# Patient Record
Sex: Male | Born: 1975 | Race: Black or African American | Hispanic: No | Smoking: Never smoker
Health system: Southern US, Community
[De-identification: ages and names within clinical notes are randomized; demographics above are authoritative.]

## PROBLEM LIST (undated history)

## (undated) DIAGNOSIS — E119 Type 2 diabetes mellitus without complications: Secondary | ICD-10-CM

## (undated) DIAGNOSIS — C61 Malignant neoplasm of prostate: Secondary | ICD-10-CM

## (undated) DIAGNOSIS — C801 Malignant (primary) neoplasm, unspecified: Secondary | ICD-10-CM

## (undated) DIAGNOSIS — R319 Hematuria, unspecified: Secondary | ICD-10-CM

## (undated) DIAGNOSIS — I10 Essential (primary) hypertension: Secondary | ICD-10-CM

## (undated) DIAGNOSIS — N189 Chronic kidney disease, unspecified: Secondary | ICD-10-CM

## (undated) DIAGNOSIS — D649 Anemia, unspecified: Secondary | ICD-10-CM

## (undated) DIAGNOSIS — Z87442 Personal history of urinary calculi: Secondary | ICD-10-CM

## (undated) DIAGNOSIS — T7840XA Allergy, unspecified, initial encounter: Secondary | ICD-10-CM

## (undated) DIAGNOSIS — G4730 Sleep apnea, unspecified: Secondary | ICD-10-CM

## (undated) HISTORY — DX: Malignant (primary) neoplasm, unspecified: C80.1

## (undated) HISTORY — DX: Hematuria, unspecified: R31.9

## (undated) HISTORY — DX: Essential (primary) hypertension: I10

## (undated) HISTORY — DX: Allergy, unspecified, initial encounter: T78.40XA

## (undated) HISTORY — DX: Type 2 diabetes mellitus without complications: E11.9

## (undated) HISTORY — PX: SMALL INTESTINE SURGERY: SHX150

## (undated) HISTORY — DX: Malignant neoplasm of prostate: C61

---

## 2003-08-11 HISTORY — PX: PARTIAL COLECTOMY: SHX5273

## 2009-01-21 ENCOUNTER — Ambulatory Visit: Payer: Self-pay | Admitting: Internal Medicine

## 2009-01-27 ENCOUNTER — Ambulatory Visit: Payer: Self-pay | Admitting: Family Medicine

## 2011-02-12 ENCOUNTER — Ambulatory Visit: Payer: Self-pay | Admitting: Internal Medicine

## 2012-02-10 ENCOUNTER — Ambulatory Visit: Payer: Self-pay

## 2012-02-10 LAB — URINALYSIS, COMPLETE
Glucose,UR: NEGATIVE mg/dL (ref 0–75)
Ketone: NEGATIVE
Nitrite: NEGATIVE
Protein: 300
Specific Gravity: >=1.03 (ref 1.003–1.030)
Squamous Epithelial: NONE SEEN

## 2012-02-10 LAB — CBC WITH DIFFERENTIAL/PLATELET
Basophil #: 0.1 10*3/uL (ref 0.0–0.1)
Basophil %: 1.1 %
Eosinophil #: 0.5 10*3/uL (ref 0.0–0.7)
HCT: 43 % (ref 40.0–52.0)
HGB: 14.3 g/dL (ref 13.0–18.0)
Lymphocyte #: 2.2 10*3/uL (ref 1.0–3.6)
Lymphocyte %: 20.1 %
MCV: 87 fL (ref 80–100)
Monocyte #: 1 x10 3/mm (ref 0.2–1.0)
Monocyte %: 8.6 %
Neutrophil %: 65.3 %
WBC: 11.2 10*3/uL — ABNORMAL HIGH (ref 3.8–10.6)

## 2012-02-10 LAB — BASIC METABOLIC PANEL
BUN: 11 mg/dL (ref 7–18)
Creatinine: 1.17 mg/dL (ref 0.60–1.30)
EGFR (Non-African Amer.): >60
Glucose: 127 mg/dL — ABNORMAL HIGH (ref 65–99)
Potassium: 3.8 mmol/L (ref 3.5–5.1)
Sodium: 143 mmol/L (ref 136–145)

## 2012-02-12 LAB — URINE CULTURE

## 2012-11-23 ENCOUNTER — Ambulatory Visit: Payer: Self-pay | Admitting: Family Medicine

## 2012-11-23 LAB — URINALYSIS, COMPLETE
Glucose,UR: NEGATIVE mg/dL (ref 0–75)
Ketone: NEGATIVE
Ph: 7.5 (ref 4.5–8.0)

## 2012-11-24 LAB — URINE CULTURE

## 2014-02-04 ENCOUNTER — Ambulatory Visit: Payer: Self-pay

## 2014-02-08 ENCOUNTER — Ambulatory Visit: Payer: Self-pay

## 2014-02-19 ENCOUNTER — Ambulatory Visit: Payer: Self-pay

## 2014-06-08 ENCOUNTER — Ambulatory Visit: Payer: Self-pay | Admitting: Internal Medicine

## 2014-06-09 ENCOUNTER — Ambulatory Visit: Payer: Self-pay | Admitting: Physician Assistant

## 2014-06-10 ENCOUNTER — Ambulatory Visit: Payer: Self-pay | Admitting: Internal Medicine

## 2014-09-10 LAB — HM DIABETES EYE EXAM

## 2014-09-25 LAB — BASIC METABOLIC PANEL
BUN: 13 mg/dL (ref 4–21)
CREATININE: 1 mg/dL (ref <=1.3)

## 2014-09-25 LAB — HEMOGLOBIN A1C: HEMOGLOBIN A1C: 6.8 % — AB (ref 4.0–6.0)

## 2014-10-09 ENCOUNTER — Ambulatory Visit
Admit: 2014-10-09 | Disposition: A | Discharge status: Home / Self Care | Payer: Self-pay | Attending: Internal Medicine | Admitting: Internal Medicine

## 2014-11-09 ENCOUNTER — Ambulatory Visit
Admit: 2014-11-09 | Disposition: A | Discharge status: Home / Self Care | Payer: Self-pay | Attending: Internal Medicine | Admitting: Internal Medicine

## 2014-12-04 DIAGNOSIS — I10 Essential (primary) hypertension: Secondary | ICD-10-CM

## 2014-12-04 DIAGNOSIS — N183 Chronic kidney disease, stage 3 (moderate): Secondary | ICD-10-CM

## 2014-12-04 DIAGNOSIS — N029 Recurrent and persistent hematuria with unspecified morphologic changes: Secondary | ICD-10-CM | POA: Insufficient documentation

## 2014-12-04 DIAGNOSIS — R319 Hematuria, unspecified: Secondary | ICD-10-CM

## 2014-12-04 DIAGNOSIS — E119 Type 2 diabetes mellitus without complications: Secondary | ICD-10-CM | POA: Insufficient documentation

## 2014-12-04 DIAGNOSIS — Z6835 Body mass index (BMI) 35.0-35.9, adult: Secondary | ICD-10-CM | POA: Insufficient documentation

## 2014-12-04 HISTORY — DX: Recurrent and persistent hematuria with unspecified morphologic changes: N02.9

## 2014-12-04 HISTORY — DX: Essential (primary) hypertension: I10

## 2015-02-06 ENCOUNTER — Encounter: Payer: Self-pay | Admitting: Internal Medicine

## 2015-03-25 ENCOUNTER — Other Ambulatory Visit: Payer: Self-pay | Admitting: Internal Medicine

## 2015-04-17 ENCOUNTER — Encounter: Payer: Self-pay | Admitting: Internal Medicine

## 2015-04-27 ENCOUNTER — Other Ambulatory Visit: Payer: Self-pay | Admitting: Internal Medicine

## 2015-05-14 ENCOUNTER — Ambulatory Visit: Payer: 59

## 2015-05-14 ENCOUNTER — Encounter: Payer: Self-pay | Admitting: Emergency Medicine

## 2015-05-14 ENCOUNTER — Ambulatory Visit
Admission: EM | Admit: 2015-05-14 | Discharge: 2015-05-14 | Disposition: A | Discharge status: Home / Self Care | Payer: 59 | Attending: Family Medicine | Admitting: Family Medicine

## 2015-05-14 DIAGNOSIS — M13121 Monoarthritis, not elsewhere classified, right elbow: Secondary | ICD-10-CM | POA: Diagnosis not present

## 2015-05-14 DIAGNOSIS — M009 Pyogenic arthritis, unspecified: Secondary | ICD-10-CM

## 2015-05-14 LAB — CBC WITH DIFFERENTIAL/PLATELET
Basophils Absolute: 0.2 10*3/uL — ABNORMAL HIGH (ref 0–0.1)
Basophils Relative: 2 %
Eosinophils Absolute: 0.4 10*3/uL (ref 0–0.7)
Eosinophils Relative: 3 %
HCT: 42.8 % (ref 40.0–52.0)
Hemoglobin: 14.6 g/dL (ref 13.0–18.0)
Lymphocytes Relative: 15 %
Lymphs Abs: 2.2 10*3/uL (ref 1.0–3.6)
MCH: 28.4 pg (ref 26.0–34.0)
MCHC: 34 g/dL (ref 32.0–36.0)
MCV: 83.5 fL (ref 80.0–100.0)
Monocytes Absolute: 1.6 10*3/uL — ABNORMAL HIGH (ref 0.2–1.0)
Monocytes Relative: 11 %
Neutro Abs: 10.3 10*3/uL — ABNORMAL HIGH (ref 1.4–6.5)
Neutrophils Relative %: 69 %
Platelets: 351 10*3/uL (ref 150–440)
RBC: 5.13 MIL/uL (ref 4.40–5.90)
RDW: 13.7 % (ref 11.5–14.5)
WBC: 14.7 10*3/uL — ABNORMAL HIGH (ref 3.8–10.6)

## 2015-05-14 LAB — SEDIMENTATION RATE: Sed Rate: 65 mm/hr — ABNORMAL HIGH (ref 0–15)

## 2015-05-14 MED ORDER — NAPROXEN 500 MG PO TABS: 500.0000 mg | ORAL_TABLET | Freq: Two times a day (BID) | ORAL | Status: DC

## 2015-05-14 NOTE — ED Notes (Signed)
Pt wioth right elbow pain x 3 days

## 2015-05-14 NOTE — ED Notes (Signed)
Pt with right elbow pain x x 3 days

## 2015-05-14 NOTE — ED Provider Notes (Signed)
CSN: [id number]     Arrival date & time 05/14/15  0940 History   First MD Initiated Contact with Patient 05/14/15 1002     Chief Complaint  Patient presents with  . Elbow Pain   (Consider location/radiation/quality/duration/timing/severity/associated sxs/prior Treatment) HPI  This a 39 year old male who presents with right (dominant) elbow pain that he indicates over entire elbow. He states that he noticed that he was unable to move his elbow through normal range of motion had previous episodes of tendinitis that took about a week to resolve and improve. He denies any recent change in activity that may account for his pain. He has no breaks in his skin that are evident. He has not had any chills or fever but has a mild elevation of his temperature to 99  today. He is obese and is a diabetic.       History reviewed. No pertinent past medical history. Past Surgical History  Procedure Laterality Date  . Partial colectomy  2005   Family History  Problem Relation Age of Onset  . Diabetes Mother   . Heart disease Mother   . Hypertension Mother   . Diabetes Maternal Grandmother   . Diabetes Maternal Grandfather    Social History  Substance Use Topics  . Smoking status: Never Smoker   . Smokeless tobacco: None  . Alcohol Use: None    Review of Systems  Constitutional: Positive for activity change.  Musculoskeletal: Positive for joint swelling and arthralgias.  All other systems reviewed and are negative.   Allergies  Review of patient's allergies indicates no known allergies.  Home Medications   Prior to Admission medications   Medication Sig Start Date End Date Taking? Authorizing Provider  amLODipine (NORVASC) 10 MG tablet Take 1 tablet by mouth daily. 09/25/14   Historical Provider, MD  atorvastatin (LIPITOR) 10 MG tablet Take 1 tablet by mouth at bedtime. 09/25/14   Historical Provider, MD  hydrochlorothiazide (HYDRODIURIL) 25 MG tablet Take 2 tablets by mouth daily.  07/22/14   Historical Provider, MD  ibuprofen (ADVIL,MOTRIN) 800 MG tablet Take 1 tablet by mouth 3 (three) times daily as needed.    Historical Provider, MD  irbesartan (AVAPRO) 300 MG tablet Take 1 tablet by mouth daily. 09/25/14   Historical Provider, MD  metFORMIN (GLUCOPHAGE) 500 MG tablet TAKE ONE TABLET BY MOUTH ONCE DAILY 04/28/15   Glean Hess, MD  naproxen (NAPROSYN) 500 MG tablet Take 1 tablet (500 mg total) by mouth 2 (two) times daily with a meal. 05/14/15   Lorin Picket, PA-C   Meds Ordered and Administered this Visit  Medications - No data to display  BP 146/80 mmHg  Pulse 93  Temp(Src) 99.2 F (37.3 C) (Tympanic)  Resp 18  Ht 5' 11.5" (1.816 m)  Wt 412 lb (186.882 kg)  BMI 56.67 kg/m2  SpO2 100% No data found.   Physical Exam  Constitutional: He is oriented to person, place, and time. He appears well-developed and well-nourished.  HENT:  Head: Normocephalic and atraumatic.  Eyes: Pupils are equal, round, and reactive to light.  Neck: Neck supple.  Musculoskeletal: He exhibits edema and tenderness.  Examination of the right elbow shows swelling comparison to the left. Range of motion is very limited from -45 extension to 90 of flexion but comfortable within that range. Pronation supination are full. There is warmth to palpation over the elbow mostly posterolateral. There appears to be tenderness over the lateral and posterior elbow but not severe. Lifting weight  with the palm in pronation and resisted wrist extension do not reproduce his pain.  Neurological: He is alert and oriented to person, place, and time.  Skin: Skin is warm and dry. No rash noted.  Psychiatric: He has a normal mood and affect. His behavior is normal. Judgment and thought content normal.  Nursing note and vitals reviewed.   ED Course  Procedures (including critical care time)  Labs Review Labs Reviewed  CBC WITH DIFFERENTIAL/PLATELET - Abnormal; Notable for the following:    WBC  14.7 (*)    Neutro Abs 10.3 (*)    Monocytes Absolute 1.6 (*)    Basophils Absolute 0.2 (*)    All other components within normal limits  SEDIMENTATION RATE - Abnormal; Notable for the following:    Sed Rate 65 (*)    All other components within normal limits    Imaging Review Dg Elbow Complete Right  05/14/2015   CLINICAL DATA:  Elbow pain and limited range of motion for 2 days. No known injury.  EXAM: RIGHT ELBOW - COMPLETE 3+ VIEW  COMPARISON:  None.  FINDINGS: There is no evidence of fracture, dislocation, or joint effusion. There is no evidence of arthropathy or other focal bone abnormality. Mild soft tissue swelling is seen along the posterior and ulnar aspect of the elbow joint.  IMPRESSION: Mild soft tissue swelling. No osseous abnormality or elbow joint effusion identified.   Electronically Signed   By: Earle Gell M.D.   On: 05/14/2015 10:50     Visual Acuity Review  Right Eye Distance:   Left Eye Distance:   Bilateral Distance:    Right Eye Near:   Left Eye Near:    Bilateral Near:         MDM   1. Inflammatory monoarthritis of right elbow   2. Pyogenic arthritis of right elbow, due to unspecified organism [facility])    Discharge Medication List as of 05/14/2015 11:50 AM    START taking these medications   Details  naproxen (NAPROSYN) 500 MG tablet Take 1 tablet (500 mg total) by mouth 2 (two) times daily with a meal., Starting 05/14/2015, Until Discontinued, Print      Plan: 1. Test/x-ray results and diagnosis reviewed with patient 2. rx as per orders; risks, benefits, potential side effects reviewed with patient 3. Recommend supportive treatment with rest,elevation. 4. F/u Dr. Tamala Julian at [facility] tomorrow at 9:00 AM.  I have told patient that from my examination and from the laboratory studies he quite possibly may have a septic arthritis of the right elbow. Unable to find another source for his elevated white count and low-grade temperature. I  recommended he be seen by orthopedic surgeon who may decide to aspirate the joint for fluid. Therefore I will not put him on any antibiotics pending the study. I will give him some Naprosyn for tonight advised him to elevate and I and ice the elbow. He was given a copy of his x-ray and a copy of the laboratory performed here today. I will also attempt to fax a note over to Dr. Tamala Julian for the exam tomorrow.   Lorin Picket, PA-C 05/14/15 1201

## 2015-05-29 ENCOUNTER — Other Ambulatory Visit: Payer: Self-pay | Admitting: Internal Medicine

## 2015-07-16 ENCOUNTER — Encounter: Payer: Self-pay | Admitting: Internal Medicine

## 2015-07-16 ENCOUNTER — Ambulatory Visit (INDEPENDENT_AMBULATORY_CARE_PROVIDER_SITE_OTHER): Payer: 59 | Admitting: Internal Medicine

## 2015-07-16 VITALS — BP 134/86 | HR 92 | Ht 71.5 in | Wt >= 6400 oz

## 2015-07-16 DIAGNOSIS — Z125 Encounter for screening for malignant neoplasm of prostate: Secondary | ICD-10-CM | POA: Diagnosis not present

## 2015-07-16 DIAGNOSIS — Z114 Encounter for screening for human immunodeficiency virus [HIV]: Secondary | ICD-10-CM

## 2015-07-16 DIAGNOSIS — I10 Essential (primary) hypertension: Secondary | ICD-10-CM

## 2015-07-16 DIAGNOSIS — E119 Type 2 diabetes mellitus without complications: Secondary | ICD-10-CM | POA: Diagnosis not present

## 2015-07-16 DIAGNOSIS — E782 Mixed hyperlipidemia: Secondary | ICD-10-CM | POA: Insufficient documentation

## 2015-07-16 DIAGNOSIS — N029 Recurrent and persistent hematuria with unspecified morphologic changes: Secondary | ICD-10-CM

## 2015-07-16 DIAGNOSIS — E785 Hyperlipidemia, unspecified: Secondary | ICD-10-CM

## 2015-07-16 DIAGNOSIS — R319 Hematuria, unspecified: Secondary | ICD-10-CM

## 2015-07-16 DIAGNOSIS — E1169 Type 2 diabetes mellitus with other specified complication: Secondary | ICD-10-CM | POA: Diagnosis not present

## 2015-07-16 DIAGNOSIS — E118 Type 2 diabetes mellitus with unspecified complications: Secondary | ICD-10-CM | POA: Insufficient documentation

## 2015-07-16 DIAGNOSIS — E1122 Type 2 diabetes mellitus with diabetic chronic kidney disease: Secondary | ICD-10-CM | POA: Insufficient documentation

## 2015-07-16 DIAGNOSIS — Z0000 Encounter for general adult medical examination without abnormal findings: Secondary | ICD-10-CM | POA: Diagnosis not present

## 2015-07-16 DIAGNOSIS — N183 Chronic kidney disease, stage 3 (moderate): Secondary | ICD-10-CM

## 2015-07-16 DIAGNOSIS — I129 Hypertensive chronic kidney disease with stage 1 through stage 4 chronic kidney disease, or unspecified chronic kidney disease: Secondary | ICD-10-CM

## 2015-07-16 HISTORY — DX: Type 2 diabetes mellitus without complications: E11.9

## 2015-07-16 LAB — POCT URINALYSIS DIPSTICK
Bilirubin, UA: NEGATIVE
GLUCOSE UA: NEGATIVE
KETONES UA: NEGATIVE
Leukocytes, UA: NEGATIVE
NITRITE UA: NEGATIVE
SPEC GRAV UA: 1.02
Urobilinogen, UA: 0.2
pH, UA: 5

## 2015-07-16 NOTE — Progress Notes (Signed)
Date:  07/16/2015   Name:  Benjamin Stewart   DOB:  [birth year]   MRN:  [id number]   Chief Complaint: Annual Exam; Hypertension; and Diabetes Benjamin Stewart is a 38 y.o. male who presents today for his Complete Annual Exam. He feels fairly well. He reports exercising none. He reports he is sleeping well. He awakes feeling refreshed - has adapted well to third shift work.  Hypertension This is a chronic problem. The current episode started more than 1 year ago. The problem has been waxing and waning since onset. The problem is uncontrolled. Pertinent negatives include no chest pain, headaches, palpitations or shortness of breath. Risk factors for coronary artery disease include diabetes mellitus. Past treatments include angiotensin blockers, calcium channel blockers and diuretics. There is no history of retinopathy.  Diabetes He presents for his follow-up diabetic visit. He has type 2 diabetes mellitus. Pertinent negatives for hypoglycemia include no dizziness or headaches. Pertinent negatives for diabetes include no chest pain, no fatigue, no foot paresthesias, no foot ulcerations, no polydipsia and no polyuria. Symptoms are stable. Pertinent negatives for diabetic complications include no nephropathy, peripheral neuropathy or retinopathy. Risk factors for coronary artery disease include diabetes mellitus, dyslipidemia, hypertension, male sex and obesity. Current diabetic treatment includes oral agent (monotherapy). His breakfast blood glucose is taken between 6-7 am. His breakfast blood glucose range is generally 130-140 mg/dl. (Works third shift) Eye exam is current.  Hyperlipidemia This is a chronic problem. The current episode started more than 1 year ago. Exacerbating diseases include diabetes and obesity. Pertinent negatives include no chest pain, myalgias or shortness of breath. Current antihyperlipidemic treatment includes statins. The current treatment provides significant improvement of lipids.  Compliance problems include adherence to exercise and adherence to diet.      Review of Systems  Constitutional: Negative for chills, diaphoresis and fatigue.  HENT: Negative for hearing loss, tinnitus and trouble swallowing.   Eyes: Negative for visual disturbance.  Respiratory: Negative for chest tightness, shortness of breath and wheezing.   Cardiovascular: Positive for leg swelling. Negative for chest pain and palpitations.  Gastrointestinal: Negative for abdominal pain, constipation and blood in stool.  Endocrine: Negative for polydipsia and polyuria.  Genitourinary: Positive for hematuria. Negative for dysuria, urgency, frequency and discharge.  Musculoskeletal: Positive for arthralgias (recent elbow bursitis). Negative for myalgias, joint swelling and gait problem.  Skin: Negative for rash.  Neurological: Negative for dizziness and headaches.  Psychiatric/Behavioral: Negative for sleep disturbance and dysphoric mood.    Patient Active Problem List   Diagnosis Date Noted  . Controlled type 2 diabetes mellitus without complication, without long-term current use of insulin ([place]) 07/16/2015  . Hyperlipidemia associated with type 2 diabetes mellitus ([place]) 07/16/2015  . Acute gouty arthropathy 12/04/2014  . Benign essential hematuria 12/04/2014  . Essential (primary) hypertension 12/04/2014  . Extreme obesity ([place]) 12/04/2014  . Impaired renal function 12/04/2014    Prior to Admission medications   Medication Sig Start Date End Date Taking? Authorizing Provider  amLODipine (NORVASC) 10 MG tablet TAKE ONE TABLET BY MOUTH ONCE DAILY 05/29/15   Glean Hess, MD  atorvastatin (LIPITOR) 10 MG tablet Take 1 tablet by mouth at bedtime. 09/25/14   Historical Provider, MD  hydrochlorothiazide (HYDRODIURIL) 25 MG tablet Take 2 tablets by mouth daily. 07/22/14   Historical Provider, MD  ibuprofen (ADVIL,MOTRIN) 800 MG tablet Take 1 tablet by mouth 3 (three) times daily as needed.     Historical Provider, MD  irbesartan (AVAPRO) 300 MG tablet  TAKE ONE TABLET BY MOUTH ONCE DAILY 05/29/15   Glean Hess, MD  meloxicam [facility]) 15 MG tablet  05/15/15   Historical Provider, MD  metFORMIN (GLUCOPHAGE) 500 MG tablet TAKE ONE TABLET BY MOUTH ONCE DAILY 04/28/15   Glean Hess, MD  naproxen (NAPROSYN) 500 MG tablet Take 1 tablet (500 mg total) by mouth 2 (two) times daily with a meal. 05/14/15   Lorin Picket, PA-C    No Known Allergies  Past Surgical History  Procedure Laterality Date  . Partial colectomy  2005    Social History  Substance Use Topics  . Smoking status: Never Smoker   . Smokeless tobacco: None  . Alcohol Use: None     Medication list has been reviewed and updated.  Physical Exam  Constitutional: He is oriented to person, place, and time. He appears well-developed and well-nourished.  HENT:  Head: Normocephalic.  Right Ear: Tympanic membrane, external ear and ear canal normal.  Left Ear: Tympanic membrane, external ear and ear canal normal.  Nose: Nose normal.  Mouth/Throat: Uvula is midline and oropharynx is clear and moist. No tonsillar abscesses.  Eyes: Conjunctivae and EOM are normal. Pupils are equal, round, and reactive to light.  Neck: Normal range of motion. Neck supple. Carotid bruit is not present. No thyromegaly present.  Cardiovascular: Normal rate, regular rhythm, normal heart sounds and intact distal pulses.   Pulmonary/Chest: Effort normal and breath sounds normal. He has no wheezes. Right breast exhibits no mass. Left breast exhibits no mass.  Abdominal: Soft. Normal appearance and bowel sounds are normal. There is no hepatosplenomegaly. There is no tenderness.  Musculoskeletal: Normal range of motion.       Right elbow: He exhibits no swelling. No tenderness found.  Lymphadenopathy:    He has no cervical adenopathy.  Neurological: He is alert and oriented to person, place, and time. He has normal reflexes. No sensory  deficit.  Foot exam normal - intact skin, nails, pulses and sensation   Skin: Skin is warm, dry and intact.  Psychiatric: He has a normal mood and affect. His speech is normal and behavior is normal. Judgment and thought content normal.  Nursing note and vitals reviewed.   BP 134/86 mmHg  Pulse 92  Ht 5' 11.5" (1.816 m)  Wt 414 lb 12.8 oz (188.152 kg)  BMI 57.05 kg/m2  Assessment and Plan: 1. Annual physical exam Normal exam except for weight Discussed exercise and healthy eating habits - POCT urinalysis dipstick  2. Essential (primary) hypertension Controlled - CBC with Differential/Platelet - Comprehensive metabolic panel  3. Controlled type 2 diabetes mellitus without complication, without long-term current use of insulin (HCC) Adjust dose if needed - Microalbumin / creatinine urine ratio - TSH - Hemoglobin A1c  4. Morbid obesity, unspecified obesity type [facility]) Patient has had diabetic education; he is aware of eating habits that can be approved upon  5. Benign essential hematuria Full workup negative in the past Current microscopic exam positive for intact RBCs, and amorphous material; no bacteria  6. Hyperlipidemia associated with type 2 diabetes mellitus ([place]) On appropriate statin therapy - Lipid panel  7. Prostate cancer screening DRE deferred due to lack of symptoms - PSA  8. Encounter for screening for HIV - HIV antibody   Halina Maidens, MD [facility]  07/16/2015

## 2015-07-17 ENCOUNTER — Other Ambulatory Visit: Payer: Self-pay | Admitting: Internal Medicine

## 2015-07-17 DIAGNOSIS — R972 Elevated prostate specific antigen [PSA]: Secondary | ICD-10-CM

## 2015-07-17 LAB — CBC WITH DIFFERENTIAL/PLATELET
BASOS ABS: 0.1 10*3/uL (ref 0.0–0.2)
Basos: 1 %
EOS (ABSOLUTE): 0.3 10*3/uL (ref 0.0–0.4)
EOS: 2 %
HEMOGLOBIN: 13.4 g/dL (ref 12.6–17.7)
Hematocrit: 40.9 % (ref 37.5–51.0)
IMMATURE GRANULOCYTES: 0 %
Immature Grans (Abs): 0 10*3/uL (ref 0.0–0.1)
LYMPHS ABS: 2.5 10*3/uL (ref 0.7–3.1)
LYMPHS: 19 %
MCH: 28.3 pg (ref 26.6–33.0)
MCHC: 32.8 g/dL (ref 31.5–35.7)
MCV: 87 fL (ref 79–97)
MONOCYTES: 9 %
Monocytes Absolute: 1.1 10*3/uL — ABNORMAL HIGH (ref 0.1–0.9)
NEUTROS PCT: 69 %
Neutrophils Absolute: 9.1 10*3/uL — ABNORMAL HIGH (ref 1.4–7.0)
Platelets: 373 10*3/uL (ref 150–379)
RBC: 4.73 x10E6/uL (ref 4.14–5.80)
RDW: 14.1 % (ref 12.3–15.4)
WBC: 13.1 10*3/uL — AB (ref 3.4–10.8)

## 2015-07-17 LAB — COMPREHENSIVE METABOLIC PANEL
ALK PHOS: 59 IU/L (ref 39–117)
ALT: 16 IU/L (ref 0–44)
AST: 19 IU/L (ref 0–40)
Albumin/Globulin Ratio: 1.1 (ref 1.1–2.5)
Albumin: 3.5 g/dL (ref 3.5–5.5)
BUN/Creatinine Ratio: 14 (ref 9–20)
BUN: 27 mg/dL — ABNORMAL HIGH (ref 6–24)
Bilirubin Total: 0.7 mg/dL (ref 0.0–1.2)
CALCIUM: 9.1 mg/dL (ref 8.7–10.2)
CHLORIDE: 98 mmol/L (ref 97–106)
CO2: 26 mmol/L (ref 18–29)
CREATININE: 1.89 mg/dL — AB (ref 0.76–1.27)
GFR calc Af Amer: 50 mL/min/{1.73_m2} — ABNORMAL LOW (ref >59)
GFR calc non Af Amer: 43 mL/min/{1.73_m2} — ABNORMAL LOW (ref >59)
GLUCOSE: 118 mg/dL — AB (ref 65–99)
Globulin, Total: 3.2 g/dL (ref 1.5–4.5)
POTASSIUM: 4.1 mmol/L (ref 3.5–5.2)
SODIUM: 140 mmol/L (ref 136–144)
Total Protein: 6.7 g/dL (ref 6.0–8.5)

## 2015-07-17 LAB — MICROALBUMIN / CREATININE URINE RATIO
Creatinine, Urine: 209.7 mg/dL
MICROALB/CREAT RATIO: 329.5 mg/g{creat} — AB (ref 0.0–30.0)
Microalbumin, Urine: 691 ug/mL

## 2015-07-17 LAB — LIPID PANEL
CHOLESTEROL TOTAL: 138 mg/dL (ref 100–199)
Chol/HDL Ratio: 3.9 ratio units (ref 0.0–5.0)
HDL: 35 mg/dL — ABNORMAL LOW (ref >39)
LDL Calculated: 81 mg/dL (ref 0–99)
TRIGLYCERIDES: 110 mg/dL (ref 0–149)
VLDL Cholesterol Cal: 22 mg/dL (ref 5–40)

## 2015-07-17 LAB — TSH: TSH: 2.48 u[IU]/mL (ref 0.450–4.500)

## 2015-07-17 LAB — PSA: PROSTATE SPECIFIC AG, SERUM: 7.6 ng/mL — AB (ref 0.0–4.0)

## 2015-07-17 LAB — HEMOGLOBIN A1C
ESTIMATED AVERAGE GLUCOSE: 169 mg/dL
HEMOGLOBIN A1C: 7.5 % — AB (ref 4.8–5.6)

## 2015-07-17 LAB — HIV ANTIBODY (ROUTINE TESTING W REFLEX): HIV Screen 4th Generation wRfx: NONREACTIVE

## 2015-07-17 MED ORDER — CIPROFLOXACIN HCL 250 MG PO TABS: 250.0000 mg | ORAL_TABLET | Freq: Two times a day (BID) | ORAL | Status: DC

## 2015-08-06 ENCOUNTER — Other Ambulatory Visit: Payer: Self-pay

## 2015-08-06 ENCOUNTER — Other Ambulatory Visit: Payer: Self-pay | Admitting: Internal Medicine

## 2015-08-06 DIAGNOSIS — R972 Elevated prostate specific antigen [PSA]: Secondary | ICD-10-CM

## 2015-08-07 ENCOUNTER — Other Ambulatory Visit: Payer: Self-pay | Admitting: Internal Medicine

## 2015-08-07 DIAGNOSIS — R972 Elevated prostate specific antigen [PSA]: Secondary | ICD-10-CM

## 2015-08-07 LAB — PSA: PROSTATE SPECIFIC AG, SERUM: 7 ng/mL — AB (ref 0.0–4.0)

## 2015-08-20 ENCOUNTER — Ambulatory Visit: Payer: 59

## 2015-08-21 ENCOUNTER — Encounter: Payer: Self-pay | Admitting: Urology

## 2015-08-21 ENCOUNTER — Ambulatory Visit
Admission: EM | Admit: 2015-08-21 | Discharge: 2015-08-21 | Disposition: A | Discharge status: Home / Self Care | Payer: 59 | Attending: Family Medicine | Admitting: Family Medicine

## 2015-08-21 ENCOUNTER — Encounter: Payer: Self-pay | Admitting: Emergency Medicine

## 2015-08-21 ENCOUNTER — Ambulatory Visit (INDEPENDENT_AMBULATORY_CARE_PROVIDER_SITE_OTHER): Payer: 59 | Admitting: Urology

## 2015-08-21 VITALS — BP 166/89 | HR 111 | Ht 72.0 in | Wt >= 6400 oz

## 2015-08-21 DIAGNOSIS — R3129 Other microscopic hematuria: Secondary | ICD-10-CM | POA: Diagnosis not present

## 2015-08-21 DIAGNOSIS — M10072 Idiopathic gout, left ankle and foot: Secondary | ICD-10-CM

## 2015-08-21 DIAGNOSIS — R972 Elevated prostate specific antigen [PSA]: Secondary | ICD-10-CM

## 2015-08-21 LAB — MICROSCOPIC EXAMINATION
Epithelial Cells (non renal): NONE SEEN /hpf (ref 0–10)
RBC, UA: >30 /hpf — ABNORMAL HIGH (ref 0–?)

## 2015-08-21 LAB — URINALYSIS, COMPLETE
BILIRUBIN UA: NEGATIVE
GLUCOSE, UA: NEGATIVE
Ketones, UA: NEGATIVE
LEUKOCYTES UA: NEGATIVE
Nitrite, UA: NEGATIVE
PH UA: 6 (ref 5.0–7.5)
Specific Gravity, UA: 1.025 (ref 1.005–1.030)
Urobilinogen, Ur: 0.2 mg/dL (ref 0.2–1.0)

## 2015-08-21 MED ORDER — HYDROCODONE-ACETAMINOPHEN 5-325 MG PO TABS: ORAL_TABLET | ORAL | Status: DC

## 2015-08-21 MED ORDER — COLCHICINE 0.6 MG PO TABS
ORAL_TABLET | ORAL | Status: DC
Start: 2015-08-21 — End: 2015-08-23

## 2015-08-21 MED ORDER — CIPROFLOXACIN HCL 250 MG PO TABS: 250.0000 mg | ORAL_TABLET | Freq: Two times a day (BID) | ORAL | Status: DC

## 2015-08-21 NOTE — Progress Notes (Signed)
08/21/2015 9:21 AM   Benjamin Stewart [birth year] [id number]  Referring provider: Glean Hess, MD [address] [place], [place] [address]  Chief Complaint  Patient presents with  . New Patient (Initial Visit)    elevated PSA    HPI: The patient is a 40 year old gentleman presents for evaluation for an elevated PSA.   1. Elevated PSA His PSA one month ago was 7.6. Repeat PSA was 7.0 two weeks ago.  He denies any urinary symptoms at this time. He has had no recent urinary tract infections. He was given a week of antibiotics in between his PSA drawings. He denies dysuria, fever, chills, or incomplete emptying.  2. Microscopic Hematuria The patient has a history of microscopic hematuria. He saw urologist in 2013 and had a cystoscopy and CT at that time which was negative.  3.  History of nephrolithiasis The patient passed a kidney stone in 2001. This was his only stone. He is having no symptoms of a kidney stone at this time.  PMH: Past Medical History  Diagnosis Date  . Essential (primary) hypertension 12/04/2014  . Controlled type 2 diabetes mellitus without complication, without long-term current use of insulin ([place]) 07/16/2015  . Benign essential hematuria 12/04/2014    microscopic hematuria evaluation with CT and cysto - treated for 2 months with bactrim DS; no further f/u planned unless hematuria recurs.     Surgical History: Past Surgical History  Procedure Laterality Date  . Partial colectomy  2005    Home Medications:    Medication List       This list is accurate as of: 08/21/15  9:21 AM.  Always use your most recent med list.               amLODipine 10 MG tablet  Commonly known as:  NORVASC  TAKE ONE TABLET BY MOUTH ONCE DAILY     amoxicillin-clavulanate 500-125 MG tablet  Commonly known as:  AUGMENTIN  Reported on 08/21/2015     atorvastatin 10 MG tablet  Commonly known as:  LIPITOR  Take 1 tablet by mouth at bedtime.     ciprofloxacin 250 MG tablet  Commonly known as:  CIPRO  Take 1 tablet (250 mg total) by mouth 2 (two) times daily.     hydrochlorothiazide 25 MG tablet  Commonly known as:  HYDRODIURIL  TAKE TWO TABLETS BY MOUTH ONCE DAILY     ibuprofen 800 MG tablet  Commonly known as:  ADVIL,MOTRIN  Take 1 tablet by mouth 3 (three) times daily as needed. Reported on 08/21/2015     irbesartan 300 MG tablet  Commonly known as:  AVAPRO  TAKE ONE TABLET BY MOUTH ONCE DAILY     metFORMIN 500 MG tablet  Commonly known as:  GLUCOPHAGE  TAKE ONE TABLET BY MOUTH ONCE DAILY        Allergies: No Known Allergies  Family History: Family History  Problem Relation Age of Onset  . Diabetes Mother   . Heart disease Mother   . Hypertension Mother   . Diabetes Maternal Grandmother   . Diabetes Maternal Grandfather   . Prostate cancer Maternal Uncle     Social History:  reports that he has never smoked. He does not have any smokeless tobacco history on file. He reports that he does not drink alcohol or use illicit drugs.  ROS: UROLOGY Frequent Urination?: Yes Hard to postpone urination?: No Burning/pain with urination?: No Get up at night to urinate?: Yes Leakage  of urine?: No Urine stream starts and stops?: No Trouble starting stream?: No Do you have to strain to urinate?: No Blood in urine?: No Urinary tract infection?: No Sexually transmitted disease?: No Injury to kidneys or bladder?: No Painful intercourse?: No Weak stream?: No Erection problems?: No Penile pain?: No  Gastrointestinal Nausea?: No Vomiting?: No Indigestion/heartburn?: No Diarrhea?: No Constipation?: No  Constitutional Fever: No Night sweats?: No Weight loss?: No Fatigue?: No  Skin Skin rash/lesions?: No Itching?: No  Eyes Blurred vision?: No Double vision?: No  Ears/Nose/Throat Sore throat?: No Sinus problems?: No  Hematologic/Lymphatic Swollen glands?: No Easy bruising?: No  Cardiovascular Leg  swelling?: No Chest pain?: No  Respiratory Cough?: No Shortness of breath?: No  Endocrine Excessive thirst?: No  Musculoskeletal Back pain?: No Joint pain?: No  Neurological Headaches?: No Dizziness?: No  Psychologic Depression?: No Anxiety?: No  Physical Exam: BP 166/89 mmHg  Pulse 111  Ht 6' (1.829 m)  Wt 409 lb 14.4 oz (185.929 kg)  BMI 55.58 kg/m2  Constitutional:  Alert and oriented, No acute distress. HEENT: [place] AT, moist mucus membranes.  Trachea midline, no masses. Cardiovascular: No clubbing, cyanosis, or edema. Respiratory: Normal respiratory effort, no increased work of breathing. GI: Abdomen is soft, nontender, nondistended, no abdominal masses GU: No CVA tenderness. Buried phallus. Testicles descended equally bilaterally. No masses. DRE: Prostate difficult to palpate due to body habitus. Skin: No rashes, bruises or suspicious lesions. Lymph: No cervical or inguinal adenopathy. Neurologic: Grossly intact, no focal deficits, moving all 4 extremities. Psychiatric: Normal mood and affect.  Laboratory Data: Lab Results  Component Value Date   WBC 13.1* 07/16/2015   HGB 14.6 05/14/2015   HCT 40.9 07/16/2015   MCV 87 07/16/2015   PLT 373 07/16/2015    Lab Results  Component Value Date   CREATININE 1.89* 07/16/2015    No results found for: PSA  No results found for: TESTOSTERONE  Lab Results  Component Value Date   HGBA1C 7.5* 07/16/2015    Urinalysis    Component Value Date/Time   COLORURINE PINK 11/23/2012 0823   APPEARANCEUR CLOUDY 11/23/2012 0823   LABSPEC 1.010 11/23/2012 0823   PHURINE 7.5 11/23/2012 0823   GLUCOSEU NEGATIVE 11/23/2012 0823   HGBUR 3+ 11/23/2012 0823   BILIRUBINUR neg 07/16/2015 0846   BILIRUBINUR NEGATIVE 11/23/2012 0823   KETONESUR NEGATIVE 11/23/2012 0823   PROTEINUR moderate 07/16/2015 0846   PROTEINUR 100 mg/dL 11/23/2012 0823   UROBILINOGEN 0.2 07/16/2015 0846   NITRITE neg 07/16/2015 0846   NITRITE  NEGATIVE 11/23/2012 0823   LEUKOCYTESUR Negative 07/16/2015 0846   LEUKOCYTESUR 1+ 11/23/2012 0823      Assessment & Plan:    The patient has an elevated PSA on 2 separate lab drawings. I discussed with the patient that he is rather young to have prostate cancer, though it is certainly possible. I informed him that the youngest patient that I have personally seen with prostate cancer was in his mid 72s. I discussed the next step in the workup of an elevated PSA would be a prostate biopsy. However, I would like to first treat him with a long course of antibiotics and repeat his PSA after this prior to pursuing a prostate biopsy. I will start him on a month of ciprofloxacin 250 mg twice a day (creatinine: 1.89). We'll have him follow-up in 6-8 weeks with a PSA prior.  1. Elevated PSA -Cipro 250 mg BID for one month -Recheck PSA in 6-8 weeks -follow up after PSA  2. Hematuria -Work up negative a few years ago. He does have hematuria on today's urinalysis. Will continue to monitor.  3.  History of nephrolithiasis -No evidence of disease.  Return in about 2 months (around 10/19/2015) for with PSA one week prior.  Nickie Retort, MD  [facility] [address] [place], [place] [address] [phone]

## 2015-08-21 NOTE — ED Notes (Signed)
Patient c/o pain in his left 1st toe since yesterday. Patient reports history of Gout.  Patient denies injury.

## 2015-08-23 ENCOUNTER — Encounter: Payer: Self-pay | Admitting: Internal Medicine

## 2015-08-23 ENCOUNTER — Ambulatory Visit (INDEPENDENT_AMBULATORY_CARE_PROVIDER_SITE_OTHER): Payer: 59 | Admitting: Internal Medicine

## 2015-08-23 VITALS — BP 124/84 | HR 74 | Ht 72.0 in | Wt >= 6400 oz

## 2015-08-23 DIAGNOSIS — R972 Elevated prostate specific antigen [PSA]: Secondary | ICD-10-CM

## 2015-08-23 DIAGNOSIS — M109 Gout, unspecified: Secondary | ICD-10-CM | POA: Diagnosis not present

## 2015-08-23 DIAGNOSIS — C61 Malignant neoplasm of prostate: Secondary | ICD-10-CM | POA: Insufficient documentation

## 2015-08-23 MED ORDER — COLCHICINE 0.6 MG PO TABS: ORAL_TABLET | ORAL | Status: DC

## 2015-08-23 NOTE — Progress Notes (Signed)
Date:  08/23/2015   Name:  Benjamin Stewart   DOB:  01/24/1976   MRN:  [id number]   Chief Complaint: Gout HPI Seen yesterday in urgent care for a gout flare. No blood work was done. He was prescribed colchicine and hydrocodone for pain.  His last uric acid level 1 year ago was 9.  He feels much better with still painful.  He can now get his shoe on. He needs a work note for yesterday and today.  Elevated PSA - seen by urology. They recommended a prolonged course of Cipro for 30 days and then a PSA recheck.  Review of Systems  Constitutional: Negative for fever and chills.  Respiratory: Negative for chest tightness and shortness of breath.   Cardiovascular: Negative for chest pain and palpitations.  Gastrointestinal: Negative for abdominal pain and blood in stool.  Musculoskeletal: Positive for arthralgias and gait problem.    Patient Active Problem List   Diagnosis Date Noted  . Elevated PSA, less than 10 ng/ml 08/23/2015  . Controlled type 2 diabetes mellitus without complication, without long-term current use of insulin ([place]) 07/16/2015  . Hyperlipidemia associated with type 2 diabetes mellitus ([place]) 07/16/2015  . Acute gouty arthropathy 12/04/2014  . Benign essential hematuria 12/04/2014  . Essential (primary) hypertension 12/04/2014  . Extreme obesity ([place]) 12/04/2014  . Impaired renal function 12/04/2014    Prior to Admission medications   Medication Sig Start Date End Date Taking? Authorizing Provider  amLODipine (NORVASC) 10 MG tablet TAKE ONE TABLET BY MOUTH ONCE DAILY 08/06/15  Yes Glean Hess, MD  amoxicillin-clavulanate (AUGMENTIN) 500-125 MG tablet Reported on 08/21/2015 01/22/09  Yes Historical Provider, MD  atorvastatin (LIPITOR) 10 MG tablet Take 1 tablet by mouth at bedtime. 09/25/14  Yes Historical Provider, MD  ciprofloxacin (CIPRO) 250 MG tablet Take 1 tablet (250 mg total) by mouth 2 (two) times daily. 08/21/15  Yes Nickie Retort, MD  colchicine 0.6 MG  tablet Take 2 tablets po once today; may repeat in 1 hour (max of 1.8mg  in 24 hours); then 1 tab po qd 08/21/15  Yes Norval Gable, MD  hydrochlorothiazide (HYDRODIURIL) 25 MG tablet TAKE TWO TABLETS BY MOUTH ONCE DAILY 08/06/15  Yes Glean Hess, MD  ibuprofen (ADVIL,MOTRIN) 800 MG tablet Take 1 tablet by mouth 3 (three) times daily as needed. Reported on 08/21/2015   Yes Historical Provider, MD  irbesartan (AVAPRO) 300 MG tablet TAKE ONE TABLET BY MOUTH ONCE DAILY 08/06/15  Yes Glean Hess, MD  metFORMIN (GLUCOPHAGE) 500 MG tablet TAKE ONE TABLET BY MOUTH ONCE DAILY 04/28/15  Yes Glean Hess, MD  ciprofloxacin (CIPRO) 250 MG tablet Take 1 tablet (250 mg total) by mouth 2 (two) times daily. Patient not taking: Reported on 08/21/2015 07/17/15   Glean Hess, MD  HYDROcodone-acetaminophen (NORCO/VICODIN) 5-325 MG tablet 1-2 tabs po q 8 hours prn pain 08/21/15   Norval Gable, MD    Allergies  Allergen Reactions  . Percocet [Oxycodone-Acetaminophen] Nausea And Vomiting    Past Surgical History  Procedure Laterality Date  . Partial colectomy  2005    Social History  Substance Use Topics  . Smoking status: Never Smoker   . Smokeless tobacco: None  . Alcohol Use: No   This SmartLink has not been configured with any valid records.   Lab Results  Component Value Date   HGBA1C 7.5* 07/16/2015    Medication list has been reviewed and updated.   Physical Exam  Constitutional:  He is oriented to person, place, and time. He appears well-developed and well-nourished.  Cardiovascular: Normal rate, regular rhythm and normal heart sounds.   Pulmonary/Chest: Effort normal and breath sounds normal.  Musculoskeletal: He exhibits edema and tenderness (left great toe).  Neurological: He is alert and oriented to person, place, and time.    BP 124/84 mmHg  Pulse 74  Ht 6' (1.829 m)  Wt 405 lb 12.8 oz (184.07 kg)  BMI 55.02 kg/m2  Assessment and Plan: 1. Acute gouty  arthropathy Last flare one year ago Take colchicine bid as needed Avoid high purine foods (discussed) Return to work 08/26/15 - colchicine 0.6 MG tablet; One twice a day for gout flare - up to 5 days  Dispense: 30 tablet; Refill: 0  2. Elevated PSA, less than 10 ng/ml Now on cipro for 30 days and will follow up with Urology   Halina Maidens, MD [facility]  08/23/2015

## 2015-09-12 ENCOUNTER — Other Ambulatory Visit: Payer: Self-pay | Admitting: Internal Medicine

## 2015-10-16 ENCOUNTER — Other Ambulatory Visit: Payer: 59

## 2015-10-16 DIAGNOSIS — R3129 Other microscopic hematuria: Secondary | ICD-10-CM

## 2015-10-17 LAB — PSA: PROSTATE SPECIFIC AG, SERUM: 6.5 ng/mL — AB (ref 0.0–4.0)

## 2015-10-18 NOTE — ED Provider Notes (Signed)
CSN: [id number]     Arrival date & time 08/21/15  [id number] History   First MD Initiated Contact with Patient 08/21/15 1112     Chief Complaint  Patient presents with  . Toe Pain   (Consider location/radiation/quality/duration/timing/severity/associated sxs/prior Treatment) HPI Comments: 40 yo male with a h/o gout presents with a c/o left big toe pain since yesterday. Denies any fevers, chills, trauma/injury, skin lesions and states symptoms are similar to prior gout attacks.   Patient is a 40 y.o. male presenting with toe pain. The history is provided by the patient.  Toe Pain This is a new problem. The current episode started yesterday. The problem occurs constantly. He has tried nothing for the symptoms.    Past Medical History  Diagnosis Date  . Essential (primary) hypertension 12/04/2014  . Controlled type 2 diabetes mellitus without complication, without long-term current use of insulin ([place]) 07/16/2015  . Benign essential hematuria 12/04/2014    microscopic hematuria evaluation with CT and cysto - treated for 2 months with bactrim DS; no further f/u planned unless hematuria recurs.    Past Surgical History  Procedure Laterality Date  . Partial colectomy  2005   Family History  Problem Relation Age of Onset  . Diabetes Mother   . Heart disease Mother   . Hypertension Mother   . Diabetes Maternal Grandmother   . Diabetes Maternal Grandfather   . Prostate cancer Maternal Uncle    Social History  Substance Use Topics  . Smoking status: Never Smoker   . Smokeless tobacco: None  . Alcohol Use: No    Review of Systems  Allergies  Percocet  Home Medications   Prior to Admission medications   Medication Sig Start Date End Date Taking? Authorizing Provider  amLODipine (NORVASC) 10 MG tablet TAKE ONE TABLET BY MOUTH ONCE DAILY 08/06/15   Glean Hess, MD  atorvastatin (LIPITOR) 10 MG tablet TAKE ONE TABLET BY MOUTH ONCE DAILY AT BEDTIME 09/12/15   Glean Hess, MD    ciprofloxacin (CIPRO) 250 MG tablet Take 1 tablet (250 mg total) by mouth 2 (two) times daily. 08/21/15   Nickie Retort, MD  colchicine 0.6 MG tablet One twice a day for gout flare - up to 5 days 08/23/15   Glean Hess, MD  hydrochlorothiazide (HYDRODIURIL) 25 MG tablet TAKE TWO TABLETS BY MOUTH ONCE DAILY 08/06/15   Glean Hess, MD  HYDROcodone-acetaminophen (NORCO/VICODIN) 5-325 MG tablet 1-2 tabs po q 8 hours prn pain 08/21/15   Norval Gable, MD  irbesartan (AVAPRO) 300 MG tablet TAKE ONE TABLET BY MOUTH ONCE DAILY 08/06/15   Glean Hess, MD  metFORMIN (GLUCOPHAGE) 500 MG tablet TAKE ONE TABLET BY MOUTH ONCE DAILY 04/28/15   Glean Hess, MD   Meds Ordered and Administered this Visit  Medications - No data to display  BP 145/93 mmHg  Pulse 100  Temp(Src) 99.1 F (37.3 C) (Tympanic)  Resp 17  Ht 6' (1.829 m)  Wt 409 lb (185.521 kg)  BMI 55.46 kg/m2  SpO2 100% No data found.   Physical Exam  Constitutional: He appears well-developed and well-nourished. No distress.  Musculoskeletal:       Left foot: There is decreased range of motion, tenderness, bony tenderness and swelling. There is no crepitus, no deformity and no laceration.       Feet:  Mild edema, erythema and tenderness to palpation over left 1st MTP joint; no skin lesions  Skin: He is not diaphoretic.  Nursing note and vitals reviewed.   ED Course  Procedures (including critical care time)  Labs Review Labs Reviewed - No data to display  Imaging Review No results found.   Visual Acuity Review  Right Eye Distance:   Left Eye Distance:   Bilateral Distance:    Right Eye Near:   Left Eye Near:    Bilateral Near:         MDM   1. Acute idiopathic gout involving toe of left foot    Discharge Medication List as of 08/21/2015 11:41 AM    START taking these medications   Details  HYDROcodone-acetaminophen (NORCO/VICODIN) 5-325 MG tablet 1-2 tabs po q 8 hours prn pain, Print     colchicine 0.6 MG tablet Take 2 tablets po once today; may repeat in 1 hour (max of 1.8mg  in 24 hours); then 1 tab po qd, Normal       1. Labs/x-ray results and diagnosis reviewed with patient 2. rx as per orders above; reviewed possible side effects, interactions, risks and benefits  3. Recommend supportive treatment with increased fluids, otc tylenol prn 4. Follow-up prn if symptoms worsen or don't improve    Norval Gable, MD 10/18/15 [id number]

## 2015-10-23 ENCOUNTER — Ambulatory Visit: Payer: 59

## 2015-10-24 ENCOUNTER — Encounter: Payer: Self-pay | Admitting: Urology

## 2015-10-24 ENCOUNTER — Ambulatory Visit (INDEPENDENT_AMBULATORY_CARE_PROVIDER_SITE_OTHER): Payer: 59 | Admitting: Urology

## 2015-10-24 VITALS — BP 154/107 | HR 90 | Ht 72.0 in | Wt >= 6400 oz

## 2015-10-24 DIAGNOSIS — Z87442 Personal history of urinary calculi: Secondary | ICD-10-CM | POA: Diagnosis not present

## 2015-10-24 DIAGNOSIS — R972 Elevated prostate specific antigen [PSA]: Secondary | ICD-10-CM

## 2015-10-24 DIAGNOSIS — R3129 Other microscopic hematuria: Secondary | ICD-10-CM | POA: Diagnosis not present

## 2015-10-24 LAB — URINALYSIS, COMPLETE
Bilirubin, UA: NEGATIVE
GLUCOSE, UA: NEGATIVE
KETONES UA: NEGATIVE
Leukocytes, UA: NEGATIVE
NITRITE UA: NEGATIVE
UUROB: 0.2 mg/dL (ref 0.2–1.0)
pH, UA: 5 (ref 5.0–7.5)

## 2015-10-24 LAB — MICROSCOPIC EXAMINATION
BACTERIA UA: NONE SEEN
RBC, UA: >30 /hpf — AB (ref 0–?)

## 2015-10-24 NOTE — Progress Notes (Signed)
10/24/2015 9:46 AM   Benjamin Stewart 10-Nov-1975 [id number]  Referring provider: Glean Hess, MD [address] [place], [place] [address]  Chief Complaint  Patient presents with  . Elevated PSA    82months    HPI: The patient is a 40 year old gentleman presents for evaluation for an elevated PSA.   1. Elevated PSA His PSA one month ago was 7.6. Repeat PSA was 7.0 two weeks ago. He denies any urinary symptoms at this time. He has had no recent urinary tract infections. He was given a week of antibiotics in between his PSA drawings. He denies dysuria, fever, chills, or incomplete emptying. The patient returns today after he placed on a month of antibiotics however his PSA is still elevated at 6.5. His prostate was difficult to palpate at his last exam due to body habitus.  2. Microscopic Hematuria The patient has a history of microscopic hematuria. He saw urologist in 2013 and had a cystoscopy and CT at that time which was negative.  3. History of nephrolithiasis The patient passed a kidney stone in 2001. This was his only stone. He is having no symptoms of a kidney stone at this time.   PMH: Past Medical History  Diagnosis Date  . Essential (primary) hypertension 12/04/2014  . Controlled type 2 diabetes mellitus without complication, without long-term current use of insulin ([place]) 07/16/2015  . Benign essential hematuria 12/04/2014    microscopic hematuria evaluation with CT and cysto - treated for 2 months with bactrim DS; no further f/u planned unless hematuria recurs.     Surgical History: Past Surgical History  Procedure Laterality Date  . Partial colectomy  2005    Home Medications:    Medication List       This list is accurate as of: 10/24/15  9:46 AM.  Always use your most recent med list.               amLODipine 10 MG tablet  Commonly known as:  NORVASC  TAKE ONE TABLET BY MOUTH ONCE DAILY     atorvastatin 10 MG tablet  Commonly known as:   LIPITOR  TAKE ONE TABLET BY MOUTH ONCE DAILY AT BEDTIME     colchicine 0.6 MG tablet  One twice a day for gout flare - up to 5 days     hydrochlorothiazide 25 MG tablet  Commonly known as:  HYDRODIURIL  TAKE TWO TABLETS BY MOUTH ONCE DAILY     irbesartan 300 MG tablet  Commonly known as:  AVAPRO  TAKE ONE TABLET BY MOUTH ONCE DAILY     metFORMIN 500 MG tablet  Commonly known as:  GLUCOPHAGE  TAKE ONE TABLET BY MOUTH ONCE DAILY        Allergies:  Allergies  Allergen Reactions  . Percocet [Oxycodone-Acetaminophen] Nausea And Vomiting    Family History: Family History  Problem Relation Age of Onset  . Diabetes Mother   . Heart disease Mother   . Hypertension Mother   . Diabetes Maternal Grandmother   . Diabetes Maternal Grandfather   . Prostate cancer Maternal Uncle     Social History:  reports that he has never smoked. He does not have any smokeless tobacco history on file. He reports that he does not drink alcohol or use illicit drugs.  ROS: UROLOGY Frequent Urination?: No Hard to postpone urination?: No Burning/pain with urination?: No Get up at night to urinate?: No Leakage of urine?: No Urine stream starts and stops?: No  Trouble starting stream?: No Do you have to strain to urinate?: No Blood in urine?: No Urinary tract infection?: No Sexually transmitted disease?: No Injury to kidneys or bladder?: No Painful intercourse?: No Weak stream?: No Erection problems?: No Penile pain?: No  Gastrointestinal Nausea?: No Vomiting?: No Indigestion/heartburn?: No Diarrhea?: No Constipation?: No  Constitutional Fever: No Night sweats?: No Weight loss?: No Fatigue?: No  Skin Skin rash/lesions?: No Itching?: No  Eyes Blurred vision?: No Double vision?: No  Ears/Nose/Throat Sore throat?: No Sinus problems?: No  Hematologic/Lymphatic Swollen glands?: No Easy bruising?: No  Cardiovascular Leg swelling?: No Chest pain?:  No  Respiratory Cough?: No Shortness of breath?: No  Endocrine Excessive thirst?: No  Musculoskeletal Back pain?: No Joint pain?: No  Neurological Headaches?: No Dizziness?: No  Psychologic Depression?: No Anxiety?: No  Physical Exam: BP 154/107 mmHg  Pulse 90  Ht 6' (1.829 m)  Wt 415 lb (188.243 kg)  BMI 56.27 kg/m2  Constitutional:  Alert and oriented, No acute distress. HEENT: [place] AT, moist mucus membranes.  Trachea midline, no masses. Cardiovascular: No clubbing, cyanosis, or edema. Respiratory: Normal respiratory effort, no increased work of breathing. GI: Abdomen is soft, nontender, nondistended, no abdominal masses GU: No CVA tenderness.  Skin: No rashes, bruises or suspicious lesions. Lymph: No cervical or inguinal adenopathy. Neurologic: Grossly intact, no focal deficits, moving all 4 extremities. Psychiatric: Normal mood and affect.  Laboratory Data: Lab Results  Component Value Date   WBC 13.1* 07/16/2015   HGB 14.6 05/14/2015   HCT 40.9 07/16/2015   MCV 87 07/16/2015   PLT 373 07/16/2015    Lab Results  Component Value Date   CREATININE 1.89* 07/16/2015    No results found for: PSA  No results found for: TESTOSTERONE  Lab Results  Component Value Date   HGBA1C 7.5* 07/16/2015    Urinalysis    Component Value Date/Time   COLORURINE PINK 11/23/2012 0823   APPEARANCEUR Cloudy* 08/21/2015 0913   APPEARANCEUR CLOUDY 11/23/2012 0823   LABSPEC 1.010 11/23/2012 0823   PHURINE 7.5 11/23/2012 0823   GLUCOSEU Negative 08/21/2015 0913   GLUCOSEU NEGATIVE 11/23/2012 0823   HGBUR 3+ 11/23/2012 0823   BILIRUBINUR Negative 08/21/2015 0913   BILIRUBINUR neg 07/16/2015 0846   BILIRUBINUR NEGATIVE 11/23/2012 0823   KETONESUR NEGATIVE 11/23/2012 0823   PROTEINUR 3+* 08/21/2015 0913   PROTEINUR moderate 07/16/2015 0846   PROTEINUR 100 mg/dL 11/23/2012 0823   UROBILINOGEN 0.2 07/16/2015 0846   NITRITE Negative 08/21/2015 0913   NITRITE neg  07/16/2015 0846   NITRITE NEGATIVE 11/23/2012 0823   LEUKOCYTESUR Negative 08/21/2015 0913   LEUKOCYTESUR Negative 07/16/2015 0846   LEUKOCYTESUR 1+ 11/23/2012 0823    Assessment & Plan:   The patient continues to have a persistently elevated PSA of 6.5 despite a long course of antibiotics. He is younger than that he will patient with prostate cancer, however I cannot explain why his PSA still remains elevated. At this point, I think it would be to his benefit to undergo a prostate biopsy to investigate this further. He understands the risks, benefits, and indications of this procedure. He understands the risks include but are not limited to bleeding and infection. He knows that there will be blood in his stool, semen, and urine. He also understands there is approximately 1% chance of sepsis which required IV antibiotics after the procedure. All questions were answered and the patient has elected to proceed.  1. Elevated PSA -prostate biopsy  2. Hematuria -Work up negative  a few years ago. He does have hematuria on today's urinalysis. Will continue to monitor.  3. History of nephrolithiasis -No evidence of disease.  Return for prostate biopsy.  Nickie Retort, MD  [facility] [address], [place], [place] [address] [phone]

## 2015-11-08 ENCOUNTER — Encounter: Payer: Self-pay | Admitting: *Deleted

## 2015-11-08 ENCOUNTER — Ambulatory Visit
Admission: EM | Admit: 2015-11-08 | Discharge: 2015-11-08 | Disposition: A | Discharge status: Home / Self Care | Payer: 59 | Attending: Family Medicine | Admitting: Family Medicine

## 2015-11-08 ENCOUNTER — Ambulatory Visit (INDEPENDENT_AMBULATORY_CARE_PROVIDER_SITE_OTHER): Payer: 59

## 2015-11-08 DIAGNOSIS — M10072 Idiopathic gout, left ankle and foot: Secondary | ICD-10-CM

## 2015-11-08 DIAGNOSIS — M109 Gout, unspecified: Secondary | ICD-10-CM

## 2015-11-08 DIAGNOSIS — M25572 Pain in left ankle and joints of left foot: Secondary | ICD-10-CM

## 2015-11-08 NOTE — ED Notes (Signed)
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 Pain and mild edema to left lateral malleolus region.

## 2015-11-08 NOTE — ED Provider Notes (Signed)
[facility]  ____________________________________________  Time seen: Approximately 7:59 PM  I have reviewed the triage vital signs and the nursing notes.   HISTORY  Chief Complaint Ankle Pain   HPI Benjamin Stewart is a 40 y.o. male presents with a complaint of left ankle pain since early this morning. Patient states that he works night shift and he was to get off work at 17, but reports pain started around 5 this morning.States pain currently 6/10 throbbing and tender to touch. Worse with palpation. Patient denies any fall or injury. 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 Patient states that he is standing stationary while at work with arm movements. Denies lifting, pivoting or other movements at work. Denies any fall, injury, rolling ankle or known trigger for pain. Patient reports that he does have a history of gout which feels very similar. Patient states that his gout has never been in his ankle before but has always been in his great toe. Patient reports that he has had gout in both great toes. Patient states that this feels similar to his gout previously as it is tender to light touch as well. Patient denies triggers for his gout. Denies increase of purines, red meat or shellfish.  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 Patient denies any break in skin, redness, drainage or infection. Denies recent sickness. Denies pain radiation. Denies any numbness or tingling sensation. Patient states pain is only to left lateral ankle. Denies calf tenderness, extremity swelling, injury. Denies any fevers.   Denies chest pain, shortness of breath, dizziness, weakness, numbness, tingling sensation, redness, drainage, pain radiation or other complaints.   PCP: Dr. Army Melia   Past Medical History  Diagnosis Date  . Essential (primary) hypertension 12/04/2014  . Controlled type 2 diabetes  mellitus without complication, without long-term current use of insulin ([place]) 07/16/2015  . Benign essential hematuria 12/04/2014    microscopic hematuria evaluation with CT and cysto - treated for 2 months with bactrim DS; no further f/u planned unless hematuria recurs.     Patient Active Problem List   Diagnosis Date Noted  . Elevated PSA, less than 10 ng/ml 08/23/2015  . Controlled type 2 diabetes mellitus without complication, without long-term current use of insulin ([place]) 07/16/2015  . Hyperlipidemia associated with type 2 diabetes mellitus ([place]) 07/16/2015  . Acute gouty arthropathy 12/04/2014  . Benign essential hematuria 12/04/2014  . Essential (primary) hypertension 12/04/2014  . Extreme obesity ([place]) 12/04/2014  . Impaired renal function 12/04/2014    Past Surgical History  Procedure Laterality Date  . Partial colectomy  2005    Current Outpatient Rx  Name  Route  Sig  Dispense  Refill  . amLODipine (NORVASC) 10 MG tablet      TAKE ONE TABLET BY MOUTH ONCE DAILY   30 tablet   5   . atorvastatin (LIPITOR) 10 MG tablet      TAKE ONE TABLET BY MOUTH ONCE DAILY AT BEDTIME   30 tablet   5   . hydrochlorothiazide (HYDRODIURIL) 25 MG tablet      TAKE TWO TABLETS BY MOUTH ONCE DAILY   60 tablet   5   . irbesartan (AVAPRO) 300 MG tablet      TAKE ONE TABLET BY MOUTH ONCE DAILY   30 tablet   5   . metFORMIN (GLUCOPHAGE) 500 MG tablet  TAKE ONE TABLET BY MOUTH ONCE DAILY   30 tablet   5   . colchicine 0.6 MG tablet      One twice a day for gout flare - up to 5 days   30 tablet   0     Allergies Percocet  Family History  Problem Relation Age of Onset  . Diabetes Mother   . Heart disease Mother   . Hypertension Mother   . Diabetes Maternal Grandmother   . Diabetes Maternal Grandfather   . Prostate cancer Maternal Uncle     Social History Social History  Substance Use Topics  . Smoking status: Never Smoker   . Smokeless tobacco: None  .  Alcohol Use: No    Review of Systems Constitutional: No fever/chills Eyes: No visual changes. ENT: No sore throat. Cardiovascular: Denies chest pain. Respiratory: Denies shortness of breath. Gastrointestinal: No abdominal pain.  No nausea, no vomiting.  No diarrhea.  No constipation. Genitourinary: Negative for dysuria. Musculoskeletal: Negative for back pain. Positive left ankle pain.  Skin: Negative for rash. Neurological: Negative for headaches, focal weakness or numbness.  10-point ROS otherwise negative.  ____________________________________________   PHYSICAL EXAM:  VITAL SIGNS: ED Triage Vitals  Enc Vitals Group     BP 11/08/15 1816 160/81 mmHg     Pulse Rate 11/08/15 1816 110 Recheck 99     Resp 11/08/15 1816 16     Temp 11/08/15 1816 99.6 F (37.6 C)     Temp Source 11/08/15 1816 Oral     SpO2 11/08/15 1816 99 %     Weight 11/08/15 1816 405 lb (183.707 kg)     Height 11/08/15 1816 6' (1.829 m)     Head Cir --      Peak Flow --      Pain Score 11/08/15 1820 9     Pain Loc --      Pain Edu? --      Excl. in [place]? --    Today's Vitals   11/08/15 1816 11/08/15 1820 11/08/15 2002 11/08/15 2005  BP: 160/81  135/77   Pulse: 110  107 100  Temp: 99.6 F (37.6 C)   99.3 F (37.4 C)  TempSrc: Oral   Oral  Resp: 16  16   Height: 6' (1.829 m)     Weight: 405 lb (183.707 kg)     SpO2: 99%  99%   PainSc:  9        Constitutional: Alert and oriented. Well appearing and in no acute distress. Eyes: Conjunctivae are normal. PERRL. EOMI. Head: Atraumatic.  Nose: No congestion/rhinnorhea.  Mouth/Throat: Mucous membranes are moist.   Neck: No stridor.  No cervical spine tenderness to palpation. Cardiovascular: Normal rate, regular rhythm. Grossly normal heart sounds.  Good peripheral circulation. Respiratory: Normal respiratory effort.  No retractions. Lungs CTAB. Gastrointestinal: Soft and nontender.  Musculoskeletal: No lower or upper extremity tenderness nor  edema.   Bilateral pedal pulses equal and easily palpated.   Except: left lateral ankle mild swelling, no erythema, no induration or fluctuance, skin intact, full ROM, mild to moderate tenderness to light touch and direct palpation to lateral ankle, skin intact, normal temperature to palpation, mildly antalgic gait; No calf tenderness to palpation; Left lower extremity otherwise nontender;Left foot nontender; No fluctuance or induration; capillary refill <2 seconds to all left foot distal toes; No signs of acute bacterial infection to left lower extremity; Pain noncirumferential to left ankle; No medial left ankle pain.  Neurologic:  Normal speech and language. No gross focal neurologic deficits are appreciated.  Skin:  Skin is warm, dry and intact. No rash noted. Psychiatric: Mood and affect are normal. Speech and behavior are normal. ____________________________________________   LABS (all labs ordered are listed, but only abnormal results are displayed)  Labs Reviewed - No data to display  RADIOLOGY EXAM: LEFT ANKLE COMPLETE - 3+ VIEW  COMPARISON: None  FINDINGS: Soft tissue swelling LEFT ankle and lower leg.  Osseous mineralization normal.  Joint spaces preserved.  No acute fracture, dislocation, or bone destruction.  Small Achilles insertion calcaneal spur.  IMPRESSION: No acute osseous abnormalities.   Electronically Signed By: Lavonia Dana M.D. On: 11/08/2015 18:51          DG Foot Complete Left (Final result) Result time: 11/08/15 18:52:53   Final result by Rad Results In Interface (11/08/15 18:52:53)   Narrative:   CLINICAL DATA: Pt woke up this morning with lateral left foot and ankle pain with no known trauma or injury. Pt states no hx of trauma or injury.  EXAM: LEFT FOOT - COMPLETE 3+ VIEW  COMPARISON: Ankle films, dictated separately.  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 Small Achilles spur.  Diffuse mild soft tissue swelling. Tibiotalar mild osteoarthritis. No acute fracture or dislocation.  IMPRESSION: Mild nonspecific soft tissue swelling. No acute findings.   Electronically Signed By: Abigail Miyamoto M.D. On: 11/08/2015 18:52   I, Marylene Land, personally viewed and evaluated these images (plain radiographs) as part of my medical decision making, as well as reviewing the written report by the radiologist.  [place] / [place] / ED COURSE  Pertinent labs & imaging results that were available during my care of the patient were reviewed by me and considered in my medical decision making (see chart for details).  Very well appearing. No acute distress. Morbidly obese patient. Ambulatory in room with mildly antalgic gait. Presenting for left lateral ankle pain. Denies fall or injury. Gradual onset of pain this morning after work. Denies trauma. History of gout in both feet in past and states does feel similar, but in ankle instead of great toe. Pain present at rest and with ambulation. Pain to light touch. Denies pain radiation. Denies other pain or discomfort. Left ankle and left foot xrays no acute osseous abnormalities, mild nonspecific soft tissue swelling on left foot xray per radiologist. Last gout flare up approximately 3 months ago. Do not see indication for labs at this time.   Suspect gouty left ankle pain. Patient states he has not taken anything for the ankle. Patient states he has colchicine at home and will take that. Denies need for additional medications. Denies need for crutches or walker. States has walking boot that he will use as needed. Requests work note for today and tomorrow as stands on feet all day at work, work note given. Encourage rest, ice, elevation. Encourage close monitoring and close PCP follow up.   Discussed follow up with Primary care physician this week. Discussed follow up and return parameters including no resolution or  any worsening concerns. Patient verbalized understanding and agreed to plan.   ____________________________________________   FINAL CLINICAL IMPRESSION(S) / ED DIAGNOSES  Final diagnoses:  Left ankle pain  Acute gout of left ankle, unspecified cause      Note: This dictation was prepared with Dragon dictation along with smaller phrase technology. Any transcriptional errors that result from this process are unintentional.    Marylene Land, NP 11/08/15 2057  Marylene Land, NP 11/08/15 2059

## 2015-11-08 NOTE — Discharge Instructions (Signed)
Take medication as prescribed by your doctor. Rest. Elevate ankle.  Follow up with your primary care physician this week as needed. Return to Urgent care for new or worsening concerns.   Ankle Pain Ankle pain is a common symptom. The bones, cartilage, tendons, and muscles of the ankle joint perform a lot of work each day. The ankle joint holds your body weight and allows you to move around. Ankle pain can occur on either side or back of 1 or both ankles. Ankle pain may be sharp and burning or dull and aching. There may be tenderness, stiffness, redness, or warmth around the ankle. The pain occurs more often when a person walks or puts pressure on the ankle. CAUSES  There are many reasons ankle pain can develop. It is important to work with your caregiver to identify the cause since many conditions can impact the bones, cartilage, muscles, and tendons. Causes for ankle pain include:  Injury, including a break (fracture), sprain, or strain often due to a fall, sports, or a high-impact activity.  Swelling (inflammation) of a tendon (tendonitis).  Achilles tendon rupture.  Ankle instability after repeated sprains and strains.  Poor foot alignment.  Pressure on a nerve (tarsal tunnel syndrome).  Arthritis in the ankle or the lining of the ankle.  Crystal formation in the ankle (gout or pseudogout). DIAGNOSIS  A diagnosis is based on your medical history, your symptoms, results of your physical exam, and results of diagnostic tests. Diagnostic tests may include X-ray exams or a computerized magnetic scan (magnetic resonance imaging, MRI). TREATMENT  Treatment will depend on the cause of your ankle pain and may include:  Keeping pressure off the ankle and limiting activities.  Using crutches or other walking support (a cane or brace).  Using rest, ice, compression, and elevation.  Participating in physical therapy or home exercises.  Wearing shoe inserts or special shoes.  Losing  weight.  Taking medications to reduce pain or swelling or receiving an injection.  Undergoing surgery. HOME CARE INSTRUCTIONS   Only take over-the-counter or prescription medicines for pain, discomfort, or fever as directed by your caregiver.  Put ice on the injured area.  Put ice in a plastic bag.  Place a towel between your skin and the bag.  Leave the ice on for 15-20 minutes at a time, 03-04 times a day.  Keep your leg raised (elevated) when possible to lessen swelling.  Avoid activities that cause ankle pain.  Follow specific exercises as directed by your caregiver.  Record how often you have ankle pain, the location of the pain, and what it feels like. This information may be helpful to you and your caregiver.  Ask your caregiver about returning to work or sports and whether you should drive.  Follow up with your caregiver for further examination, therapy, or testing as directed. SEEK MEDICAL CARE IF:   Pain or swelling continues or worsens beyond 1 week.  You have an oral temperature above 102 F (38.9 C).  You are feeling unwell or have chills.  You are having an increasingly difficult time with walking.  You have loss of sensation or other new symptoms.  You have questions or concerns. MAKE SURE YOU:   Understand these instructions.  Will watch your condition.  Will get help right away if you are not doing well or get worse.   This information is not intended to replace advice given to you by your health care provider. Make sure you discuss any questions you  have with your health care provider.   Document Released: 01/14/2010 Document Revised: 10/19/2011 Document Reviewed: 02/26/2015 Elsevier Interactive Patient Education 2016 [place].  Gout Gout is when your joints become red, sore, and swell (inflamed). This is caused by the buildup of uric acid crystals in the joints. Uric acid is a chemical that is normally in the blood. If the level of uric  acid gets too high in the blood, these crystals form in your joints and tissues. Over time, these crystals can form into masses near the joints and tissues. These masses can destroy bone and cause the bone to look misshapen (deformed). HOME CARE   Do not take aspirin for pain.  Only take medicine as told by your doctor.  Rest the joint as much as you can. When in bed, keep sheets and blankets off painful areas.  Keep the sore joints raised (elevated).  Put warm or cold packs on painful joints. Use of warm or cold packs depends on which works best for you.  Use crutches if the painful joint is in your leg.  Drink enough fluids to keep your pee (urine) clear or pale yellow. Limit alcohol, sugary drinks, and drinks with fructose in them.  Follow your diet instructions. Pay careful attention to how much protein you eat. Include fruits, vegetables, whole grains, and fat-free or low-fat milk products in your daily diet. Talk to your doctor or dietitian about the use of coffee, vitamin C, and cherries. These may help lower uric acid levels.  Keep a healthy body weight. GET HELP RIGHT AWAY IF:   You have watery poop (diarrhea), throw up (vomit), or have any side effects from medicines.  You do not feel better in 24 hours, or you are getting worse.  Your joint becomes suddenly more tender, and you have chills or a fever. MAKE SURE YOU:   Understand these instructions.  Will watch your condition.  Will get help right away if you are not doing well or get worse.   This information is not intended to replace advice given to you by your health care provider. Make sure you discuss any questions you have with your health care provider.   Document Released: 05/05/2008 Document Revised: 08/17/2014 Document Reviewed: 03/09/2012 Elsevier Interactive Patient Education [employer].

## 2015-11-11 ENCOUNTER — Ambulatory Visit (INDEPENDENT_AMBULATORY_CARE_PROVIDER_SITE_OTHER): Payer: 59 | Admitting: Internal Medicine

## 2015-11-11 ENCOUNTER — Encounter: Payer: Self-pay | Admitting: Internal Medicine

## 2015-11-11 VITALS — BP 110/66 | HR 88 | Ht 72.0 in | Wt >= 6400 oz

## 2015-11-11 DIAGNOSIS — E119 Type 2 diabetes mellitus without complications: Secondary | ICD-10-CM | POA: Diagnosis not present

## 2015-11-11 DIAGNOSIS — M109 Gout, unspecified: Secondary | ICD-10-CM | POA: Diagnosis not present

## 2015-11-11 DIAGNOSIS — I10 Essential (primary) hypertension: Secondary | ICD-10-CM | POA: Diagnosis not present

## 2015-11-11 MED ORDER — COLCHICINE 0.6 MG PO TABS: ORAL_TABLET | ORAL | Status: DC

## 2015-11-11 NOTE — Progress Notes (Signed)
Date:  11/11/2015   Name:  Benjamin Stewart   DOB:  Sep 02, 1975   MRN:  [id number]   Chief Complaint: Follow-up and Gout Diabetes He presents for his follow-up diabetic visit. He has type 2 diabetes mellitus. His disease course has been stable. Pertinent negatives for hypoglycemia include no dizziness or headaches. Pertinent negatives for diabetes include no chest pain and no fatigue. Current diabetic treatment includes oral agent (monotherapy). An ACE inhibitor/angiotensin II receptor blocker is being taken.  Hypertension This is a chronic problem. The current episode started more than 1 year ago. The problem is unchanged. The problem is controlled. Pertinent negatives include no chest pain, headaches, palpitations or shortness of breath.   Lab Results  Component Value Date   HGBA1C 7.5* 07/16/2015    Seen in UC three nights ago with ankle pain.  He had eaten at [employer] 2 days before.  His left ankle started to hurt but then just increased despite rest.  At [facility] x[facility] were normal.  It was diagnosed as gout.  He had 4 tablets colchicine which he took with good response.   Review of Systems  Constitutional: Negative for fever, chills and fatigue.  Respiratory: Negative for cough, chest tightness and shortness of breath.   Cardiovascular: Positive for leg swelling. Negative for chest pain and palpitations.  Musculoskeletal: Positive for arthralgias (left ankle).  Neurological: Negative for dizziness and headaches.    Patient Active Problem List   Diagnosis Date Noted  . Elevated PSA, less than 10 ng/ml 08/23/2015  . Controlled type 2 diabetes mellitus without complication, without long-term current use of insulin ([place]) 07/16/2015  . Hyperlipidemia associated with type 2 diabetes mellitus ([place]) 07/16/2015  . Acute gouty arthropathy 12/04/2014  . Benign essential hematuria 12/04/2014  . Essential (primary) hypertension 12/04/2014  . Extreme obesity ([place]) 12/04/2014  . Impaired renal  function 12/04/2014    Prior to Admission medications   Medication Sig Start Date End Date Taking? Authorizing Provider  amLODipine (NORVASC) 10 MG tablet TAKE ONE TABLET BY MOUTH ONCE DAILY 08/06/15  Yes Glean Hess, MD  atorvastatin (LIPITOR) 10 MG tablet TAKE ONE TABLET BY MOUTH ONCE DAILY AT BEDTIME 09/12/15  Yes Glean Hess, MD  colchicine 0.6 MG tablet One twice a day for gout flare - up to 5 days 08/23/15  Yes Glean Hess, MD  hydrochlorothiazide (HYDRODIURIL) 25 MG tablet TAKE TWO TABLETS BY MOUTH ONCE DAILY 08/06/15  Yes Glean Hess, MD  irbesartan (AVAPRO) 300 MG tablet TAKE ONE TABLET BY MOUTH ONCE DAILY 08/06/15  Yes Glean Hess, MD  metFORMIN (GLUCOPHAGE) 500 MG tablet TAKE ONE TABLET BY MOUTH ONCE DAILY 04/28/15  Yes Glean Hess, MD    Allergies  Allergen Reactions  . Percocet [Oxycodone-Acetaminophen] Nausea And Vomiting    Past Surgical History  Procedure Laterality Date  . Partial colectomy  2005    Social History  Substance Use Topics  . Smoking status: Never Smoker   . Smokeless tobacco: None  . Alcohol Use: No     Medication list has been reviewed and updated.   Physical Exam  Constitutional: He is oriented to person, place, and time. He appears well-developed and well-nourished. No distress.  HENT:  Head: Normocephalic and atraumatic.  Cardiovascular: Normal rate, regular rhythm and normal heart sounds.   Pulmonary/Chest: Effort normal and breath sounds normal. No respiratory distress.  Musculoskeletal: Normal range of motion.       Left ankle: He exhibits  swelling. Tenderness.  Neurological: He is alert and oriented to person, place, and time.  Skin: Skin is warm and dry. No rash noted.  Psychiatric: He has a normal mood and affect. His behavior is normal. Thought content normal.  Nursing note and vitals reviewed.   BP 110/66 mmHg  Pulse 88  Ht 6' (1.829 m)  Wt 411 lb 6.4 oz (186.61 kg)  BMI 55.78 kg/m2  Assessment  and Plan: 1. Acute gouty arthropathy Continue colchicine for 5 days - colchicine 0.6 MG tablet; One twice a day for gout flare - up to 5 days  Dispense: 30 tablet; Refill: 2 - Uric acid  2. Essential (primary) hypertension controlled  3. Controlled type 2 diabetes mellitus without complication, without long-term current use of insulin ([place]) Continue metformin Will advise if another medication is needed - Hemoglobin A1c   Halina Maidens, MD [facility]  11/11/2015

## 2015-11-12 LAB — HEMOGLOBIN A1C
Est. average glucose Bld gHb Est-mCnc: 160 mg/dL
HEMOGLOBIN A1C: 7.2 % — AB (ref 4.8–5.6)

## 2015-11-12 LAB — URIC ACID: Uric Acid: 9.7 mg/dL — ABNORMAL HIGH (ref 3.7–8.6)

## 2015-11-13 ENCOUNTER — Telehealth: Payer: Self-pay

## 2015-11-13 NOTE — Telephone Encounter (Signed)
-----   Message from Glean Hess, MD sent at 11/12/2015  8:31 AM EDT ----- Dm is slightly improved.  Continue same medication.  Uric acid level is very high - gout is likely to recur.  After this episode has resolved completely, I want you to start taking a daily preventative called allopurinol.  I will send it to the pharmacy after confirmation of this message.

## 2015-11-13 NOTE — Telephone Encounter (Signed)
Left message for patient to call back  

## 2015-11-14 ENCOUNTER — Encounter: Payer: Self-pay | Admitting: Urology

## 2015-11-14 ENCOUNTER — Ambulatory Visit (INDEPENDENT_AMBULATORY_CARE_PROVIDER_SITE_OTHER): Payer: 59 | Admitting: Urology

## 2015-11-14 ENCOUNTER — Other Ambulatory Visit: Payer: Self-pay | Admitting: Urology

## 2015-11-14 VITALS — BP 156/102 | HR 96 | Ht 72.0 in | Wt >= 6400 oz

## 2015-11-14 DIAGNOSIS — R972 Elevated prostate specific antigen [PSA]: Secondary | ICD-10-CM

## 2015-11-14 MED ORDER — LIDOCAINE HCL 2 % EX GEL
1.0000 "application " | Freq: Once | CUTANEOUS | Status: AC
  Administered 2015-11-14: 1 via URETHRAL

## 2015-11-14 MED ORDER — GENTAMICIN SULFATE 40 MG/ML IJ SOLN
80.0000 mg | Freq: Once | INTRAMUSCULAR | Status: AC
  Administered 2015-11-14: 80 mg via INTRAMUSCULAR

## 2015-11-14 MED ORDER — LEVOFLOXACIN 500 MG PO TABS
500.0000 mg | ORAL_TABLET | Freq: Once | ORAL | Status: AC
  Administered 2015-11-14: 500 mg via ORAL

## 2015-11-14 NOTE — Progress Notes (Signed)
Prostate Biopsy Procedure   Informed consent was obtained after discussing risks/benefits of the procedure.  A time out was performed to ensure correct patient identity.  Pre-Procedure: - Last PSA Level: 6.5 - Gentamicin IM given prophylactically - Levaquin 500 mg administered PO -Transrectal Ultrasound performed revealing a 22.63 gm prostate -No significant hypoechoic or median lobe noted  Procedure: - Prostate block performed using 10 cc 1% lidocaine and biopsies taken from sextant areas, a total of 12 under ultrasound guidance.  Post-Procedure: - Patient tolerated the procedure well - He was counseled to seek immediate medical attention if experiences any severe pain, significant bleeding, or fevers - Return in one week to discuss biopsy results

## 2015-11-21 ENCOUNTER — Ambulatory Visit: Payer: 59 | Admitting: Urology

## 2015-11-21 LAB — PATHOLOGY REPORT

## 2015-11-22 ENCOUNTER — Other Ambulatory Visit: Payer: Self-pay | Admitting: Internal Medicine

## 2015-11-26 ENCOUNTER — Other Ambulatory Visit: Payer: Self-pay | Admitting: Internal Medicine

## 2015-11-26 ENCOUNTER — Ambulatory Visit: Payer: 59

## 2015-11-26 DIAGNOSIS — M109 Gout, unspecified: Secondary | ICD-10-CM

## 2015-11-26 MED ORDER — ALLOPURINOL 100 MG PO TABS: 100.0000 mg | ORAL_TABLET | Freq: Every day | ORAL | Status: DC

## 2015-11-26 NOTE — Telephone Encounter (Signed)
Spoke with pt. Pt. Advised. Please send in Allopurinol to [facility].

## 2015-11-28 ENCOUNTER — Encounter: Payer: Self-pay | Admitting: Internal Medicine

## 2015-11-28 ENCOUNTER — Encounter: Payer: Self-pay | Admitting: Urology

## 2015-11-28 ENCOUNTER — Other Ambulatory Visit: Payer: Self-pay | Admitting: Urology

## 2015-11-28 ENCOUNTER — Ambulatory Visit (INDEPENDENT_AMBULATORY_CARE_PROVIDER_SITE_OTHER): Payer: 59 | Admitting: Urology

## 2015-11-28 VITALS — BP 150/92 | HR 92 | Ht 72.0 in | Wt >= 6400 oz

## 2015-11-28 DIAGNOSIS — R3129 Other microscopic hematuria: Secondary | ICD-10-CM

## 2015-11-28 DIAGNOSIS — N200 Calculus of kidney: Secondary | ICD-10-CM

## 2015-11-28 DIAGNOSIS — C61 Malignant neoplasm of prostate: Secondary | ICD-10-CM

## 2015-11-28 NOTE — Progress Notes (Signed)
11/28/2015 12:26 PM   Benjamin Stewart [birth year] [id number]  Referring provider: Glean Hess, MD [address] [place], [place] [address]  Chief Complaint  Patient presents with  . Follow-up    prostate biopsy    HPI:  The patient is a 40 year old gentleman who presents today to discuss his biopsy results.  1. Prostate cancer Unfortunately, his biopsy results came back with Gleason 3+3 = 6 prostate cancer. He has 712 cores positive. With 5 of the cores ranging from 1-9%. He had 2 cores of approximately 37%.  2. Microscopic Hematuria The patient has a history of microscopic hematuria. He saw urologist in 2013 and had a cystoscopy and CT at that time which was negative.  3. History of nephrolithiasis The patient passed a kidney stone in 2001. This was his only stone. He is having no symptoms of a kidney stone at this time.    PMH: Past Medical History  Diagnosis Date  . Essential (primary) hypertension 12/04/2014  . Controlled type 2 diabetes mellitus without complication, without long-term current use of insulin ([place]) 07/16/2015  . Benign essential hematuria 12/04/2014    microscopic hematuria evaluation with CT and cysto - treated for 2 months with bactrim DS; no further f/u planned unless hematuria recurs.     Surgical History: Past Surgical History  Procedure Laterality Date  . Partial colectomy  2005    Home Medications:    Medication List       This list is accurate as of: 11/28/15 12:26 PM.  Always use your most recent med list.               allopurinol 100 MG tablet  Commonly known as:  ZYLOPRIM  Take 1 tablet (100 mg total) by mouth daily.     amLODipine 10 MG tablet  Commonly known as:  NORVASC  TAKE ONE TABLET BY MOUTH ONCE DAILY     atorvastatin 10 MG tablet  Commonly known as:  LIPITOR  TAKE ONE TABLET BY MOUTH ONCE DAILY AT BEDTIME     colchicine 0.6 MG tablet  One twice a day for gout flare - up to 5 days     hydrochlorothiazide 25 MG tablet  Commonly known as:  HYDRODIURIL  TAKE TWO TABLETS BY MOUTH ONCE DAILY     irbesartan 300 MG tablet  Commonly known as:  AVAPRO  TAKE ONE TABLET BY MOUTH ONCE DAILY     metFORMIN 500 MG tablet  Commonly known as:  GLUCOPHAGE  TAKE ONE TABLET BY MOUTH ONCE DAILY        Allergies:  Allergies  Allergen Reactions  . Percocet [Oxycodone-Acetaminophen] Nausea And Vomiting    Family History: Family History  Problem Relation Age of Onset  . Diabetes Mother   . Heart disease Mother   . Hypertension Mother   . Diabetes Maternal Grandmother   . Diabetes Maternal Grandfather   . Prostate cancer Maternal Uncle     Social History:  reports that he has never smoked. He does not have any smokeless tobacco history on file. He reports that he does not drink alcohol or use illicit drugs.  ROS:                                        Physical Exam: BP 150/92 mmHg  Pulse 92  Ht 6' (1.829 m)  Wt 419 lb (190.057  kg)  BMI 56.81 kg/m2  Constitutional:  Alert and oriented, No acute distress. HEENT: [place] AT, moist mucus membranes.  Trachea midline, no masses. Cardiovascular: No clubbing, cyanosis, or edema. Respiratory: Normal respiratory effort, no increased work of breathing. GI: Abdomen is soft, nontender, nondistended, no abdominal masses GU: No CVA tenderness.  Skin: No rashes, bruises or suspicious lesions. Lymph: No cervical or inguinal adenopathy. Neurologic: Grossly intact, no focal deficits, moving all 4 extremities. Psychiatric: Normal mood and affect.  Laboratory Data: Lab Results  Component Value Date   WBC 13.1* 07/16/2015   HGB 14.6 05/14/2015   HCT 40.9 07/16/2015   MCV 87 07/16/2015   PLT 373 07/16/2015    Lab Results  Component Value Date   CREATININE 1.89* 07/16/2015    No results found for: PSA  No results found for: TESTOSTERONE  Lab Results  Component Value Date   HGBA1C 7.2* 11/11/2015     Urinalysis    Component Value Date/Time   COLORURINE PINK 11/23/2012 0823   APPEARANCEUR Hazy* 10/24/2015 0930   APPEARANCEUR CLOUDY 11/23/2012 0823   LABSPEC 1.010 11/23/2012 0823   PHURINE 7.5 11/23/2012 0823   GLUCOSEU Negative 10/24/2015 0930   GLUCOSEU NEGATIVE 11/23/2012 0823   HGBUR 3+ 11/23/2012 0823   BILIRUBINUR Negative 10/24/2015 0930   BILIRUBINUR neg 07/16/2015 0846   BILIRUBINUR NEGATIVE 11/23/2012 0823   KETONESUR NEGATIVE 11/23/2012 0823   PROTEINUR 3+* 10/24/2015 0930   PROTEINUR moderate 07/16/2015 0846   PROTEINUR 100 mg/dL 11/23/2012 0823   UROBILINOGEN 0.2 07/16/2015 0846   NITRITE Negative 10/24/2015 0930   NITRITE neg 07/16/2015 0846   NITRITE NEGATIVE 11/23/2012 0823   LEUKOCYTESUR Negative 10/24/2015 0930   LEUKOCYTESUR Negative 07/16/2015 0846   LEUKOCYTESUR 1+ 11/23/2012 0823     Assessment & Plan:    I had a very long discussion with the patient regarding his new diagnosis of low risk prostate cancer. We discussed treatment options include watchful waiting, active surveillance, robotic prostatectomy, radiation therapy, and cryotherapy. We discussed the role specifically in great detail of active surveillance. He understands the goal is not to deny treatment but to wait until his prostate cancer progresses to still treat the cancer a curative window. He understands the downsize of this to be multiple doctor's office visits as well as prostate biopsies. He also understands that theoretical risk of missing a curative window for his prostate cancer. We also discussed robotic prostatectomy. I do not think he was a good candidate due to his body habitus. I discussed this with the patient both brachytherapy and external beam irradiation. We briefly discussed cryotherapy as well. We also discussed that due to his relatively young age, that he is at fairly high risk of needing to perform treatment that is definitive at some point. After discussing the risks,  benefits, indications of each possible pathway, he is elected to pursue active surveillance. We will bring him back in 3 months for repeat biopsy to start him on this course.  1. Low risk prostate cancer -repeat prostate biopsy in 3 months  2. Hematuria -Work up negative a few years ago.   3. History of nephrolithiasis -No evidence of disease. Return in about 3 months (around 02/27/2016) for prostate biopsy.  Nickie Retort, MD  [facility] [address], [place], [place] [address] [phone]

## 2015-12-02 ENCOUNTER — Ambulatory Visit: Payer: 59

## 2016-01-14 ENCOUNTER — Encounter: Payer: Self-pay | Admitting: Internal Medicine

## 2016-01-14 ENCOUNTER — Ambulatory Visit: Payer: 59 | Admitting: Internal Medicine

## 2016-02-27 ENCOUNTER — Encounter: Payer: Self-pay | Admitting: Urology

## 2016-02-27 ENCOUNTER — Ambulatory Visit (INDEPENDENT_AMBULATORY_CARE_PROVIDER_SITE_OTHER): Payer: 59 | Admitting: Urology

## 2016-02-27 ENCOUNTER — Other Ambulatory Visit: Payer: Self-pay | Admitting: Urology

## 2016-02-27 VITALS — BP 145/92 | HR 101 | Ht 72.0 in | Wt >= 6400 oz

## 2016-02-27 DIAGNOSIS — C61 Malignant neoplasm of prostate: Secondary | ICD-10-CM

## 2016-02-27 NOTE — Progress Notes (Signed)
The patient is a 40 year old male presents for active surveillance repeat prostate biopsy for his low risk Gleason 6 prostate cancer.  Prostate Biopsy Procedure   Informed consent was obtained after discussing risks/benefits of the procedure.  A time out was performed to ensure correct patient identity.  Pre-Procedure: - Last PSA Level: 6.5 - Gentamicin given prophylactically - Levaquin 500 mg administered PO -Transrectal Ultrasound performed  -No significant hypoechoic or median lobe noted  Procedure: - Prostate block performed using 10 cc 1% lidocaine and biopsies taken from sextant areas, a total of 12 under ultrasound guidance.  Post-Procedure: - Patient tolerated the procedure well - He was counseled to seek immediate medical attention if experiences any severe pain, significant bleeding, or fevers - Return in one week to discuss biopsy results

## 2016-03-04 ENCOUNTER — Other Ambulatory Visit: Payer: Self-pay | Admitting: Urology

## 2016-03-04 LAB — PATHOLOGY REPORT

## 2016-03-06 ENCOUNTER — Ambulatory Visit (INDEPENDENT_AMBULATORY_CARE_PROVIDER_SITE_OTHER): Payer: 59 | Admitting: Urology

## 2016-03-06 DIAGNOSIS — C61 Malignant neoplasm of prostate: Secondary | ICD-10-CM

## 2016-03-06 NOTE — Progress Notes (Signed)
03/06/2016 11:42 AM   Benjamin Stewart Aug 05, 1976 [id number]  Referring provider: Glean Hess, MD [address] [place], [place] [address]  Chief Complaint  Patient presents with  . Follow-up    BX results    HPI: The patient is a 40 year old gentleman who presents today to discuss his repeat biopsy results.  1. Prostate cancer Biopsy #1: Gleason 3+3 = 6 prostate cancer.  7/12 cores positive. 5 of the cores ranging from 1-9%. He had 2 cores of approximately 37%.  Biopsy#2: Gleason 3+3 = 6 prostate cancer. 5/12 cores positive 1 core was approximately 33%. The remaining 4 ranged from 3-8%  10/12 biopsy regions with at least one positive specimen on one of the above biopsys (LMB and RLB only regions without positive core on both biopsies)   2. Microscopic Hematuria The patient has a history of microscopic hematuria. He saw urologist in 2013 and had a cystoscopy and CT at that time which was negative.  3. History of nephrolithiasis The patient passed a kidney stone in 2001. This was his only stone. He is having no symptoms of a kidney stone at this time.   PMH: Past Medical History:  Diagnosis Date  . Benign essential hematuria 12/04/2014   microscopic hematuria evaluation with CT and cysto - treated for 2 months with bactrim DS; no further f/u planned unless hematuria recurs.   . Controlled type 2 diabetes mellitus without complication, without long-term current use of insulin ([place]) 07/16/2015  . Essential (primary) hypertension 12/04/2014    Surgical History: Past Surgical History:  Procedure Laterality Date  . PARTIAL COLECTOMY  2005    Home Medications:    Medication List       Accurate as of 03/06/16 11:42 AM. Always use your most recent med list.          allopurinol 100 MG tablet Commonly known as:  ZYLOPRIM Take 1 tablet (100 mg total) by mouth daily.   amLODipine 10 MG tablet Commonly known as:  NORVASC TAKE ONE TABLET BY MOUTH  ONCE DAILY   atorvastatin 10 MG tablet Commonly known as:  LIPITOR TAKE ONE TABLET BY MOUTH ONCE DAILY AT BEDTIME   colchicine 0.6 MG tablet One twice a day for gout flare - up to 5 days   hydrochlorothiazide 25 MG tablet Commonly known as:  HYDRODIURIL TAKE TWO TABLETS BY MOUTH ONCE DAILY   irbesartan 300 MG tablet Commonly known as:  AVAPRO TAKE ONE TABLET BY MOUTH ONCE DAILY   metFORMIN 500 MG tablet Commonly known as:  GLUCOPHAGE TAKE ONE TABLET BY MOUTH ONCE DAILY       Allergies:  Allergies  Allergen Reactions  . Percocet [Oxycodone-Acetaminophen] Nausea And Vomiting    Family History: Family History  Problem Relation Age of Onset  . Diabetes Mother   . Heart disease Mother   . Hypertension Mother   . Diabetes Maternal Grandmother   . Diabetes Maternal Grandfather   . Prostate cancer Maternal Uncle     Social History:  reports that he has never smoked. He does not have any smokeless tobacco history on file. He reports that he does not drink alcohol or use drugs.  ROS:                                        Physical Exam: VSS Constitutional:  Alert and oriented, No acute distress. HEENT:  [place] AT, moist mucus membranes.  Trachea midline, no masses. Cardiovascular: No clubbing, cyanosis, or edema. Respiratory: Normal respiratory effort, no increased work of breathing. GI: Abdomen is soft, nontender, nondistended, no abdominal masses GU: No CVA tenderness.  Skin: No rashes, bruises or suspicious lesions. Lymph: No cervical or inguinal adenopathy. Neurologic: Grossly intact, no focal deficits, moving all 4 extremities. Psychiatric: Normal mood and affect.  Laboratory Data: Lab Results  Component Value Date   WBC 13.1 (H) 07/16/2015   HGB 14.6 05/14/2015   HCT 40.9 07/16/2015   MCV 87 07/16/2015   PLT 373 07/16/2015    Lab Results  Component Value Date   CREATININE 1.89 (H) 07/16/2015    No results found for: PSA  No  results found for: TESTOSTERONE  Lab Results  Component Value Date   HGBA1C 7.2 (H) 11/11/2015    Urinalysis    Component Value Date/Time   COLORURINE PINK 11/23/2012 0823   APPEARANCEUR Hazy (A) 10/24/2015 0930   LABSPEC 1.010 11/23/2012 0823   PHURINE 7.5 11/23/2012 0823   GLUCOSEU Negative 10/24/2015 0930   GLUCOSEU NEGATIVE 11/23/2012 0823   HGBUR 3+ 11/23/2012 0823   BILIRUBINUR Negative 10/24/2015 0930   BILIRUBINUR NEGATIVE 11/23/2012 0823   KETONESUR NEGATIVE 11/23/2012 0823   PROTEINUR 3+ (A) 10/24/2015 0930   PROTEINUR 100 mg/dL 11/23/2012 0823   UROBILINOGEN 0.2 07/16/2015 0846   NITRITE Negative 10/24/2015 0930   NITRITE NEGATIVE 11/23/2012 0823   LEUKOCYTESUR Negative 10/24/2015 0930   LEUKOCYTESUR 1+ 11/23/2012 0823      Assessment & Plan:    1. Prostate cancer I had a long discussion with the patient regarding his repeat prostate biopsy results. He has fairly diffuse Gleason 3+3 = 6 prostate cancer with local for volume of cancer. Begin discuss treatment methods including active surveillance, surgery, and radiation. Again I do not think he would be a good candidate for surgery given his body habitus. Given his age and relatively diffuse disease, he will more than likely need treatment at some point. We will continue to monitor his PSA at this time. If it begins to increase at any point, we will have the patient undergo an MRI of his prostate.  Return in about 3 months (around 06/06/2016) for with psa one week prior.  Nickie Retort, MD  [facility] [address] [place], [place] [address] [phone]

## 2016-03-08 ENCOUNTER — Other Ambulatory Visit: Payer: Self-pay | Admitting: Internal Medicine

## 2016-03-16 ENCOUNTER — Ambulatory Visit (INDEPENDENT_AMBULATORY_CARE_PROVIDER_SITE_OTHER): Payer: 59 | Admitting: Internal Medicine

## 2016-03-16 ENCOUNTER — Encounter: Payer: Self-pay | Admitting: Internal Medicine

## 2016-03-16 VITALS — BP 136/86 | HR 72 | Resp 16 | Ht 72.0 in | Wt >= 6400 oz

## 2016-03-16 DIAGNOSIS — N183 Chronic kidney disease, stage 3 unspecified: Secondary | ICD-10-CM

## 2016-03-16 DIAGNOSIS — C61 Malignant neoplasm of prostate: Secondary | ICD-10-CM | POA: Diagnosis not present

## 2016-03-16 DIAGNOSIS — I10 Essential (primary) hypertension: Secondary | ICD-10-CM | POA: Diagnosis not present

## 2016-03-16 DIAGNOSIS — M109 Gout, unspecified: Secondary | ICD-10-CM | POA: Diagnosis not present

## 2016-03-16 DIAGNOSIS — E1129 Type 2 diabetes mellitus with other diabetic kidney complication: Secondary | ICD-10-CM | POA: Diagnosis not present

## 2016-03-16 MED ORDER — ATORVASTATIN CALCIUM 10 MG PO TABS: 10.0000 mg | ORAL_TABLET | Freq: Every day | ORAL | 5 refills | Status: DC

## 2016-03-16 MED ORDER — IRBESARTAN 300 MG PO TABS: 300.0000 mg | ORAL_TABLET | Freq: Every day | ORAL | 5 refills | Status: DC

## 2016-03-16 MED ORDER — HYDROCHLOROTHIAZIDE 25 MG PO TABS: 50.0000 mg | ORAL_TABLET | Freq: Every day | ORAL | 5 refills | Status: DC

## 2016-03-16 MED ORDER — AMLODIPINE BESYLATE 10 MG PO TABS: 10.0000 mg | ORAL_TABLET | Freq: Every day | ORAL | 5 refills | Status: DC

## 2016-03-16 NOTE — Progress Notes (Signed)
Date:  03/16/2016   Name:  Benjamin Stewart   DOB:  1976/07/02   MRN:  [id number]   Chief Complaint: Hypertension Gout - flare in April.  Uric was elevated - allopurinol started.  No flare ups since then.  He has mild decrease in renal function not checked recently.   Hypertension  This is a chronic problem. The current episode started more than 1 year ago. The problem is unchanged. The problem is controlled. Pertinent negatives include no chest pain, headaches, palpitations or shortness of breath.  Diabetes  He presents for his follow-up diabetic visit. He has type 2 diabetes mellitus. His disease course has been stable. Pertinent negatives for hypoglycemia include no headaches or tremors. Pertinent negatives for diabetes include no chest pain, no fatigue, no polydipsia and no polyuria. He is compliant with treatment most of the time. His breakfast blood glucose is taken between 6-7 am. His breakfast blood glucose range is generally 90-110 mg/dl. His dinner blood glucose is taken between 6-7 pm. His dinner blood glucose range is generally 140-180 mg/dl.    Lab Results  Component Value Date   HGBA1C 7.2 (H) 11/11/2015   Lab Results  Component Value Date   LABURIC 9.7 (H) 11/11/2015   Lab Results  Component Value Date   CREATININE 1.89 (H) 07/16/2015     Review of Systems  Constitutional: Negative for appetite change, fatigue and unexpected weight change.  Eyes: Negative for visual disturbance.  Respiratory: Negative for cough, shortness of breath and wheezing.   Cardiovascular: Negative for chest pain, palpitations and leg swelling.  Gastrointestinal: Negative for abdominal pain and blood in stool.  Endocrine: Negative for polydipsia and polyuria.  Genitourinary: Negative for dysuria and hematuria.  Skin: Negative for color change and rash.  Neurological: Negative for tremors, numbness and headaches.  Psychiatric/Behavioral: Negative for dysphoric mood.    Patient Active  Problem List   Diagnosis Date Noted  . Prostate cancer ([place]) 08/23/2015  . DM (diabetes mellitus) type II controlled with renal manifestation ([place]) 07/16/2015  . Hyperlipidemia associated with type 2 diabetes mellitus ([place]) 07/16/2015  . Acute gouty arthropathy 12/04/2014  . Benign essential hematuria 12/04/2014  . Essential (primary) hypertension 12/04/2014  . Extreme obesity ([place]) 12/04/2014  . CKD (chronic kidney disease), stage III 12/04/2014    Prior to Admission medications   Medication Sig Start Date End Date Taking? Authorizing Provider  allopurinol (ZYLOPRIM) 100 MG tablet Take 1 tablet (100 mg total) by mouth daily. 11/26/15  Yes Glean Hess, MD  amLODipine (NORVASC) 10 MG tablet TAKE ONE TABLET BY MOUTH ONCE DAILY 03/09/16  Yes Glean Hess, MD  atorvastatin (LIPITOR) 10 MG tablet TAKE ONE TABLET BY MOUTH ONCE DAILY AT BEDTIME 09/12/15  Yes Glean Hess, MD  colchicine 0.6 MG tablet One twice a day for gout flare - up to 5 days 11/11/15  Yes Glean Hess, MD  hydrochlorothiazide (HYDRODIURIL) 25 MG tablet TAKE TWO TABLETS BY MOUTH ONCE DAILY 03/09/16  Yes Glean Hess, MD  irbesartan (AVAPRO) 300 MG tablet TAKE ONE TABLET BY MOUTH ONCE DAILY 03/09/16  Yes Glean Hess, MD  metFORMIN (GLUCOPHAGE) 500 MG tablet TAKE ONE TABLET BY MOUTH ONCE DAILY 11/22/15  Yes Glean Hess, MD    Allergies  Allergen Reactions  . Percocet [Oxycodone-Acetaminophen] Nausea And Vomiting    Past Surgical History:  Procedure Laterality Date  . PARTIAL COLECTOMY  2005    Social History  Substance Use Topics  .  Smoking status: Never Smoker  . Smokeless tobacco: Not on file  . Alcohol use No     Medication list has been reviewed and updated.   Physical Exam  Constitutional: He is oriented to person, place, and time. He appears well-developed. No distress.  HENT:  Head: Normocephalic and atraumatic.  Neck: Normal range of motion. Neck supple. No thyromegaly present.    Pulmonary/Chest: Effort normal. No respiratory distress.  Musculoskeletal: Normal range of motion. He exhibits no edema or tenderness.  Lymphadenopathy:    He has no cervical adenopathy.  Neurological: He is alert and oriented to person, place, and time.  Skin: Skin is warm and dry. No rash noted.  Psychiatric: He has a normal mood and affect. His behavior is normal. Thought content normal.    BP 136/86 (BP Location: Right Arm, Patient Position: Sitting, Cuff Size: Large)   Pulse 72   Resp 16   Ht 6' (1.829 m)   Wt (!) 416 lb (188.7 kg)   SpO2 95%   BMI 56.42 kg/m   Assessment and Plan: 1. Essential (primary) hypertension controlled - irbesartan (AVAPRO) 300 MG tablet; Take 1 tablet (300 mg total) by mouth daily.  Dispense: 30 tablet; Refill: 5 - hydrochlorothiazide (HYDRODIURIL) 25 MG tablet; Take 2 tablets (50 mg total) by mouth daily.  Dispense: 60 tablet; Refill: 5 - amLODipine (NORVASC) 10 MG tablet; Take 1 tablet (10 mg total) by mouth daily.  Dispense: 30 tablet; Refill: 5  2. 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 Acute gouty arthropathy Now on Allopurinol - goal UA < 6 - Uric acid  4. CKD (chronic kidney disease), stage III - Basic metabolic panel  5. Prostate cancer [facility]) Regular follow up with Urology - no treatment for now   Halina Maidens, MD [facility]  03/16/2016

## 2016-03-16 NOTE — Patient Instructions (Signed)
DASH Eating Plan  DASH stands for "Dietary Approaches to Stop Hypertension." The DASH eating plan is a healthy eating plan that has been shown to reduce high blood pressure (hypertension). Additional health benefits may include reducing the risk of type 2 diabetes mellitus, heart disease, and stroke. The DASH eating plan may also help with weight loss.  WHAT DO I NEED TO KNOW ABOUT THE DASH EATING PLAN?  For the DASH eating plan, you will follow these general guidelines:  · Choose foods with a percent daily value for sodium of less than 5% (as listed on the food label).  · Use salt-free seasonings or herbs instead of table salt or sea salt.  · Check with your health care provider or pharmacist before using salt substitutes.  · Eat lower-sodium products, often labeled as "lower sodium" or "no salt added."  · Eat fresh foods.  · Eat more vegetables, fruits, and low-fat dairy products.  · Choose whole grains. Look for the word "whole" as the first word in the ingredient list.  · Choose fish and skinless chicken or turkey more often than red meat. Limit fish, poultry, and meat to 6 oz (170 g) each day.  · Limit sweets, desserts, sugars, and sugary drinks.  · Choose heart-healthy fats.  · Limit cheese to 1 oz (28 g) per day.  · Eat more home-cooked food and less restaurant, buffet, and fast food.  · Limit fried foods.  · Cook foods using methods other than frying.  · Limit canned vegetables. If you do use them, rinse them well to decrease the sodium.  · When eating at a restaurant, ask that your food be prepared with less salt, or no salt if possible.  WHAT FOODS CAN I EAT?  Seek help from a dietitian for individual calorie needs.  Grains  Whole grain or whole wheat bread. Brown rice. Whole grain or whole wheat pasta. Quinoa, bulgur, and whole grain cereals. Low-sodium cereals. Corn or whole wheat flour tortillas. Whole grain cornbread. Whole grain crackers. Low-sodium crackers.  Vegetables  Fresh or frozen vegetables  (raw, steamed, roasted, or grilled). Low-sodium or reduced-sodium tomato and vegetable juices. Low-sodium or reduced-sodium tomato sauce and paste. Low-sodium or reduced-sodium canned vegetables.   Fruits  All fresh, canned (in natural juice), or frozen fruits.  Meat and Other Protein Products  Ground beef (85% or leaner), grass-fed beef, or beef trimmed of fat. Skinless chicken or turkey. Ground chicken or turkey. Pork trimmed of fat. All fish and seafood. Eggs. Dried beans, peas, or lentils. Unsalted nuts and seeds. Unsalted canned beans.  Dairy  Low-fat dairy products, such as skim or 1% milk, 2% or reduced-fat cheeses, low-fat ricotta or cottage cheese, or plain low-fat yogurt. Low-sodium or reduced-sodium cheeses.  Fats and Oils  Tub margarines without trans fats. Light or reduced-fat mayonnaise and salad dressings (reduced sodium). Avocado. Safflower, olive, or canola oils. Natural peanut or almond butter.  Other  Unsalted popcorn and pretzels.  The items listed above may not be a complete list of recommended foods or beverages. Contact your dietitian for more options.  WHAT FOODS ARE NOT RECOMMENDED?  Grains  White bread. White pasta. White rice. Refined cornbread. Bagels and croissants. Crackers that contain trans fat.  Vegetables  Creamed or fried vegetables. Vegetables in a cheese sauce. Regular canned vegetables. Regular canned tomato sauce and paste. Regular tomato and vegetable juices.  Fruits  Dried fruits. Canned fruit in light or heavy syrup. Fruit juice.  Meat and Other Protein   Products  Fatty cuts of meat. Ribs, chicken wings, bacon, sausage, bologna, salami, chitterlings, fatback, hot dogs, bratwurst, and packaged luncheon meats. Salted nuts and seeds. Canned beans with salt.  Dairy  Whole or 2% milk, cream, half-and-half, and cream cheese. Whole-fat or sweetened yogurt. Full-fat cheeses or blue cheese. Nondairy creamers and whipped toppings. Processed cheese, cheese spreads, or cheese  curds.  Condiments  Onion and garlic salt, seasoned salt, table salt, and sea salt. Canned and packaged gravies. Worcestershire sauce. Tartar sauce. Barbecue sauce. Teriyaki sauce. Soy sauce, including reduced sodium. Steak sauce. Fish sauce. Oyster sauce. Cocktail sauce. Horseradish. Ketchup and mustard. Meat flavorings and tenderizers. Bouillon cubes. Hot sauce. Tabasco sauce. Marinades. Taco seasonings. Relishes.  Fats and Oils  Butter, stick margarine, lard, shortening, ghee, and bacon fat. Coconut, palm kernel, or palm oils. Regular salad dressings.  Other  Pickles and olives. Salted popcorn and pretzels.  The items listed above may not be a complete list of foods and beverages to avoid. Contact your dietitian for more information.  WHERE CAN I FIND MORE INFORMATION?  National Heart, Lung, and Blood Institute: www.nhlbi.nih.gov/health/health-topics/topics/dash/     This information is not intended to replace advice given to you by your health care provider. Make sure you discuss any questions you have with your health care provider.     Document Released: 07/16/2011 Document Revised: 08/17/2014 Document Reviewed: 05/31/2013  Elsevier Interactive Patient Education ©2016 [employer]

## 2016-03-17 ENCOUNTER — Other Ambulatory Visit: Payer: Self-pay | Admitting: Internal Medicine

## 2016-03-17 LAB — BASIC METABOLIC PANEL
BUN / CREAT RATIO: 20 (ref 9–20)
BUN: 30 mg/dL — ABNORMAL HIGH (ref 6–24)
CO2: 26 mmol/L (ref 18–29)
Calcium: 8.9 mg/dL (ref 8.7–10.2)
Chloride: 98 mmol/L (ref 96–106)
Creatinine, Ser: 1.49 mg/dL — ABNORMAL HIGH (ref 0.76–1.27)
GFR calc Af Amer: 66 mL/min/{1.73_m2} (ref >59)
GFR calc non Af Amer: 57 mL/min/{1.73_m2} — ABNORMAL LOW (ref >59)
GLUCOSE: 140 mg/dL — AB (ref 65–99)
POTASSIUM: 4 mmol/L (ref 3.5–5.2)
SODIUM: 140 mmol/L (ref 134–144)

## 2016-03-17 LAB — URIC ACID: URIC ACID: 9.5 mg/dL — AB (ref 3.7–8.6)

## 2016-03-17 LAB — HEMOGLOBIN A1C
ESTIMATED AVERAGE GLUCOSE: 186 mg/dL
HEMOGLOBIN A1C: 8.1 % — AB (ref 4.8–5.6)

## 2016-03-17 MED ORDER — ALLOPURINOL 300 MG PO TABS: 300.0000 mg | ORAL_TABLET | Freq: Every day | ORAL | 5 refills | Status: DC

## 2016-03-17 MED ORDER — METFORMIN HCL 500 MG PO TABS: 500.0000 mg | ORAL_TABLET | Freq: Two times a day (BID) | ORAL | 5 refills | Status: DC

## 2016-05-29 ENCOUNTER — Other Ambulatory Visit: Payer: 59

## 2016-05-29 DIAGNOSIS — C61 Malignant neoplasm of prostate: Secondary | ICD-10-CM

## 2016-05-30 LAB — PSA: Prostate Specific Ag, Serum: 5.6 ng/mL — ABNORMAL HIGH (ref 0.0–4.0)

## 2016-06-05 ENCOUNTER — Ambulatory Visit: Payer: 59

## 2016-06-12 ENCOUNTER — Encounter: Payer: Self-pay | Admitting: Urology

## 2016-06-12 ENCOUNTER — Ambulatory Visit (INDEPENDENT_AMBULATORY_CARE_PROVIDER_SITE_OTHER): Payer: 59 | Admitting: Urology

## 2016-06-12 VITALS — BP 152/96 | HR 103 | Ht 72.0 in | Wt >= 6400 oz

## 2016-06-12 DIAGNOSIS — C61 Malignant neoplasm of prostate: Secondary | ICD-10-CM

## 2016-06-12 NOTE — Progress Notes (Signed)
06/12/2016 3:58 PM   Benjamin Stewart Sep 03, 1975 [id number]  Referring provider: Glean Hess, MD [address] [place], [place] [address]  Chief Complaint  Patient presents with  . Follow-up    prostate cancer     HPI: The patient is a 40 year old gentleman who presents today to discuss  PSA results.  1. Prostate cancer Biopsy #1: Gleason 3+3 = 6 prostate cancer.  7/12 cores positive. 5 of the cores ranging from 1-9%. He had 2 cores of approximately 37%.  Biopsy#2: Gleason 3+3 = 6 prostate cancer. 5/12 cores positive 1 core was approximately 33%. The remaining 4 ranged from 3-8%  10/12 biopsy regions with at least one positive specimen on one of the above biopsys (LMB and RLB only regions without positive core on both biopsies)  PSA is 5.6 in October 2017. It was 6.5 in March 2017 at the time of his prostate biopsy.  2. Microscopic Hematuria The patient has a history of microscopic hematuria. He saw urologist in 2013 and had a cystoscopy and CT at that time which was negative.  3. History of nephrolithiasis The patient passed a kidney stone in 2001. This was his only stone. He is having no symptoms of a kidney stone at this time.     PMH: Past Medical History:  Diagnosis Date  . Benign essential hematuria 12/04/2014   microscopic hematuria evaluation with CT and cysto - treated for 2 months with bactrim DS; no further f/u planned unless hematuria recurs.   . Controlled type 2 diabetes mellitus without complication, without long-term current use of insulin ([place]) 07/16/2015  . Essential (primary) hypertension 12/04/2014    Surgical History: Past Surgical History:  Procedure Laterality Date  . PARTIAL COLECTOMY  2005    Home Medications:    Medication List       Accurate as of 06/12/16  3:58 PM. Always use your most recent med list.          allopurinol 300 MG tablet Commonly known as:  ZYLOPRIM Take 1 tablet (300 mg total) by mouth  daily.   amLODipine 10 MG tablet Commonly known as:  NORVASC Take 1 tablet (10 mg total) by mouth daily.   atorvastatin 10 MG tablet Commonly known as:  LIPITOR Take 1 tablet (10 mg total) by mouth daily with breakfast.   colchicine 0.6 MG tablet One twice a day for gout flare - up to 5 days   hydrochlorothiazide 25 MG tablet Commonly known as:  HYDRODIURIL Take 2 tablets (50 mg total) by mouth daily.   irbesartan 300 MG tablet Commonly known as:  AVAPRO Take 1 tablet (300 mg total) by mouth daily.   metFORMIN 500 MG tablet Commonly known as:  GLUCOPHAGE Take 1 tablet (500 mg total) by mouth 2 (two) times daily with a meal.       Allergies:  Allergies  Allergen Reactions  . Percocet [Oxycodone-Acetaminophen] Nausea And Vomiting    Family History: Family History  Problem Relation Age of Onset  . Diabetes Mother   . Heart disease Mother   . Hypertension Mother   . Diabetes Maternal Grandmother   . Diabetes Maternal Grandfather   . Prostate cancer Maternal Uncle     Social History:  reports that he has never smoked. He does not have any smokeless tobacco history on file. He reports that he does not drink alcohol or use drugs.  ROS: UROLOGY Frequent Urination?: No Hard to postpone urination?: No Burning/pain with urination?: No  Get up at night to urinate?: Yes Leakage of urine?: No Urine stream starts and stops?: No Trouble starting stream?: No Do you have to strain to urinate?: No Blood in urine?: No Urinary tract infection?: No Sexually transmitted disease?: No Injury to kidneys or bladder?: No Painful intercourse?: No Weak stream?: No Erection problems?: No Penile pain?: No  Gastrointestinal Nausea?: No Vomiting?: No Indigestion/heartburn?: No Diarrhea?: No Constipation?: No  Constitutional Fever: No Night sweats?: No Weight loss?: No Fatigue?: No  Skin Skin rash/lesions?: No Itching?: No  Eyes Blurred vision?: No Double vision?:  No  Ears/Nose/Throat Sore throat?: No Sinus problems?: No  Hematologic/Lymphatic Swollen glands?: No Easy bruising?: No  Cardiovascular Leg swelling?: No Chest pain?: No  Respiratory Cough?: No Shortness of breath?: No  Endocrine Excessive thirst?: No  Musculoskeletal Back pain?: No Joint pain?: No  Neurological Headaches?: No Dizziness?: No  Psychologic Depression?: No Anxiety?: No  Physical Exam: BP (!) 152/96   Pulse (!) 103   Ht 6' (1.829 m)   Wt (!) 422 lb 3.2 oz (191.5 kg)   BMI 57.26 kg/m   Constitutional:  Alert and oriented, No acute distress. HEENT: [place] AT, moist mucus membranes.  Trachea midline, no masses. Cardiovascular: No clubbing, cyanosis, or edema. Respiratory: Normal respiratory effort, no increased work of breathing. GI: Abdomen is soft, nontender, nondistended, no abdominal masses GU: No CVA tenderness.  Skin: No rashes, bruises or suspicious lesions. Lymph: No cervical or inguinal adenopathy. Neurologic: Grossly intact, no focal deficits, moving all 4 extremities. Psychiatric: Normal mood and affect.  Laboratory Data: Lab Results  Component Value Date   WBC 13.1 (H) 07/16/2015   HGB 14.6 05/14/2015   HCT 40.9 07/16/2015   MCV 87 07/16/2015   PLT 373 07/16/2015    Lab Results  Component Value Date   CREATININE 1.49 (H) 03/16/2016    No results found for: PSA  No results found for: TESTOSTERONE  Lab Results  Component Value Date   HGBA1C 8.1 (H) 03/16/2016    Urinalysis    Component Value Date/Time   COLORURINE PINK 11/23/2012 0823   APPEARANCEUR [place] (A) 10/24/2015 0930   LABSPEC 1.010 11/23/2012 0823   PHURINE 7.5 11/23/2012 0823   GLUCOSEU Negative 10/24/2015 0930   GLUCOSEU NEGATIVE 11/23/2012 0823   HGBUR 3+ 11/23/2012 0823   BILIRUBINUR Negative 10/24/2015 0930   BILIRUBINUR NEGATIVE 11/23/2012 0823   KETONESUR NEGATIVE 11/23/2012 0823   PROTEINUR 3+ (A) 10/24/2015 0930   PROTEINUR 100 mg/dL 11/23/2012  0823   UROBILINOGEN 0.2 07/16/2015 0846   NITRITE Negative 10/24/2015 0930   NITRITE NEGATIVE 11/23/2012 0823   LEUKOCYTESUR Negative 10/24/2015 0930   LEUKOCYTESUR 1+ 11/23/2012 0823    Assessment & Plan:    1. Low risk prostate cancer -continue active surveillance with follow up in 6 months with PSA prior  Return for PSA prior.  Nickie Retort, MD  [facility] [address] [place],  [address] [phone]

## 2016-08-18 ENCOUNTER — Encounter: Payer: 59 | Admitting: Internal Medicine

## 2016-08-18 ENCOUNTER — Other Ambulatory Visit: Payer: Self-pay | Admitting: Internal Medicine

## 2016-08-18 ENCOUNTER — Ambulatory Visit (INDEPENDENT_AMBULATORY_CARE_PROVIDER_SITE_OTHER): Payer: 59 | Admitting: Internal Medicine

## 2016-08-18 ENCOUNTER — Encounter: Payer: Self-pay | Admitting: Internal Medicine

## 2016-08-18 VITALS — BP 136/82 | HR 82 | Temp 98.6°F | Ht 72.0 in | Wt >= 6400 oz

## 2016-08-18 DIAGNOSIS — N029 Recurrent and persistent hematuria with unspecified morphologic changes: Secondary | ICD-10-CM

## 2016-08-18 DIAGNOSIS — C61 Malignant neoplasm of prostate: Secondary | ICD-10-CM

## 2016-08-18 DIAGNOSIS — R319 Hematuria, unspecified: Secondary | ICD-10-CM

## 2016-08-18 DIAGNOSIS — M109 Gout, unspecified: Secondary | ICD-10-CM

## 2016-08-18 DIAGNOSIS — E1129 Type 2 diabetes mellitus with other diabetic kidney complication: Secondary | ICD-10-CM | POA: Diagnosis not present

## 2016-08-18 DIAGNOSIS — E1169 Type 2 diabetes mellitus with other specified complication: Secondary | ICD-10-CM

## 2016-08-18 DIAGNOSIS — E785 Hyperlipidemia, unspecified: Secondary | ICD-10-CM

## 2016-08-18 DIAGNOSIS — Z0000 Encounter for general adult medical examination without abnormal findings: Secondary | ICD-10-CM

## 2016-08-18 DIAGNOSIS — Z23 Encounter for immunization: Secondary | ICD-10-CM

## 2016-08-18 DIAGNOSIS — N183 Chronic kidney disease, stage 3 unspecified: Secondary | ICD-10-CM

## 2016-08-18 DIAGNOSIS — I10 Essential (primary) hypertension: Secondary | ICD-10-CM

## 2016-08-18 LAB — POCT URINALYSIS DIPSTICK
BILIRUBIN UA: NEGATIVE
GLUCOSE UA: NEGATIVE
KETONES UA: NEGATIVE
Leukocytes, UA: NEGATIVE
NITRITE UA: NEGATIVE
PH UA: 5
Protein, UA: 2000
Urobilinogen, UA: 0.2

## 2016-08-18 NOTE — Progress Notes (Signed)
Date:  08/18/2016   Name:  Benjamin Stewart   DOB:  Feb 12, 1976   MRN:  [id number]   Chief Complaint: Annual Exam Benjamin Stewart is a 41 y.o. male who presents today for his Complete Annual Exam. He feels fairly well. He reports exercising none. He reports he is sleeping fairly well. No recent gout episodes.  He did not get flu vaccine yet this season.  Hypertension  This is a chronic problem. The current episode started more than 1 year ago. Pertinent negatives include no chest pain, headaches, palpitations or shortness of breath. Risk factors for coronary artery disease include diabetes mellitus and dyslipidemia.  Hyperlipidemia  This is a chronic problem. The problem is controlled. Pertinent negatives include no chest pain, myalgias or shortness of breath. Current antihyperlipidemic treatment includes statins. The current treatment provides significant improvement of lipids.  Diabetes  He presents for his follow-up diabetic visit. He has type 2 diabetes mellitus. Pertinent negatives for hypoglycemia include no dizziness or headaches. Pertinent negatives for diabetes include no chest pain and no fatigue. His weight is stable. He monitors urine at home 1-2 x per week. His breakfast blood glucose is taken between 7-8 am. His breakfast blood glucose range is generally 140-180 mg/dl.  Prostate cancer - he is followed by URology.  Recent PSA was stable and biopsy showed less burden than previously. Still employing watchful waiting and will have repeat PSA in May.  Lab Results  Component Value Date   HGBA1C 8.1 (H) 03/16/2016   Lab Results  Component Value Date   CREATININE 1.49 (H) 03/16/2016   Lab Results  Component Value Date   CHOL 138 07/16/2015   HDL 35 (L) 07/16/2015   LDLCALC 81 07/16/2015   TRIG 110 07/16/2015   CHOLHDL 3.9 07/16/2015     Review of Systems  Constitutional: Negative for appetite change, chills, diaphoresis, fatigue and unexpected weight change.  HENT:  Negative for hearing loss, tinnitus, trouble swallowing and voice change.   Eyes: Negative for visual disturbance.  Respiratory: Negative for choking, shortness of breath and wheezing.   Cardiovascular: Negative for chest pain, palpitations and leg swelling.  Gastrointestinal: Negative for abdominal pain, blood in stool, constipation and diarrhea.  Genitourinary: Negative for difficulty urinating, dysuria and frequency.  Musculoskeletal: Negative for arthralgias, back pain and myalgias.  Skin: Negative for color change and rash.  Neurological: Negative for dizziness, syncope and headaches.  Hematological: Negative for adenopathy.  Psychiatric/Behavioral: Negative for dysphoric mood and sleep disturbance.    Patient Active Problem List   Diagnosis Date Noted  . Prostate cancer ([place]) 08/23/2015  . DM (diabetes mellitus) type II controlled with renal manifestation ([place]) 07/16/2015  . Hyperlipidemia associated with type 2 diabetes mellitus ([place]) 07/16/2015  . Acute gouty arthropathy 12/04/2014  . Benign essential hematuria 12/04/2014  . Essential (primary) hypertension 12/04/2014  . Extreme obesity ([place]) 12/04/2014  . CKD (chronic kidney disease), stage III 12/04/2014    Prior to Admission medications   Medication Sig Start Date End Date Taking? Authorizing Provider  allopurinol (ZYLOPRIM) 300 MG tablet Take 1 tablet (300 mg total) by mouth daily. 03/17/16  Yes Glean Hess, MD  amLODipine (NORVASC) 10 MG tablet Take 1 tablet (10 mg total) by mouth daily. 03/16/16  Yes Glean Hess, MD  atorvastatin (LIPITOR) 10 MG tablet Take 1 tablet (10 mg total) by mouth daily with breakfast. 03/16/16  Yes Glean Hess, MD  hydrochlorothiazide (HYDRODIURIL) 25 MG tablet Take 2 tablets (  50 mg total) by mouth daily. 03/16/16  Yes Glean Hess, MD  irbesartan (AVAPRO) 300 MG tablet Take 1 tablet (300 mg total) by mouth daily. 03/16/16  Yes Glean Hess, MD  metFORMIN (GLUCOPHAGE) 500 MG tablet  Take 1 tablet (500 mg total) by mouth 2 (two) times daily with a meal. 03/17/16  Yes Glean Hess, MD  colchicine 0.6 MG tablet One twice a day for gout flare - up to 5 days Patient not taking: Reported on 08/18/2016 11/11/15   Glean Hess, MD    Allergies  Allergen Reactions  . Percocet [Oxycodone-Acetaminophen] Nausea And Vomiting    Past Surgical History:  Procedure Laterality Date  . PARTIAL COLECTOMY  2005    Social History  Substance Use Topics  . Smoking status: Never Smoker  . Smokeless tobacco: Never Used  . Alcohol use No     Medication list has been reviewed and updated.   Physical Exam  Constitutional: He is oriented to person, place, and time. He appears well-developed and well-nourished.  HENT:  Head: Normocephalic.  Right Ear: Tympanic membrane, external ear and ear canal normal.  Left Ear: Tympanic membrane, external ear and ear canal normal.  Nose: Nose normal.  Mouth/Throat: Uvula is midline and oropharynx is clear and moist.  Eyes: Conjunctivae and EOM are normal. Pupils are equal, round, and reactive to light.  Neck: Normal range of motion. Neck supple. Carotid bruit is not present. No thyromegaly present.  Cardiovascular: Normal rate, regular rhythm, normal heart sounds and intact distal pulses.   Pulmonary/Chest: Effort normal and breath sounds normal. He has no wheezes. Right breast exhibits no mass. Left breast exhibits no mass.  Abdominal: Soft. Normal appearance and bowel sounds are normal. There is no hepatosplenomegaly. There is no tenderness.  Musculoskeletal: Normal range of motion.  Lymphadenopathy:    He has no cervical adenopathy.  Neurological: He is alert and oriented to person, place, and time. He has normal reflexes.  Skin: Skin is warm, dry and intact.  Psychiatric: He has a normal mood and affect. His speech is normal and behavior is normal. Judgment and thought content normal.  Nursing note and vitals reviewed.   BP 136/82    Pulse 82   Temp 98.6 F (37 C)   Ht 6' (1.829 m)   Wt (!) 422 lb (191.4 kg)   SpO2 98%   BMI 57.23 kg/m   Assessment and Plan: 1. Annual physical exam Encourage exercise and weight loss - POCT urinalysis dipstick  2. Essential (primary) hypertension controlled - CBC with Differential/Platelet  3. 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 CKD (chronic kidney disease), stage III Monitor at intervals May need to stop allopurinol and start Uloric  5. Hyperlipidemia associated with type 2 diabetes mellitus ([place]) On statin therapy - Lipid panel  6. Acute gouty arthropathy Stable with no recent episodes - Uric acid  7. Benign essential hematuria Followed by Urology  8. Prostate cancer [facility]) Followed by Urology   Halina Maidens, MD [facility]  08/18/2016

## 2016-08-18 NOTE — Patient Instructions (Signed)

## 2016-08-19 ENCOUNTER — Other Ambulatory Visit: Payer: Self-pay | Admitting: Internal Medicine

## 2016-08-19 LAB — COMPREHENSIVE METABOLIC PANEL
ALK PHOS: 62 IU/L (ref 39–117)
ALT: 16 IU/L (ref 0–44)
AST: 20 IU/L (ref 0–40)
Albumin/Globulin Ratio: 1.2 (ref 1.2–2.2)
Albumin: 3.3 g/dL — ABNORMAL LOW (ref 3.5–5.5)
BUN/Creatinine Ratio: 20 (ref 9–20)
BUN: 49 mg/dL — ABNORMAL HIGH (ref 6–24)
Bilirubin Total: 0.4 mg/dL (ref 0.0–1.2)
CALCIUM: 9.2 mg/dL (ref 8.7–10.2)
CO2: 25 mmol/L (ref 18–29)
CREATININE: 2.49 mg/dL — AB (ref 0.76–1.27)
Chloride: 98 mmol/L (ref 96–106)
GFR calc Af Amer: 35 mL/min/{1.73_m2} — ABNORMAL LOW (ref >59)
GFR calc non Af Amer: 31 mL/min/{1.73_m2} — ABNORMAL LOW (ref >59)
Globulin, Total: 2.8 g/dL (ref 1.5–4.5)
Glucose: 117 mg/dL — ABNORMAL HIGH (ref 65–99)
POTASSIUM: 4 mmol/L (ref 3.5–5.2)
Sodium: 139 mmol/L (ref 134–144)
Total Protein: 6.1 g/dL (ref 6.0–8.5)

## 2016-08-19 LAB — CBC WITH DIFFERENTIAL/PLATELET
BASOS ABS: 0.1 10*3/uL (ref 0.0–0.2)
Basos: 1 %
EOS (ABSOLUTE): 0.4 10*3/uL (ref 0.0–0.4)
Eos: 3 %
Hematocrit: 34.2 % — ABNORMAL LOW (ref 37.5–51.0)
Hemoglobin: 11.8 g/dL — ABNORMAL LOW (ref 13.0–17.7)
IMMATURE GRANULOCYTES: 0 %
Immature Grans (Abs): 0 10*3/uL (ref 0.0–0.1)
Lymphocytes Absolute: 2.7 10*3/uL (ref 0.7–3.1)
Lymphs: 19 %
MCH: 29.3 pg (ref 26.6–33.0)
MCHC: 34.5 g/dL (ref 31.5–35.7)
MCV: 85 fL (ref 79–97)
Monocytes Absolute: 1 10*3/uL — ABNORMAL HIGH (ref 0.1–0.9)
Monocytes: 7 %
NEUTROS PCT: 70 %
Neutrophils Absolute: 9.6 10*3/uL — ABNORMAL HIGH (ref 1.4–7.0)
PLATELETS: 347 10*3/uL (ref 150–379)
RBC: 4.03 x10E6/uL — AB (ref 4.14–5.80)
RDW: 14.5 % (ref 12.3–15.4)
WBC: 13.8 10*3/uL — AB (ref 3.4–10.8)

## 2016-08-19 LAB — HEMOGLOBIN A1C
Est. average glucose Bld gHb Est-mCnc: 183 mg/dL
HEMOGLOBIN A1C: 8 % — AB (ref 4.8–5.6)

## 2016-08-19 LAB — LIPID PANEL
CHOLESTEROL TOTAL: 160 mg/dL (ref 100–199)
Chol/HDL Ratio: 4.7 ratio units (ref 0.0–5.0)
HDL: 34 mg/dL — ABNORMAL LOW (ref >39)
LDL CALC: 100 mg/dL — AB (ref 0–99)
TRIGLYCERIDES: 131 mg/dL (ref 0–149)
VLDL Cholesterol Cal: 26 mg/dL (ref 5–40)

## 2016-08-19 LAB — URIC ACID: Uric Acid: 6.8 mg/dL (ref 3.7–8.6)

## 2016-08-19 MED ORDER — GLIMEPIRIDE 2 MG PO TABS: 2.0000 mg | ORAL_TABLET | Freq: Every day | ORAL | 2 refills | Status: DC

## 2016-08-20 LAB — MICROALBUMIN / CREATININE URINE RATIO
Creatinine, Urine: 137.4 mg/dL
Microalb/Creat Ratio: 1450.8 mg/g creat — ABNORMAL HIGH (ref 0.0–30.0)
Microalbumin, Urine: 1993.4 ug/mL

## 2016-08-26 ENCOUNTER — Encounter: Payer: Self-pay | Admitting: Internal Medicine

## 2016-10-10 ENCOUNTER — Encounter: Payer: Self-pay | Admitting: Internal Medicine

## 2016-10-19 ENCOUNTER — Encounter: Payer: Self-pay | Admitting: Internal Medicine

## 2016-10-19 ENCOUNTER — Ambulatory Visit (INDEPENDENT_AMBULATORY_CARE_PROVIDER_SITE_OTHER): Payer: 59 | Admitting: Internal Medicine

## 2016-10-19 VITALS — BP 148/80 | HR 116 | Ht 72.0 in | Wt >= 6400 oz

## 2016-10-19 DIAGNOSIS — I10 Essential (primary) hypertension: Secondary | ICD-10-CM | POA: Diagnosis not present

## 2016-10-19 DIAGNOSIS — E1165 Type 2 diabetes mellitus with hyperglycemia: Secondary | ICD-10-CM | POA: Diagnosis not present

## 2016-10-19 DIAGNOSIS — M109 Gout, unspecified: Secondary | ICD-10-CM | POA: Diagnosis not present

## 2016-10-19 DIAGNOSIS — N183 Chronic kidney disease, stage 3 unspecified: Secondary | ICD-10-CM

## 2016-10-19 DIAGNOSIS — IMO0002 Reserved for concepts with insufficient information to code with codable children: Secondary | ICD-10-CM

## 2016-10-19 DIAGNOSIS — E1129 Type 2 diabetes mellitus with other diabetic kidney complication: Secondary | ICD-10-CM

## 2016-10-19 NOTE — Progress Notes (Signed)
Date:  10/19/2016   Name:  Benjamin Stewart   DOB:  1975/08/12   MRN:  [id number]   Chief Complaint: Diabetes (BS yesterday was 118. No problems with new medication.) Diabetes  He presents for his follow-up diabetic visit. He has type 2 diabetes mellitus. His disease course has been improving. Pertinent negatives for hypoglycemia include no headaches or tremors. Pertinent negatives for diabetes include no chest pain, no fatigue, no polydipsia and no polyuria. Diabetic complications include nephropathy.  Glimepiride was added for DM control. Renal function was decreased so Allopurinol was stopped.  He has had no gout episodes. His hct was slightly decreased - he denies bleeding or bruising.  His BP at home are normal ~ 130/80.    Review of Systems  Constitutional: Negative for appetite change, fatigue and unexpected weight change.  Eyes: Negative for visual disturbance.  Respiratory: Negative for cough, shortness of breath and wheezing.   Cardiovascular: Negative for chest pain, palpitations and leg swelling.  Gastrointestinal: Negative for abdominal pain and blood in stool.  Endocrine: Negative for polydipsia and polyuria.  Genitourinary: Negative for dysuria and hematuria.  Skin: Negative for color change and rash.  Neurological: Negative for tremors, numbness and headaches.  Psychiatric/Behavioral: Negative for dysphoric mood.    Patient Active Problem List   Diagnosis Date Noted  . Prostate cancer ([place]) 08/23/2015  . DM (diabetes mellitus), type 2, uncontrolled, with renal complications ([place]) 93/26/7124  . Hyperlipidemia associated with type 2 diabetes mellitus ([place]) 07/16/2015  . Acute gouty arthropathy 12/04/2014  . Benign essential hematuria 12/04/2014  . Essential (primary) hypertension 12/04/2014  . Extreme obesity ([place]) 12/04/2014  . CKD (chronic kidney disease), stage III 12/04/2014    Prior to Admission medications   Medication Sig Start Date End Date Taking?  Authorizing Provider  amLODipine (NORVASC) 10 MG tablet Take 1 tablet (10 mg total) by mouth daily. 03/16/16  Yes Glean Hess, MD  atorvastatin (LIPITOR) 10 MG tablet Take 1 tablet (10 mg total) by mouth daily with breakfast. 03/16/16  Yes Glean Hess, MD  colchicine 0.6 MG tablet One twice a day for gout flare - up to 5 days 11/11/15  Yes Glean Hess, MD  glimepiride (AMARYL) 2 MG tablet Take 1 tablet (2 mg total) by mouth daily before breakfast. 08/19/16  Yes Glean Hess, MD  hydrochlorothiazide (HYDRODIURIL) 25 MG tablet Take 2 tablets (50 mg total) by mouth daily. 03/16/16  Yes Glean Hess, MD  irbesartan (AVAPRO) 300 MG tablet Take 1 tablet (300 mg total) by mouth daily. 03/16/16  Yes Glean Hess, MD  metFORMIN (GLUCOPHAGE) 500 MG tablet Take 1 tablet (500 mg total) by mouth 2 (two) times daily with a meal. 03/17/16  Yes Glean Hess, MD    Allergies  Allergen Reactions  . Percocet [Oxycodone-Acetaminophen] Nausea And Vomiting    Past Surgical History:  Procedure Laterality Date  . PARTIAL COLECTOMY  2005    Social History  Substance Use Topics  . Smoking status: Never Smoker  . Smokeless tobacco: Never Used  . Alcohol use No   BP Readings from Last 3 Encounters:  10/19/16 (!) 148/80  08/18/16 136/82  06/12/16 (!) 152/96    Medication list has been reviewed and updated.   Physical Exam  Constitutional: He is oriented to person, place, and time. He appears well-developed. No distress.  HENT:  Head: Normocephalic and atraumatic.  Neck: Normal range of motion. Neck supple.  Cardiovascular: Normal rate,  regular rhythm and normal heart sounds.   Pulmonary/Chest: Effort normal and breath sounds normal. No respiratory distress.  Musculoskeletal: Normal range of motion. He exhibits no edema or tenderness.  Neurological: He is alert and oriented to person, place, and time.  Skin: Skin is warm and dry. No rash noted.  Psychiatric: He has a normal mood and  affect. His behavior is normal. Thought content normal.  Nursing note and vitals reviewed.   BP (!) 148/80   Pulse (!) 116   Ht 6' (1.829 m)   Wt (!) 408 lb (185.1 kg)   SpO2 98%   BMI 55.33 kg/m   Assessment and Plan: 1. Essential (primary) hypertension Continue to monitor at home for now Consider med change next visit - CBC with Differential/Platelet  2. Uncontrolled type 2 diabetes mellitus with other diabetic kidney complication, without long-term current use of insulin ([facility]) Now off allopurinol - Hemoglobin A1c  3. CKD (chronic kidney disease), stage III - Basic metabolic panel  4. Acute gouty arthropathy No recent episodes - will consider Uloric   No orders of the defined types were placed in this encounter.   Halina Maidens, MD [facility]  10/19/2016

## 2016-10-20 LAB — CBC WITH DIFFERENTIAL/PLATELET
Basophils Absolute: 0.1 10*3/uL (ref 0.0–0.2)
Basos: 1 %
EOS (ABSOLUTE): 0.7 10*3/uL — AB (ref 0.0–0.4)
EOS: 7 %
Hematocrit: 39.9 % (ref 37.5–51.0)
Hemoglobin: 13.1 g/dL (ref 13.0–17.7)
IMMATURE GRANS (ABS): 0 10*3/uL (ref 0.0–0.1)
IMMATURE GRANULOCYTES: 0 %
LYMPHS: 21 %
Lymphocytes Absolute: 2.1 10*3/uL (ref 0.7–3.1)
MCH: 28.7 pg (ref 26.6–33.0)
MCHC: 32.8 g/dL (ref 31.5–35.7)
MCV: 87 fL (ref 79–97)
MONOS ABS: 0.9 10*3/uL (ref 0.1–0.9)
Monocytes: 9 %
NEUTROS PCT: 62 %
Neutrophils Absolute: 6.5 10*3/uL (ref 1.4–7.0)
PLATELETS: 395 10*3/uL — AB (ref 150–379)
RBC: 4.57 x10E6/uL (ref 4.14–5.80)
RDW: 14.7 % (ref 12.3–15.4)
WBC: 10.3 10*3/uL (ref 3.4–10.8)

## 2016-10-20 LAB — BASIC METABOLIC PANEL
BUN/Creatinine Ratio: 18 (ref 9–20)
BUN: 37 mg/dL — AB (ref 6–24)
CALCIUM: 9.7 mg/dL (ref 8.7–10.2)
CHLORIDE: 100 mmol/L (ref 96–106)
CO2: 27 mmol/L (ref 18–29)
Creatinine, Ser: 2.09 mg/dL — ABNORMAL HIGH (ref 0.76–1.27)
GFR calc Af Amer: 44 mL/min/{1.73_m2} — ABNORMAL LOW (ref >59)
GFR calc non Af Amer: 38 mL/min/{1.73_m2} — ABNORMAL LOW (ref >59)
Glucose: 131 mg/dL — ABNORMAL HIGH (ref 65–99)
Potassium: 4.2 mmol/L (ref 3.5–5.2)
Sodium: 142 mmol/L (ref 134–144)

## 2016-10-20 LAB — HEMOGLOBIN A1C
ESTIMATED AVERAGE GLUCOSE: 143 mg/dL
HEMOGLOBIN A1C: 6.6 % — AB (ref 4.8–5.6)

## 2016-10-24 ENCOUNTER — Other Ambulatory Visit: Payer: Self-pay | Admitting: Internal Medicine

## 2016-10-24 DIAGNOSIS — I10 Essential (primary) hypertension: Secondary | ICD-10-CM

## 2016-10-26 ENCOUNTER — Other Ambulatory Visit: Payer: Self-pay | Admitting: Internal Medicine

## 2016-10-28 ENCOUNTER — Encounter: Payer: Self-pay | Admitting: Internal Medicine

## 2016-10-29 ENCOUNTER — Encounter: Payer: Self-pay | Admitting: Internal Medicine

## 2016-10-30 ENCOUNTER — Ambulatory Visit (INDEPENDENT_AMBULATORY_CARE_PROVIDER_SITE_OTHER): Payer: 59 | Admitting: Internal Medicine

## 2016-10-30 ENCOUNTER — Encounter: Payer: Self-pay | Admitting: Internal Medicine

## 2016-10-30 VITALS — BP 132/82 | HR 92 | Ht 73.0 in | Wt >= 6400 oz

## 2016-10-30 DIAGNOSIS — M722 Plantar fascial fibromatosis: Secondary | ICD-10-CM

## 2016-10-30 MED ORDER — PREDNISONE 10 MG PO TABS: ORAL_TABLET | ORAL | 0 refills | Status: DC

## 2016-10-30 NOTE — Progress Notes (Signed)
Date:  10/30/2016   Name:  Benjamin Stewart   DOB:  02/03/1976   MRN:  [id number]   Chief Complaint: Foot Pain (Rt foot. As long as moving pain is okay, but sit for periods of time and the pain gets worse again. Does not feel like gout pain. ) Foot Pain  This is a new problem. The current episode started in the past 7 days. The problem occurs constantly. The problem has been waxing and waning. Associated symptoms include arthralgias. Pertinent negatives include no chest pain, chills, coughing, fatigue, joint swelling or numbness. The symptoms are aggravated by standing and walking. He has tried nothing for the symptoms.      Review of Systems  Constitutional: Negative for chills and fatigue.  Respiratory: Negative for cough and wheezing.   Cardiovascular: Positive for leg swelling. Negative for chest pain.  Musculoskeletal: Positive for arthralgias. Negative for joint swelling.  Neurological: Negative for numbness.    Patient Active Problem List   Diagnosis Date Noted  . Prostate cancer ([place]) 08/23/2015  . DM (diabetes mellitus), type 2, uncontrolled, with renal complications ([place]) 95/62/1308  . Hyperlipidemia associated with type 2 diabetes mellitus ([place]) 07/16/2015  . Acute gouty arthropathy 12/04/2014  . Benign essential hematuria 12/04/2014  . Essential (primary) hypertension 12/04/2014  . Extreme obesity ([place]) 12/04/2014  . CKD (chronic kidney disease), stage III 12/04/2014    Prior to Admission medications   Medication Sig Start Date End Date Taking? Authorizing Provider  amLODipine (NORVASC) 10 MG tablet TAKE ONE TABLET BY MOUTH ONCE DAILY 10/24/16  Yes Glean Hess, MD  atorvastatin (LIPITOR) 10 MG tablet TAKE ONE TABLET BY MOUTH ONCE DAILY WITH BREAKFAST 10/24/16  Yes Glean Hess, MD  colchicine 0.6 MG tablet One twice a day for gout flare - up to 5 days 11/11/15  Yes Glean Hess, MD  glimepiride (AMARYL) 2 MG tablet Take 1 tablet (2 mg total) by mouth daily  before breakfast. 08/19/16  Yes Glean Hess, MD  hydrochlorothiazide (HYDRODIURIL) 25 MG tablet Take 2 tablets (50 mg total) by mouth daily. 03/16/16  Yes Glean Hess, MD  irbesartan (AVAPRO) 300 MG tablet TAKE ONE TABLET BY MOUTH ONCE DAILY 10/24/16  Yes Glean Hess, MD  metFORMIN (GLUCOPHAGE) 500 MG tablet TAKE ONE TABLET BY MOUTH TWICE DAILY WITH A MEAL 10/27/16  Yes Glean Hess, MD    Allergies  Allergen Reactions  . Percocet [Oxycodone-Acetaminophen] Nausea And Vomiting    Past Surgical History:  Procedure Laterality Date  . PARTIAL COLECTOMY  2005    Social History  Substance Use Topics  . Smoking status: Never Smoker  . Smokeless tobacco: Never Used  . Alcohol use No     Medication list has been reviewed and updated.   Physical Exam  Constitutional: He is oriented to person, place, and time. He appears well-developed. No distress.  HENT:  Head: Normocephalic and atraumatic.  Pulmonary/Chest: Effort normal. No respiratory distress.  Musculoskeletal: Normal range of motion.  Tender over plantar fascia insertion on the calcaneous Mild discomfort with ROM right ankle   Neurological: He is alert and oriented to person, place, and time.  Skin: Skin is warm and dry. No rash noted.  Psychiatric: He has a normal mood and affect. His behavior is normal. Thought content normal.  Nursing note and vitals reviewed.   BP 132/82 (BP Location: Right Arm, Patient Position: Sitting, Cuff Size: Large)   Pulse 92   Ht 6'  1" (1.854 m)   Wt (!) 409 lb (185.5 kg)   SpO2 98%   BMI 53.96 kg/m   Assessment and Plan: 1. Plantar fasciitis, right Call for Podiatry referral if not significantly improved in three days Note to be out of work until 11/03/16  Meds ordered this encounter  Medications  . predniSONE (DELTASONE) 10 MG tablet    Sig: Take 6 on day 1, 5 on day 2, 4 on day 3, 3 on day 4, 2 on day 5 and 1 on day 1 then stop.    Dispense:  21 tablet    Refill:  0     Halina Maidens, MD [facility]  10/30/2016

## 2016-10-30 NOTE — Patient Instructions (Signed)

## 2016-11-03 ENCOUNTER — Encounter: Payer: Self-pay | Admitting: Internal Medicine

## 2016-11-03 DIAGNOSIS — E119 Type 2 diabetes mellitus without complications: Secondary | ICD-10-CM | POA: Diagnosis not present

## 2016-11-25 ENCOUNTER — Other Ambulatory Visit: Payer: Self-pay | Admitting: Internal Medicine

## 2016-12-10 ENCOUNTER — Other Ambulatory Visit: Payer: 59

## 2016-12-10 DIAGNOSIS — C61 Malignant neoplasm of prostate: Secondary | ICD-10-CM

## 2016-12-10 LAB — HM DIABETES EYE EXAM

## 2016-12-11 ENCOUNTER — Encounter: Payer: Self-pay | Admitting: Internal Medicine

## 2016-12-11 LAB — PSA TOTAL (REFLEX TO FREE): Prostate Specific Ag, Serum: 5.8 ng/mL — ABNORMAL HIGH (ref 0.0–4.0)

## 2016-12-11 LAB — FPSA% REFLEX
% FREE PSA: 12.8 %
PSA, FREE: 0.74 ng/mL

## 2016-12-16 ENCOUNTER — Ambulatory Visit: Payer: 59 | Admitting: Internal Medicine

## 2016-12-23 ENCOUNTER — Encounter: Payer: Self-pay | Admitting: Urology

## 2016-12-23 ENCOUNTER — Ambulatory Visit (INDEPENDENT_AMBULATORY_CARE_PROVIDER_SITE_OTHER): Payer: 59 | Admitting: Urology

## 2016-12-23 VITALS — BP 143/84 | HR 99 | Ht 73.0 in | Wt >= 6400 oz

## 2016-12-23 DIAGNOSIS — C61 Malignant neoplasm of prostate: Secondary | ICD-10-CM | POA: Diagnosis not present

## 2016-12-23 NOTE — Progress Notes (Signed)
12/23/2016 4:39 PM   Benjamin Stewart 1975/10/19 [id number]  Referring provider: Glean Hess, MD [address] [place], [place] [address]  Chief Complaint  Patient presents with  . Prostate Cancer    HPI: The patient is a 41 year old gentleman who presents today to discuss  PSA results.  1. Prostate cancer Biopsy #1: (4/17) Gleason 3+3 = 6 prostate cancer.  7/12 cores positive. 5 of the cores ranging from 1-9%. He had 2 cores of approximately 37%.  Biopsy#2: (7/17) Gleason 3+3 = 6 prostate cancer. 5/12 cores positive 1 core was approximately 33%. The remaining 4 ranged from 3-8%  10/12 biopsy regions with at least one positive specimen on one of the above biopies (LMB and RLB only regions without positive core on both biopsies)  PSA was 5.8 in May 2018. PSA is 5.6 in October 2017. It was 6.5 in March 2017 at the time of his prostate biopsy.  2. Microscopic Hematuria The patient has a history of microscopic hematuria. He saw urologist in 2013 and had a cystoscopy and CT at that time which was negative.  3. History of nephrolithiasis The patient passed a kidney stone in 2001. This was his only stone. He is having no symptoms of a kidney stone at this time.       PMH: Past Medical History:  Diagnosis Date  . Benign essential hematuria 12/04/2014   microscopic hematuria evaluation with CT and cysto - treated for 2 months with bactrim DS; no further f/u planned unless hematuria recurs.   . Controlled type 2 diabetes mellitus without complication, without long-term current use of insulin ([place]) 07/16/2015  . Essential (primary) hypertension 12/04/2014  . prostate cancer     Surgical History: Past Surgical History:  Procedure Laterality Date  . PARTIAL COLECTOMY  2005    Home Medications:  Allergies as of 12/23/2016      Reactions   Percocet [oxycodone-acetaminophen] Nausea And Vomiting      Medication List       Accurate as of 12/23/16  4:39  PM. Always use your most recent med list.          amLODipine 10 MG tablet Commonly known as:  NORVASC TAKE ONE TABLET BY MOUTH ONCE DAILY   atorvastatin 10 MG tablet Commonly known as:  LIPITOR TAKE ONE TABLET BY MOUTH ONCE DAILY WITH BREAKFAST   colchicine 0.6 MG tablet One twice a day for gout flare - up to 5 days   glimepiride 2 MG tablet Commonly known as:  AMARYL TAKE ONE TABLET BY MOUTH ONCE DAILY BEFORE BREAKFAST   hydrochlorothiazide 25 MG tablet Commonly known as:  HYDRODIURIL Take 2 tablets (50 mg total) by mouth daily.   irbesartan 300 MG tablet Commonly known as:  AVAPRO TAKE ONE TABLET BY MOUTH ONCE DAILY   metFORMIN 500 MG tablet Commonly known as:  GLUCOPHAGE TAKE ONE TABLET BY MOUTH TWICE DAILY WITH A MEAL       Allergies:  Allergies  Allergen Reactions  . Percocet [Oxycodone-Acetaminophen] Nausea And Vomiting    Family History: Family History  Problem Relation Age of Onset  . Diabetes Mother   . Heart disease Mother   . Hypertension Mother   . Diabetes Maternal Grandmother   . Diabetes Maternal Grandfather   . Prostate cancer Maternal Uncle     Social History:  reports that he has never smoked. He has never used smokeless tobacco. He reports that he does not drink alcohol or use drugs.  ROS: UROLOGY Frequent Urination?: Yes Hard to postpone urination?: No Burning/pain with urination?: No Get up at night to urinate?: Yes Leakage of urine?: No Urine stream starts and stops?: No Trouble starting stream?: No Do you have to strain to urinate?: No Blood in urine?: No Urinary tract infection?: No Sexually transmitted disease?: No Injury to kidneys or bladder?: No Painful intercourse?: No Weak stream?: No Erection problems?: No Penile pain?: No  Gastrointestinal Nausea?: No Vomiting?: No Indigestion/heartburn?: No Diarrhea?: No Constipation?: No  Constitutional Fever: No Night sweats?: No Weight loss?: No Fatigue?:  No  Skin Skin rash/lesions?: No Itching?: No  Eyes Blurred vision?: No Double vision?: No  Ears/Nose/Throat Sore throat?: No Sinus problems?: No  Hematologic/Lymphatic Swollen glands?: No Easy bruising?: No  Cardiovascular Leg swelling?: No Chest pain?: No  Respiratory Cough?: No Shortness of breath?: No  Endocrine Excessive thirst?: No  Musculoskeletal Back pain?: No Joint pain?: No  Neurological Headaches?: No Dizziness?: No  Psychologic Depression?: No Anxiety?: No  Physical Exam: BP (!) 143/84 (BP Location: Left Arm, Patient Position: Sitting, Cuff Size: Large)   Pulse 99   Ht 6\' 1"  (1.854 m)   Wt (!) 410 lb 1.6 oz (186 kg)   BMI 54.11 kg/m   Constitutional:  Alert and oriented, No acute distress. HEENT: [place] AT, moist mucus membranes.  Trachea midline, no masses. Cardiovascular: No clubbing, cyanosis, or edema. Respiratory: Normal respiratory effort, no increased work of breathing. GI: Abdomen is soft, nontender, nondistended, no abdominal masses GU: No CVA tenderness.  Skin: No rashes, bruises or suspicious lesions. Lymph: No cervical or inguinal adenopathy. Neurologic: Grossly intact, no focal deficits, moving all 4 extremities. Psychiatric: Normal mood and affect.  Laboratory Data: Lab Results  Component Value Date   WBC 10.3 10/19/2016   HGB 14.6 05/14/2015   HCT 39.9 10/19/2016   MCV 87 10/19/2016   PLT 395 (H) 10/19/2016    Lab Results  Component Value Date   CREATININE 2.09 (H) 10/19/2016    No results found for: PSA  No results found for: TESTOSTERONE  Lab Results  Component Value Date   HGBA1C 6.6 (H) 10/19/2016    Urinalysis    Component Value Date/Time   COLORURINE PINK 11/23/2012 0823   APPEARANCEUR [place] (A) 10/24/2015 0930   LABSPEC 1.010 11/23/2012 0823   PHURINE 7.5 11/23/2012 0823   GLUCOSEU Negative 10/24/2015 0930   GLUCOSEU NEGATIVE 11/23/2012 0823   HGBUR 3+ 11/23/2012 0823   BILIRUBINUR neg 08/18/2016  1625   BILIRUBINUR Negative 10/24/2015 0930   BILIRUBINUR NEGATIVE 11/23/2012 0823   KETONESUR NEGATIVE 11/23/2012 0823   PROTEINUR 2000 ++++ 08/18/2016 1625   PROTEINUR 3+ (A) 10/24/2015 0930   PROTEINUR 100 mg/dL 11/23/2012 0823   UROBILINOGEN 0.2 08/18/2016 1625   NITRITE neg 08/18/2016 1625   NITRITE Negative 10/24/2015 0930   NITRITE NEGATIVE 11/23/2012 0823   LEUKOCYTESUR Negative 08/18/2016 1625   LEUKOCYTESUR Negative 10/24/2015 0930   LEUKOCYTESUR 1+ 11/23/2012 0823      Assessment & Plan:    1. Low risk prostate cancer -PSA stable. continue active surveillance with follow up in 6 months with PSA prior. We'll plan for DRE at that time as patient has requested deferring it today due to diarrhea today. Will need to consider repeat surveillance biopsy in the Summer of 2019.   Return in about 6 months (around 06/25/2017) for PSA prior.  Nickie Retort, MD  [facility] [address] [place], [place] [address] [phone]

## 2016-12-27 ENCOUNTER — Other Ambulatory Visit: Payer: Self-pay | Admitting: Internal Medicine

## 2016-12-27 DIAGNOSIS — I10 Essential (primary) hypertension: Secondary | ICD-10-CM

## 2017-02-15 ENCOUNTER — Encounter: Payer: Self-pay | Admitting: Internal Medicine

## 2017-02-15 ENCOUNTER — Ambulatory Visit (INDEPENDENT_AMBULATORY_CARE_PROVIDER_SITE_OTHER): Payer: 59 | Admitting: Internal Medicine

## 2017-02-15 VITALS — BP 132/78 | HR 86 | Ht 73.0 in | Wt >= 6400 oz

## 2017-02-15 DIAGNOSIS — E1165 Type 2 diabetes mellitus with hyperglycemia: Secondary | ICD-10-CM | POA: Diagnosis not present

## 2017-02-15 DIAGNOSIS — E1129 Type 2 diabetes mellitus with other diabetic kidney complication: Secondary | ICD-10-CM | POA: Diagnosis not present

## 2017-02-15 DIAGNOSIS — IMO0002 Reserved for concepts with insufficient information to code with codable children: Secondary | ICD-10-CM

## 2017-02-15 DIAGNOSIS — I10 Essential (primary) hypertension: Secondary | ICD-10-CM

## 2017-02-15 NOTE — Progress Notes (Signed)
Date:  02/15/2017   Name:  Benjamin Stewart   DOB:  05/06/1976   MRN:  [id number]   Chief Complaint: Diabetes (BS- this morning 181 fasting) and Hypertension Diabetes  He presents for his follow-up diabetic visit. He has type 2 diabetes mellitus. His disease course has been stable. Pertinent negatives for hypoglycemia include no headaches or tremors. Pertinent negatives for diabetes include no chest pain, no fatigue, no foot paresthesias, no foot ulcerations, no polydipsia and no polyuria. Symptoms are stable. Current diabetic treatment includes oral agent (dual therapy) (glimepiride and metformin). He is compliant with treatment all of the time. His weight is stable. He monitors blood glucose at home 1-2 x per week. His breakfast blood glucose is taken between 6-7 am. His breakfast blood glucose range is generally 140-180 mg/dl.  Hypertension  This is a chronic problem. The problem is controlled. Pertinent negatives include no chest pain, headaches, palpitations or shortness of breath. Past treatments include diuretics, angiotensin blockers and calcium channel blockers. The current treatment provides significant improvement.   Lab Results  Component Value Date   CREATININE 2.09 (H) 10/19/2016    Lab Results  Component Value Date   HGBA1C 6.6 (H) 10/19/2016   Wt Readings from Last 3 Encounters:  02/15/17 (!) 411 lb (186.4 kg)  12/23/16 (!) 410 lb 1.6 oz (186 kg)  10/30/16 (!) 409 lb (185.5 kg)     Review of Systems  Constitutional: Negative for appetite change, fatigue and unexpected weight change.  Eyes: Negative for visual disturbance.  Respiratory: Negative for cough, shortness of breath and wheezing.   Cardiovascular: Negative for chest pain, palpitations and leg swelling.  Gastrointestinal: Negative for abdominal pain and blood in stool.  Endocrine: Negative for polydipsia and polyuria.  Genitourinary: Negative for dysuria and hematuria.  Musculoskeletal: Negative for  arthralgias and back pain.  Skin: Negative for color change and rash.  Neurological: Negative for tremors, numbness and headaches.  Psychiatric/Behavioral: Negative for dysphoric mood.    Patient Active Problem List   Diagnosis Date Noted  . Prostate cancer ([place]) 08/23/2015  . DM (diabetes mellitus), type 2, uncontrolled, with renal complications ([place]) 70/08/7492  . Hyperlipidemia associated with type 2 diabetes mellitus ([place]) 07/16/2015  . Acute gouty arthropathy 12/04/2014  . Benign essential hematuria 12/04/2014  . Essential (primary) hypertension 12/04/2014  . Extreme obesity 12/04/2014  . CKD (chronic kidney disease), stage III 12/04/2014    Prior to Admission medications   Medication Sig Start Date End Date Taking? Authorizing Provider  amLODipine (NORVASC) 10 MG tablet TAKE ONE TABLET BY MOUTH ONCE DAILY 10/24/16  Yes Glean Hess, MD  atorvastatin (LIPITOR) 10 MG tablet TAKE ONE TABLET BY MOUTH ONCE DAILY WITH BREAKFAST 10/24/16  Yes Glean Hess, MD  colchicine 0.6 MG tablet One twice a day for gout flare - up to 5 days 11/11/15  Yes Glean Hess, MD  glimepiride (AMARYL) 2 MG tablet TAKE ONE TABLET BY MOUTH ONCE DAILY BEFORE BREAKFAST 11/25/16  Yes Glean Hess, MD  hydrochlorothiazide (HYDRODIURIL) 25 MG tablet TAKE TWO TABLETS BY MOUTH ONCE DAILY 12/28/16  Yes Glean Hess, MD  irbesartan (AVAPRO) 300 MG tablet TAKE ONE TABLET BY MOUTH ONCE DAILY 10/24/16  Yes Glean Hess, MD  metFORMIN (GLUCOPHAGE) 500 MG tablet TAKE ONE TABLET BY MOUTH TWICE DAILY WITH A MEAL 10/27/16  Yes Glean Hess, MD    Allergies  Allergen Reactions  . Percocet [Oxycodone-Acetaminophen] Nausea And Vomiting  Past Surgical History:  Procedure Laterality Date  . PARTIAL COLECTOMY  2005    Social History  Substance Use Topics  . Smoking status: Never Smoker  . Smokeless tobacco: Never Used  . Alcohol use No     Medication list has been reviewed and  updated.   Physical Exam  Constitutional: He is oriented to person, place, and time. He appears well-developed. No distress.  HENT:  Head: Normocephalic and atraumatic.  Neck: Normal range of motion. Neck supple. Carotid bruit is not present. No thyromegaly present.  Cardiovascular: Normal rate, regular rhythm and normal heart sounds.   Pulmonary/Chest: Effort normal and breath sounds normal. No respiratory distress. He has no wheezes. He has no rales.  Musculoskeletal: He exhibits no edema.  Neurological: He is alert and oriented to person, place, and time.  Skin: Skin is warm and dry. No rash noted.  Psychiatric: He has a normal mood and affect. His speech is normal and behavior is normal. Thought content normal.  Nursing note and vitals reviewed.   BP 132/78   Pulse 86   Ht 6\' 1"  (1.854 m)   Wt (!) 411 lb (186.4 kg)   SpO2 97%   BMI 54.22 kg/m   Assessment and Plan: 1. Essential (primary) hypertension controlled  2. 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 Morbid obesity, unspecified obesity type ([place]) Diet and weight loss encouraged   No orders of the defined types were placed in this encounter.   Halina Maidens, MD [facility]  02/15/2017

## 2017-02-15 NOTE — Patient Instructions (Signed)
DASH Eating Plan DASH stands for "Dietary Approaches to Stop Hypertension." The DASH eating plan is a healthy eating plan that has been shown to reduce high blood pressure (hypertension). It may also reduce your risk for type 2 diabetes, heart disease, and stroke. The DASH eating plan may also help with weight loss. What are tips for following this plan? General guidelines  Avoid eating more than 2,300 mg (milligrams) of salt (sodium) a day. If you have hypertension, you may need to reduce your sodium intake to 1,500 mg a day.  Limit alcohol intake to no more than 1 drink a day for nonpregnant women and 2 drinks a day for men. One drink equals 12 oz of beer, 5 oz of wine, or 1 oz of hard liquor.  Work with your health care provider to maintain a healthy body weight or to lose weight. Ask what an ideal weight is for you.  Get at least 30 minutes of exercise that causes your heart to beat faster (aerobic exercise) most days of the week. Activities may include walking, swimming, or biking.  Work with your health care provider or diet and nutrition specialist (dietitian) to adjust your eating plan to your individual calorie needs. Reading food labels  Check food labels for the amount of sodium per serving. Choose foods with less than 5 percent of the Daily Value of sodium. Generally, foods with less than 300 mg of sodium per serving fit into this eating plan.  To find whole grains, look for the word "whole" as the first word in the ingredient list. Shopping  Buy products labeled as "low-sodium" or "no salt added."  Buy fresh foods. Avoid canned foods and premade or frozen meals. Cooking  Avoid adding salt when cooking. Use salt-free seasonings or herbs instead of table salt or sea salt. Check with your health care provider or pharmacist before using salt substitutes.  Do not fry foods. Cook foods using healthy methods such as baking, boiling, grilling, and broiling instead.  Cook with  heart-healthy oils, such as olive, canola, soybean, or sunflower oil. Meal planning   Eat a balanced diet that includes: ? 5 or more servings of fruits and vegetables each day. At each meal, try to fill half of your plate with fruits and vegetables. ? Up to 6-8 servings of whole grains each day. ? Less than 6 oz of lean meat, poultry, or fish each day. A 3-oz serving of meat is about the same size as a deck of cards. One egg equals 1 oz. ? 2 servings of low-fat dairy each day. ? A serving of nuts, seeds, or beans 5 times each week. ? Heart-healthy fats. Healthy fats called Omega-3 fatty acids are found in foods such as flaxseeds and coldwater fish, like sardines, salmon, and mackerel.  Limit how much you eat of the following: ? Canned or prepackaged foods. ? Food that is high in trans fat, such as fried foods. ? Food that is high in saturated fat, such as fatty meat. ? Sweets, desserts, sugary drinks, and other foods with added sugar. ? Full-fat dairy products.  Do not salt foods before eating.  Try to eat at least 2 vegetarian meals each week.  Eat more home-cooked food and less restaurant, buffet, and fast food.  When eating at a restaurant, ask that your food be prepared with less salt or no salt, if possible. What foods are recommended? The items listed may not be a complete list. Talk with your dietitian about what   dietary choices are best for you. Grains Whole-grain or whole-wheat bread. Whole-grain or whole-wheat pasta. Brown rice. Oatmeal. Quinoa. Bulgur. Whole-grain and low-sodium cereals. Pita bread. Low-fat, low-sodium crackers. Whole-wheat flour tortillas. Vegetables Fresh or frozen vegetables (raw, steamed, roasted, or grilled). Low-sodium or reduced-sodium tomato and vegetable juice. Low-sodium or reduced-sodium tomato sauce and tomato paste. Low-sodium or reduced-sodium canned vegetables. Fruits All fresh, dried, or frozen fruit. Canned fruit in natural juice (without  added sugar). Meat and other protein foods Skinless chicken or turkey. Ground chicken or turkey. Pork with fat trimmed off. Fish and seafood. Egg whites. Dried beans, peas, or lentils. Unsalted nuts, nut butters, and seeds. Unsalted canned beans. Lean cuts of beef with fat trimmed off. Low-sodium, lean deli meat. Dairy Low-fat (1%) or fat-free (skim) milk. Fat-free, low-fat, or reduced-fat cheeses. Nonfat, low-sodium ricotta or cottage cheese. Low-fat or nonfat yogurt. Low-fat, low-sodium cheese. Fats and oils Soft margarine without trans fats. Vegetable oil. Low-fat, reduced-fat, or light mayonnaise and salad dressings (reduced-sodium). Canola, safflower, olive, soybean, and sunflower oils. Avocado. Seasoning and other foods Herbs. Spices. Seasoning mixes without salt. Unsalted popcorn and pretzels. Fat-free sweets. What foods are not recommended? The items listed may not be a complete list. Talk with your dietitian about what dietary choices are best for you. Grains Baked goods made with fat, such as croissants, muffins, or some breads. Dry pasta or rice meal packs. Vegetables Creamed or fried vegetables. Vegetables in a cheese sauce. Regular canned vegetables (not low-sodium or reduced-sodium). Regular canned tomato sauce and paste (not low-sodium or reduced-sodium). Regular tomato and vegetable juice (not low-sodium or reduced-sodium). Pickles. Olives. Fruits Canned fruit in a light or heavy syrup. Fried fruit. Fruit in cream or butter sauce. Meat and other protein foods Fatty cuts of meat. Ribs. Fried meat. Bacon. Sausage. Bologna and other processed lunch meats. Salami. Fatback. Hotdogs. Bratwurst. Salted nuts and seeds. Canned beans with added salt. Canned or smoked fish. Whole eggs or egg yolks. Chicken or turkey with skin. Dairy Whole or 2% milk, cream, and half-and-half. Whole or full-fat cream cheese. Whole-fat or sweetened yogurt. Full-fat cheese. Nondairy creamers. Whipped toppings.  Processed cheese and cheese spreads. Fats and oils Butter. Stick margarine. Lard. Shortening. Ghee. Bacon fat. Tropical oils, such as coconut, palm kernel, or palm oil. Seasoning and other foods Salted popcorn and pretzels. Onion salt, garlic salt, seasoned salt, table salt, and sea salt. Worcestershire sauce. Tartar sauce. Barbecue sauce. Teriyaki sauce. Soy sauce, including reduced-sodium. Steak sauce. Canned and packaged gravies. Fish sauce. Oyster sauce. Cocktail sauce. Horseradish that you find on the shelf. Ketchup. Mustard. Meat flavorings and tenderizers. Bouillon cubes. Hot sauce and Tabasco sauce. Premade or packaged marinades. Premade or packaged taco seasonings. Relishes. Regular salad dressings. Where to find more information:  National Heart, Lung, and Blood Institute: www.nhlbi.nih.gov  American Heart Association: www.heart.org Summary  The DASH eating plan is a healthy eating plan that has been shown to reduce high blood pressure (hypertension). It may also reduce your risk for type 2 diabetes, heart disease, and stroke.  With the DASH eating plan, you should limit salt (sodium) intake to 2,300 mg a day. If you have hypertension, you may need to reduce your sodium intake to 1,500 mg a day.  When on the DASH eating plan, aim to eat more fresh fruits and vegetables, whole grains, lean proteins, low-fat dairy, and heart-healthy fats.  Work with your health care provider or diet and nutrition specialist (dietitian) to adjust your eating plan to your individual   calorie needs. This information is not intended to replace advice given to you by your health care provider. Make sure you discuss any questions you have with your health care provider. Document Released: 07/16/2011 Document Revised: 07/20/2016 Document Reviewed: 07/20/2016 Elsevier Interactive Patient Education  2017 Elsevier Inc.  

## 2017-02-16 LAB — BASIC METABOLIC PANEL
BUN / CREAT RATIO: 17 (ref 9–20)
BUN: 44 mg/dL — AB (ref 6–24)
CALCIUM: 10.5 mg/dL — AB (ref 8.7–10.2)
CHLORIDE: 103 mmol/L (ref 96–106)
CO2: 23 mmol/L (ref 20–29)
CREATININE: 2.61 mg/dL — AB (ref 0.76–1.27)
GFR calc non Af Amer: 29 mL/min/{1.73_m2} — ABNORMAL LOW (ref >59)
GFR, EST AFRICAN AMERICAN: 33 mL/min/{1.73_m2} — AB (ref >59)
Glucose: 80 mg/dL (ref 65–99)
Potassium: 4.6 mmol/L (ref 3.5–5.2)
Sodium: 144 mmol/L (ref 134–144)

## 2017-02-16 LAB — HEMOGLOBIN A1C
Est. average glucose Bld gHb Est-mCnc: 140 mg/dL
Hgb A1c MFr Bld: 6.5 % — ABNORMAL HIGH (ref 4.8–5.6)

## 2017-02-16 NOTE — Progress Notes (Signed)
Pt agreed to see specialist- please advise.

## 2017-02-17 ENCOUNTER — Ambulatory Visit: Payer: 59 | Admitting: Internal Medicine

## 2017-02-17 ENCOUNTER — Other Ambulatory Visit: Payer: Self-pay | Admitting: Internal Medicine

## 2017-02-17 DIAGNOSIS — N183 Chronic kidney disease, stage 3 unspecified: Secondary | ICD-10-CM

## 2017-02-28 ENCOUNTER — Other Ambulatory Visit: Payer: Self-pay | Admitting: Internal Medicine

## 2017-03-01 DIAGNOSIS — N183 Chronic kidney disease, stage 3 (moderate): Secondary | ICD-10-CM | POA: Diagnosis not present

## 2017-03-01 DIAGNOSIS — E1122 Type 2 diabetes mellitus with diabetic chronic kidney disease: Secondary | ICD-10-CM | POA: Diagnosis not present

## 2017-03-01 DIAGNOSIS — I10 Essential (primary) hypertension: Secondary | ICD-10-CM | POA: Diagnosis not present

## 2017-03-01 DIAGNOSIS — R809 Proteinuria, unspecified: Secondary | ICD-10-CM | POA: Diagnosis not present

## 2017-03-16 DIAGNOSIS — N183 Chronic kidney disease, stage 3 (moderate): Secondary | ICD-10-CM | POA: Diagnosis not present

## 2017-03-29 DIAGNOSIS — I10 Essential (primary) hypertension: Secondary | ICD-10-CM | POA: Diagnosis not present

## 2017-03-29 DIAGNOSIS — E1122 Type 2 diabetes mellitus with diabetic chronic kidney disease: Secondary | ICD-10-CM | POA: Diagnosis not present

## 2017-03-29 DIAGNOSIS — N2581 Secondary hyperparathyroidism of renal origin: Secondary | ICD-10-CM | POA: Diagnosis not present

## 2017-03-29 DIAGNOSIS — N183 Chronic kidney disease, stage 3 (moderate): Secondary | ICD-10-CM | POA: Diagnosis not present

## 2017-04-01 ENCOUNTER — Encounter: Payer: Self-pay | Admitting: Internal Medicine

## 2017-04-01 ENCOUNTER — Other Ambulatory Visit: Payer: Self-pay | Admitting: Internal Medicine

## 2017-04-01 ENCOUNTER — Ambulatory Visit (INDEPENDENT_AMBULATORY_CARE_PROVIDER_SITE_OTHER): Payer: 59 | Admitting: Internal Medicine

## 2017-04-01 VITALS — BP 126/86 | HR 88 | Ht 73.0 in | Wt 383.0 lb

## 2017-04-01 DIAGNOSIS — M109 Gout, unspecified: Secondary | ICD-10-CM | POA: Diagnosis not present

## 2017-04-01 DIAGNOSIS — I10 Essential (primary) hypertension: Secondary | ICD-10-CM | POA: Diagnosis not present

## 2017-04-01 DIAGNOSIS — N183 Chronic kidney disease, stage 3 unspecified: Secondary | ICD-10-CM

## 2017-04-01 MED ORDER — COLCHICINE 0.6 MG PO TABS: ORAL_TABLET | ORAL | 2 refills | Status: DC

## 2017-04-01 MED ORDER — PREDNISONE 10 MG PO TABS: ORAL_TABLET | ORAL | 0 refills | Status: DC

## 2017-04-01 NOTE — Progress Notes (Signed)
Date:  04/01/2017   Name:  Benjamin Stewart   DOB:  1976-06-03   MRN:  [id number]   Chief Complaint: Gout (Has colchicine at home but it has expired- tried taking and did not help. L) big toe is inflammed. X 2 days ago @ night could not sleep. Throbbing pain. )  Toe Pain   There was no injury mechanism. The pain is present in the right toes. The quality of the pain is described as burning and stabbing. The pain is moderate. Associated symptoms include an inability to bear weight. Pertinent negatives include no loss of motion. He has tried NSAIDs for the symptoms. The treatment provided no relief.    Review of Systems  Constitutional: Negative for chills, fatigue and fever.  Respiratory: Negative for shortness of breath.   Cardiovascular: Negative for chest pain.  Musculoskeletal: Positive for arthralgias and gait problem.    Patient Active Problem List   Diagnosis Date Noted  . Prostate cancer ([place]) 08/23/2015  . DM (diabetes mellitus), type 2, uncontrolled, with renal complications ([place]) 26/37/8588  . Hyperlipidemia associated with type 2 diabetes mellitus ([place]) 07/16/2015  . Acute gouty arthropathy 12/04/2014  . Benign essential hematuria 12/04/2014  . Essential (primary) hypertension 12/04/2014  . Morbid obesity, unspecified obesity type ([place]) 12/04/2014  . CKD (chronic kidney disease), stage III 12/04/2014    Prior to Admission medications   Medication Sig Start Date End Date Taking? Authorizing Provider  amLODipine (NORVASC) 10 MG tablet TAKE ONE TABLET BY MOUTH ONCE DAILY 10/24/16  Yes Glean Hess, MD  atorvastatin (LIPITOR) 10 MG tablet TAKE ONE TABLET BY MOUTH ONCE DAILY WITH BREAKFAST 10/24/16  Yes Glean Hess, MD  carvedilol (COREG) 6.25 MG tablet Take 6.25 mg by mouth daily. 03/30/17  Yes [provider]  glimepiride (AMARYL) 2 MG tablet TAKE 1 TABLET BY MOUTH ONCE DAILY BEFORE  BREAKFAST 03/01/17  Yes Glean Hess, MD  hydrALAZINE (APRESOLINE)  25 MG tablet Take 25 mg by mouth daily. 03/29/17  Yes [provider]  hydrochlorothiazide (HYDRODIURIL) 25 MG tablet TAKE TWO TABLETS BY MOUTH ONCE DAILY 12/28/16  Yes Glean Hess, MD  irbesartan (AVAPRO) 300 MG tablet TAKE ONE TABLET BY MOUTH ONCE DAILY 10/24/16  Yes Glean Hess, MD  metFORMIN (GLUCOPHAGE) 500 MG tablet TAKE ONE TABLET BY MOUTH TWICE DAILY WITH A MEAL 10/27/16  Yes Glean Hess, MD  colchicine 0.6 MG tablet One twice a day for gout flare - up to 5 days Patient not taking: Reported on 04/01/2017 11/11/15   Glean Hess, MD    Allergies  Allergen Reactions  . Percocet [Oxycodone-Acetaminophen] Nausea And Vomiting    Past Surgical History:  Procedure Laterality Date  . PARTIAL COLECTOMY  2005    Social History  Substance Use Topics  . Smoking status: Never Smoker  . Smokeless tobacco: Never Used  . Alcohol use No     Medication list has been reviewed and updated.   Physical Exam  Constitutional: He is oriented to person, place, and time. He appears well-developed. No distress.  HENT:  Head: Normocephalic and atraumatic.  Cardiovascular: Normal rate, regular rhythm and normal heart sounds.   Pulmonary/Chest: Effort normal and breath sounds normal. No respiratory distress. He has no wheezes.  Musculoskeletal: Normal range of motion.       Feet:  Neurological: He is alert and oriented to person, place, and time.  Skin: Skin is warm and dry. No rash noted.  Psychiatric: He has a normal mood and affect. His behavior is normal. Thought content normal.  Nursing note and vitals reviewed.   BP 126/86   Pulse 88   Ht 6\' 1"  (1.854 m)   Wt (!) 383 lb (173.7 kg)   SpO2 98%   BMI 50.53 kg/m   Assessment and Plan: 1. Acute gouty arthropathy Resume colchicine bid x  Max 5 days or until pain resolved - colchicine 0.6 MG tablet; One twice a day for gout flare - up to 5 days  Dispense: 30 tablet; Refill: 2  2. 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 Essential (primary) hypertension controlled   Meds ordered this encounter  Medications  . colchicine 0.6 MG tablet    Sig: One twice a day for gout flare - up to 5 days    Dispense:  30 tablet    Refill:  [place], MD [facility]  04/01/2017

## 2017-04-01 NOTE — Telephone Encounter (Signed)
Patient Message- my Chart. Please Advise.

## 2017-04-01 NOTE — Patient Instructions (Signed)
Vitamin D 2000 IU daily 

## 2017-04-15 ENCOUNTER — Ambulatory Visit
Admission: RE | Admit: 2017-04-15 | Discharge: 2017-04-15 | Disposition: A | Discharge status: Home / Self Care | Payer: 59 | Source: Ambulatory Visit | Attending: Internal Medicine | Admitting: Internal Medicine

## 2017-04-15 ENCOUNTER — Encounter: Payer: Self-pay | Admitting: Internal Medicine

## 2017-04-15 ENCOUNTER — Ambulatory Visit (INDEPENDENT_AMBULATORY_CARE_PROVIDER_SITE_OTHER): Payer: 59 | Admitting: Internal Medicine

## 2017-04-15 ENCOUNTER — Other Ambulatory Visit: Payer: Self-pay | Admitting: Internal Medicine

## 2017-04-15 VITALS — BP 122/72 | HR 82 | Ht 73.0 in | Wt 384.0 lb

## 2017-04-15 DIAGNOSIS — M79672 Pain in left foot: Secondary | ICD-10-CM

## 2017-04-15 MED ORDER — TRAMADOL HCL 50 MG PO TABS: 50.0000 mg | ORAL_TABLET | Freq: Three times a day (TID) | ORAL | 0 refills | Status: DC | PRN

## 2017-04-15 NOTE — Progress Notes (Signed)
Date:  04/15/2017   Name:  Benjamin Stewart   DOB:  08/27/1975   MRN:  [id number]   Chief Complaint: Foot Pain (Started lastnight- hurt in ankle. Left Foot Pain. Having to walk on a crutch can't bear weight. Foot is swollen and painful. Thinks this is not gout- works on concrete floors and wears steel toe boots for work. ) Patient had episode of gout in his left great toe 2 weeks ago. He took some colchicine and that resolved. Several days ago he had some pain in his ankle and then yesterday began to have pain in the mid foot. He worked part of the day went home and elevated it today it's worse he has to wear a crutch so he can only be partial weightbearing. The mid foot is red and warm and swollen minimally tender to touch. He does it does not feel like gout.   Review of Systems  Constitutional: Negative for chills, fatigue and fever.  Respiratory: Negative for chest tightness and shortness of breath.   Cardiovascular: Negative for chest pain and palpitations.  Musculoskeletal: Positive for arthralgias, gait problem and joint swelling.    Patient Active Problem List   Diagnosis Date Noted  . Prostate cancer ([place]) 08/23/2015  . DM (diabetes mellitus), type 2, uncontrolled, with renal complications ([place]) 37/34/2876  . Hyperlipidemia associated with type 2 diabetes mellitus ([place]) 07/16/2015  . Acute gouty arthropathy 12/04/2014  . Benign essential hematuria 12/04/2014  . Essential (primary) hypertension 12/04/2014  . Morbid obesity, unspecified obesity type ([place]) 12/04/2014  . CKD (chronic kidney disease), stage III 12/04/2014    Prior to Admission medications   Medication Sig Start Date End Date Taking? Authorizing Provider  amLODipine (NORVASC) 10 MG tablet TAKE ONE TABLET BY MOUTH ONCE DAILY 10/24/16  Yes Glean Hess, MD  atorvastatin (LIPITOR) 10 MG tablet TAKE ONE TABLET BY MOUTH ONCE DAILY WITH BREAKFAST 10/24/16  Yes Glean Hess, MD  carvedilol (COREG) 6.25 MG tablet  Take 6.25 mg by mouth daily. 03/30/17  Yes [provider]  glimepiride (AMARYL) 2 MG tablet TAKE 1 TABLET BY MOUTH ONCE DAILY BEFORE  BREAKFAST 03/01/17  Yes Glean Hess, MD  hydrALAZINE (APRESOLINE) 25 MG tablet Take 25 mg by mouth daily. 03/29/17  Yes [provider]  hydrochlorothiazide (HYDRODIURIL) 25 MG tablet TAKE TWO TABLETS BY MOUTH ONCE DAILY 12/28/16  Yes Glean Hess, MD  irbesartan (AVAPRO) 300 MG tablet TAKE ONE TABLET BY MOUTH ONCE DAILY 10/24/16  Yes Glean Hess, MD  metFORMIN (GLUCOPHAGE) 500 MG tablet TAKE ONE TABLET BY MOUTH TWICE DAILY WITH A MEAL 10/27/16  Yes Glean Hess, MD  colchicine 0.6 MG tablet One twice a day for gout flare - up to 5 days Patient not taking: Reported on 04/15/2017 04/01/17   Glean Hess, MD    Allergies  Allergen Reactions  . Percocet [Oxycodone-Acetaminophen] Nausea And Vomiting    Past Surgical History:  Procedure Laterality Date  . PARTIAL COLECTOMY  2005    Social History  Substance Use Topics  . Smoking status: Never Smoker  . Smokeless tobacco: Never Used  . Alcohol use No     Medication list has been reviewed and updated.  PHQ 2/9 Scores 08/18/2016  PHQ - 2 Score 0    Physical Exam  Constitutional: He is oriented to person, place, and time. He appears well-developed. No distress.  HENT:  Head: Normocephalic and atraumatic.  Pulmonary/Chest: Effort normal. No respiratory  distress.  Musculoskeletal: Normal range of motion.       Feet:  Neurological: He is alert and oriented to person, place, and time.  Skin: Skin is warm and dry. No rash noted.  Psychiatric: He has a normal mood and affect. His behavior is normal. Thought content normal.  Nursing note and vitals reviewed.   BP 122/72   Pulse 82   Ht 6\' 1"  (1.854 m)   Wt (!) 384 lb (174.2 kg)   SpO2 100%   BMI 50.66 kg/m   Assessment and Plan: 1. Left foot pain If fractured - will refer to Podiatry If no fracture will  prescribe tramadol Note to be out of work until 04/19/17 - DG Foot Complete Left; Future   No orders of the defined types were placed in this encounter.   Partially dictated using Editor, commissioning. Any errors are unintentional.  Halina Maidens, MD [facility]  04/15/2017

## 2017-04-16 ENCOUNTER — Other Ambulatory Visit: Payer: Self-pay | Admitting: Internal Medicine

## 2017-04-16 ENCOUNTER — Other Ambulatory Visit: Payer: Self-pay

## 2017-04-16 DIAGNOSIS — N183 Chronic kidney disease, stage 3 (moderate): Secondary | ICD-10-CM | POA: Diagnosis not present

## 2017-04-16 MED ORDER — TRAMADOL HCL 50 MG PO TABS: 50.0000 mg | ORAL_TABLET | Freq: Three times a day (TID) | ORAL | 0 refills | Status: DC | PRN

## 2017-04-26 ENCOUNTER — Encounter: Payer: Self-pay | Admitting: Internal Medicine

## 2017-04-26 ENCOUNTER — Other Ambulatory Visit: Payer: Self-pay | Admitting: Internal Medicine

## 2017-04-26 ENCOUNTER — Telehealth: Payer: Self-pay

## 2017-04-26 DIAGNOSIS — M79672 Pain in left foot: Secondary | ICD-10-CM

## 2017-04-26 NOTE — Telephone Encounter (Signed)
Patient sent MyChart message requesting referral to podiatrist. Please Advise.

## 2017-04-26 NOTE — Telephone Encounter (Signed)
Note written for pt to pick up or for you to fax.

## 2017-04-26 NOTE — Telephone Encounter (Signed)
Pt informed via mychart

## 2017-04-26 NOTE — Telephone Encounter (Signed)
Patient also requesting to see if he can get a note for work until seeing the podiatrist. Patient states he is having to walk on crutch- cannot bear weight. Please Advise.

## 2017-04-27 ENCOUNTER — Encounter: Payer: Self-pay | Admitting: Internal Medicine

## 2017-04-27 DIAGNOSIS — N183 Chronic kidney disease, stage 3 (moderate): Secondary | ICD-10-CM | POA: Diagnosis not present

## 2017-04-28 ENCOUNTER — Encounter: Payer: Self-pay | Admitting: Internal Medicine

## 2017-04-30 ENCOUNTER — Other Ambulatory Visit: Payer: Self-pay | Admitting: Internal Medicine

## 2017-04-30 ENCOUNTER — Ambulatory Visit: Payer: 59 | Attending: Neurology

## 2017-04-30 DIAGNOSIS — I10 Essential (primary) hypertension: Secondary | ICD-10-CM

## 2017-04-30 DIAGNOSIS — G4733 Obstructive sleep apnea (adult) (pediatric): Secondary | ICD-10-CM | POA: Diagnosis not present

## 2017-05-03 ENCOUNTER — Encounter: Payer: Self-pay | Admitting: Internal Medicine

## 2017-05-03 ENCOUNTER — Telehealth: Payer: Self-pay

## 2017-05-03 DIAGNOSIS — M79671 Pain in right foot: Secondary | ICD-10-CM | POA: Diagnosis not present

## 2017-05-03 DIAGNOSIS — M109 Gout, unspecified: Secondary | ICD-10-CM | POA: Diagnosis not present

## 2017-05-03 NOTE — Telephone Encounter (Signed)
Benjamin Stewart from [place] called about patient Disability Claim from work. Asked when is patients start to end date for disability, what is patients Dx, and When was first day of treatment. Answered: Start to end dates are 04/23/2017- 05/09/2017. Patient can return to work 05/10/2017.  Dx: Probable gout of L foot.  1st day of treatment: 04/01/2017.  He verbalized understanding.

## 2017-05-03 NOTE — Telephone Encounter (Signed)
Patient MyChart msg visit about foot with Dr Vickki Muff.

## 2017-05-06 ENCOUNTER — Encounter: Payer: Self-pay | Admitting: Internal Medicine

## 2017-05-07 ENCOUNTER — Encounter: Payer: Self-pay | Admitting: Internal Medicine

## 2017-05-10 ENCOUNTER — Other Ambulatory Visit: Payer: Self-pay | Admitting: Internal Medicine

## 2017-05-10 ENCOUNTER — Encounter: Payer: Self-pay | Admitting: Internal Medicine

## 2017-05-10 DIAGNOSIS — M79671 Pain in right foot: Secondary | ICD-10-CM | POA: Diagnosis not present

## 2017-05-10 MED ORDER — PREDNISONE 10 MG PO TABS: 40.0000 mg | ORAL_TABLET | Freq: Every day | ORAL | 0 refills | Status: AC

## 2017-05-25 ENCOUNTER — Encounter: Payer: Self-pay | Admitting: Internal Medicine

## 2017-06-10 ENCOUNTER — Encounter: Payer: Self-pay | Admitting: Internal Medicine

## 2017-06-21 ENCOUNTER — Other Ambulatory Visit: Payer: 59

## 2017-06-21 DIAGNOSIS — N2581 Secondary hyperparathyroidism of renal origin: Secondary | ICD-10-CM | POA: Diagnosis not present

## 2017-06-21 DIAGNOSIS — C61 Malignant neoplasm of prostate: Secondary | ICD-10-CM | POA: Diagnosis not present

## 2017-06-21 DIAGNOSIS — E1122 Type 2 diabetes mellitus with diabetic chronic kidney disease: Secondary | ICD-10-CM | POA: Diagnosis not present

## 2017-06-21 DIAGNOSIS — I10 Essential (primary) hypertension: Secondary | ICD-10-CM | POA: Diagnosis not present

## 2017-06-21 DIAGNOSIS — N184 Chronic kidney disease, stage 4 (severe): Secondary | ICD-10-CM | POA: Diagnosis not present

## 2017-06-22 LAB — PSA: Prostate Specific Ag, Serum: 8.4 ng/mL — ABNORMAL HIGH (ref 0.0–4.0)

## 2017-06-25 ENCOUNTER — Ambulatory Visit (INDEPENDENT_AMBULATORY_CARE_PROVIDER_SITE_OTHER): Payer: 59 | Admitting: Urology

## 2017-06-25 ENCOUNTER — Encounter: Payer: Self-pay | Admitting: Urology

## 2017-06-25 ENCOUNTER — Other Ambulatory Visit: Payer: Self-pay | Admitting: Urology

## 2017-06-25 VITALS — BP 136/88 | HR 78 | Ht 73.0 in | Wt 373.7 lb

## 2017-06-25 DIAGNOSIS — C61 Malignant neoplasm of prostate: Secondary | ICD-10-CM

## 2017-06-25 NOTE — Progress Notes (Signed)
06/25/2017 2:44 PM   Benjamin Stewart 01-Apr-1976 [id number]  Referring provider: Glean Hess, MD [address] [place], [place] [address]  Chief Complaint  Patient presents with  . Prostate Cancer    HPI: The patient is a 41 year old gentleman who presents today to discuss PSA results.  1. Prostate cancer Biopsy #1: (4/17) Gleason 3+3 = 6 prostate cancer.  7/12 cores positive. 5 of the cores ranging from 1-9%. He had 2 cores of approximately 37%.  Biopsy#2: (7/17) Gleason 3+3 = 6 prostate cancer. 5/12 cores positive 1 core was approximately 33%. The remaining 4 ranged from 3-8%  10/12 biopsy regions with at least one positive specimen on one of the above biopies (LMB and RLB only regions without positive core on both biopsies)  PSA was 5.8 in May 2018. PSA is 5.6 in October 2017. It was 6.5 in March 2017 at the time of his prostate biopsy.  Prior to that it was 7.0 in December 2016.  PSA has now risen to 8.4 in November 2018.  2. Microscopic Hematuria The patient has a history of microscopic hematuria. He saw urologist in 2013 and had a cystoscopy and CT at that time which was negative.  3. History of nephrolithiasis The patient passed a kidney stone in 2001. This was his only stone. He is having no symptoms of a kidney stone at this time.         PMH: Past Medical History:  Diagnosis Date  . Benign essential hematuria 12/04/2014   microscopic hematuria evaluation with CT and cysto - treated for 2 months with bactrim DS; no further f/u planned unless hematuria recurs.   . Controlled type 2 diabetes mellitus without complication, without long-term current use of insulin ([place]) 07/16/2015  . Essential (primary) hypertension 12/04/2014  . prostate cancer     Surgical History: Past Surgical History:  Procedure Laterality Date  . PARTIAL COLECTOMY  2005    Home Medications:  Allergies as of 06/25/2017      Reactions   Percocet  [oxycodone-acetaminophen] Nausea And Vomiting      Medication List        Accurate as of 06/25/17  2:44 PM. Always use your most recent med list.          amLODipine 10 MG tablet Commonly known as:  NORVASC TAKE 1 TABLET BY MOUTH ONCE DAILY   atorvastatin 10 MG tablet Commonly known as:  LIPITOR TAKE 1 TABLET BY MOUTH ONCE DAILY WITH  BREAKFAST   carvedilol 6.25 MG tablet Commonly known as:  COREG Take 6.25 mg by mouth daily.   colchicine 0.6 MG tablet One twice a day for gout flare - up to 5 days   glimepiride 2 MG tablet Commonly known as:  AMARYL TAKE 1 TABLET BY MOUTH ONCE DAILY BEFORE  BREAKFAST   hydrALAZINE 25 MG tablet Commonly known as:  APRESOLINE Take 25 mg by mouth daily.   hydrochlorothiazide 25 MG tablet Commonly known as:  HYDRODIURIL TAKE TWO TABLETS BY MOUTH ONCE DAILY   irbesartan 300 MG tablet Commonly known as:  AVAPRO TAKE 1 TABLET BY MOUTH ONCE DAILY   metFORMIN 500 MG tablet Commonly known as:  GLUCOPHAGE TAKE 1 TABLET BY MOUTH TWICE DAILY WITH MEALS   traMADol 50 MG tablet Commonly known as:  ULTRAM Take 1 tablet (50 mg total) by mouth every 8 (eight) hours as needed.       Allergies:  Allergies  Allergen Reactions  . Percocet [Oxycodone-Acetaminophen]  Nausea And Vomiting    Family History: Family History  Problem Relation Age of Onset  . Diabetes Mother   . Heart disease Mother   . Hypertension Mother   . Diabetes Maternal Grandmother   . Diabetes Maternal Grandfather   . Prostate cancer Maternal Uncle     Social History:  reports that  has never smoked. he has never used smokeless tobacco. He reports that he does not drink alcohol or use drugs.  ROS: UROLOGY Frequent Urination?: Yes Hard to postpone urination?: No Burning/pain with urination?: No Get up at night to urinate?: Yes Leakage of urine?: No Urine stream starts and stops?: No Trouble starting stream?: No Do you have to strain to urinate?: No Blood in  urine?: No Urinary tract infection?: No Sexually transmitted disease?: No Injury to kidneys or bladder?: No Painful intercourse?: No Weak stream?: No Erection problems?: No Penile pain?: No  Gastrointestinal Nausea?: No Vomiting?: No Indigestion/heartburn?: No Diarrhea?: No Constipation?: No  Constitutional Fever: No Night sweats?: No Weight loss?: No Fatigue?: No  Skin Skin rash/lesions?: No Itching?: No  Eyes Blurred vision?: No Double vision?: No  Ears/Nose/Throat Sore throat?: No Sinus problems?: No  Hematologic/Lymphatic Swollen glands?: No Easy bruising?: No  Cardiovascular Leg swelling?: No Chest pain?: No  Respiratory Cough?: No Shortness of breath?: No  Endocrine Excessive thirst?: No  Musculoskeletal Back pain?: No Joint pain?: No  Neurological Headaches?: No Dizziness?: No  Psychologic Depression?: No Anxiety?: No  Physical Exam: BP 136/88 (BP Location: Right Arm, Patient Position: Sitting, Cuff Size: Large)   Pulse 78   Ht 6\' 1"  (1.854 m)   Wt (!) 373 lb 11.2 oz (169.5 kg)   BMI 49.30 kg/m   Constitutional:  Alert and oriented, No acute distress. HEENT: [place] AT, moist mucus membranes.  Trachea midline, no masses. Cardiovascular: No clubbing, cyanosis, or edema. Respiratory: Normal respiratory effort, no increased work of breathing. GI: Abdomen is soft, nontender, nondistended, no abdominal masses GU: No CVA tenderness.  Skin: No rashes, bruises or suspicious lesions. Lymph: No cervical or inguinal adenopathy. Neurologic: Grossly intact, no focal deficits, moving all 4 extremities. Psychiatric: Normal mood and affect.  Laboratory Data: Lab Results  Component Value Date   WBC 10.3 10/19/2016   HGB 13.1 10/19/2016   HCT 39.9 10/19/2016   MCV 87 10/19/2016   PLT 395 (H) 10/19/2016    Lab Results  Component Value Date   CREATININE 2.61 (H) 02/15/2017    No results found for: PSA  No results found for:  TESTOSTERONE  Lab Results  Component Value Date   HGBA1C 6.5 (H) 02/15/2017    Urinalysis    Component Value Date/Time   COLORURINE PINK 11/23/2012 0823   APPEARANCEUR Hazy (A) 10/24/2015 0930   LABSPEC 1.010 11/23/2012 0823   PHURINE 7.5 11/23/2012 0823   GLUCOSEU Negative 10/24/2015 0930   GLUCOSEU NEGATIVE 11/23/2012 0823   HGBUR 3+ 11/23/2012 0823   BILIRUBINUR neg 08/18/2016 1625   BILIRUBINUR Negative 10/24/2015 0930   BILIRUBINUR NEGATIVE 11/23/2012 0823   KETONESUR NEGATIVE 11/23/2012 0823   PROTEINUR 2000 ++++ 08/18/2016 1625   PROTEINUR 3+ (A) 10/24/2015 0930   PROTEINUR 100 mg/dL 11/23/2012 0823   UROBILINOGEN 0.2 08/18/2016 1625   NITRITE neg 08/18/2016 1625   NITRITE Negative 10/24/2015 0930   NITRITE NEGATIVE 11/23/2012 0823   LEUKOCYTESUR Negative 08/18/2016 1625   LEUKOCYTESUR Negative 10/24/2015 0930   LEUKOCYTESUR 1+ 11/23/2012 0823     Assessment & Plan:    1. Low  risk prostate cancer I discussed with the patient that his PSA is now beginning to rise in the setting of his low risk prostate cancer.  I do think at this time it would be a good idea for him to undergo an MRI of his prostate.  If this is positive, we can have him undergo a prostate fusion biopsy.  If it is negative, this will be reassuring.  We will have him undergo this procedure and follow-up with [place] to review imaging.  Return for after prostate MRI.  Nickie Retort, MD  [facility] [address], [place], [place] [address] [phone]

## 2017-07-16 ENCOUNTER — Ambulatory Visit: Payer: 59 | Admitting: Urology

## 2017-07-19 ENCOUNTER — Inpatient Hospital Stay
Admission: RE | Admit: 2017-07-19 | Discharge: 2017-07-19 | Disposition: A | Discharge status: Home / Self Care | Payer: 59 | Source: Ambulatory Visit | Attending: Urology | Admitting: Urology

## 2017-08-04 ENCOUNTER — Ambulatory Visit
Admission: RE | Admit: 2017-08-04 | Discharge: 2017-08-04 | Disposition: A | Discharge status: Home / Self Care | Payer: 59 | Source: Ambulatory Visit | Attending: Urology | Admitting: Urology

## 2017-08-04 DIAGNOSIS — C61 Malignant neoplasm of prostate: Secondary | ICD-10-CM

## 2017-08-04 MED ORDER — GADOBENATE DIMEGLUMINE 529 MG/ML IV SOLN
8.0000 mL | Freq: Once | INTRAVENOUS | Status: AC | PRN
  Administered 2017-08-04: 8 mL via INTRAVENOUS

## 2017-08-05 ENCOUNTER — Encounter: Payer: Self-pay | Admitting: Urology

## 2017-08-05 ENCOUNTER — Ambulatory Visit (INDEPENDENT_AMBULATORY_CARE_PROVIDER_SITE_OTHER): Payer: 59 | Admitting: Urology

## 2017-08-05 VITALS — BP 156/101 | HR 99 | Ht 73.0 in | Wt 373.6 lb

## 2017-08-05 DIAGNOSIS — C61 Malignant neoplasm of prostate: Secondary | ICD-10-CM

## 2017-08-05 NOTE — Progress Notes (Signed)
08/05/2017 3:48 PM   Benjamin Stewart September 04, 1975 [id number]  Referring provider: Glean Hess, MD [address] [place], [place] [address]  Chief Complaint  Patient presents with  . Follow-up    MRI results    HPI: The patient is a 41 year old gentleman who presents today to discuss PSA results.  1. Prostate cancer Biopsy #1:(4/17) Gleason 3+3 = 6 prostate cancer.  7/12 cores positive. 5 of the cores ranging from 1-9%. He had 2 cores of approximately 37%.  Biopsy#2:(7/17) Gleason 3+3 = 6 prostate cancer. 5/12 cores positive 1 core was approximately 33%. The remaining 4 ranged from 3-8%  10/12 biopsy regions with at least one positive specimen on one of the above biopies (LMB and RLB only regions without positive core on both biopsies)  PSA was 5.8 in May 2018.PSA is 5.6 in October 2017. It was 6.5 in March 2017 at the time of his prostate biopsy.  Prior to that it was 7.0 in December 2016.  PSA has now risen to 8.4 in November 2018.  MRI prostate in December 2018 did show 2 PI-RADS 3-4 lesions  2. Microscopic Hematuria The patient has a history of microscopic hematuria. He saw urologist in 2013 and had a cystoscopy and CT at that time which was negative.  3. History of nephrolithiasis The patient passed a kidney stone in 2001. This was his only stone. He is having no symptoms of a kidney stone at this time.       PMH: Past Medical History:  Diagnosis Date  . Benign essential hematuria 12/04/2014   microscopic hematuria evaluation with CT and cysto - treated for 2 months with bactrim DS; no further f/u planned unless hematuria recurs.   . Controlled type 2 diabetes mellitus without complication, without long-term current use of insulin ([place]) 07/16/2015  . Essential (primary) hypertension 12/04/2014  . prostate cancer     Surgical History: Past Surgical History:  Procedure Laterality Date  . PARTIAL COLECTOMY  2005    Home  Medications:  Allergies as of 08/05/2017      Reactions   Percocet [oxycodone-acetaminophen] Nausea And Vomiting      Medication List        Accurate as of 08/05/17  3:48 PM. Always use your most recent med list.          amLODipine 10 MG tablet Commonly known as:  NORVASC TAKE 1 TABLET BY MOUTH ONCE DAILY   atorvastatin 10 MG tablet Commonly known as:  LIPITOR TAKE 1 TABLET BY MOUTH ONCE DAILY WITH  BREAKFAST   carvedilol 6.25 MG tablet Commonly known as:  COREG Take 6.25 mg by mouth daily.   colchicine 0.6 MG tablet One twice a day for gout flare - up to 5 days   glimepiride 2 MG tablet Commonly known as:  AMARYL TAKE 1 TABLET BY MOUTH ONCE DAILY BEFORE  BREAKFAST   hydrALAZINE 25 MG tablet Commonly known as:  APRESOLINE Take 25 mg by mouth daily.   hydrochlorothiazide 25 MG tablet Commonly known as:  HYDRODIURIL TAKE TWO TABLETS BY MOUTH ONCE DAILY   irbesartan 300 MG tablet Commonly known as:  AVAPRO TAKE 1 TABLET BY MOUTH ONCE DAILY   metFORMIN 500 MG tablet Commonly known as:  GLUCOPHAGE TAKE 1 TABLET BY MOUTH TWICE DAILY WITH MEALS   traMADol 50 MG tablet Commonly known as:  ULTRAM Take 1 tablet (50 mg total) by mouth every 8 (eight) hours as needed.  Allergies:  Allergies  Allergen Reactions  . Percocet [Oxycodone-Acetaminophen] Nausea And Vomiting    Family History: Family History  Problem Relation Age of Onset  . Diabetes Mother   . Heart disease Mother   . Hypertension Mother   . Diabetes Maternal Grandmother   . Diabetes Maternal Grandfather   . Prostate cancer Maternal Uncle     Social History:  reports that  has never smoked. he has never used smokeless tobacco. He reports that he does not drink alcohol or use drugs.  ROS: UROLOGY Frequent Urination?: No Hard to postpone urination?: No Burning/pain with urination?: No Get up at night to urinate?: Yes Leakage of urine?: No Urine stream starts and stops?: No Trouble  starting stream?: No Do you have to strain to urinate?: No Blood in urine?: No Urinary tract infection?: No Sexually transmitted disease?: No Injury to kidneys or bladder?: No Painful intercourse?: No Weak stream?: No Erection problems?: No Penile pain?: No  Gastrointestinal Nausea?: No Vomiting?: No Indigestion/heartburn?: No Diarrhea?: No Constipation?: No  Constitutional Fever: No Night sweats?: No Weight loss?: No Fatigue?: No  Skin Skin rash/lesions?: No Itching?: No  Eyes Blurred vision?: No Double vision?: No  Ears/Nose/Throat Sore throat?: No Sinus problems?: No  Hematologic/Lymphatic Swollen glands?: No Easy bruising?: No  Cardiovascular Leg swelling?: No Chest pain?: No  Respiratory Cough?: No Shortness of breath?: No  Endocrine Excessive thirst?: No  Musculoskeletal Back pain?: No Joint pain?: No  Neurological Headaches?: No Dizziness?: No  Psychologic Depression?: No Anxiety?: No  Physical Exam: BP (!) 156/101 (BP Location: Right Arm, Patient Position: Sitting, Cuff Size: Large)   Pulse 99   Ht 6\' 1"  (1.854 m)   Wt (!) 373 lb 9.6 oz (169.5 kg)   BMI 49.29 kg/m   Constitutional:  Alert and oriented, No acute distress. HEENT: [place] AT, moist mucus membranes.  Trachea midline, no masses. Cardiovascular: No clubbing, cyanosis, or edema. Respiratory: Normal respiratory effort, no increased work of breathing. GI: Abdomen is soft, nontender, nondistended, no abdominal masses GU: No CVA tenderness.  Skin: No rashes, bruises or suspicious lesions. Lymph: No cervical or inguinal adenopathy. Neurologic: Grossly intact, no focal deficits, moving all 4 extremities. Psychiatric: Normal mood and affect.  Laboratory Data: Lab Results  Component Value Date   WBC 10.3 10/19/2016   HGB 13.1 10/19/2016   HCT 39.9 10/19/2016   MCV 87 10/19/2016   PLT 395 (H) 10/19/2016    Lab Results  Component Value Date   CREATININE 2.61 (H)  02/15/2017    No results found for: PSA  No results found for: TESTOSTERONE  Lab Results  Component Value Date   HGBA1C 6.5 (H) 02/15/2017    Urinalysis    Component Value Date/Time   COLORURINE PINK 11/23/2012 0823   APPEARANCEUR Hazy (A) 10/24/2015 0930   LABSPEC 1.010 11/23/2012 0823   PHURINE 7.5 11/23/2012 0823   GLUCOSEU Negative 10/24/2015 0930   GLUCOSEU NEGATIVE 11/23/2012 0823   HGBUR 3+ 11/23/2012 0823   BILIRUBINUR neg 08/18/2016 1625   BILIRUBINUR Negative 10/24/2015 0930   BILIRUBINUR NEGATIVE 11/23/2012 0823   KETONESUR NEGATIVE 11/23/2012 0823   PROTEINUR 2000 ++++ 08/18/2016 1625   PROTEINUR 3+ (A) 10/24/2015 0930   PROTEINUR 100 mg/dL 11/23/2012 0823   UROBILINOGEN 0.2 08/18/2016 1625   NITRITE neg 08/18/2016 1625   NITRITE Negative 10/24/2015 0930   NITRITE NEGATIVE 11/23/2012 0823   LEUKOCYTESUR Negative 08/18/2016 1625   LEUKOCYTESUR Negative 10/24/2015 0930   LEUKOCYTESUR 1+ 11/23/2012 1194  Assessment & Plan:    1. Low risk prostate cancer I discussed with the patient his MRI report and his two PI-RADs 3-4 lesions.  I did strongly encourage him to consider a fusion biopsy to ensure that these areas are not more significant disease.  Quite possibly could be his known Gleason 6 disease however could also present more significant disease that would in fact need more active treatment.  He has been through the prostate biopsy process before.  Does understand the fusion biopsy will take slightly longer but is otherwise the same procedure.  All questions were answered.  The patient will have his fusion biopsy performed at [facility] urology and follow-up here for results.   Return for after fusion biopsy.  Nickie Retort, MD  [facility] [address] [place], [place] [address] [phone]

## 2017-08-19 ENCOUNTER — Other Ambulatory Visit: Payer: Self-pay | Admitting: Internal Medicine

## 2017-08-19 ENCOUNTER — Encounter: Payer: Self-pay | Admitting: Internal Medicine

## 2017-08-19 ENCOUNTER — Ambulatory Visit (INDEPENDENT_AMBULATORY_CARE_PROVIDER_SITE_OTHER): Payer: 59 | Admitting: Internal Medicine

## 2017-08-19 VITALS — BP 118/70 | HR 79 | Ht 73.0 in | Wt 378.0 lb

## 2017-08-19 DIAGNOSIS — E1122 Type 2 diabetes mellitus with diabetic chronic kidney disease: Secondary | ICD-10-CM | POA: Diagnosis not present

## 2017-08-19 DIAGNOSIS — Z0000 Encounter for general adult medical examination without abnormal findings: Secondary | ICD-10-CM

## 2017-08-19 DIAGNOSIS — E785 Hyperlipidemia, unspecified: Secondary | ICD-10-CM | POA: Diagnosis not present

## 2017-08-19 DIAGNOSIS — N183 Chronic kidney disease, stage 3 unspecified: Secondary | ICD-10-CM

## 2017-08-19 DIAGNOSIS — Z0001 Encounter for general adult medical examination with abnormal findings: Secondary | ICD-10-CM | POA: Diagnosis not present

## 2017-08-19 DIAGNOSIS — I10 Essential (primary) hypertension: Secondary | ICD-10-CM | POA: Diagnosis not present

## 2017-08-19 DIAGNOSIS — E1169 Type 2 diabetes mellitus with other specified complication: Secondary | ICD-10-CM | POA: Diagnosis not present

## 2017-08-19 DIAGNOSIS — I129 Hypertensive chronic kidney disease with stage 1 through stage 4 chronic kidney disease, or unspecified chronic kidney disease: Secondary | ICD-10-CM | POA: Diagnosis not present

## 2017-08-19 DIAGNOSIS — Z23 Encounter for immunization: Secondary | ICD-10-CM

## 2017-08-19 LAB — POCT URINALYSIS DIPSTICK
Bilirubin, UA: NEGATIVE
Glucose, UA: NEGATIVE
Ketones, UA: NEGATIVE
Leukocytes, UA: NEGATIVE
Nitrite, UA: NEGATIVE
Spec Grav, UA: 1.015
Urobilinogen, UA: 0.2 U/dL
pH, UA: 6

## 2017-08-19 NOTE — Progress Notes (Signed)
Date:  08/19/2017   Name:  ABBIE Stewart   DOB:  September 26, 1975   MRN:  [id number]   Chief Complaint: Annual Exam; Hypertension; Diabetes; and Hyperlipidemia Benjamin Stewart is a 42 y.o. male who presents today for his Complete Annual Exam. He feels well. He reports exercising none. He reports he is sleeping well. He has lost 40 lbs over the past year by cutting back on intake.  Hypertension  This is a chronic problem. The problem is controlled. Pertinent negatives include no chest pain, headaches, palpitations or shortness of breath. Risk factors for coronary artery disease include diabetes mellitus and dyslipidemia. Past treatments include angiotensin blockers, beta blockers, calcium channel blockers and direct vasodilators. The current treatment provides significant improvement.  Diabetes  He presents for his follow-up diabetic visit. He has type 2 diabetes mellitus. His disease course has been stable. Pertinent negatives for hypoglycemia include no dizziness or headaches. Pertinent negatives for diabetes include no chest pain and no fatigue. His weight is decreasing steadily. He is following a generally healthy diet. He monitors urine at home 1-2 x per month. An ACE inhibitor/angiotensin II receptor blocker is being taken. Eye exam is current.  Hyperlipidemia  This is a chronic problem. The problem is controlled. Pertinent negatives include no chest pain, myalgias or shortness of breath. Current antihyperlipidemic treatment includes statins and diet change. The current treatment provides significant improvement of lipids.  Prostate cancer - seeing Urology.  Recommended at fusion biopsy at [facility].  He has not yet heard from them to schedule the procedure.    Review of Systems  Constitutional: Negative for appetite change, chills, diaphoresis, fatigue and unexpected weight change.  HENT: Negative for hearing loss, trouble swallowing and voice change.   Eyes: Negative for visual  disturbance.  Respiratory: Negative for choking, shortness of breath and wheezing.   Cardiovascular: Negative for chest pain, palpitations and leg swelling.  Gastrointestinal: Negative for abdominal pain, constipation and diarrhea.  Genitourinary: Negative for difficulty urinating, dysuria and frequency.  Musculoskeletal: Negative for arthralgias, back pain and myalgias.  Skin: Negative for color change and rash.  Allergic/Immunologic: Negative for environmental allergies.  Neurological: Negative for dizziness, syncope and headaches.  Hematological: Negative for adenopathy.  Psychiatric/Behavioral: Negative for dysphoric mood and sleep disturbance.    Patient Active Problem List   Diagnosis Date Noted  . Prostate cancer ([place]) 08/23/2015  . Type 2 DM with CKD stage 3 and hypertension ([place]) 07/16/2015  . Hyperlipidemia associated with type 2 diabetes mellitus ([place]) 07/16/2015  . Chronic gout 12/04/2014  . Benign essential hematuria 12/04/2014  . Essential (primary) hypertension 12/04/2014  . Morbid obesity, unspecified obesity type ([place]) 12/04/2014    Prior to Admission medications   Medication Sig Start Date End Date Taking? Authorizing Provider  amLODipine (NORVASC) 10 MG tablet TAKE 1 TABLET BY MOUTH ONCE DAILY 04/30/17  Yes Glean Hess, MD  atorvastatin (LIPITOR) 10 MG tablet TAKE 1 TABLET BY MOUTH ONCE DAILY WITH  BREAKFAST 04/30/17  Yes Glean Hess, MD  carvedilol (COREG) 6.25 MG tablet Take 6.25 mg by mouth daily. 03/30/17  Yes [provider]  colchicine 0.6 MG tablet One twice a day for gout flare - up to 5 days 04/01/17  Yes Glean Hess, MD  glimepiride (AMARYL) 2 MG tablet TAKE 1 TABLET BY MOUTH ONCE DAILY BEFORE  BREAKFAST 03/01/17  Yes Glean Hess, MD  hydrALAZINE (APRESOLINE) 25 MG tablet Take 25 mg by mouth daily. 03/29/17  Yes  [provider]  irbesartan (AVAPRO) 300 MG tablet TAKE 1 TABLET BY MOUTH ONCE DAILY 04/30/17  Yes Glean Hess, MD  metFORMIN (GLUCOPHAGE) 500 MG tablet TAKE 1 TABLET BY MOUTH TWICE DAILY WITH MEALS 04/30/17  Yes Glean Hess, MD  traMADol (ULTRAM) 50 MG tablet Take 1 tablet (50 mg total) by mouth every 8 (eight) hours as needed. 04/16/17  Yes Glean Hess, MD    Allergies  Allergen Reactions  . Percocet [Oxycodone-Acetaminophen] Nausea And Vomiting    Past Surgical History:  Procedure Laterality Date  . PARTIAL COLECTOMY  2005    Social History   Tobacco Use  . Smoking status: Never Smoker  . Smokeless tobacco: Never Used  Substance Use Topics  . Alcohol use: No    Alcohol/week: 0.0 oz  . Drug use: No     Medication list has been reviewed and updated.  PHQ 2/9 Scores 08/19/2017 08/18/2016  PHQ - 2 Score 0 0   Wt Readings from Last 3 Encounters:  08/19/17 (!) 378 lb (171.5 kg)  08/05/17 (!) 373 lb 9.6 oz (169.5 kg)  06/25/17 (!) 373 lb 11.2 oz (169.5 kg)    Physical Exam  Constitutional: He is oriented to person, place, and time. He appears well-developed and well-nourished.  HENT:  Head: Normocephalic.  Right Ear: Tympanic membrane, external ear and ear canal normal.  Left Ear: Tympanic membrane, external ear and ear canal normal.  Nose: Nose normal.  Mouth/Throat: Uvula is midline and oropharynx is clear and moist.  Eyes: Conjunctivae and EOM are normal. Pupils are equal, round, and reactive to light.  Neck: Normal range of motion. Neck supple. Carotid bruit is not present. No thyromegaly present.  Cardiovascular: Normal rate, regular rhythm, normal heart sounds and intact distal pulses.  Pulmonary/Chest: Effort normal and breath sounds normal. He has no wheezes. Right breast exhibits no mass. Left breast exhibits no mass.  Abdominal: Soft. Normal appearance and bowel sounds are normal. There is no hepatosplenomegaly. There is no tenderness.    Musculoskeletal: Normal range of motion.  Lymphadenopathy:    He has no cervical adenopathy.  Neurological: He is  alert and oriented to person, place, and time. He has normal reflexes.  Skin: Skin is warm, dry and intact.  Psychiatric: He has a normal mood and affect. His speech is normal and behavior is normal. Judgment and thought content normal.  Nursing note and vitals reviewed.   BP 118/70   Pulse 79   Ht 6\' 1"  (1.854 m)   Wt (!) 378 lb (171.5 kg)   SpO2 100%   BMI 49.87 kg/m   Assessment and Plan: 1. Annual physical exam - POCT urinalysis dipstick - Microalbumin / creatinine urine ratio  2. Essential (primary) hypertension controlled - CBC with Differential/Platelet  3. Type 2 DM with CKD stage 3 and hypertension (HCC) Continue current therapy,weight loss - Comprehensive metabolic panel - Hemoglobin A1c  4. Hyperlipidemia associated with type 2 diabetes mellitus ([place]) On statins - Lipid panel  5. Morbid obesity, unspecified obesity type ([place]) Achieving slow steady weight loss  6. Need for influenza vaccination - Flu Vaccine QUAD 36+ mos IM   No orders of the defined types were placed in this encounter.   Partially dictated using Editor, commissioning. Any errors are unintentional.  Halina Maidens, MD [facility]  08/19/2017

## 2017-08-20 LAB — COMPREHENSIVE METABOLIC PANEL
ALT: 15 IU/L (ref 0–44)
AST: 17 IU/L (ref 0–40)
Albumin/Globulin Ratio: 1.3 (ref 1.2–2.2)
Albumin: 3.9 g/dL (ref 3.5–5.5)
Alkaline Phosphatase: 63 IU/L (ref 39–117)
BUN/Creatinine Ratio: 13 (ref 9–20)
BUN: 33 mg/dL — ABNORMAL HIGH (ref 6–24)
Bilirubin Total: 0.6 mg/dL (ref 0.0–1.2)
CALCIUM: 9.4 mg/dL (ref 8.7–10.2)
CO2: 21 mmol/L (ref 20–29)
CREATININE: 2.49 mg/dL — AB (ref 0.76–1.27)
Chloride: 108 mmol/L — ABNORMAL HIGH (ref 96–106)
GFR calc Af Amer: 35 mL/min/{1.73_m2} — ABNORMAL LOW (ref >59)
GFR, EST NON AFRICAN AMERICAN: 30 mL/min/{1.73_m2} — AB (ref >59)
Globulin, Total: 3 g/dL (ref 1.5–4.5)
Glucose: 79 mg/dL (ref 65–99)
POTASSIUM: 4.8 mmol/L (ref 3.5–5.2)
Sodium: 144 mmol/L (ref 134–144)
Total Protein: 6.9 g/dL (ref 6.0–8.5)

## 2017-08-20 LAB — CBC WITH DIFFERENTIAL/PLATELET
Basophils Absolute: 0.1 10*3/uL (ref 0.0–0.2)
Basos: 1 %
EOS (ABSOLUTE): 0.9 10*3/uL — AB (ref 0.0–0.4)
Eos: 10 %
Hematocrit: 38.3 % (ref 37.5–51.0)
Hemoglobin: 12.3 g/dL — ABNORMAL LOW (ref 13.0–17.7)
IMMATURE GRANULOCYTES: 0 %
Immature Grans (Abs): 0 10*3/uL (ref 0.0–0.1)
Lymphocytes Absolute: 1.4 10*3/uL (ref 0.7–3.1)
Lymphs: 16 %
MCH: 28.1 pg (ref 26.6–33.0)
MCHC: 32.1 g/dL (ref 31.5–35.7)
MCV: 87 fL (ref 79–97)
MONOS ABS: 1 10*3/uL — AB (ref 0.1–0.9)
Monocytes: 12 %
NEUTROS PCT: 61 %
Neutrophils Absolute: 5.4 10*3/uL (ref 1.4–7.0)
PLATELETS: 367 10*3/uL (ref 150–379)
RBC: 4.38 x10E6/uL (ref 4.14–5.80)
RDW: 14.4 % (ref 12.3–15.4)
WBC: 8.8 10*3/uL (ref 3.4–10.8)

## 2017-08-20 LAB — LIPID PANEL
CHOLESTEROL TOTAL: 129 mg/dL (ref 100–199)
Chol/HDL Ratio: 3.6 ratio (ref 0.0–5.0)
HDL: 36 mg/dL — AB (ref >39)
LDL Calculated: 80 mg/dL (ref 0–99)
Triglycerides: 64 mg/dL (ref 0–149)
VLDL CHOLESTEROL CAL: 13 mg/dL (ref 5–40)

## 2017-08-20 LAB — HEMOGLOBIN A1C
ESTIMATED AVERAGE GLUCOSE: 100 mg/dL
Hgb A1c MFr Bld: 5.1 % (ref 4.8–5.6)

## 2017-08-21 LAB — MICROALBUMIN / CREATININE URINE RATIO
CREATININE, UR: 40.5 mg/dL
Microalb/Creat Ratio: 1397.3 mg/g creat — ABNORMAL HIGH (ref 0.0–30.0)
Microalbumin, Urine: 565.9 ug/mL

## 2017-08-22 ENCOUNTER — Encounter: Payer: Self-pay | Admitting: Internal Medicine

## 2017-08-22 DIAGNOSIS — E1129 Type 2 diabetes mellitus with other diabetic kidney complication: Secondary | ICD-10-CM | POA: Insufficient documentation

## 2017-08-22 DIAGNOSIS — R809 Proteinuria, unspecified: Secondary | ICD-10-CM

## 2017-09-05 ENCOUNTER — Other Ambulatory Visit: Payer: Self-pay | Admitting: Internal Medicine

## 2017-09-06 DIAGNOSIS — C61 Malignant neoplasm of prostate: Secondary | ICD-10-CM | POA: Diagnosis not present

## 2017-09-09 ENCOUNTER — Other Ambulatory Visit: Payer: Self-pay | Admitting: Urology

## 2017-09-16 ENCOUNTER — Ambulatory Visit (INDEPENDENT_AMBULATORY_CARE_PROVIDER_SITE_OTHER): Payer: 59 | Admitting: Urology

## 2017-09-16 ENCOUNTER — Encounter: Payer: Self-pay | Admitting: Urology

## 2017-09-16 VITALS — BP 153/94 | HR 87 | Ht 72.0 in | Wt 375.0 lb

## 2017-09-16 DIAGNOSIS — C61 Malignant neoplasm of prostate: Secondary | ICD-10-CM

## 2017-09-16 NOTE — Progress Notes (Signed)
09/16/2017 12:23 PM   Benjamin Stewart [birth year] [id number]  Referring provider: Glean Hess, MD [address] [place], [place] [address]  Chief Complaint  Patient presents with  . Prostate Cancer    fusion biopsy results    HPI: The patient is a 42 year old gentleman who presents today to discuss MRI fusion prostate biopsy results.  1. Prostate cancer Biopsy #1:(4/17) Gleason 3+3 = 6 prostate cancer.  7/12 cores positive. 5 of the cores ranging from 1-9%. He had 2 cores of approximately 37%.  Biopsy#2:(7/17) Gleason 3+3 = 6 prostate cancer. 5/12 cores positive 1 core was approximately 33%. The remaining 4 ranged from 3-8%  Biopsy #3 (January 2019) 10/12 + positive ROI 1 of 2 on fusion 10/12 Gleason 3+4=7 (10-80%) - 4 Gleason 7 all on right. 6 Gleason 6 on left. ROI positive for Gleason 3+3=6  10/12 biopsy regions with at least one positive specimen on one of the above biopies (LMB and RLB only regions without positive core on both biopsies)  PSA was 5.8 in May 2018.PSA is 5.6 in October 2017. It was 6.5 in March 2017 at the time of his prostate biopsy.Prior to that it was 7.0 inDecember 2016.  PSA has now risen to 8.4 in November 2018.  MRI prostate in December 2018 did show 2 PI-RADS 3-4 lesions.  He underwent prostate fusion biopsy as discussed above in January 2019.   2. Microscopic Hematuria The patient has a history of microscopic hematuria. He saw urologist in 2013 and had a cystoscopy and CT at that time which was negative.  3. History of nephrolithiasis The patient passed a kidney stone in 2001. This was his only stone. He is having no symptoms of a kidney stone at this time.        PMH: Past Medical History:  Diagnosis Date  . Benign essential hematuria 12/04/2014   microscopic hematuria evaluation with CT and cysto - treated for 2 months with bactrim DS; no further f/u planned unless hematuria recurs.   .  Controlled type 2 diabetes mellitus without complication, without long-term current use of insulin ([place]) 07/16/2015  . Essential (primary) hypertension 12/04/2014  . prostate cancer     Surgical History: Past Surgical History:  Procedure Laterality Date  . PARTIAL COLECTOMY  2005    Home Medications:  Allergies as of 09/16/2017      Reactions   Percocet [oxycodone-acetaminophen] Nausea And Vomiting      Medication List        Accurate as of 09/16/17 12:23 PM. Always use your most recent med list.          amLODipine 10 MG tablet Commonly known as:  NORVASC TAKE 1 TABLET BY MOUTH ONCE DAILY   atorvastatin 10 MG tablet Commonly known as:  LIPITOR TAKE 1 TABLET BY MOUTH ONCE DAILY WITH  BREAKFAST   carvedilol 6.25 MG tablet Commonly known as:  COREG Take 6.25 mg by mouth daily.   colchicine 0.6 MG tablet One twice a day for gout flare - up to 5 days   glimepiride 2 MG tablet Commonly known as:  AMARYL TAKE 1 TABLET BY MOUTH ONCE DAILY WITH  BREAKFAST   hydrALAZINE 25 MG tablet Commonly known as:  APRESOLINE Take 25 mg by mouth daily.   irbesartan 300 MG tablet Commonly known as:  AVAPRO TAKE 1 TABLET BY MOUTH ONCE DAILY   metFORMIN 500 MG tablet Commonly known as:  GLUCOPHAGE TAKE 1 TABLET BY MOUTH TWICE DAILY  WITH MEALS   traMADol 50 MG tablet Commonly known as:  ULTRAM Take 1 tablet (50 mg total) by mouth every 8 (eight) hours as needed.       Allergies:  Allergies  Allergen Reactions  . Percocet [Oxycodone-Acetaminophen] Nausea And Vomiting    Family History: Family History  Problem Relation Age of Onset  . Diabetes Mother   . Heart disease Mother   . Hypertension Mother   . Diabetes Maternal Grandmother   . Diabetes Maternal Grandfather   . Prostate cancer Maternal Uncle     Social History:  reports that  has never smoked. he has never used smokeless tobacco. He reports that he does not drink alcohol or use drugs.  ROS: UROLOGY Frequent  Urination?: No Hard to postpone urination?: No Burning/pain with urination?: No Get up at night to urinate?: No Leakage of urine?: No Urine stream starts and stops?: No Trouble starting stream?: No Do you have to strain to urinate?: No Blood in urine?: No Urinary tract infection?: No Sexually transmitted disease?: No Injury to kidneys or bladder?: No Painful intercourse?: No Weak stream?: No Erection problems?: No Penile pain?: No  Gastrointestinal Nausea?: No Vomiting?: No Indigestion/heartburn?: No Diarrhea?: No Constipation?: No  Constitutional Fever: No Night sweats?: No Weight loss?: No Fatigue?: No  Skin Skin rash/lesions?: No Itching?: No  Eyes Blurred vision?: No Double vision?: No  Ears/Nose/Throat Sore throat?: No Sinus problems?: No  Hematologic/Lymphatic Swollen glands?: No Easy bruising?: No  Cardiovascular Leg swelling?: No Chest pain?: No  Respiratory Cough?: No Shortness of breath?: No  Endocrine Excessive thirst?: No  Musculoskeletal Back pain?: No Joint pain?: No  Neurological Headaches?: No Dizziness?: No  Psychologic Depression?: No Anxiety?: No  Physical Exam: BP (!) 153/94   Pulse 87   Ht 6' (1.829 m)   Wt (!) 375 lb (170.1 kg)   BMI 50.86 kg/m   Constitutional:  Alert and oriented, No acute distress. HEENT: [place] AT, moist mucus membranes.  Trachea midline, no masses. Cardiovascular: No clubbing, cyanosis, or edema. Respiratory: Normal respiratory effort, no increased work of breathing. GI: Abdomen is soft, nontender, nondistended, no abdominal masses GU: No CVA tenderness.  Skin: No rashes, bruises or suspicious lesions. Lymph: No cervical or inguinal adenopathy. Neurologic: Grossly intact, no focal deficits, moving all 4 extremities. Psychiatric: Normal mood and affect.  Laboratory Data: Lab Results  Component Value Date   WBC 8.8 08/19/2017   HGB 12.3 (L) 08/19/2017   HCT 38.3 08/19/2017   MCV 87  08/19/2017   PLT 367 08/19/2017    Lab Results  Component Value Date   CREATININE 2.49 (H) 08/19/2017    No results found for: PSA  No results found for: TESTOSTERONE  Lab Results  Component Value Date   HGBA1C 5.1 08/19/2017    Urinalysis    Component Value Date/Time   COLORURINE PINK 11/23/2012 0823   APPEARANCEUR Haz[place] (A) 10/24/2015 0930   LABSPEC 1.010 11/23/2012 0823   PHURINE 7.5 11/23/2012 0823   GLUCOSEU Negative 10/24/2015 0930   GLUCOSEU NEGATIVE 11/23/2012 0823   HGBUR 3+ 11/23/2012 0823   BILIRUBINUR neg 08/19/2017 0903   BILIRUBINUR Negative 10/24/2015 0930   BILIRUBINUR NEGATIVE 11/23/2012 0823   KETONESUR NEGATIVE 11/23/2012 0823   PROTEINUR 2,000+ 08/19/2017 0903   PROTEINUR 3+ (A) 10/24/2015 0930   PROTEINUR 100 mg/dL 11/23/2012 0823   UROBILINOGEN 0.2 08/19/2017 0903   NITRITE neg 08/19/2017 0903   NITRITE Negative 10/24/2015 0930   NITRITE NEGATIVE 11/23/2012 2694  LEUKOCYTESUR Negative 08/19/2017 0903   LEUKOCYTESUR Negative 10/24/2015 0930   LEUKOCYTESUR 1+ 11/23/2012 0823    Assessment & Plan:    1. Intermediate risk prostate cancer I discussed with the patient that his fusion prostate biopsy showed upgrade of his disease from low risk to intermediate risk.  We did discuss that at this point active treatment is generally recommended.  We did discuss both radiation therapy and a prostatectomy.  Ideally for his age he would be able to undergo robotic prostatectomy.  We did discuss that due to his body habitus that this is not likely possible.  We discussed the potential of undergoing a perineal prostatectomy.  We did discuss that this is not a very common procedure and very few urologists perform this procedure.  To my knowledge, the closest urologist that regularly performs this procedure is Dr. Rushie Nyhan at the [facility] in [place], [place].  I have offered him a referral to see Dr. Arbutus Ped.  I have also  offered him a referral to discuss radiation therapy further with our radiation oncologist locally.  He will follow-up in 1 month to discuss his options further after seeing the above consultants.  Return in about 4 weeks (around 10/14/2017).  Nickie Retort, MD  [facility] [address] [place],  [address] [phone]

## 2017-09-20 ENCOUNTER — Telehealth: Payer: Self-pay

## 2017-09-20 NOTE — Telephone Encounter (Signed)
Received fax from [facility] stating patients medication for Irbesartan has been recalled. Called [facility] in [place] and spoke with pharmacist who said his medication was not one of the lots recalled. There was only lot# recalled from there pharmacy and it was sent back in January and the patient was not affected.

## 2017-09-27 DIAGNOSIS — I10 Essential (primary) hypertension: Secondary | ICD-10-CM | POA: Diagnosis not present

## 2017-09-27 DIAGNOSIS — N184 Chronic kidney disease, stage 4 (severe): Secondary | ICD-10-CM | POA: Diagnosis not present

## 2017-09-27 DIAGNOSIS — E1122 Type 2 diabetes mellitus with diabetic chronic kidney disease: Secondary | ICD-10-CM | POA: Diagnosis not present

## 2017-09-27 DIAGNOSIS — N2581 Secondary hyperparathyroidism of renal origin: Secondary | ICD-10-CM | POA: Diagnosis not present

## 2017-09-30 ENCOUNTER — Ambulatory Visit
Admission: RE | Admit: 2017-09-30 | Discharge: 2017-09-30 | Disposition: A | Discharge status: Home / Self Care | Payer: 59 | Source: Ambulatory Visit | Attending: Radiation Oncology | Admitting: Radiation Oncology

## 2017-09-30 ENCOUNTER — Encounter: Payer: Self-pay | Admitting: Radiation Oncology

## 2017-09-30 ENCOUNTER — Other Ambulatory Visit: Payer: Self-pay | Admitting: Radiology

## 2017-09-30 ENCOUNTER — Other Ambulatory Visit: Payer: Self-pay

## 2017-09-30 ENCOUNTER — Institutional Professional Consult (permissible substitution): Payer: 59 | Admitting: Radiation Oncology

## 2017-09-30 VITALS — BP 137/89 | HR 80 | Temp 97.0°F | Resp 20 | Wt 379.9 lb

## 2017-09-30 DIAGNOSIS — E669 Obesity, unspecified: Secondary | ICD-10-CM | POA: Insufficient documentation

## 2017-09-30 DIAGNOSIS — Z79899 Other long term (current) drug therapy: Secondary | ICD-10-CM | POA: Diagnosis not present

## 2017-09-30 DIAGNOSIS — Z9049 Acquired absence of other specified parts of digestive tract: Secondary | ICD-10-CM | POA: Diagnosis not present

## 2017-09-30 DIAGNOSIS — C61 Malignant neoplasm of prostate: Secondary | ICD-10-CM | POA: Diagnosis not present

## 2017-09-30 DIAGNOSIS — I10 Essential (primary) hypertension: Secondary | ICD-10-CM | POA: Insufficient documentation

## 2017-09-30 DIAGNOSIS — Z7984 Long term (current) use of oral hypoglycemic drugs: Secondary | ICD-10-CM | POA: Insufficient documentation

## 2017-09-30 DIAGNOSIS — Z8042 Family history of malignant neoplasm of prostate: Secondary | ICD-10-CM | POA: Diagnosis not present

## 2017-09-30 DIAGNOSIS — E119 Type 2 diabetes mellitus without complications: Secondary | ICD-10-CM | POA: Diagnosis not present

## 2017-09-30 NOTE — Consult Note (Signed)
NEW PATIENT EVALUATION  Name: Benjamin Stewart  MRN: [id number]  Date:   09/30/2017     DOB: [birth year]   This 42 y.o. male patient presents to the clinic for initial evaluation of intermediate risk prostate cancer stage IIa (T1 cN0 M0) presenting with a PSA of. 8.4 Gleason 7 (3+4)  REFERRING PHYSICIAN: Glean Hess, MD  CHIEF COMPLAINT:  Chief Complaint  Patient presents with  . Prostate Cancer    Pt is here for initial consultation of prostate cancer.      DIAGNOSIS: The encounter diagnosis was Prostate cancer ([place]).   PREVIOUS INVESTIGATIONS:  Clinical notes reviewed MRI scan reviewed Pathology reports reviewed  HPI: Patient is a 42 year old obese male who originally presented with an elevated PSA back in April 2017 had 7 of 12 cores positive for Gleason 6 (3+3). He opted for watchful waiting recent PSA was 8.4 and repeat biopsy has shown 10 of 12 biopsies positive for Gleason 7 (3+4). A MRI scan back in December 2018 did show 3-4 lesions and underwent prostate fusion biopsy in January 2019. Patient is morbidly obese has been turned down for robotic prostatectomy here in [place] although arrangements are being made for a second opinion in [place] for possible peroneal prostatectomy. Patient specifically denies increased lower urinary tract symptoms. He has had a bowel resection remotely back in 2005. He is seen today for radiation oncology opinion. MRI scan a my review has a prostate volume of approximately 26 cc.  PLANNED TREATMENT REGIMEN: Possible I-125 interstitial implant  PAST MEDICAL HISTORY:  has a past medical history of Benign essential hematuria (12/04/2014), Controlled type 2 diabetes mellitus without complication, without long-term current use of insulin ([place]) (07/16/2015), Essential (primary) hypertension (12/04/2014), and prostate cancer.    PAST SURGICAL HISTORY:  Past Surgical History:  Procedure Laterality Date  . PARTIAL COLECTOMY  2005    FAMILY  HISTORY: family history includes Diabetes in his maternal grandfather, maternal grandmother, and mother; Heart disease in his mother; Hypertension in his mother; Prostate cancer in his maternal uncle.  SOCIAL HISTORY:  reports that  has never smoked. he has never used smokeless tobacco. He reports that he does not drink alcohol or use drugs.  ALLERGIES: Percocet [oxycodone-acetaminophen]  MEDICATIONS:  Current Outpatient Medications  Medication Sig Dispense Refill  . amLODipine (NORVASC) 10 MG tablet TAKE 1 TABLET BY MOUTH ONCE DAILY 30 tablet 5  . atorvastatin (LIPITOR) 10 MG tablet TAKE 1 TABLET BY MOUTH ONCE DAILY WITH  BREAKFAST 30 tablet 5  . carvedilol (COREG) 6.25 MG tablet Take 6.25 mg by mouth daily.    . colchicine 0.6 MG tablet One twice a day for gout flare - up to 5 days 30 tablet 2  . glimepiride (AMARYL) 2 MG tablet TAKE 1 TABLET BY MOUTH ONCE DAILY WITH  BREAKFAST 30 tablet 5  . hydrALAZINE (APRESOLINE) 100 MG tablet Take 100 mg by mouth 3 (three) times daily.     . irbesartan (AVAPRO) 300 MG tablet TAKE 1 TABLET BY MOUTH ONCE DAILY 30 tablet 5  . metFORMIN (GLUCOPHAGE) 500 MG tablet TAKE 1 TABLET BY MOUTH TWICE DAILY WITH MEALS 60 tablet 5  . traMADol (ULTRAM) 50 MG tablet Take 1 tablet (50 mg total) by mouth every 8 (eight) hours as needed. (Patient not taking: Reported on 09/30/2017) 15 tablet 0   No current facility-administered medications for this encounter.     ECOG PERFORMANCE STATUS:  0 - Asymptomatic  REVIEW OF SYSTEMS:  Patient denies  any weight loss, fatigue, weakness, fever, chills or night sweats. Patient denies any loss of vision, blurred vision. Patient denies any ringing  of the ears or hearing loss. No irregular heartbeat. Patient denies heart murmur or history of fainting. Patient denies any chest pain or pain radiating to her upper extremities. Patient denies any shortness of breath, difficulty breathing at night, cough or hemoptysis. Patient denies any  swelling in the lower legs. Patient denies any nausea vomiting, vomiting of blood, or coffee ground material in the vomitus. Patient denies any stomach pain. Patient states has had normal bowel movements no significant constipation or diarrhea. Patient denies any dysuria, hematuria or significant nocturia. Patient denies any problems walking, swelling in the joints or loss of balance. Patient denies any skin changes, loss of hair or loss of weight. Patient denies any excessive worrying or anxiety or significant depression. Patient denies any problems with insomnia. Patient denies excessive thirst, polyuria, polydipsia. Patient denies any swollen glands, patient denies easy bruising or easy bleeding. Patient denies any recent infections, allergies or URI. Patient "s visual fields have not changed significantly in recent time.    PHYSICAL EXAM: BP 137/89   Pulse 80   Temp (!) 97 F (36.1 C)   Resp 20   Wt (!) 379 lb 13.6 oz (172.3 kg)   BMI 51.52 kg/m  Morbidly obese male in NAD. On rectal exam rectal sphincter tone is good prostate is smooth contracted without evidence of nodularity or mass. Sulcus is preserved bilaterally no other rectal abnormality is identified. Well-developed well-nourished patient in NAD. HEENT reveals PERLA, EOMI, discs not visualized.  Oral cavity is clear. No oral mucosal lesions are identified. Neck is clear without evidence of cervical or supraclavicular adenopathy. Lungs are clear to A&P. Cardiac examination is essentially unremarkable with regular rate and rhythm without murmur rub or thrill. Abdomen is benign with no organomegaly or masses noted. Motor sensory and DTR levels are equal and symmetric in the upper and lower extremities. Cranial nerves II through XII are grossly intact. Proprioception is intact. No peripheral adenopathy or edema is identified. No motor or sensory levels are noted. Crude visual fields are within normal range.  LABORATORY DATA: Pathology reports  reviewed    RADIOLOGY RESULTS: MRI scan of his prostate is reviewed and compatible with the above-stated findings   IMPRESSION: Stage IIa adenocarcinoma the prostate in patient morbidly obese not A surgical candidate for robotic prostatectomy  PLAN: At the present time I got over risks and benefits of both surgery and radiation therapy. I believe the patient would be a candidate for I-125 interstitial implant with curative intent. I have run his [facility] nomogram showing a progression free probability of 60% at 10 years. He has a 25% probability of organ confined disease a percent chance of lymph node involvement and 12% chance of seminal vesicle involvement. I believe even with his morbid obesity he would be a good candidate for I-125 interstitial implant with curative intent. Risks and benefits of the procedure including increased lower urinary tract symptoms possible diarrhea fatigue risks of anesthesia and radiation safety precautions all were discussed in detail with the patient. I will tentatively go ahead and's schedule him for volume study in about a month's time at which time he will have enough time to review his surgical options and possibly discuss this with the surgeon urologist he has been referred to in [place]. Patient copy has my treatment plan well. We will coordinate with urology's office for volume study  and seed implantation.  I would like to take this opportunity to thank you for allowing me to participate in the care of your patient.Noreene Filbert, MD

## 2017-10-06 ENCOUNTER — Encounter: Payer: Self-pay | Admitting: Internal Medicine

## 2017-10-06 ENCOUNTER — Other Ambulatory Visit: Payer: Self-pay | Admitting: Internal Medicine

## 2017-10-06 MED ORDER — OLMESARTAN MEDOXOMIL 40 MG PO TABS: 40.0000 mg | ORAL_TABLET | Freq: Every day | ORAL | 5 refills | Status: DC

## 2017-10-08 ENCOUNTER — Ambulatory Visit (INDEPENDENT_AMBULATORY_CARE_PROVIDER_SITE_OTHER): Payer: 59 | Admitting: Urology

## 2017-10-08 ENCOUNTER — Encounter: Payer: Self-pay | Admitting: Urology

## 2017-10-08 VITALS — BP 131/79 | HR 88 | Ht 72.0 in | Wt 381.5 lb

## 2017-10-08 DIAGNOSIS — C61 Malignant neoplasm of prostate: Secondary | ICD-10-CM

## 2017-10-08 NOTE — Progress Notes (Signed)
10/08/2017 3:51 PM   Benjamin Stewart [birth year] [id number]  Referring provider: Glean Hess, MD [address] [place], [place] [address]  Chief Complaint  Patient presents with  . Follow-up    prostate cancer     HPI: The patient is a 42 year old gentleman who presents today to discuss treatment for his intermediate risk prostate cancer.   1. Prostate cancer Biopsy #1:(4/17) Gleason 3+3 = 6 prostate cancer.  7/12 cores positive. 5 of the cores ranging from 1-9%. He had 2 cores of approximately 37%.  Biopsy#2:(7/17) Gleason 3+3 = 6 prostate cancer. 5/12 cores positive 1 core was approximately 33%. The remaining 4 ranged from 3-8%  Biopsy #3 (January 2019) 10/12 + positive ROI 1 of 2 on fusion 10/12 Gleason 3+4=7 (10-80%) - 4 Gleason 7 all on right. 6 Gleason 6 on left. ROI positive for Gleason 3+3=6  10/12 biopsy regions with at least one positive specimen on one of the above biopies (LMB and RLB only regions without positive core on both biopsies)  PSA was 5.8 in May 2018.PSA is 5.6 in October 2017. It was 6.5 in March 2017 at the time of his prostate biopsy.Prior to that it was 7.0 inDecember 2016.  PSA has now risen to 8.4 in November 2018.  MRI prostate in December 2018 did show 2PI-RADS 3-4 lesions.  He underwent prostate fusion biopsy as discussed above in January 2019.   Today: At his last visit we discussed the recommendation undergo active treatment of his disease.  We did discuss that he is not a good candidate for robotic prostatectomy due to his body habitus.  He has since seen Dr. Baruch Gouty and discussed undergoing brachytherapy which was tentatively scheduled.  He also had a referral to Dr. Rushie Nyhan at the [facility] for consideration of the perineal prostatectomy.  He was unable to schedule appointment with this office though.  He has since elected to proceed with brachytherapy.  2. Microscopic  Hematuria The patient has a history of microscopic hematuria. He saw urologist in 2013 and had a cystoscopy and CT at that time which was negative.  3. History of nephrolithiasis The patient passed a kidney stone in 2001. This was his only stone. He is having no symptoms of a kidney stone at this time.    PMH: Past Medical History:  Diagnosis Date  . Benign essential hematuria 12/04/2014   microscopic hematuria evaluation with CT and cysto - treated for 2 months with bactrim DS; no further f/u planned unless hematuria recurs.   . Controlled type 2 diabetes mellitus without complication, without long-term current use of insulin ([place]) 07/16/2015  . Essential (primary) hypertension 12/04/2014  . prostate cancer     Surgical History: Past Surgical History:  Procedure Laterality Date  . PARTIAL COLECTOMY  2005    Home Medications:  Allergies as of 10/08/2017      Reactions   Percocet [oxycodone-acetaminophen] Nausea And Vomiting      Medication List        Accurate as of 10/08/17  3:51 PM. Always use your most recent med list.          amLODipine 10 MG tablet Commonly known as:  NORVASC TAKE 1 TABLET BY MOUTH ONCE DAILY   atorvastatin 10 MG tablet Commonly known as:  LIPITOR TAKE 1 TABLET BY MOUTH ONCE DAILY WITH  BREAKFAST   carvedilol 6.25 MG tablet Commonly known as:  COREG Take 6.25 mg by mouth daily.  colchicine 0.6 MG tablet One twice a day for gout flare - up to 5 days   glimepiride 2 MG tablet Commonly known as:  AMARYL TAKE 1 TABLET BY MOUTH ONCE DAILY WITH  BREAKFAST   hydrALAZINE 100 MG tablet Commonly known as:  APRESOLINE Take 100 mg by mouth 3 (three) times daily.   metFORMIN 500 MG tablet Commonly known as:  GLUCOPHAGE TAKE 1 TABLET BY MOUTH TWICE DAILY WITH MEALS   olmesartan 40 MG tablet Commonly known as:  BENICAR Take 1 tablet (40 mg total) by mouth daily.   traMADol 50 MG tablet Commonly known as:  ULTRAM Take 1 tablet (50 mg  total) by mouth every 8 (eight) hours as needed.       Allergies:  Allergies  Allergen Reactions  . Percocet [Oxycodone-Acetaminophen] Nausea And Vomiting    Family History: Family History  Problem Relation Age of Onset  . Diabetes Mother   . Heart disease Mother   . Hypertension Mother   . Diabetes Maternal Grandmother   . Diabetes Maternal Grandfather   . Prostate cancer Maternal Uncle     Social History:  reports that  has never smoked. he has never used smokeless tobacco. He reports that he does not drink alcohol or use drugs.  ROS: UROLOGY Frequent Urination?: No Hard to postpone urination?: No Burning/pain with urination?: No Get up at night to urinate?: No Leakage of urine?: No Urine stream starts and stops?: No Trouble starting stream?: No Do you have to strain to urinate?: No Blood in urine?: No Urinary tract infection?: No Sexually transmitted disease?: No Injury to kidneys or bladder?: No Painful intercourse?: No Weak stream?: No Erection problems?: No Penile pain?: No  Gastrointestinal Nausea?: No Vomiting?: No Indigestion/heartburn?: No Diarrhea?: No Constipation?: No  Constitutional Fever: No Night sweats?: No Weight loss?: No Fatigue?: No  Skin Skin rash/lesions?: No Itching?: No  Eyes Blurred vision?: No Double vision?: No  Ears/Nose/Throat Sore throat?: No Sinus problems?: Yes  Hematologic/Lymphatic Swollen glands?: No Easy bruising?: No  Cardiovascular Leg swelling?: No Chest pain?: No  Respiratory Cough?: No Shortness of breath?: No  Endocrine Excessive thirst?: No  Musculoskeletal Back pain?: No Joint pain?: No  Neurological Headaches?: No Dizziness?: No  Psychologic Depression?: No Anxiety?: No  Physical Exam: BP 131/79   Pulse 88   Ht 6' (1.829 m)   Wt (!) 381 lb 8 oz (173 kg)   SpO2 100%   BMI 51.74 kg/m   Constitutional:  Alert and oriented, No acute distress. HEENT: [place] AT, moist mucus  membranes.  Trachea midline, no masses. Cardiovascular: No clubbing, cyanosis, or edema. Respiratory: Normal respiratory effort, no increased work of breathing. GI: Abdomen is soft, nontender, nondistended, no abdominal masses GU: No CVA tenderness.  Skin: No rashes, bruises or suspicious lesions. Lymph: No cervical or inguinal adenopathy. Neurologic: Grossly intact, no focal deficits, moving all 4 extremities. Psychiatric: Normal mood and affect.  Laboratory Data: Lab Results  Component Value Date   WBC 8.8 08/19/2017   HGB 12.3 (L) 08/19/2017   HCT 38.3 08/19/2017   MCV 87 08/19/2017   PLT 367 08/19/2017    Lab Results  Component Value Date   CREATININE 2.49 (H) 08/19/2017    No results found for: PSA  No results found for: TESTOSTERONE  Lab Results  Component Value Date   HGBA1C 5.1 08/19/2017    Urinalysis    Component Value Date/Time   COLORURINE PINK 11/23/2012 0823   APPEARANCEUR Haz[place] (A) 10/24/2015  0930   LABSPEC 1.010 11/23/2012 0823   PHURINE 7.5 11/23/2012 0823   GLUCOSEU Negative 10/24/2015 0930   GLUCOSEU NEGATIVE 11/23/2012 0823   HGBUR 3+ 11/23/2012 0823   BILIRUBINUR neg 08/19/2017 0903   BILIRUBINUR Negative 10/24/2015 0930   BILIRUBINUR NEGATIVE 11/23/2012 0823   KETONESUR NEGATIVE 11/23/2012 0823   PROTEINUR 2,000+ 08/19/2017 0903   PROTEINUR 3+ (A) 10/24/2015 0930   PROTEINUR 100 mg/dL 11/23/2012 0823   UROBILINOGEN 0.2 08/19/2017 0903   NITRITE neg 08/19/2017 0903   NITRITE Negative 10/24/2015 0930   NITRITE NEGATIVE 11/23/2012 0823   LEUKOCYTESUR Negative 08/19/2017 0903   LEUKOCYTESUR Negative 10/24/2015 0930   LEUKOCYTESUR 1+ 11/23/2012 0823    Assessment & Plan:    1. Intermediate risk prostate cancer The patient after meeting with radiation oncology is elected to proceed with brachytherapy.  The patient is actually already scheduled for this.  All remaining questions regarding his prostate cancer were answered.  Patient will  follow-up in our office a few months after undergoing seed placement for a PSA.  Return for as scheduled for brachytherapy.  Nickie Retort, MD  [facility] [address], [place], [place] [address] [phone]

## 2017-10-19 ENCOUNTER — Other Ambulatory Visit: Payer: Self-pay | Admitting: Internal Medicine

## 2017-10-19 DIAGNOSIS — I10 Essential (primary) hypertension: Secondary | ICD-10-CM

## 2017-10-29 ENCOUNTER — Other Ambulatory Visit: Payer: Self-pay | Admitting: Internal Medicine

## 2017-10-29 DIAGNOSIS — I10 Essential (primary) hypertension: Secondary | ICD-10-CM

## 2017-11-08 ENCOUNTER — Other Ambulatory Visit: Payer: Self-pay

## 2017-11-08 ENCOUNTER — Encounter: Payer: Self-pay | Admitting: Radiation Oncology

## 2017-11-08 ENCOUNTER — Ambulatory Visit
Admission: RE | Admit: 2017-11-08 | Discharge: 2017-11-08 | Disposition: A | Discharge status: Home / Self Care | Payer: 59 | Source: Ambulatory Visit | Attending: Radiation Oncology | Admitting: Radiation Oncology

## 2017-11-08 ENCOUNTER — Ambulatory Visit: Admission: RE | Admit: 2017-11-08 | Payer: 59 | Source: Ambulatory Visit | Admitting: Urology

## 2017-11-08 VITALS — BP 164/99 | HR 93 | Temp 98.2°F | Resp 20 | Wt 375.2 lb

## 2017-11-08 DIAGNOSIS — Z7984 Long term (current) use of oral hypoglycemic drugs: Secondary | ICD-10-CM | POA: Insufficient documentation

## 2017-11-08 DIAGNOSIS — E119 Type 2 diabetes mellitus without complications: Secondary | ICD-10-CM | POA: Insufficient documentation

## 2017-11-08 DIAGNOSIS — I10 Essential (primary) hypertension: Secondary | ICD-10-CM | POA: Diagnosis not present

## 2017-11-08 DIAGNOSIS — Z79899 Other long term (current) drug therapy: Secondary | ICD-10-CM | POA: Diagnosis not present

## 2017-11-08 DIAGNOSIS — C61 Malignant neoplasm of prostate: Secondary | ICD-10-CM

## 2017-11-08 DIAGNOSIS — Z8042 Family history of malignant neoplasm of prostate: Secondary | ICD-10-CM | POA: Insufficient documentation

## 2017-11-08 SURGERY — ULTRASOUND, PROSTATE, FOR VOLUME DETERMINATION
Anesthesia: Choice

## 2017-11-08 NOTE — H&P (View-Only) (Signed)
History and physical prior to I-125 interstitial implant  Name: Benjamin Stewart  MRN: [id number]  Date:   11/08/2017     DOB: [birth year]   This 42 y.o. male patient presents to the clinic for volume study in anticipation of I-125 interstitial implant for adenocarcinoma the prostate stage IIa  REFERRING PHYSICIAN: Glean Hess, MD  CHIEF COMPLAINT:  Chief Complaint  Patient presents with  . Prostate Cancer    Pt is here post volume study.       DIAGNOSIS: The encounter diagnosis was Prostate cancer ([place]).   PREVIOUS INVESTIGATIONS:  clinical notes reviewed  HPI: patient is a 42 year old male who presented with intermediate risk prostate cancer stage IIa (T1 CN 0 M0) Gleason score of 7 (3+4) presenting with a PSA of 8.4.patient is morbidly obese was turned down for robotic prostatectomy and was referred to radiation oncology where the patient is chose I-125 interstitial implant for cure.  PLANNED TREATMENT REGIMEN: I-125 interstitial implant  PAST MEDICAL HISTORY:  has a past medical history of Benign essential hematuria (12/04/2014), Controlled type 2 diabetes mellitus without complication, without long-term current use of insulin ([place]) (07/16/2015), Essential (primary) hypertension (12/04/2014), and prostate cancer.  He is seen today for volume study and history and physical in anticipation of I-125 interstitial implant he is doing well and is without complaint.  PAST SURGICAL HISTORY:  Past Surgical History:  Procedure Laterality Date  . PARTIAL COLECTOMY  2005    FAMILY HISTORY: family history includes Diabetes in his maternal grandfather, maternal grandmother, and mother; Heart disease in his mother; Hypertension in his mother; Prostate cancer in his maternal uncle.  SOCIAL HISTORY:  reports that he has never smoked. He has never used smokeless tobacco. He reports that he does not drink alcohol or use drugs.  ALLERGIES: Percocet [oxycodone-acetaminophen]  MEDICATIONS:   Current Outpatient Medications  Medication Sig Dispense Refill  . amLODipine (NORVASC) 10 MG tablet TAKE 1 TABLET BY MOUTH ONCE DAILY 90 tablet 0  . atorvastatin (LIPITOR) 10 MG tablet TAKE 1 TABLET BY MOUTH ONCE DAILY WITH BREAKFAST 90 tablet 0  . carvedilol (COREG) 6.25 MG tablet Take 6.25 mg by mouth 2 (two) times daily with a meal.     . colchicine 0.6 MG tablet One twice a day for gout flare - up to 5 days (Patient taking differently: Take 0.6 mg by mouth daily as needed. One twice a day for gout flare) 30 tablet 2  . glimepiride (AMARYL) 2 MG tablet TAKE 1 TABLET BY MOUTH ONCE DAILY WITH  BREAKFAST (Patient taking differently: TAKE 2 MG BY MOUTH ONCE DAILY WITH  BREAKFAST) 30 tablet 5  . hydrALAZINE (APRESOLINE) 100 MG tablet Take 100 mg by mouth 3 (three) times daily.     . metFORMIN (GLUCOPHAGE) 500 MG tablet TAKE 1 TABLET BY MOUTH TWICE DAILY WITH MEALS (Patient taking differently: TAKE 500 MG BY MOUTH TWICE DAILY WITH MEALS) 60 tablet 5  . olmesartan (BENICAR) 40 MG tablet Take 1 tablet (40 mg total) by mouth daily. 30 tablet 5  . traMADol (ULTRAM) 50 MG tablet Take 1 tablet (50 mg total) by mouth every 8 (eight) hours as needed. 15 tablet 0   No current facility-administered medications for this encounter.     ECOG PERFORMANCE STATUS:  0 - Asymptomatic  REVIEW OF SYSTEMS:  Patient denies any weight loss, fatigue, weakness, fever, chills or night sweats. Patient denies any loss of vision, blurred vision. Patient denies any ringing  of the ears  or hearing loss. No irregular heartbeat. Patient denies heart murmur or history of fainting. Patient denies any chest pain or pain radiating to her upper extremities. Patient denies any shortness of breath, difficulty breathing at night, cough or hemoptysis. Patient denies any swelling in the lower legs. Patient denies any nausea vomiting, vomiting of blood, or coffee ground material in the vomitus. Patient denies any stomach pain. Patient states  has had normal bowel movements no significant constipation or diarrhea. Patient denies any dysuria, hematuria or significant nocturia. Patient denies any problems walking, swelling in the joints or loss of balance. Patient denies any skin changes, loss of hair or loss of weight. Patient denies any excessive worrying or anxiety or significant depression. Patient denies any problems with insomnia. Patient denies excessive thirst, polyuria, polydipsia. Patient denies any swollen glands, patient denies easy bruising or easy bleeding. Patient denies any recent infections, allergies or URI. Patient "s visual fields have not changed significantly in recent time.    PHYSICAL EXAM: BP (!) 164/99   Pulse 93   Temp 98.2 F (36.8 C)   Resp 20   Wt (!) 375 lb 3.6 oz (170.2 kg)   BMI 50.89 kg/m  Well-developed well-nourished patient in NAD. HEENT reveals PERLA, EOMI, discs not visualized.  Oral cavity is clear. No oral mucosal lesions are identified. Neck is clear without evidence of cervical or supraclavicular adenopathy. Lungs are clear to A&P. Cardiac examination is essentially unremarkable with regular rate and rhythm without murmur rub or thrill. Abdomen is benign with no organomegaly or masses noted. Motor sensory and DTR levels are equal and symmetric in the upper and lower extremities. Cranial nerves II through XII are grossly intact. Proprioception is intact. No peripheral adenopathy or edema is identified. No motor or sensory levels are noted. Crude visual fields are within normal range.  LABORATORY DATA: pathology reports reviewed    RADIOLOGY RESULTS:ultrasound guidance used for volume study   IMPRESSION: adenocarcinoma the prostate in 42 year old male for I-125 interstitial implant.  PLAN: patient underwent 5 study today which is tolerated successfully. He is planned in 1 month for I-125 interstitial implant. Risks and benefits of that procedure were reviewed with the patient radiation safety  precautions were reviewed with the patient. Patient signed consent. Patient is to call with any concerns at any time prior to the procedure. He is not on aspirin.  I would like to take this opportunity to thank you for allowing me to participate in the care of your patient.Noreene Filbert, MD

## 2017-11-08 NOTE — Progress Notes (Signed)
Radiation Oncology Follow up Note  Name: Benjamin Stewart   Date:   09/30/2017 MRN:  [id number] DOB: January 19, 1976    This 43 y.o. male presents to the OR today for volume study in anticipation of I-125 interstitial implant for adenocarcinoma the prostate  REFERRING PROVIDER: No ref. provider found  HPI:  patient is a 42 year old male who presented with intermediate risk prostate cancer stage IIa (T1 CN 0 M0) Gleason score of 7 (3+4) presenting with a PSA of 8.4.patient is morbidly obese was turned down for robotic prostatectomy and was referred to radiation oncology where the patient is chose I-125 interstitial implant for cure.  .  COMPLICATIONS OF TREATMENT: none  FOLLOW UP COMPLIANCE: keeps appointments   PHYSICAL EXAM:  There were no vitals taken for this visit. Well-developed well-nourished patient in NAD. HEENT reveals PERLA, EOMI, discs not visualized.  Oral cavity is clear. No oral mucosal lesions are identified. Neck is clear without evidence of cervical or supraclavicular adenopathy. Lungs are clear to A&P. Cardiac examination is essentially unremarkable with regular rate and rhythm without murmur rub or thrill. Abdomen is benign with no organomegaly or masses noted. Motor sensory and DTR levels are equal and symmetric in the upper and lower extremities. Cranial nerves II through XII are grossly intact. Proprioception is intact. No peripheral adenopathy or edema is identified. No motor or sensory levels are noted. Crude visual fields are within normal range.  RADIOLOGY RESULTS: ultrasound use for volume study  PLAN: Patient was taken to the cystoscopy suite in the OR. Patient was placed in the low lithotomy position. Foley catheter was placed. Trans-rectal ultrasound probe was inserted into the rectum and prostate seminal vesicles were visualized as well as bladder base. stepping images were performed on a 5 mm increments. Images will be placed in [facility] treatment planning system  to determine seed placement coordinates for eventual I-125 interstitial implant. Images will be reviewed with the physics and dosimetry staff for final quality approval. I personally was present for the volume study and assisted in delineation of contour volumes.  At the end of the procedure Foley catheter was removed, rectal ultrasound probe was removed. Patient tolerated his procedures extremely well with no side effects or complaints. Patient has given appointment for interstitial implant date. Consent was signed today as well as history and physical performed in preparation for his outpatient surgical implant.      Noreene Filbert, MD

## 2017-11-08 NOTE — Progress Notes (Signed)
History and physical prior to I-125 interstitial implant  Name: Benjamin Stewart  MRN: [id number]  Date:   11/08/2017     DOB: [birth year]   This 42 y.o. male patient presents to the clinic for volume study in anticipation of I-125 interstitial implant for adenocarcinoma the prostate stage IIa  REFERRING PHYSICIAN: Glean Hess, MD  CHIEF COMPLAINT:  Chief Complaint  Patient presents with  . Prostate Cancer    Pt is here post volume study.       DIAGNOSIS: The encounter diagnosis was Prostate cancer ([place]).   PREVIOUS INVESTIGATIONS:  clinical notes reviewed  HPI: patient is a 42 year old male who presented with intermediate risk prostate cancer stage IIa (T1 CN 0 M0) Gleason score of 7 (3+4) presenting with a PSA of 8.4.patient is morbidly obese was turned down for robotic prostatectomy and was referred to radiation oncology where the patient is chose I-125 interstitial implant for cure.  PLANNED TREATMENT REGIMEN: I-125 interstitial implant  PAST MEDICAL HISTORY:  has a past medical history of Benign essential hematuria (12/04/2014), Controlled type 2 diabetes mellitus without complication, without long-term current use of insulin ([place]) (07/16/2015), Essential (primary) hypertension (12/04/2014), and prostate cancer.  He is seen today for volume study and history and physical in anticipation of I-125 interstitial implant he is doing well and is without complaint.  PAST SURGICAL HISTORY:  Past Surgical History:  Procedure Laterality Date  . PARTIAL COLECTOMY  2005    FAMILY HISTORY: family history includes Diabetes in his maternal grandfather, maternal grandmother, and mother; Heart disease in his mother; Hypertension in his mother; Prostate cancer in his maternal uncle.  SOCIAL HISTORY:  reports that he has never smoked. He has never used smokeless tobacco. He reports that he does not drink alcohol or use drugs.  ALLERGIES: Percocet [oxycodone-acetaminophen]  MEDICATIONS:   Current Outpatient Medications  Medication Sig Dispense Refill  . amLODipine (NORVASC) 10 MG tablet TAKE 1 TABLET BY MOUTH ONCE DAILY 90 tablet 0  . atorvastatin (LIPITOR) 10 MG tablet TAKE 1 TABLET BY MOUTH ONCE DAILY WITH BREAKFAST 90 tablet 0  . carvedilol (COREG) 6.25 MG tablet Take 6.25 mg by mouth 2 (two) times daily with a meal.     . colchicine 0.6 MG tablet One twice a day for gout flare - up to 5 days (Patient taking differently: Take 0.6 mg by mouth daily as needed. One twice a day for gout flare) 30 tablet 2  . glimepiride (AMARYL) 2 MG tablet TAKE 1 TABLET BY MOUTH ONCE DAILY WITH  BREAKFAST (Patient taking differently: TAKE 2 MG BY MOUTH ONCE DAILY WITH  BREAKFAST) 30 tablet 5  . hydrALAZINE (APRESOLINE) 100 MG tablet Take 100 mg by mouth 3 (three) times daily.     . metFORMIN (GLUCOPHAGE) 500 MG tablet TAKE 1 TABLET BY MOUTH TWICE DAILY WITH MEALS (Patient taking differently: TAKE 500 MG BY MOUTH TWICE DAILY WITH MEALS) 60 tablet 5  . olmesartan (BENICAR) 40 MG tablet Take 1 tablet (40 mg total) by mouth daily. 30 tablet 5  . traMADol (ULTRAM) 50 MG tablet Take 1 tablet (50 mg total) by mouth every 8 (eight) hours as needed. 15 tablet 0   No current facility-administered medications for this encounter.     ECOG PERFORMANCE STATUS:  0 - Asymptomatic  REVIEW OF SYSTEMS:  Patient denies any weight loss, fatigue, weakness, fever, chills or night sweats. Patient denies any loss of vision, blurred vision. Patient denies any ringing  of the ears  or hearing loss. No irregular heartbeat. Patient denies heart murmur or history of fainting. Patient denies any chest pain or pain radiating to her upper extremities. Patient denies any shortness of breath, difficulty breathing at night, cough or hemoptysis. Patient denies any swelling in the lower legs. Patient denies any nausea vomiting, vomiting of blood, or coffee ground material in the vomitus. Patient denies any stomach pain. Patient states  has had normal bowel movements no significant constipation or diarrhea. Patient denies any dysuria, hematuria or significant nocturia. Patient denies any problems walking, swelling in the joints or loss of balance. Patient denies any skin changes, loss of hair or loss of weight. Patient denies any excessive worrying or anxiety or significant depression. Patient denies any problems with insomnia. Patient denies excessive thirst, polyuria, polydipsia. Patient denies any swollen glands, patient denies easy bruising or easy bleeding. Patient denies any recent infections, allergies or URI. Patient "s visual fields have not changed significantly in recent time.    PHYSICAL EXAM: BP (!) 164/99   Pulse 93   Temp 98.2 F (36.8 C)   Resp 20   Wt (!) 375 lb 3.6 oz (170.2 kg)   BMI 50.89 kg/m  Well-developed well-nourished patient in NAD. HEENT reveals PERLA, EOMI, discs not visualized.  Oral cavity is clear. No oral mucosal lesions are identified. Neck is clear without evidence of cervical or supraclavicular adenopathy. Lungs are clear to A&P. Cardiac examination is essentially unremarkable with regular rate and rhythm without murmur rub or thrill. Abdomen is benign with no organomegaly or masses noted. Motor sensory and DTR levels are equal and symmetric in the upper and lower extremities. Cranial nerves II through XII are grossly intact. Proprioception is intact. No peripheral adenopathy or edema is identified. No motor or sensory levels are noted. Crude visual fields are within normal range.  LABORATORY DATA: pathology reports reviewed    RADIOLOGY RESULTS:ultrasound guidance used for volume study   IMPRESSION: adenocarcinoma the prostate in 42 year old male for I-125 interstitial implant.  PLAN: patient underwent 5 study today which is tolerated successfully. He is planned in 1 month for I-125 interstitial implant. Risks and benefits of that procedure were reviewed with the patient radiation safety  precautions were reviewed with the patient. Patient signed consent. Patient is to call with any concerns at any time prior to the procedure. He is not on aspirin.  I would like to take this opportunity to thank you for allowing me to participate in the care of your patient.Noreene Filbert, MD

## 2017-11-09 DIAGNOSIS — C61 Malignant neoplasm of prostate: Secondary | ICD-10-CM | POA: Diagnosis not present

## 2017-11-24 ENCOUNTER — Other Ambulatory Visit: Payer: Self-pay | Admitting: Internal Medicine

## 2017-11-29 ENCOUNTER — Other Ambulatory Visit: Payer: Self-pay

## 2017-11-29 ENCOUNTER — Encounter
Admission: RE | Admit: 2017-11-29 | Discharge: 2017-11-29 | Disposition: A | Discharge status: Home / Self Care | Payer: 59 | Source: Ambulatory Visit | Attending: Urology | Admitting: Urology

## 2017-11-29 DIAGNOSIS — E119 Type 2 diabetes mellitus without complications: Secondary | ICD-10-CM | POA: Insufficient documentation

## 2017-11-29 DIAGNOSIS — Z01812 Encounter for preprocedural laboratory examination: Secondary | ICD-10-CM | POA: Insufficient documentation

## 2017-11-29 DIAGNOSIS — Z01818 Encounter for other preprocedural examination: Secondary | ICD-10-CM | POA: Insufficient documentation

## 2017-11-29 DIAGNOSIS — I10 Essential (primary) hypertension: Secondary | ICD-10-CM | POA: Insufficient documentation

## 2017-11-29 HISTORY — DX: Sleep apnea, unspecified: G47.30

## 2017-11-29 HISTORY — DX: Personal history of urinary calculi: Z87.442

## 2017-11-29 LAB — BASIC METABOLIC PANEL
Anion gap: 4 — ABNORMAL LOW (ref 5–15)
BUN: 46 mg/dL — AB (ref 6–20)
CHLORIDE: 113 mmol/L — AB (ref 101–111)
CO2: 25 mmol/L (ref 22–32)
CREATININE: 2.68 mg/dL — AB (ref 0.61–1.24)
Calcium: 9.1 mg/dL (ref 8.9–10.3)
GFR calc Af Amer: 32 mL/min — ABNORMAL LOW (ref >60)
GFR calc non Af Amer: 27 mL/min — ABNORMAL LOW (ref >60)
Glucose, Bld: 78 mg/dL (ref 65–99)
Potassium: 4.7 mmol/L (ref 3.5–5.1)
Sodium: 142 mmol/L (ref 135–145)

## 2017-11-29 LAB — CBC
HEMATOCRIT: 36.8 % — AB (ref 40.0–52.0)
HEMOGLOBIN: 12.1 g/dL — AB (ref 13.0–18.0)
MCH: 29.3 pg (ref 26.0–34.0)
MCHC: 32.9 g/dL (ref 32.0–36.0)
MCV: 89.1 fL (ref 80.0–100.0)
Platelets: 337 10*3/uL (ref 150–440)
RBC: 4.14 MIL/uL — ABNORMAL LOW (ref 4.40–5.90)
RDW: 14.3 % (ref 11.5–14.5)
WBC: 9 10*3/uL (ref 3.8–10.6)

## 2017-11-29 NOTE — Patient Instructions (Addendum)
  Your procedure is scheduled on: Monday December 06, 2017 Report to Same Day Surgery 2nd floor medical mall ([place] Entrance-take elevator on left to 2nd floor.  Check in with surgery information desk.) To find out your arrival time please call [phone] between 1PM - 3PM on Friday December 03, 2017  Remember: Instructions that are not followed completely may result in serious medical risk, up to and including death, or upon the discretion of your surgeon and anesthesiologist your surgery may need to be rescheduled.    _x___ 1. Do not eat food after midnight the night before your procedure. You may drink water up to 2 hours before you are scheduled to arrive at the hospital for your procedure.  Do not drink anything within 2 hours of your scheduled arrival to the hospital.   No gum chewing or hard candies.      __x__ 2. No Alcohol for 24 hours before or after surgery.   __x__3. No Smoking or e-cigarettes for 24 prior to surgery.  Do not use any chewable tobacco products for at least 6 hour prior to surgery   ____  4. Bring all medications with you on the day of surgery if instructed.    __x__ 5. Notify your doctor if there is any change in your medical condition     (cold, fever, infections).   __x__6. On the morning of surgery brush your teeth with toothpaste and water.  You may rinse your mouth with mouth wash if you wish.  Do not swallow any toothpaste or mouthwash.   Do not wear jewelry.  Do not wear lotions, powders, deodorant, or perfumes.   Do not shave 48 hours prior to surgery. Men can shave their face and neck.  Do not bring valuables to the hospital.    [facility] is not responsible for any belongings or valuables.               Contacts, dentures or bridgework may not be worn into surgery.  Leave your suitcase in the car. After surgery it may be brought to your room.  For patients admitted to the hospital, discharge time is determined by your treatment team.  Please  read over the following fact sheets that you were given:   [facility] Preparing for Surgery and or MRSA Information   _x___ Take anti-hypertensive listed below, cardiac, seizure, asthma, anti-reflux and psychiatric medicines. These include:  1. Amlodipine/Norvasc  2. Atorvastatin/Lipitor  3. Carvedilol/Coreg  4. Hydralazine/Apresoline  5. Colchicine if needed Do not take Olmesartan/Benicar on day of surgery  _x___Fleets enema or Magnesium Citrate as directed.   _x___ Use CHG Soap or sage wipes as directed on instruction sheet   _x___ Stop Metformin and Janumet 2 days prior to surgery, Saturday, December 04, 2017 .    _x___ Follow recommendations from Cardiologist, Pulmonologist or PCP regarding stopping Aspirin, Coumadin, Plavix, Eliquis, Effient, or Pradaxa, and Pletal.  _x___Stop Anti-inflammatories such as Advil, Aleve, Ibuprofen, Motrin, Naproxen, Naprosyn, Goodies powders or aspirin products. OK to take Tylenol and Celebrex.   _x___ Stop supplements until after surgery.  But may continue Vitamin D, Vitamin B, and multivitamin.

## 2017-12-05 MED ORDER — CIPROFLOXACIN IN D5W 400 MG/200ML IV SOLN
400.0000 mg | INTRAVENOUS | Status: AC
  Administered 2017-12-06: 400 mg via INTRAVENOUS

## 2017-12-06 ENCOUNTER — Ambulatory Visit
Admission: RE | Admit: 2017-12-06 | Discharge: 2017-12-06 | Disposition: A | Discharge status: Home / Self Care | Payer: 59 | Source: Ambulatory Visit | Attending: Radiation Oncology | Admitting: Radiation Oncology

## 2017-12-06 ENCOUNTER — Ambulatory Visit
Admission: RE | Admit: 2017-12-06 | Discharge: 2017-12-06 | Disposition: A | Discharge status: Home / Self Care | Payer: 59 | Source: Ambulatory Visit | Attending: Urology | Admitting: Urology

## 2017-12-06 ENCOUNTER — Encounter
Admission: RE | Disposition: A | Discharge status: Home / Self Care | Payer: Self-pay | Source: Ambulatory Visit | Attending: Urology

## 2017-12-06 ENCOUNTER — Ambulatory Visit: Payer: 59 | Admitting: Certified Registered"

## 2017-12-06 ENCOUNTER — Other Ambulatory Visit: Payer: Self-pay | Admitting: Urology

## 2017-12-06 DIAGNOSIS — Z7984 Long term (current) use of oral hypoglycemic drugs: Secondary | ICD-10-CM | POA: Insufficient documentation

## 2017-12-06 DIAGNOSIS — C61 Malignant neoplasm of prostate: Secondary | ICD-10-CM

## 2017-12-06 DIAGNOSIS — Z6841 Body Mass Index (BMI) 40.0 and over, adult: Secondary | ICD-10-CM | POA: Insufficient documentation

## 2017-12-06 DIAGNOSIS — I10 Essential (primary) hypertension: Secondary | ICD-10-CM | POA: Diagnosis not present

## 2017-12-06 DIAGNOSIS — Z79899 Other long term (current) drug therapy: Secondary | ICD-10-CM | POA: Insufficient documentation

## 2017-12-06 DIAGNOSIS — E119 Type 2 diabetes mellitus without complications: Secondary | ICD-10-CM | POA: Insufficient documentation

## 2017-12-06 DIAGNOSIS — I129 Hypertensive chronic kidney disease with stage 1 through stage 4 chronic kidney disease, or unspecified chronic kidney disease: Secondary | ICD-10-CM | POA: Diagnosis not present

## 2017-12-06 DIAGNOSIS — E1122 Type 2 diabetes mellitus with diabetic chronic kidney disease: Secondary | ICD-10-CM | POA: Diagnosis not present

## 2017-12-06 HISTORY — PX: RADIOACTIVE SEED IMPLANT: SHX5150

## 2017-12-06 LAB — GLUCOSE, CAPILLARY
GLUCOSE-CAPILLARY: 98 mg/dL (ref 65–99)
Glucose-Capillary: 124 mg/dL — ABNORMAL HIGH (ref 65–99)

## 2017-12-06 SURGERY — INSERTION, RADIATION SOURCE, PROSTATE
Anesthesia: General | Site: Prostate | Wound class: Clean Contaminated

## 2017-12-06 MED ORDER — DEXAMETHASONE SODIUM PHOSPHATE 10 MG/ML IJ SOLN
INTRAMUSCULAR | Status: DC | PRN
  Administered 2017-12-06: 10 mg via INTRAVENOUS

## 2017-12-06 MED ORDER — FENTANYL CITRATE (PF) 100 MCG/2ML IJ SOLN
INTRAMUSCULAR | Status: AC
  Filled 2017-12-06: qty 2

## 2017-12-06 MED ORDER — TAMSULOSIN HCL 0.4 MG PO CAPS: 0.4000 mg | ORAL_CAPSULE | Freq: Every day | ORAL | 0 refills | Status: DC

## 2017-12-06 MED ORDER — CIPROFLOXACIN HCL 500 MG PO TABS: 500.0000 mg | ORAL_TABLET | Freq: Two times a day (BID) | ORAL | 0 refills | Status: DC

## 2017-12-06 MED ORDER — SODIUM CHLORIDE 0.9 % IV SOLN
INTRAVENOUS | Status: DC
  Administered 2017-12-06: 07:00:00 via INTRAVENOUS

## 2017-12-06 MED ORDER — MIDAZOLAM HCL 2 MG/2ML IJ SOLN
INTRAMUSCULAR | Status: DC | PRN
  Administered 2017-12-06: 2 mg via INTRAVENOUS

## 2017-12-06 MED ORDER — ROCURONIUM 10MG/ML (10ML) SYRINGE FOR MEDFUSION PUMP - OPTIME
INTRAVENOUS | Status: DC | PRN
  Administered 2017-12-06: 50 mg via INTRAVENOUS

## 2017-12-06 MED ORDER — PROMETHAZINE HCL 25 MG/ML IJ SOLN: 6.2500 mg | INTRAMUSCULAR | Status: DC | PRN

## 2017-12-06 MED ORDER — CIPROFLOXACIN IN D5W 400 MG/200ML IV SOLN
INTRAVENOUS | Status: AC
  Filled 2017-12-06: qty 200

## 2017-12-06 MED ORDER — BACITRACIN 500 UNIT/GM OP OINT
TOPICAL_OINTMENT | OPHTHALMIC | Status: AC
  Filled 2017-12-06: qty 3.5

## 2017-12-06 MED ORDER — FAMOTIDINE 20 MG PO TABS
20.0000 mg | ORAL_TABLET | Freq: Once | ORAL | Status: AC
  Administered 2017-12-06: 20 mg via ORAL

## 2017-12-06 MED ORDER — ONDANSETRON HCL 4 MG/2ML IJ SOLN
INTRAMUSCULAR | Status: DC | PRN
  Administered 2017-12-06: 4 mg via INTRAVENOUS

## 2017-12-06 MED ORDER — FAMOTIDINE 20 MG PO TABS
ORAL_TABLET | ORAL | Status: AC
  Administered 2017-12-06: 20 mg via ORAL
  Filled 2017-12-06: qty 1

## 2017-12-06 MED ORDER — DOCUSATE SODIUM 100 MG PO CAPS: 100.0000 mg | ORAL_CAPSULE | Freq: Two times a day (BID) | ORAL | 0 refills | Status: DC

## 2017-12-06 MED ORDER — FENTANYL CITRATE (PF) 100 MCG/2ML IJ SOLN
INTRAMUSCULAR | Status: DC | PRN
  Administered 2017-12-06 (×2): 50 ug via INTRAVENOUS

## 2017-12-06 MED ORDER — MIDAZOLAM HCL 2 MG/2ML IJ SOLN
INTRAMUSCULAR | Status: AC
  Filled 2017-12-06: qty 2

## 2017-12-06 MED ORDER — PHENYLEPHRINE HCL 10 MG/ML IJ SOLN
INTRAMUSCULAR | Status: DC | PRN
  Administered 2017-12-06 (×3): 200 ug via INTRAVENOUS

## 2017-12-06 MED ORDER — FENTANYL CITRATE (PF) 100 MCG/2ML IJ SOLN: 25.0000 ug | INTRAMUSCULAR | Status: DC | PRN

## 2017-12-06 MED ORDER — SUGAMMADEX SODIUM 200 MG/2ML IV SOLN
INTRAVENOUS | Status: DC | PRN
  Administered 2017-12-06: 200 mg via INTRAVENOUS

## 2017-12-06 MED ORDER — FLEET ENEMA 7-19 GM/118ML RE ENEM: 1.0000 | ENEMA | Freq: Once | RECTAL | Status: DC

## 2017-12-06 MED ORDER — BACITRACIN 500 UNIT/GM EX OINT
TOPICAL_OINTMENT | CUTANEOUS | Status: DC | PRN
  Administered 2017-12-06: 1 via TOPICAL

## 2017-12-06 MED ORDER — LIDOCAINE HCL (CARDIAC) PF 100 MG/5ML IV SOSY
PREFILLED_SYRINGE | INTRAVENOUS | Status: DC | PRN
  Administered 2017-12-06: 100 mg via INTRAVENOUS

## 2017-12-06 MED ORDER — LACTATED RINGERS IV SOLN
INTRAVENOUS | Status: DC | PRN
  Administered 2017-12-06: 08:00:00 via INTRAVENOUS

## 2017-12-06 MED ORDER — PROPOFOL 10 MG/ML IV BOLUS
INTRAVENOUS | Status: AC
  Filled 2017-12-06: qty 20

## 2017-12-06 MED ORDER — EPHEDRINE SULFATE 50 MG/ML IJ SOLN
INTRAMUSCULAR | Status: DC | PRN
  Administered 2017-12-06 (×3): 10 mg via INTRAVENOUS
  Administered 2017-12-06: 20 mg via INTRAVENOUS

## 2017-12-06 MED ORDER — HYDROCODONE-ACETAMINOPHEN 5-325 MG PO TABS: 1.0000 | ORAL_TABLET | Freq: Four times a day (QID) | ORAL | 0 refills | Status: DC | PRN

## 2017-12-06 MED ORDER — BACITRACIN ZINC 500 UNIT/GM EX OINT
TOPICAL_OINTMENT | CUTANEOUS | Status: AC
  Filled 2017-12-06: qty 28.35

## 2017-12-06 MED ORDER — PROPOFOL 10 MG/ML IV BOLUS
INTRAVENOUS | Status: DC | PRN
  Administered 2017-12-06: 200 mg via INTRAVENOUS

## 2017-12-06 SURGICAL SUPPLY — 24 items
BAG URINE DRAINAGE (UROLOGICAL SUPPLIES) ×2 IMPLANT
BLADE CLIPPER SURG (BLADE) IMPLANT
CATH FOL 2WAY LX 16X5 (CATHETERS) ×2 IMPLANT
DRAPE INCISE 23X17 IOBAN STRL (DRAPES) ×1
DRAPE INCISE IOBAN 23X17 STRL (DRAPES) ×1 IMPLANT
DRAPE SHEET LG 3/4 BI-LAMINATE (DRAPES) ×2 IMPLANT
DRAPE TABLE BACK 80X90 (DRAPES) ×2 IMPLANT
DRAPE UNDER BUTTOCK W/FLU (DRAPES) ×2 IMPLANT
DRSG TELFA 3X8 NADH (GAUZE/BANDAGES/DRESSINGS) ×2 IMPLANT
GLOVE BIO SURGEON STRL SZ 6.5 (GLOVE) ×4 IMPLANT
GLOVE BIO SURGEON STRL SZ7.5 (GLOVE) ×4 IMPLANT
GOWN STRL REUS W/ TWL LRG LVL3 (GOWN DISPOSABLE) ×2 IMPLANT
GOWN STRL REUS W/ TWL XL LVL3 (GOWN DISPOSABLE) ×1 IMPLANT
GOWN STRL REUS W/TWL LRG LVL3 (GOWN DISPOSABLE) ×2
GOWN STRL REUS W/TWL XL LVL3 (GOWN DISPOSABLE) ×1
IV NS 1000ML (IV SOLUTION) ×1
IV NS 1000ML BAXH (IV SOLUTION) ×1 IMPLANT
KIT TURNOVER CYSTO (KITS) ×2 IMPLANT
PACK CYSTO AR (MISCELLANEOUS) ×2 IMPLANT
SET CYSTO W/LG BORE CLAMP LF (SET/KITS/TRAYS/PACK) ×2 IMPLANT
SURGILUBE 2OZ TUBE FLIPTOP (MISCELLANEOUS) ×2 IMPLANT
SYR 10ML LL (SYRINGE) ×2 IMPLANT
SYRINGE IRR TOOMEY STRL 70CC (SYRINGE) ×2 IMPLANT
WATER STERILE IRR 1000ML POUR (IV SOLUTION) ×2 IMPLANT

## 2017-12-06 NOTE — Interval H&P Note (Signed)
History and Physical Interval Note:  12/06/2017 7:30 AM  Benjamin Stewart  has presented today for surgery, with the diagnosis of prostate cancer  The various methods of treatment have been discussed with the patient and family. After consideration of risks, benefits and other options for treatment, the patient has consented to  Procedure(s): RADIOACTIVE SEED IMPLANT/BRACHYTHERAPY IMPLANT (N/A) as a surgical intervention .  The patient's history has been reviewed, patient examined, no change in status, stable for surgery.  I have reviewed the patient's chart and labs.  Questions were answered to the patient's satisfaction.    RRR CTAB  Hollice Espy

## 2017-12-06 NOTE — Anesthesia Post-op Follow-up Note (Signed)
Anesthesia QCDR form completed.        

## 2017-12-06 NOTE — Anesthesia Postprocedure Evaluation (Signed)
Anesthesia Post Note  Patient: Benjamin Stewart  Procedure(s) Performed: RADIOACTIVE SEED IMPLANT/BRACHYTHERAPY IMPLANT (N/A Prostate)  Patient location during evaluation: PACU Anesthesia Type: General Level of consciousness: awake and alert Pain management: pain level controlled Vital Signs Assessment: post-procedure vital signs reviewed and stable Respiratory status: spontaneous breathing, nonlabored ventilation, respiratory function stable and patient connected to nasal cannula oxygen Cardiovascular status: blood pressure returned to baseline and stable Postop Assessment: no apparent nausea or vomiting Anesthetic complications: no     Last Vitals:  Vitals:   12/06/17 1010 12/06/17 1045  BP: 126/65 (!) 142/78  Pulse: 68 73  Resp: 16 16  Temp: 36.8 C   SpO2: 100% 100%    Last Pain:  Vitals:   12/06/17 1045  TempSrc:   PainSc: 0-No pain                 Martha Clan

## 2017-12-06 NOTE — Discharge Instructions (Addendum)
Brachytherapy for Prostate Cancer, Care After Refer to this sheet in the next few weeks. These instructions provide you with information on caring for yourself after your procedure. Your health care provider may also give you more specific instructions. Your treatment has been planned according to current medical practices, but problems sometimes occur. Call your health care provider if you have any problems or questions after your procedure. What can I expect after the procedure? The area behind the scrotum will probably be tender and bruised. For a short period of time you may have:  Difficulty passing urine. You may need a catheter for a few days to a month.  Blood in the urine or semen.  A feeling of constipation because of prostate swelling.  Frequent feeling of an urgent need to urinate.  For a long period of time you may have:  Inflammation of the rectum. This happens in about 2% of people who have the procedure.  Erection problems. These vary with age and occur in about 15-40% of men.  Difficulty urinating. This is caused by scarring in the urethra.  Diarrhea.  Follow these instructions at home:  Take medicines only as directed by your health care provider.  You will probably have a catheter in your bladder for several days. You will have blood in the urine bag and should drink a lot of fluids to keep it a light red color.  Keep all follow-up visits as directed by your health care provider. If you have a catheter, it will be removed during one of these visits.  Try not to sit directly on the area behind the scrotum. A soft cushion can decrease the discomfort. Ice packs may also be helpful for the discomfort. Do not put ice directly on the skin.  Shower and wash the area behind the scrotum gently. Do not sit in a tub.  If you have had the brachytherapy that uses the seeds, limit your close contact with children and pregnant women for 2 months because of the radiation still  in the prostate. After that period of time, the levels drop off quickly. Get help right away if:  You have a fever.  You have chills.  You have shortness of breath.  You have chest pain.  You have thick blood, like tomato juice, in the urine bag.  Your catheter is blocked so urine cannot get into the bag. Your bladder area or lower abdomen may be swollen.  There is excessive bleeding from your rectum. It is normal to have a little blood mixed with your stool.  There is severe discomfort in the treated area that does not go away with pain medicine.  You have abdominal discomfort.  You have severe nausea or vomiting.  You develop any new or unusual symptoms. This information is not intended to replace advice given to you by your health care provider. Make sure you discuss any questions you have with your health care provider. Document Released: 08/29/2010 Document Revised: 01/08/2016 Document Reviewed: 01/17/2013 Elsevier Interactive Patient Education  2017 [place]   1) The drugs that you were given will stay in your system until tomorrow so for the next 24 hours you should not:  A) Drive an automobile B) Make any legal decisions C) Drink any alcoholic beverage   2) You may resume regular meals tomorrow.  Today it is better to start with liquids and gradually work up to solid foods.  You may eat anything  you prefer, but it is better to start with liquids, then soup and crackers, and gradually work up to solid foods.   3) Please notify your doctor immediately if you have any unusual bleeding, trouble breathing, redness and pain at the surgery site, drainage, fever, or pain not relieved by medication.    4) Additional Instructions:        Please contact your physician with any problems or Same Day Surgery at [phone], Monday through Friday 6 am to 4 pm, or [place] at [facility] number at  [phone].

## 2017-12-06 NOTE — Transfer of Care (Signed)
Immediate Anesthesia Transfer of Care Note  Patient: Benjamin Stewart  Procedure(s) Performed: RADIOACTIVE SEED IMPLANT/BRACHYTHERAPY IMPLANT (N/A Prostate)  Patient Location: PACU  Anesthesia Type:General  Level of Consciousness: awake  Airway & Oxygen Therapy: Patient Spontanous Breathing and Patient connected to face mask oxygen  Post-op Assessment: Report given to RN  Post vital signs: Reviewed and stable  Last Vitals:  Vitals Value Taken Time  BP    Temp    Pulse 75 12/06/2017  9:01 AM  Resp 14 12/06/2017  9:01 AM  SpO2 100 % 12/06/2017  9:01 AM  Vitals shown include unvalidated device data.  Last Pain:  Vitals:   12/06/17 0619  TempSrc: Oral  PainSc: 0-No pain         Complications: No apparent anesthesia complications

## 2017-12-06 NOTE — Progress Notes (Signed)
Radiation Oncology Follow up Note  Name: Benjamin Stewart   Date:   09/30/2017 MRN:  [id number] DOB: 06-Feb-1976    This 42 y.o. male presents to the clinic today for I-125 interstitial implant.or stage IIa adenocarcinoma the prostate  REFERRING PROVIDER: No ref. provider found  HPI: patient is a 42 year old male seen today for I-125 interstitial implant for.Gleason 7 (3+4) adenocarcinoma the prostate presenting the PSA of 8.4.he was thought to be morbidly obese for robotic prostatectomy and his other multiple comorbidities including adult-onset diabetes hypertension.Patient has had a volume study  in anticipation of today's procedure. COMPLICATIONS OF TREATMENT: none  FOLLOW UP COMPLIANCE: keeps appointments   PHYSICAL EXAM:  BP (!) 142/78   Pulse 73   Temp 98.3 F (36.8 C) (Oral)   Resp 16   SpO2 100%  Well-developed well-nourished patient in NAD. HEENT reveals PERLA, EOMI, discs not visualized.  Oral cavity is clear. No oral mucosal lesions are identified. Neck is clear without evidence of cervical or supraclavicular adenopathy. Lungs are clear to A&P. Cardiac examination is essentially unremarkable with regular rate and rhythm without murmur rub or thrill. Abdomen is benign with no organomegaly or masses noted. Motor sensory and DTR levels are equal and symmetric in the upper and lower extremities. Cranial nerves II through XII are grossly intact. Proprioception is intact. No peripheral adenopathy or edema is identified. No motor or sensory levels are noted. Crude visual fields are within normal range.  RADIOLOGY RESULTS: ultrasound and plain film were used today for both seem placement as well as seed  PLAN: patient was taken to the operating room and general anesthesia was administered. Legs were immobilized in stirrups and patient was positioned in the exact same proportions as original volume study. Patient was prepped and Foley catheter was placed. Ultrasound guidance identified the  prostate and recreated the original set up as per treatment planning volume study. 26 needles were placed under ultrasound guidance with PVCs delivered to the prostate volume. After completion of procedure cystoscopy was performed by urology and no evidence of seeds in the bladder were noted. Patient tolerated the procedure extremely well. Initial plain film as doublecheck identified 79 seeds in the prostate. Patient has followup appointment in one month for CT scan for quality assurance will be performed.     Noreene Filbert, MD

## 2017-12-06 NOTE — Op Note (Signed)
12/06/17  Preoperative diagnosis: Adenocarcinoma of the prostate   Postoperative diagnosis: Same   Procedure: I-125 prostate seed implantation, cystoscopy  Surgeon: Hollice Espy M.D. ,   Radiation Oncology: Lavena Stanford, M.D.   Anesthesia: General  Drains: none  Complications: none  Indications: Intermediate risk prostate cancer  Procedure: Patient was brought to operating suite and placement table in the supine position. At this time, a universal timeout protocol was performed, all team members were identified, Venodyne boots are placed, and he was administered IV Ancef in the preoperative period. He was placed in lithotomy position and prepped and draped in usual manner. Radiation oncology department placed a transrectal ultrasound probe anchoring stand/ grid and aligned with previous imaging from the volume study. Foley catheter was inserted without difficulty.  All needle passage was done with real-time transrectal ultrasound guidance in both the transverse and sagittal plains in order to achieve the desired preplanned position. A total of 24 needles were placed.  79 active seeds were implanted. The Foley catheter was removed and a rigid cystoscopy failed to show any seeds outside the prostate without evidence of trauma to the urethral, prostatic fossa, or bladder.  The bladder was drained.  A fluoroscopic image was then obtained showing excellent distrubution of the brachytherapy seeds.  Each seed was counted and counts were correct.    The patient was then repositioned in the supine position, reversed from anesthesia, and taken to the PACU in stable condition.

## 2017-12-06 NOTE — Anesthesia Procedure Notes (Signed)
Procedure Name: Intubation Performed by: Philbert Riser, CRNA Pre-anesthesia Checklist: Patient identified, Emergency Drugs available, Suction available, Timeout performed and Patient being monitored Patient Re-evaluated:Patient Re-evaluated prior to induction Oxygen Delivery Method: Circle system utilized and Simple face mask Preoxygenation: Pre-oxygenation with 100% oxygen Induction Type: IV induction Ventilation: Mask ventilation with difficulty Laryngoscope Size: McGraph and 4 Grade View: Grade II Tube type: Oral Tube size: 7.5 mm Number of attempts: 1 Airway Equipment and Method: Stylet Placement Confirmation: ETT inserted through vocal cords under direct vision Secured at: 23 cm Tube secured with: Tape

## 2017-12-06 NOTE — Anesthesia Preprocedure Evaluation (Signed)
Anesthesia Evaluation  Patient identified by MRN, date of birth, ID band Patient awake    Reviewed: Allergy & Precautions, H&P , NPO status , Patient's Chart, lab work & pertinent test results, reviewed documented beta blocker date and time   History of Anesthesia Complications Negative for: history of anesthetic complications  Airway Mallampati: IV  TM Distance: >3 FB Neck ROM: full    Dental  (+) Dental Advidsory Given, Teeth Intact   Pulmonary neg shortness of breath, sleep apnea , neg COPD, neg recent URI,           Cardiovascular Exercise Tolerance: Good hypertension, (-) angina(-) CAD, (-) Past MI, (-) Cardiac Stents and (-) CABG (-) dysrhythmias (-) Valvular Problems/Murmurs     Neuro/Psych negative neurological ROS  negative psych ROS   GI/Hepatic negative GI ROS, Neg liver ROS,   Endo/Other  diabetes, Oral Hypoglycemic AgentsMorbid obesity  Renal/GU Renal disease (kidney stones)  negative genitourinary   Musculoskeletal   Abdominal   Peds  Hematology negative hematology ROS (+)   Anesthesia Other Findings Past Medical History: 12/04/2014: Benign essential hematuria     Comment:  microscopic hematuria evaluation with CT and cysto -               treated for 2 months with bactrim DS; no further f/u               planned unless hematuria recurs.  07/16/2015: Controlled type 2 diabetes mellitus without complication,  without long-term current use of insulin ([place]) 12/04/2014: Essential (primary) hypertension No date: History of kidney stones No date: prostate cancer No date: Sleep apnea   Reproductive/Obstetrics negative OB ROS                             Anesthesia Physical Anesthesia Plan  ASA: III  Anesthesia Plan: General   Post-op Pain Management:    Induction: Intravenous  PONV Risk Score and Plan: 2 and Ondansetron and Dexamethasone  Airway Management Planned: Oral  ETT  Additional Equipment:   Intra-op Plan:   Post-operative Plan: Extubation in OR  Informed Consent: I have reviewed the patients History and Physical, chart, labs and discussed the procedure including the risks, benefits and alternatives for the proposed anesthesia with the patient or authorized representative who has indicated his/her understanding and acceptance.   Dental Advisory Given  Plan Discussed with: Anesthesiologist, CRNA and Surgeon  Anesthesia Plan Comments:         Anesthesia Quick Evaluation

## 2017-12-07 ENCOUNTER — Encounter: Payer: Self-pay | Admitting: Urology

## 2017-12-13 ENCOUNTER — Encounter: Payer: Self-pay | Admitting: Internal Medicine

## 2017-12-17 ENCOUNTER — Ambulatory Visit (INDEPENDENT_AMBULATORY_CARE_PROVIDER_SITE_OTHER): Payer: 59 | Admitting: Internal Medicine

## 2017-12-17 ENCOUNTER — Encounter: Payer: Self-pay | Admitting: Internal Medicine

## 2017-12-17 VITALS — BP 132/86 | HR 76 | Resp 16 | Ht 72.0 in | Wt 368.0 lb

## 2017-12-17 DIAGNOSIS — I129 Hypertensive chronic kidney disease with stage 1 through stage 4 chronic kidney disease, or unspecified chronic kidney disease: Secondary | ICD-10-CM

## 2017-12-17 DIAGNOSIS — E1122 Type 2 diabetes mellitus with diabetic chronic kidney disease: Secondary | ICD-10-CM | POA: Diagnosis not present

## 2017-12-17 DIAGNOSIS — N183 Chronic kidney disease, stage 3 (moderate): Secondary | ICD-10-CM | POA: Diagnosis not present

## 2017-12-17 DIAGNOSIS — I10 Essential (primary) hypertension: Secondary | ICD-10-CM

## 2017-12-17 DIAGNOSIS — E785 Hyperlipidemia, unspecified: Secondary | ICD-10-CM

## 2017-12-17 DIAGNOSIS — C61 Malignant neoplasm of prostate: Secondary | ICD-10-CM | POA: Diagnosis not present

## 2017-12-17 DIAGNOSIS — E1169 Type 2 diabetes mellitus with other specified complication: Secondary | ICD-10-CM | POA: Diagnosis not present

## 2017-12-17 NOTE — Progress Notes (Signed)
Date:  12/17/2017   Name:  Benjamin Stewart   DOB:  May 07, 1976   MRN:  [id number]   Chief Complaint: Hypertension (Had radiation seed implant ) Hypertension  This is a chronic problem. The problem is controlled. Pertinent negatives include no chest pain, headaches, palpitations or shortness of breath. Past treatments include calcium channel blockers, beta blockers, angiotensin blockers, ACE inhibitors and direct vasodilators. The current treatment provides significant improvement.  Diabetes  He presents for his follow-up diabetic visit. He has type 2 diabetes mellitus. His disease course has been stable. Pertinent negatives for hypoglycemia include no dizziness or headaches. Pertinent negatives for diabetes include no chest pain. Current diabetic treatment includes oral agent (dual therapy). He is compliant with treatment most of the time. Weight trend: has lost some with cutting back on food intake.  Prostate cancer - has radioactive seed implants 2 weeks ago.  Now on stool softener and tamsulosin.  He feels well. Renal Insuff - followed by Dr. Holley Raring.  His latest values are stable.  His A1C was very low last time - would consider stopping metformin or reducing the dose.  Lab Results  Component Value Date   HGBA1C 5.1 08/19/2017   Lab Results  Component Value Date   CREATININE 2.68 (H) 11/29/2017   BUN 46 (H) 11/29/2017   NA 142 11/29/2017   K 4.7 11/29/2017   CL 113 (H) 11/29/2017   CO2 25 11/29/2017   Lab Results  Component Value Date   CHOL 129 08/19/2017   HDL 36 (L) 08/19/2017   LDLCALC 80 08/19/2017   TRIG 64 08/19/2017   CHOLHDL 3.6 08/19/2017      Review of Systems  Constitutional: Negative for chills, diaphoresis, fever and unexpected weight change.  HENT: Negative for trouble swallowing.   Respiratory: Negative for cough, chest tightness, shortness of breath and wheezing.   Cardiovascular: Negative for chest pain and palpitations.  Genitourinary: Negative for  decreased urine volume, difficulty urinating and dysuria.  Musculoskeletal: Negative for arthralgias.  Neurological: Negative for dizziness, numbness and headaches.  Psychiatric/Behavioral: Negative for dysphoric mood and sleep disturbance.    Patient Active Problem List   Diagnosis Date Noted  . 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 Prostate cancer ([place]) 08/23/2015  . Type 2 DM with CKD stage 3 and hypertension ([place]) 07/16/2015  . Hyperlipidemia associated with type 2 diabetes mellitus ([place]) 07/16/2015  . Chronic gout 12/04/2014  . Benign essential hematuria 12/04/2014  . Essential (primary) hypertension 12/04/2014  . Morbid obesity, unspecified obesity type ([place]) 12/04/2014    Prior to Admission medications   Medication Sig Start Date End Date Taking? Authorizing Provider  amLODipine (NORVASC) 10 MG tablet TAKE 1 TABLET BY MOUTH ONCE DAILY 10/29/17  Yes Glean Hess, MD  atorvastatin (LIPITOR) 10 MG tablet TAKE 1 TABLET BY MOUTH ONCE DAILY WITH BREAKFAST 10/29/17  Yes Glean Hess, MD  carvedilol (COREG) 6.25 MG tablet Take 6.25 mg by mouth 2 (two) times daily with a meal.  03/30/17  Yes [provider]  colchicine 0.6 MG tablet One twice a day for gout flare - up to 5 days Patient taking differently: Take 0.6 mg by mouth daily as needed. One twice a day for gout flare 04/01/17  Yes Glean Hess, MD  docusate sodium (COLACE) 100 MG capsule Take 1 capsule (100 mg total) by mouth 2 (two) times daily. 12/06/17  Yes Hollice Espy, MD  glimepiride (AMARYL) 2 MG tablet  TAKE 2 MG BY MOUTH ONCE DAILY WITH  BREAKFAST 11/24/17  Yes Glean Hess, MD  hydrALAZINE (APRESOLINE) 100 MG tablet Take 100 mg by mouth 3 (three) times daily.  03/29/17  Yes [provider]  metFORMIN (GLUCOPHAGE) 500 MG tablet TAKE 1 TABLET BY MOUTH TWICE DAILY WITH MEALS 11/24/17  Yes Glean Hess, MD  olmesartan (BENICAR) 40 MG tablet Take 1 tablet (40 mg  total) by mouth daily. 10/06/17  Yes Glean Hess, MD  quinapril (ACCUPRIL) 40 MG tablet Take by mouth. 05/02/04  Yes [provider]  tamsulosin (FLOMAX) 0.4 MG CAPS capsule TAKE ONE CAPSULE BY MOUTH DAILY 12/06/17  Yes McGowan, Larene [place] A, PA-C  HYDROcodone-acetaminophen (NORCO/VICODIN) 5-325 MG tablet Take 1-2 tablets by mouth every 6 (six) hours as needed for moderate pain. Patient not taking: Reported on 12/17/2017 12/06/17   Hollice Espy, MD    Allergies  Allergen Reactions  . Percocet [Oxycodone-Acetaminophen] Nausea And Vomiting    Past Surgical History:  Procedure Laterality Date  . PARTIAL COLECTOMY  2005  . RADIOACTIVE SEED IMPLANT N/A 12/06/2017   Procedure: RADIOACTIVE SEED IMPLANT/BRACHYTHERAPY IMPLANT;  Surgeon: Hollice Espy, MD;  Location: [facility] ORS;  Service: Urology;  Laterality: N/A;    Social History   Tobacco Use  . Smoking status: Never Smoker  . Smokeless tobacco: Never Used  Substance Use Topics  . Alcohol use: No    Alcohol/week: 0.0 oz  . Drug use: No     Medication list has been reviewed and updated.  PHQ 2/9 Scores 11/08/2017 09/30/2017 08/19/2017 08/18/2016  PHQ - 2 Score 0 0 0 0    Physical Exam  Constitutional: He is oriented to person, place, and time. He appears well-developed. No distress.  HENT:  Head: Normocephalic and atraumatic.  Eyes: Pupils are equal, round, and reactive to light.  Neck: Normal range of motion. Neck supple.  Cardiovascular: Normal rate, regular rhythm and normal heart sounds.  Pulmonary/Chest: Effort normal and breath sounds normal. No respiratory distress.  Musculoskeletal: Normal range of motion.  Neurological: He is alert and oriented to person, place, and time.  Skin: Skin is warm and dry. No rash noted.  Psychiatric: He has a normal mood and affect. His behavior is normal. Thought content normal.  Nursing note and vitals reviewed.   BP 132/86   Pulse 76   Resp 16   Ht 6' (1.829 m)   Wt (!) 368  lb (166.9 kg)   SpO2 97%   BMI 49.91 kg/m   Assessment and Plan: 1. Type 2 DM with CKD stage 3 and hypertension ([place]) Controlled - consider reducing dose of metformin - Hemoglobin A1c  2. Essential (primary) hypertension Controlled Renal function stable  3. Prostate cancer [facility]) Now being treated with radioactive seeds  4. Hyperlipidemia associated with type 2 diabetes mellitus ([place]) On statin therapy   No orders of the defined types were placed in this encounter.   Partially dictated using Editor, commissioning. Any errors are unintentional.  Halina Maidens, MD [facility]  12/17/2017

## 2017-12-18 LAB — HEMOGLOBIN A1C
Est. average glucose Bld gHb Est-mCnc: 94 mg/dL
Hgb A1c MFr Bld: 4.9 % (ref 4.8–5.6)

## 2018-01-05 ENCOUNTER — Encounter: Payer: Self-pay | Admitting: Radiation Oncology

## 2018-01-05 ENCOUNTER — Other Ambulatory Visit: Payer: Self-pay

## 2018-01-05 ENCOUNTER — Ambulatory Visit
Admission: RE | Admit: 2018-01-05 | Discharge: 2018-01-05 | Disposition: A | Discharge status: Home / Self Care | Payer: 59 | Source: Ambulatory Visit | Attending: Radiation Oncology | Admitting: Radiation Oncology

## 2018-01-05 VITALS — BP 151/87 | HR 84 | Temp 96.7°F | Resp 20 | Wt 373.5 lb

## 2018-01-05 DIAGNOSIS — Z510 Encounter for antineoplastic radiation therapy: Secondary | ICD-10-CM | POA: Diagnosis present

## 2018-01-05 DIAGNOSIS — C61 Malignant neoplasm of prostate: Secondary | ICD-10-CM

## 2018-01-05 DIAGNOSIS — Z6841 Body Mass Index (BMI) 40.0 and over, adult: Secondary | ICD-10-CM | POA: Insufficient documentation

## 2018-01-05 NOTE — Progress Notes (Signed)
Radiation Oncology Follow up Note  Name: Benjamin Stewart   Date:   01/05/2018 MRN:  [id number] DOB: 02/14/1976    This 42 y.o. male presents to the clinic today for one-month follow-up status post I-125 interstitial implant for stage IIa adenocarcinoma the prostate.  REFERRING PROVIDER: Glean Hess, MD  HPI: patient is a 42 year old male now seen out 1 month after completion of I-125 interstitial implant for Gleason 7 (3+4) adenocarcinoma the prostate presenting the PSA of 8.4. He was morbidly obese for robotic prostatectomy. He is doing well. He is actually having very little side effect profile. He is having no diarrhea no increased lower urinary tract symptoms. Would perform CT quality assurance CT scan showing excellent placement of sources..  COMPLICATIONS OF TREATMENT: none  FOLLOW UP COMPLIANCE: keeps appointments   PHYSICAL EXAM:  BP (!) 151/87   Pulse 84   Temp (!) 96.7 F (35.9 C)   Resp 20   Wt (!) 373 lb 7.4 oz (169.4 kg)   BMI 50.65 kg/m  On rectal exam rectal sphincter tone is good. Prostate is smooth contracted without evidence of nodularity or mass. Sulcus is preserved bilaterally. No discrete nodularity is identified. No other rectal abnormalities are noted. Well-developed well-nourished patient in NAD. HEENT reveals PERLA, EOMI, discs not visualized.  Oral cavity is clear. No oral mucosal lesions are identified. Neck is clear without evidence of cervical or supraclavicular adenopathy. Lungs are clear to A&P. Cardiac examination is essentially unremarkable with regular rate and rhythm without murmur rub or thrill. Abdomen is benign with no organomegaly or masses noted. Motor sensory and DTR levels are equal and symmetric in the upper and lower extremities. Cranial nerves II through XII are grossly intact. Proprioception is intact. No peripheral adenopathy or edema is identified. No motor or sensory levels are noted. Crude visual fields are within normal  range.  RADIOLOGY RESULTS: CT scan for quality assurance reviewed  PLAN: present time patient is doing well we will run full QA on his CT scan from today for source placement. He has very low side effect profile. I'm please was overall progress. I've asked to see him back in 3 months and will obtain a PSA prior to that visit. Patient knows to call sooner with any concerns.  I would like to take this opportunity to thank you for allowing me to participate in the care of your patient.Noreene Filbert, MD

## 2018-01-06 ENCOUNTER — Other Ambulatory Visit: Payer: Self-pay | Admitting: Urology

## 2018-01-06 DIAGNOSIS — Z510 Encounter for antineoplastic radiation therapy: Secondary | ICD-10-CM | POA: Diagnosis not present

## 2018-01-07 ENCOUNTER — Other Ambulatory Visit: Payer: Self-pay | Admitting: Urology

## 2018-01-10 DIAGNOSIS — E119 Type 2 diabetes mellitus without complications: Secondary | ICD-10-CM | POA: Diagnosis not present

## 2018-01-10 LAB — HM DIABETES EYE EXAM

## 2018-01-17 ENCOUNTER — Encounter: Payer: Self-pay | Admitting: Internal Medicine

## 2018-01-17 DIAGNOSIS — N184 Chronic kidney disease, stage 4 (severe): Secondary | ICD-10-CM | POA: Diagnosis not present

## 2018-01-17 DIAGNOSIS — N2581 Secondary hyperparathyroidism of renal origin: Secondary | ICD-10-CM | POA: Diagnosis not present

## 2018-01-17 DIAGNOSIS — E1122 Type 2 diabetes mellitus with diabetic chronic kidney disease: Secondary | ICD-10-CM | POA: Diagnosis not present

## 2018-01-17 DIAGNOSIS — I10 Essential (primary) hypertension: Secondary | ICD-10-CM | POA: Diagnosis not present

## 2018-01-19 ENCOUNTER — Ambulatory Visit: Admit: 2018-01-19 | Payer: 59

## 2018-01-19 DIAGNOSIS — C61 Malignant neoplasm of prostate: Secondary | ICD-10-CM | POA: Diagnosis not present

## 2018-01-20 ENCOUNTER — Encounter: Payer: Self-pay | Admitting: Urology

## 2018-01-20 ENCOUNTER — Ambulatory Visit (INDEPENDENT_AMBULATORY_CARE_PROVIDER_SITE_OTHER): Payer: 59 | Admitting: Urology

## 2018-01-20 VITALS — BP 147/83 | HR 75 | Ht 72.0 in | Wt 380.0 lb

## 2018-01-20 DIAGNOSIS — C61 Malignant neoplasm of prostate: Secondary | ICD-10-CM | POA: Diagnosis not present

## 2018-01-20 NOTE — Progress Notes (Signed)
01/20/2018 9:19 AM   Benjamin Stewart 03/30/1976 [id number]  Referring provider: Glean Hess, MD [address] [place], [place] [address]  Chief Complaint  Patient presents with  . Prostate Cancer    6wk    HPI: 42 year old I-125 brachytherapy prostate seed implant on 12/06/2016 returns today for routine follow-up.  His first diagnosed prostate cancer 2017, Gleason 3+3.  He had several follow-up biopsies after rise in his PSA to 8.4.  His most recent biopsy was in January 2019 which showed upstaging to Gleason 3+4 prostate cancer.  Please see previous note for details.  Postoperatively, he is doing fine.  He denies any urinary symptoms.  He is voiding spontaneously without difficulty.  No dysuria or hematuria.  He is taking Flomax and stools still.  He wonders to continue his medications.  PSA today pending.   PMH: Past Medical History:  Diagnosis Date  . Benign essential hematuria 12/04/2014   microscopic hematuria evaluation with CT and cysto - treated for 2 months with bactrim DS; no further f/u planned unless hematuria recurs.   . Controlled type 2 diabetes mellitus without complication, without long-term current use of insulin ([place]) 07/16/2015  . Essential (primary) hypertension 12/04/2014  . History of kidney stones   . prostate cancer   . Sleep apnea     Surgical History: Past Surgical History:  Procedure Laterality Date  . PARTIAL COLECTOMY  2005  . RADIOACTIVE SEED IMPLANT N/A 12/06/2017   Procedure: RADIOACTIVE SEED IMPLANT/BRACHYTHERAPY IMPLANT;  Surgeon: Hollice Espy, MD;  Location: [facility] ORS;  Service: Urology;  Laterality: N/A;    Home Medications:  Allergies as of 01/20/2018      Reactions   Percocet [oxycodone-acetaminophen] Nausea And Vomiting      Medication List        Accurate as of 01/20/18  9:19 AM. Always use your most recent med list.          ACCUPRIL 40 MG tablet Generic drug:  quinapril Take by mouth.   amLODipine  10 MG tablet Commonly known as:  NORVASC TAKE 1 TABLET BY MOUTH ONCE DAILY   atorvastatin 10 MG tablet Commonly known as:  LIPITOR TAKE 1 TABLET BY MOUTH ONCE DAILY WITH BREAKFAST   carvedilol 6.25 MG tablet Commonly known as:  COREG Take 6.25 mg by mouth 2 (two) times daily with a meal.   cloNIDine 0.2 mg/24hr patch Commonly known as:  CATAPRES - Dosed in mg/24 hr APP 1 PA EXT TO THE SKIN 1 TIME A WK   colchicine 0.6 MG tablet One twice a day for gout flare - up to 5 days   glimepiride 2 MG tablet Commonly known as:  AMARYL TAKE 2 MG BY MOUTH ONCE DAILY WITH  BREAKFAST   hydrALAZINE 100 MG tablet Commonly known as:  APRESOLINE Take 100 mg by mouth 3 (three) times daily.   metFORMIN 500 MG tablet Commonly known as:  GLUCOPHAGE TAKE 1 TABLET BY MOUTH TWICE DAILY WITH MEALS   olmesartan 40 MG tablet Commonly known as:  BENICAR Take 1 tablet (40 mg total) by mouth daily.       Allergies:  Allergies  Allergen Reactions  . Percocet [Oxycodone-Acetaminophen] Nausea And Vomiting    Family History: Family History  Problem Relation Age of Onset  . Diabetes Mother   . Heart disease Mother   . Hypertension Mother   . Diabetes Maternal Grandmother   . Diabetes Maternal Grandfather   . Prostate cancer Maternal Uncle  Social History:  reports that he has never smoked. He has never used smokeless tobacco. He reports that he does not drink alcohol or use drugs.  ROS: UROLOGY Frequent Urination?: No Hard to postpone urination?: No Burning/pain with urination?: No Get up at night to urinate?: Yes Leakage of urine?: No Urine stream starts and stops?: No Trouble starting stream?: No Do you have to strain to urinate?: No Blood in urine?: No Urinary tract infection?: No Sexually transmitted disease?: No Injury to kidneys or bladder?: No Painful intercourse?: No Weak stream?: No Erection problems?: No Penile pain?: No  Gastrointestinal Nausea?: No Vomiting?:  No Indigestion/heartburn?: No Diarrhea?: No Constipation?: No  Constitutional Fever: No Night sweats?: No Weight loss?: No Fatigue?: No  Skin Skin rash/lesions?: No Itching?: No  Eyes Blurred vision?: No Double vision?: No  Ears/Nose/Throat Sore throat?: No Sinus problems?: No  Hematologic/Lymphatic Swollen glands?: No Easy bruising?: No  Cardiovascular Leg swelling?: No Chest pain?: No  Respiratory Cough?: No Shortness of breath?: No  Endocrine Excessive thirst?: No  Musculoskeletal Back pain?: No Joint pain?: No  Neurological Headaches?: No Dizziness?: No  Psychologic Depression?: No Anxiety?: No  Physical Exam: BP (!) 147/83   Pulse 75   Ht 6' (1.829 m)   Wt (!) 380 lb (172.4 kg)   BMI 51.54 kg/m   Constitutional:  Alert and oriented, No acute distress. HEENT: [place] AT, moist mucus membranes.  Trachea midline, no masses. Cardiovascular: No clubbing, cyanosis, or edema. Respiratory: Normal respiratory effort, no increased work of breathing. GI: Abdomen is soft, nontender, nondistended, no abdominal masses, obese. Skin: No rashes, bruises or suspicious lesions. Neurologic: Grossly intact, no focal deficits, moving all 4 extremities. Psychiatric: Normal mood and affect.  Laboratory Data: Lab Results  Component Value Date   WBC 9.0 11/29/2017   HGB 12.1 (L) 11/29/2017   HCT 36.8 (L) 11/29/2017   MCV 89.1 11/29/2017   PLT 337 11/29/2017    Lab Results  Component Value Date   CREATININE 2.68 (H) 11/29/2017    Lab Results  Component Value Date   HGBA1C 4.9 12/17/2017    Urinalysis N/a   Pertinent Imaging: N/a  Assessment & Plan:    1. Prostate cancer ([place]) Intermediate risk prostate cancer status post I-125 brachytherapy seed implant 11/2017 No postoperative issues including no urinary symptoms Okay to stop Flomax and finasteride PSA in 6 months  - PSA   Return in about 6 months (around 07/22/2018) for PSA/  IPSS.  Hollice Espy, MD  [facility] [address] [place], [place] [address] [phone]

## 2018-01-21 LAB — PSA: Prostate Specific Ag, Serum: 4.4 ng/mL — ABNORMAL HIGH (ref 0.0–4.0)

## 2018-01-27 ENCOUNTER — Other Ambulatory Visit: Payer: Self-pay | Admitting: Internal Medicine

## 2018-01-27 DIAGNOSIS — I10 Essential (primary) hypertension: Secondary | ICD-10-CM

## 2018-01-31 ENCOUNTER — Encounter: Payer: Self-pay | Admitting: Radiation Oncology

## 2018-02-18 ENCOUNTER — Encounter: Payer: Self-pay | Admitting: Internal Medicine

## 2018-03-17 ENCOUNTER — Encounter: Payer: Self-pay | Admitting: Internal Medicine

## 2018-03-17 ENCOUNTER — Other Ambulatory Visit: Payer: Self-pay

## 2018-03-17 MED ORDER — OLMESARTAN MEDOXOMIL 40 MG PO TABS: 40.0000 mg | ORAL_TABLET | Freq: Every day | ORAL | 0 refills | Status: DC

## 2018-03-21 ENCOUNTER — Encounter: Payer: Self-pay | Admitting: Internal Medicine

## 2018-03-21 ENCOUNTER — Other Ambulatory Visit: Payer: Self-pay

## 2018-03-21 MED ORDER — OLMESARTAN MEDOXOMIL 40 MG PO TABS: 40.0000 mg | ORAL_TABLET | Freq: Every day | ORAL | 1 refills | Status: DC

## 2018-03-22 ENCOUNTER — Encounter: Payer: Self-pay | Admitting: Internal Medicine

## 2018-03-22 ENCOUNTER — Ambulatory Visit (INDEPENDENT_AMBULATORY_CARE_PROVIDER_SITE_OTHER): Payer: 59 | Admitting: Internal Medicine

## 2018-03-22 VITALS — BP 124/78 | HR 80 | Ht 72.0 in | Wt 381.0 lb

## 2018-03-22 DIAGNOSIS — I129 Hypertensive chronic kidney disease with stage 1 through stage 4 chronic kidney disease, or unspecified chronic kidney disease: Secondary | ICD-10-CM

## 2018-03-22 DIAGNOSIS — I10 Essential (primary) hypertension: Secondary | ICD-10-CM

## 2018-03-22 DIAGNOSIS — N183 Chronic kidney disease, stage 3 (moderate): Secondary | ICD-10-CM

## 2018-03-22 DIAGNOSIS — E1122 Type 2 diabetes mellitus with diabetic chronic kidney disease: Secondary | ICD-10-CM

## 2018-03-22 NOTE — Progress Notes (Signed)
Date:  03/22/2018   Name:  Benjamin Stewart   DOB:  May 05, 1976   MRN:  [id number]   Chief Complaint: Diabetes (Was taking metformin. Dr Holley Raring took patient off of it because of kidneys. Told to see PCP for substitute. )  Diabetes  He presents for his follow-up diabetic visit. He has type 2 diabetes mellitus. His disease course has been stable. Pertinent negatives for hypoglycemia include no headaches or tremors. Pertinent negatives for diabetes include no chest pain, no fatigue, no polydipsia and no polyuria. Current diabetic treatment includes oral agent (monotherapy) (metformin stopped by Nephrology). He is compliant with treatment all of the time. He is following a generally healthy diet. He monitors blood glucose at home 1-2 x per day. His breakfast blood glucose is taken between 7-8 am. His breakfast blood glucose range is generally 110-130 mg/dl. An ACE inhibitor/angiotensin II receptor blocker is being taken.   Lab Results  Component Value Date   HGBA1C 4.9 12/17/2017     Review of Systems  Constitutional: Negative for appetite change, fatigue and unexpected weight change.  Eyes: Negative for visual disturbance.  Respiratory: Negative for cough, shortness of breath and wheezing.   Cardiovascular: Negative for chest pain, palpitations and leg swelling.  Gastrointestinal: Negative for abdominal pain and blood in stool.  Endocrine: Negative for polydipsia and polyuria.  Genitourinary: Negative for dysuria and hematuria.  Skin: Negative for color change and rash.  Neurological: Negative for tremors, numbness and headaches.  Psychiatric/Behavioral: Negative for dysphoric mood.    Patient Active Problem List   Diagnosis Date Noted  . 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 Prostate cancer ([place]) 08/23/2015  . Type 2 DM with CKD stage 3 and hypertension ([place]) 07/16/2015  . Hyperlipidemia associated with type 2 diabetes mellitus ([place]) 07/16/2015  . Chronic  gout 12/04/2014  . Benign essential hematuria 12/04/2014  . Essential (primary) hypertension 12/04/2014  . Morbid obesity, unspecified obesity type ([place]) 12/04/2014    Allergies  Allergen Reactions  . Percocet [Oxycodone-Acetaminophen] Nausea And Vomiting    Past Surgical History:  Procedure Laterality Date  . PARTIAL COLECTOMY  2005  . RADIOACTIVE SEED IMPLANT N/A 12/06/2017   Procedure: RADIOACTIVE SEED IMPLANT/BRACHYTHERAPY IMPLANT;  Surgeon: Hollice Espy, MD;  Location: [facility] ORS;  Service: Urology;  Laterality: N/A;    Social History   Tobacco Use  . Smoking status: Never Smoker  . Smokeless tobacco: Never Used  Substance Use Topics  . Alcohol use: No    Alcohol/week: 0.0 standard drinks  . Drug use: No     Medication list has been reviewed and updated.  Current Meds  Medication Sig  . amLODipine (NORVASC) 10 MG tablet TAKE 1 TABLET BY MOUTH EVERY DAY  . atorvastatin (LIPITOR) 10 MG tablet TAKE 1 TABLET BY MOUTH EVERY DAY WITH BREAKFAST  . carvedilol (COREG) 6.25 MG tablet Take 6.25 mg by mouth 2 (two) times daily with a meal.   . cloNIDine (CATAPRES - DOSED IN MG/24 HR) 0.2 mg/24hr patch APP 1 PA EXT TO THE SKIN 1 TIME A WK  . colchicine 0.6 MG tablet One twice a day for gout flare - up to 5 days (Patient taking differently: Take 0.6 mg by mouth daily as needed. One twice a day for gout flare)  . glimepiride (AMARYL) 2 MG tablet TAKE 2 MG BY MOUTH ONCE DAILY WITH  BREAKFAST  . hydrALAZINE (APRESOLINE) 100 MG tablet Take 100 mg by mouth 3 (three) times  daily.   . hydrochlorothiazide (HYDRODIURIL) 25 MG tablet Take 25 mg by mouth daily.  Marland Kitchen olmesartan (BENICAR) 40 MG tablet Take 1 tablet (40 mg total) by mouth daily.    PHQ 2/9 Scores 01/05/2018 11/08/2017 09/30/2017 08/19/2017  PHQ - 2 Score 0 0 0 0    Physical Exam  Constitutional: He is oriented to person, place, and time. He appears well-developed. No distress.  HENT:  Head: Normocephalic and atraumatic.    Pulmonary/Chest: Effort normal. No respiratory distress.  Musculoskeletal: Normal range of motion.  Neurological: He is alert and oriented to person, place, and time.  Skin: Skin is warm and dry. No rash noted.  Psychiatric: He has a normal mood and affect. His behavior is normal. Thought content normal.  Nursing note and vitals reviewed.   BP 124/78   Pulse 80   Ht 6' (1.829 m)   Wt (!) 381 lb (172.8 kg)   SpO2 98%   BMI 51.67 kg/m   Assessment and Plan: 1. Essential (primary) hypertension On multiple agents with good control Followed by nephrology Dr. Holley Raring  2. Type 2 DM with CKD stage 3 and hypertension (HCC) Continue glipizide only - wait for A1C results Work on diet and weight reduction - Hemoglobin A1c   No orders of the defined types were placed in this encounter.   Partially dictated using Editor, commissioning. Any errors are unintentional.  Halina Maidens, MD [facility]  03/22/2018

## 2018-03-23 LAB — HEMOGLOBIN A1C
ESTIMATED AVERAGE GLUCOSE: 100 mg/dL
HEMOGLOBIN A1C: 5.1 % (ref 4.8–5.6)

## 2018-04-08 ENCOUNTER — Other Ambulatory Visit: Payer: Self-pay | Admitting: *Deleted

## 2018-04-08 ENCOUNTER — Inpatient Hospital Stay: Payer: 59 | Attending: Radiation Oncology

## 2018-04-08 DIAGNOSIS — C61 Malignant neoplasm of prostate: Secondary | ICD-10-CM

## 2018-04-08 LAB — PSA: Prostatic Specific Antigen: 2.49 ng/mL (ref 0.00–4.00)

## 2018-04-15 ENCOUNTER — Ambulatory Visit
Admission: RE | Admit: 2018-04-15 | Discharge: 2018-04-15 | Disposition: A | Discharge status: Home / Self Care | Payer: 59 | Source: Ambulatory Visit | Attending: Radiation Oncology | Admitting: Radiation Oncology

## 2018-04-15 ENCOUNTER — Encounter: Payer: Self-pay | Admitting: Radiation Oncology

## 2018-04-15 ENCOUNTER — Other Ambulatory Visit: Payer: Self-pay

## 2018-04-15 ENCOUNTER — Other Ambulatory Visit: Payer: Self-pay | Admitting: *Deleted

## 2018-04-15 VITALS — BP 146/86 | HR 74 | Temp 96.5°F | Resp 18 | Wt 386.9 lb

## 2018-04-15 DIAGNOSIS — C61 Malignant neoplasm of prostate: Secondary | ICD-10-CM | POA: Diagnosis not present

## 2018-04-15 DIAGNOSIS — Z923 Personal history of irradiation: Secondary | ICD-10-CM | POA: Insufficient documentation

## 2018-04-15 NOTE — Progress Notes (Signed)
Radiation Oncology Follow up Note  Name: Benjamin Stewart   Date:   04/15/2018 MRN:  [id number] DOB: [birth year]    This 42 y.o. male presents to the clinic today for four-month follow-up status post I-125 interstitial implant for stage IIa adenocarcinoma the prostate.  REFERRING PROVIDER: Glean Hess, MD  HPI: patient is a 42 year old male now seen out 4 months having completed I-125 interstitial implant for a Gleason 7 (3+4) adenocarcinoma the prostate initially presenting with an 8.4 PSA. Seen today in routine follow-up he is doing well. He specifically denies diarrhea dysuria or any other GI/GU complaints. His most recent PSA last week was 2.49.patient does have nocturia 3 some urgency and frequency although that is stable fromprior to treatment.  COMPLICATIONS OF TREATMENT: none  FOLLOW UP COMPLIANCE: keeps appointments   PHYSICAL EXAM:  BP (!) 146/86 (BP Location: Left Arm)   Pulse 74   Temp (!) 96.5 F (35.8 C) (Tympanic)   Resp 18   Wt (!) 386 lb 14.5 oz (175.5 kg)   BMI 52.47 kg/m  On rectal exam rectal sphincter tone is good. Prostate is smooth contracted without evidence of nodularity or mass. Sulcus is preserved bilaterally. No discrete nodularity is identified. No other rectal abnormalities are noted.Well-developed well-nourished patient in NAD. HEENT reveals PERLA, EOMI, discs not visualized.  Oral cavity is clear. No oral mucosal lesions are identified. Neck is clear without evidence of cervical or supraclavicular adenopathy. Lungs are clear to A&P. Cardiac examination is essentially unremarkable with regular rate and rhythm without murmur rub or thrill. Abdomen is benign with no organomegaly or masses noted. Motor sensory and DTR levels are equal and symmetric in the upper and lower extremities. Cranial nerves II through XII are grossly intact. Proprioception is intact. No peripheral adenopathy or edema is identified. No motor or sensory levels are noted. Crude visual  fields are within normal range.  RADIOLOGY RESULTS: no current films for review  PLAN: present time his PSA is having a downward spiral although it is still early for I-125 interstitial implant to give an exact reading of his overallsuccessive treatment. I've asked to see him back in 6 months for follow-up with a PSA prior to that. I have offered some Flomax although patient is denied stating that his urinary frequency and nocturia is not that significant. Patient continues follow-up care with urology. He knows to call with any concerns.  I would like to take this opportunity to thank you for allowing me to participate in the care of your patient.Noreene Filbert, MD

## 2018-04-19 ENCOUNTER — Ambulatory Visit
Admission: EM | Admit: 2018-04-19 | Discharge: 2018-04-19 | Disposition: A | Discharge status: Home / Self Care | Payer: 59 | Attending: Family Medicine | Admitting: Family Medicine

## 2018-04-19 ENCOUNTER — Other Ambulatory Visit: Payer: Self-pay

## 2018-04-19 ENCOUNTER — Ambulatory Visit (INDEPENDENT_AMBULATORY_CARE_PROVIDER_SITE_OTHER)
Admit: 2018-04-19 | Discharge: 2018-04-19 | Disposition: A | Discharge status: Home / Self Care | Payer: 59 | Attending: Family Medicine | Admitting: Family Medicine

## 2018-04-19 ENCOUNTER — Encounter: Payer: Self-pay | Admitting: Internal Medicine

## 2018-04-19 ENCOUNTER — Encounter: Payer: Self-pay | Admitting: Emergency Medicine

## 2018-04-19 DIAGNOSIS — R1032 Left lower quadrant pain: Secondary | ICD-10-CM

## 2018-04-19 DIAGNOSIS — Z87442 Personal history of urinary calculi: Secondary | ICD-10-CM | POA: Diagnosis not present

## 2018-04-19 DIAGNOSIS — K429 Umbilical hernia without obstruction or gangrene: Secondary | ICD-10-CM | POA: Diagnosis not present

## 2018-04-19 LAB — URINALYSIS, COMPLETE (UACMP) WITH MICROSCOPIC
BACTERIA UA: NONE SEEN
BILIRUBIN URINE: NEGATIVE
Glucose, UA: NEGATIVE mg/dL
Hgb urine dipstick: NEGATIVE
Ketones, ur: NEGATIVE mg/dL
LEUKOCYTES UA: NEGATIVE
NITRITE: NEGATIVE
Protein, ur: 30 mg/dL — AB
SPECIFIC GRAVITY, URINE: 1.01 (ref 1.005–1.030)
Squamous Epithelial / LPF: NONE SEEN (ref 0–5)
WBC, UA: NONE SEEN WBC/hpf (ref 0–5)
pH: 6 (ref 5.0–8.0)

## 2018-04-19 MED ORDER — TRAMADOL HCL 50 MG PO TABS: 50.0000 mg | ORAL_TABLET | Freq: Two times a day (BID) | ORAL | 0 refills | Status: DC | PRN

## 2018-04-19 NOTE — ED Provider Notes (Signed)
[facility]    CSN: [id number] Arrival date & time: 04/19/18  0825  History   Chief Complaint Chief Complaint  Patient presents with  . Abdominal Pain   HPI  42 year old male presents with abdominal pain.  Patient reports that he developed left lower quadrant pain yesterday.  Intermittent.  Has been severe, 10/10 pressure.  Currently he is asymptomatic.  Has a history of what sounds to be diverticulitis with subsequent bowel surgery as well as a history of nephrolithiasis.  No known inciting factor.  No known exacerbating or relieving factors.  No reports of nausea, vomiting, diarrhea.  No urinary symptoms.  No other associated symptoms.  No other complaints.  PMH, Surgical Hx, Family Hx, Social History reviewed and updated as below.  Past Medical History:  Diagnosis Date  . Benign essential hematuria 12/04/2014   microscopic hematuria evaluation with CT and cysto - treated for 2 months with bactrim DS; no further f/u planned unless hematuria recurs.   . Controlled type 2 diabetes mellitus without complication, without long-term current use of insulin ([place]) 07/16/2015  . Essential (primary) hypertension 12/04/2014  . History of kidney stones   . prostate cancer   . Sleep apnea     Patient Active Problem List   Diagnosis Date Noted  . 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 Prostate cancer ([place]) 08/23/2015  . Type 2 DM with CKD stage 3 and hypertension ([place]) 07/16/2015  . Hyperlipidemia associated with type 2 diabetes mellitus ([place]) 07/16/2015  . Chronic gout 12/04/2014  . Benign essential hematuria 12/04/2014  . Essential (primary) hypertension 12/04/2014  . Morbid obesity, unspecified obesity type ([place]) 12/04/2014    Past Surgical History:  Procedure Laterality Date  . PARTIAL COLECTOMY  2005  . RADIOACTIVE SEED IMPLANT N/A 12/06/2017   Procedure: RADIOACTIVE SEED IMPLANT/BRACHYTHERAPY IMPLANT;  Surgeon: Hollice Espy, MD;  Location:  [facility] ORS;  Service: Urology;  Laterality: N/A;       Home Medications    Prior to Admission medications   Medication Sig Start Date End Date Taking? Authorizing Provider  amLODipine (NORVASC) 10 MG tablet TAKE 1 TABLET BY MOUTH EVERY DAY 01/27/18  Yes Glean Hess, MD  atorvastatin (LIPITOR) 10 MG tablet TAKE 1 TABLET BY MOUTH EVERY DAY WITH BREAKFAST 01/27/18  Yes Glean Hess, MD  carvedilol (COREG) 6.25 MG tablet Take 6.25 mg by mouth 2 (two) times daily with a meal.  03/30/17  Yes [provider]  cloNIDine (CATAPRES - DOSED IN MG/24 HR) 0.2 mg/24hr patch APP 1 PA EXT TO THE SKIN 1 TIME A WK 01/18/18  Yes [provider]  colchicine 0.6 MG tablet One twice a day for gout flare - up to 5 days Patient taking differently: Take 0.6 mg by mouth daily as needed. One twice a day for gout flare 04/01/17  Yes Glean Hess, MD  glimepiride (AMARYL) 2 MG tablet TAKE 2 MG BY MOUTH ONCE DAILY WITH  BREAKFAST 11/24/17  Yes Glean Hess, MD  hydrALAZINE (APRESOLINE) 100 MG tablet Take 100 mg by mouth 3 (three) times daily.  03/29/17  Yes [provider]  olmesartan (BENICAR) 40 MG tablet Take 40 mg by mouth daily.   Yes [provider]  traMADol (ULTRAM) 50 MG tablet Take 1 tablet (50 mg total) by mouth every 12 (twelve) hours as needed. 04/19/18   Coral Spikes, DO    Family History Family History  Problem Relation Age of Onset  .  Diabetes Mother   . Heart disease Mother   . Hypertension Mother   . Diabetes Maternal Grandmother   . Diabetes Maternal Grandfather   . Prostate cancer Maternal Uncle     Social History Social History   Tobacco Use  . Smoking status: Never Smoker  . Smokeless tobacco: Never Used  Substance Use Topics  . Alcohol use: No    Alcohol/week: 0.0 standard drinks  . Drug use: No     Allergies   Percocet [oxycodone-acetaminophen]   Review of Systems Review of Systems  Constitutional: Negative.     Gastrointestinal: Positive for abdominal pain. Negative for diarrhea, nausea and vomiting.   Physical Exam Triage Vital Signs ED Triage Vitals [04/19/18 0859]  Enc Vitals Group     BP (!) 144/82     Pulse Rate 75     Resp 18     Temp 98.4 F (36.9 C)     Temp Source Oral     SpO2 100 %     Weight (!) 385 lb (174.6 kg)     Height 6\' 2"  (1.88 m)     Head Circumference      Peak Flow      Pain Score 1     Pain Loc      Pain Edu?      Excl. in [place]?    Updated Vital Signs BP (!) 144/82 (BP Location: Left Arm)   Pulse 75   Temp 98.4 F (36.9 C) (Oral)   Resp 18   Ht 6\' 2"  (1.88 m)   Wt (!) 174.6 kg   SpO2 100%   BMI 49.43 kg/m   Visual Acuity Right Eye Distance:   Left Eye Distance:   Bilateral Distance:    Right Eye Near:   Left Eye Near:    Bilateral Near:     Physical Exam  Constitutional: He is oriented to person, place, and time. He appears well-developed. No distress.  HENT:  Head: Normocephalic and atraumatic.  Cardiovascular: Normal rate and regular rhythm.  Pulmonary/Chest: Effort normal and breath sounds normal.  Neurological: He is alert and oriented to person, place, and time.  Psychiatric: He has a normal mood and affect. His behavior is normal.  Nursing note and vitals reviewed.  UC Treatments / Results  Labs (all labs ordered are listed, but only abnormal results are displayed) Labs Reviewed  URINALYSIS, COMPLETE (UACMP) WITH MICROSCOPIC - Abnormal; Notable for the following components:      Result Value   Protein, ur 30 (*)    All other components within normal limits   EKG None  Radiology Ct Abdomen Pelvis Wo Contrast  Result Date: 04/19/2018 CLINICAL DATA:  Flank pain with recurrent stone disease suspected. EXAM: CT ABDOMEN AND PELVIS WITHOUT CONTRAST TECHNIQUE: Multidetector CT imaging of the abdomen and pelvis was performed following the standard protocol without IV contrast. COMPARISON:  02/10/2012 FINDINGS: Lower chest:  No  contributory findings. Hepatobiliary: 17 mm low-density in the subcapsular right liver is not clearly cystic density but is essentially size stable from 2013 and thus benign.Questionable calcified gallstone in the dependent gallbladder Pancreas: Unremarkable. Spleen: Unremarkable. Adrenals/Urinary Tract: Negative adrenals. No hydronephrosis or stone. Probable small cyst in the lower pole right kidney. Unremarkable bladder. Stomach/Bowel:  No obstruction. No appendicitis. Vascular/Lymphatic: No acute vascular abnormality. No mass or adenopathy. Reproductive:Prostate brachytherapy seeds in place. Other: No ascites or pneumoperitoneum. Fatty umbilical and paraumbilical hernias. Musculoskeletal: No acute abnormalities. No sclerotic lesions. There is advanced lower lumbar  facet spurring. Thoracic spondylosis. IMPRESSION: 1. No acute finding.  No hydronephrosis or urolithiasis. 2. Questionable gallstone. 3. Fatty umbilical and paraumbilical hernias. Electronically Signed   By: Monte Fantasia M.D.   On: 04/19/2018 10:32   Procedures Procedures (including critical care time)  Medications Ordered in UC Medications - No data to display  Initial Impression / Assessment and Plan / UC Course  I have reviewed the triage vital signs and the nursing notes.  Pertinent labs & imaging results that were available during my care of the patient were reviewed by me and considered in my medical decision making (see chart for details).    42 year old male presents with left lower quadrant pain.  Concern for stone versus diverticulitis.  CT unrevealing.  Tramadol as needed for pain.  Supportive care.  Final Clinical Impressions(s) / UC Diagnoses   Final diagnoses:  LLQ pain     Discharge Instructions     CT negative. Use the pain medication as needed.  Take care  Dr. Lacinda Axon    ED Prescriptions    Medication Sig Dispense Auth. Provider   traMADol (ULTRAM) 50 MG tablet Take 1 tablet (50 mg total) by mouth  every 12 (twelve) hours as needed. 10 tablet Coral Spikes, DO     Controlled Substance Prescriptions [place] Controlled Substance Registry consulted? Not Applicable   Coral Spikes, DO 04/19/18 1057

## 2018-04-19 NOTE — ED Notes (Signed)
Contacted [employer] for prior authorization. Patient does not require prior authorization for CT per [facility].

## 2018-04-19 NOTE — Discharge Instructions (Signed)
CT negative. Use the pain medication as needed.  Take care  Dr. Lacinda Axon

## 2018-04-19 NOTE — ED Triage Notes (Signed)
Patient c/o sharp pain in LLQ that started yesterday. Denies nausea, vomiting or diarrhea. Denies urinary symptoms.

## 2018-04-25 ENCOUNTER — Ambulatory Visit: Payer: 59 | Admitting: Internal Medicine

## 2018-04-25 ENCOUNTER — Other Ambulatory Visit: Payer: Self-pay | Admitting: Internal Medicine

## 2018-04-25 DIAGNOSIS — I10 Essential (primary) hypertension: Secondary | ICD-10-CM

## 2018-05-16 DIAGNOSIS — E1122 Type 2 diabetes mellitus with diabetic chronic kidney disease: Secondary | ICD-10-CM | POA: Diagnosis not present

## 2018-05-16 DIAGNOSIS — I10 Essential (primary) hypertension: Secondary | ICD-10-CM | POA: Diagnosis not present

## 2018-05-16 DIAGNOSIS — N2581 Secondary hyperparathyroidism of renal origin: Secondary | ICD-10-CM | POA: Diagnosis not present

## 2018-05-16 DIAGNOSIS — N183 Chronic kidney disease, stage 3 (moderate): Secondary | ICD-10-CM | POA: Diagnosis not present

## 2018-07-18 ENCOUNTER — Encounter: Payer: Self-pay | Admitting: Internal Medicine

## 2018-07-18 ENCOUNTER — Encounter: Payer: Self-pay | Admitting: Urology

## 2018-07-19 ENCOUNTER — Encounter: Payer: Self-pay | Admitting: Urology

## 2018-07-19 DIAGNOSIS — Z8546 Personal history of malignant neoplasm of prostate: Secondary | ICD-10-CM

## 2018-07-22 ENCOUNTER — Other Ambulatory Visit: Payer: 59

## 2018-07-22 DIAGNOSIS — C61 Malignant neoplasm of prostate: Secondary | ICD-10-CM | POA: Diagnosis not present

## 2018-07-23 LAB — PSA: Prostate Specific Ag, Serum: 2 ng/mL (ref 0.0–4.0)

## 2018-07-24 ENCOUNTER — Other Ambulatory Visit: Payer: Self-pay | Admitting: Internal Medicine

## 2018-07-24 DIAGNOSIS — I10 Essential (primary) hypertension: Secondary | ICD-10-CM

## 2018-07-25 ENCOUNTER — Ambulatory Visit (INDEPENDENT_AMBULATORY_CARE_PROVIDER_SITE_OTHER): Payer: 59 | Admitting: Internal Medicine

## 2018-07-25 ENCOUNTER — Encounter: Payer: Self-pay | Admitting: Internal Medicine

## 2018-07-25 VITALS — BP 114/78 | HR 80 | Ht 74.0 in | Wt 384.0 lb

## 2018-07-25 DIAGNOSIS — E1169 Type 2 diabetes mellitus with other specified complication: Secondary | ICD-10-CM | POA: Diagnosis not present

## 2018-07-25 DIAGNOSIS — Z23 Encounter for immunization: Secondary | ICD-10-CM

## 2018-07-25 DIAGNOSIS — E1122 Type 2 diabetes mellitus with diabetic chronic kidney disease: Secondary | ICD-10-CM

## 2018-07-25 DIAGNOSIS — E785 Hyperlipidemia, unspecified: Secondary | ICD-10-CM

## 2018-07-25 DIAGNOSIS — N183 Chronic kidney disease, stage 3 (moderate): Secondary | ICD-10-CM

## 2018-07-25 DIAGNOSIS — I129 Hypertensive chronic kidney disease with stage 1 through stage 4 chronic kidney disease, or unspecified chronic kidney disease: Secondary | ICD-10-CM

## 2018-07-25 DIAGNOSIS — I10 Essential (primary) hypertension: Secondary | ICD-10-CM

## 2018-07-25 NOTE — Progress Notes (Signed)
Date:  07/25/2018   Name:  Benjamin Stewart   DOB:  1976-05-02   MRN:  [id number]   Chief Complaint: Diabetes (Follow up. Flu shot?) and Hypertension  Diabetes  He presents for his follow-up diabetic visit. He has type 2 diabetes mellitus. Pertinent negatives for hypoglycemia include no dizziness, headaches, sleepiness or tremors. Pertinent negatives for diabetes include no chest pain and no fatigue. Diabetic complications include nephropathy. Risk factors for coronary artery disease include diabetes mellitus, hypertension and obesity. Current diabetic treatment includes oral agent (monotherapy). He is compliant with treatment all of the time. He monitors blood glucose at home 1-2 x per day. There is no change in his home blood glucose trend. His breakfast blood glucose is taken between 6-7 am. His breakfast blood glucose range is generally 90-110 mg/dl. An ACE inhibitor/angiotensin II receptor blocker is being taken. Eye exam is current.  Hypertension  This is a chronic problem. The problem is controlled. Pertinent negatives include no chest pain, headaches, palpitations or shortness of breath. Past treatments include angiotensin blockers, beta blockers, calcium channel blockers, direct vasodilators and central alpha agonists.  CKD - followed by Nephrology.  No recent labs received.   His BP has been fairly well controlled on the current regimen.  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  Lab Results  Component Value Date   HGBA1C 5.1 03/22/2018   Lab Results  Component Value Date   CREATININE 2.68 (H) 11/29/2017   BUN 46 (H) 11/29/2017   NA 142 11/29/2017   K 4.7 11/29/2017   CL 113 (H) 11/29/2017   CO2 25 11/29/2017      Review of Systems  Constitutional: Negative for chills, fatigue, fever and unexpected weight change.  HENT: Negative for hearing loss and trouble swallowing.   Eyes: Negative for visual disturbance.    Respiratory: Negative for choking and shortness of breath.   Cardiovascular: Negative for chest pain, palpitations and leg swelling.  Gastrointestinal: Negative for abdominal pain.  Neurological: Negative for dizziness, tremors and headaches.    Patient Active Problem List   Diagnosis Date Noted  . 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 Prostate cancer (Benjamin Stewart) 08/23/2015  . Type 2 DM with CKD stage 3 and hypertension (Benjamin Stewart) 07/16/2015  . Hyperlipidemia associated with type 2 diabetes mellitus (Benjamin Stewart) 07/16/2015  . Chronic gout 12/04/2014  . Benign essential hematuria 12/04/2014  . Essential (primary) hypertension 12/04/2014  . Morbid obesity, unspecified obesity type (Benjamin Stewart) 12/04/2014    Allergies  Allergen Reactions  . Percocet [Oxycodone-Acetaminophen] Nausea And Vomiting    Past Surgical History:  Procedure Laterality Date  . PARTIAL COLECTOMY  2005  . RADIOACTIVE SEED IMPLANT N/A 12/06/2017   Procedure: RADIOACTIVE SEED IMPLANT/BRACHYTHERAPY IMPLANT;  Surgeon: Hollice Espy, MD;  Location: [facility] ORS;  Service: Urology;  Laterality: N/A;    Social History   Tobacco Use  . Smoking status: Never Smoker  . Smokeless tobacco: Never Used  Substance Use Topics  . Alcohol use: No    Alcohol/week: 0.0 standard drinks  . Drug use: No     Medication list has been reviewed and updated.  Current Meds  Medication Sig  . amLODipine (NORVASC) 10 MG tablet TAKE 1 TABLET BY MOUTH EVERY DAY  . atorvastatin (LIPITOR) 10 MG tablet TAKE 1 TABLET BY MOUTH EVERY DAY WITH BREAKFAST  . carvedilol (COREG) 6.25 MG tablet Take 6.25 mg by mouth 2 (two)  times daily with a meal.   . cloNIDine (CATAPRES - DOSED IN MG/24 HR) 0.2 mg/24hr patch APP 1 PA EXT TO THE SKIN 1 TIME A WK  . colchicine 0.6 MG tablet One twice a day for gout flare - up to 5 days (Patient taking differently: Take 0.6 mg by mouth daily as needed. One twice a day for gout flare)  . glimepiride (AMARYL) 2  MG tablet TAKE 2 MG BY MOUTH ONCE DAILY WITH  BREAKFAST  . hydrALAZINE (APRESOLINE) 100 MG tablet Take 100 mg by mouth 3 (three) times daily.  Marland Kitchen olmesartan (BENICAR) 40 MG tablet Take 40 mg by mouth daily.  . traMADol (ULTRAM) 50 MG tablet Take 1 tablet (50 mg total) by mouth every 12 (twelve) hours as needed.    PHQ 2/9 Scores 01/05/2018 11/08/2017 09/30/2017 08/19/2017  PHQ - 2 Score 0 0 0 0   Wt Readings from Last 3 Encounters:  07/25/18 (!) 384 lb (174.2 kg)  04/19/18 (!) 385 lb (174.6 kg)  04/15/18 (!) 386 lb 14.5 oz (175.5 kg)    Physical Exam Vitals signs and nursing note reviewed.  Constitutional:      General: He is not in acute distress.    Appearance: He is well-developed. He is obese.  HENT:     Head: Normocephalic and atraumatic.  Eyes:     Pupils: Pupils are equal, round, and reactive to light.  Neck:     Musculoskeletal: Normal range of motion and neck supple.     Vascular: No carotid bruit.  Cardiovascular:     Rate and Rhythm: Normal rate and regular rhythm.     Pulses: Normal pulses.  Pulmonary:     Effort: Pulmonary effort is normal. No respiratory distress.     Breath sounds: Normal breath sounds.  Musculoskeletal: Normal range of motion.  Skin:    General: Skin is warm and dry.     Findings: No rash.  Neurological:     Mental Status: He is alert and oriented to person, place, and time.  Psychiatric:        Behavior: Behavior normal.        Thought Content: Thought content normal.     BP 114/78 (BP Location: Right Arm, Patient Position: Sitting, Cuff Size: Normal)   Pulse 80   Ht 6\' 2"  (1.88 m)   Wt (!) 384 lb (174.2 kg)   SpO2 97%   BMI 49.30 kg/m   Assessment and Plan: 1. Type 2 DM with CKD stage 3 and hypertension (HCC) Continue glipizide Check labs at visit next month  2. Hyperlipidemia associated with type 2 diabetes mellitus ([place]) Continue statin   3. Essential (primary) hypertension Controlled on 5 agents  4. Need for vaccination  for pneumococcus - Pneumococcal polysaccharide vaccine 23-valent greater than or equal to 2yo subcutaneous/IM  5. Morbid obesity, unspecified obesity type (Benjamin Stewart) - Amb Referral to Bariatric Surgery  6. Need for immunization against influenza - Flu Vaccine QUAD 36+ mos IM   Partially dictated using Editor, commissioning. Any errors are unintentional.  Halina Maidens, MD [facility]  07/25/2018

## 2018-07-25 NOTE — Patient Instructions (Signed)

## 2018-07-26 ENCOUNTER — Ambulatory Visit (INDEPENDENT_AMBULATORY_CARE_PROVIDER_SITE_OTHER): Payer: 59 | Admitting: Urology

## 2018-07-26 ENCOUNTER — Encounter: Payer: Self-pay | Admitting: Urology

## 2018-07-26 VITALS — BP 145/87 | HR 81 | Ht 72.0 in | Wt 397.0 lb

## 2018-07-26 DIAGNOSIS — R350 Frequency of micturition: Secondary | ICD-10-CM | POA: Diagnosis not present

## 2018-07-26 DIAGNOSIS — Z87442 Personal history of urinary calculi: Secondary | ICD-10-CM

## 2018-07-26 DIAGNOSIS — Z8546 Personal history of malignant neoplasm of prostate: Secondary | ICD-10-CM | POA: Diagnosis not present

## 2018-07-26 NOTE — Progress Notes (Signed)
07/26/2018 2:55 PM   Benjamin Stewart 15-Feb-1976 [id number]  Referring provider: Glean Hess, MD [address] [place], [place] [address]  Chief Complaint  Patient presents with  . Prostate Cancer    6 mo follow up    HPI: 42 year old male with a personal history of prostate cancer who returns today for routine 5-month follow-up.  He has a personal history of Gleason 3+4 intermediate risk prostate cancer, initially diagnosed in 2017 on active surveillance with a PSA which subsequently rose to 8.4 at the time of treatment.  He elected to pursue I-125 brachytherapy prostate seed implant completed on 11/2017.  His PSA continues to trend down.  His most recent PSA on 07/2018 was 2.0.  He has no significant urinary symptoms other than for urinary frequency.  He is not particularly bothered by this.  He feels like he empties his bladder and has a good stream.  He was previously offered Flomax but declined.  He does have a strong family history of prostate cancer.  His father was recently diagnosed and treated with prostate cancer.  He also has several uncles with prostate cancer.  He has no biological children himself.  He does also have a remote history of kidney stones.  He was recently ER in 04/2018 with flank pain thought to have a stone.  This showed no stones was otherwise negative CT scan.  He has had no recurrent flank pain since.   PMH: Past Medical History:  Diagnosis Date  . Benign essential hematuria 12/04/2014   microscopic hematuria evaluation with CT and cysto - treated for 2 months with bactrim DS; no further f/u planned unless hematuria recurs.   . Controlled type 2 diabetes mellitus without complication, without long-term current use of insulin ([place]) 07/16/2015  . Essential (primary) hypertension 12/04/2014  . History of kidney stones   . prostate cancer   . Sleep apnea     Surgical History: Past Surgical History:  Procedure Laterality Date  .  PARTIAL COLECTOMY  2005  . RADIOACTIVE SEED IMPLANT N/A 12/06/2017   Procedure: RADIOACTIVE SEED IMPLANT/BRACHYTHERAPY IMPLANT;  Surgeon: Hollice Espy, MD;  Location: [facility] ORS;  Service: Urology;  Laterality: N/A;    Home Medications:  Allergies as of 07/26/2018      Reactions   Percocet [oxycodone-acetaminophen] Nausea And Vomiting      Medication List       Accurate as of July 26, 2018  2:55 PM. Always use your most recent med list.        amLODipine 10 MG tablet Commonly known as:  NORVASC TAKE 1 TABLET BY MOUTH EVERY DAY   atorvastatin 10 MG tablet Commonly known as:  LIPITOR TAKE 1 TABLET BY MOUTH EVERY DAY WITH BREAKFAST   carvedilol 6.25 MG tablet Commonly known as:  COREG Take 6.25 mg by mouth 2 (two) times daily with a meal.   cloNIDine 0.2 mg/24hr patch Commonly known as:  CATAPRES - Dosed in mg/24 hr APP 1 PA EXT TO THE SKIN 1 TIME A WK   colchicine 0.6 MG tablet One twice a day for gout flare - up to 5 days   glimepiride 2 MG tablet Commonly known as:  AMARYL TAKE 2 MG BY MOUTH ONCE DAILY WITH  BREAKFAST   hydrALAZINE 100 MG tablet Commonly known as:  APRESOLINE Take 100 mg by mouth 3 (three) times daily.   olmesartan 40 MG tablet Commonly known as:  BENICAR Take 40 mg by mouth daily.  traMADol 50 MG tablet Commonly known as:  ULTRAM Take 1 tablet (50 mg total) by mouth every 12 (twelve) hours as needed.       Allergies:  Allergies  Allergen Reactions  . Percocet [Oxycodone-Acetaminophen] Nausea And Vomiting    Family History: Family History  Problem Relation Age of Onset  . Diabetes Mother   . Heart disease Mother   . Hypertension Mother   . Diabetes Maternal Grandmother   . Diabetes Maternal Grandfather   . Prostate cancer Maternal Uncle     Social History:  reports that he has never smoked. He has never used smokeless tobacco. He reports that he does not drink alcohol or use drugs.  ROS: UROLOGY Frequent Urination?:  Yes Hard to postpone urination?: No Burning/pain with urination?: No Get up at night to urinate?: Yes Leakage of urine?: No Urine stream starts and stops?: No Trouble starting stream?: No Do you have to strain to urinate?: No Blood in urine?: No Urinary tract infection?: No Sexually transmitted disease?: No Injury to kidneys or bladder?: No Painful intercourse?: No Weak stream?: No Erection problems?: No Penile pain?: No  Gastrointestinal Nausea?: No Vomiting?: No Indigestion/heartburn?: No Diarrhea?: No Constipation?: No  Constitutional Fever: No Night sweats?: No Weight loss?: No Fatigue?: No  Skin Skin rash/lesions?: No Itching?: No  Eyes Blurred vision?: No Double vision?: No  Ears/Nose/Throat Sore throat?: No Sinus problems?: No  Hematologic/Lymphatic Swollen glands?: No Easy bruising?: No  Cardiovascular Leg swelling?: No Chest pain?: No  Respiratory Cough?: No Shortness of breath?: No  Endocrine Excessive thirst?: No  Musculoskeletal Back pain?: No Joint pain?: No  Neurological Headaches?: No Dizziness?: No  Psychologic Depression?: No Anxiety?: No  Physical Exam: BP (!) 145/87   Pulse 81   Ht 6' (1.829 m)   Wt (!) 397 lb (180.1 kg)   BMI 53.84 kg/m   Constitutional:  Alert and oriented, No acute distress. HEENT: [place] AT, moist mucus membranes.  Trachea midline, no masses. Cardiovascular: No clubbing, cyanosis, or edema. Respiratory: Normal respiratory effort, no increased work of breathing. GI: Abd obese Skin: No rashes, bruises or suspicious lesions. Neurologic: Grossly intact, no focal deficits, moving all 4 extremities. Psychiatric: Normal mood and affect.   Laboratory Data: Lab Results  Component Value Date   WBC 9.0 11/29/2017   HGB 12.1 (L) 11/29/2017   HCT 36.8 (L) 11/29/2017   MCV 89.1 11/29/2017   PLT 337 11/29/2017    Lab Results  Component Value Date   CREATININE 2.68 (H) 11/29/2017   PSA as  above.  Imaging: CT abdomen pelvis without contrast from 04/2018 personally reviewed.  There is no GU pathology.  Seeds appear to be in good distribution.  Assessment & Plan:    1. History of prostate cancer Personal history of prostate cancer status post brachii seed implantation Recommend every 6 months PSA, alternating between Dr. Donella Stade and myself Plan for PSA in 9 months with me, 3 months with Dr. Donella Stade as already scheduled We did have a discussion today about his relatively young age and strong family history of prostate cancer, offered genetic testing but given that he has no biological offspring, is not interested in pursuing this - PSA; Future  2. Urinary frequency Urinary frequency with minimal bother Discussed pharmacotherapy, not interested  3. History of kidney stones  Episode of flank pain in 04/2018 CT reviewed today, no evidence of stones    Return in about 9 months (around 04/27/2019) for PSA.  Hollice Espy, MD  [facility] [address] [place], [place] [address] [phone]

## 2018-08-16 ENCOUNTER — Ambulatory Visit (INDEPENDENT_AMBULATORY_CARE_PROVIDER_SITE_OTHER): Payer: 59 | Admitting: Internal Medicine

## 2018-08-16 ENCOUNTER — Encounter: Payer: Self-pay | Admitting: Internal Medicine

## 2018-08-16 VITALS — BP 142/78 | HR 73 | Ht 72.0 in | Wt 398.4 lb

## 2018-08-16 DIAGNOSIS — E1169 Type 2 diabetes mellitus with other specified complication: Secondary | ICD-10-CM | POA: Diagnosis not present

## 2018-08-16 DIAGNOSIS — E1122 Type 2 diabetes mellitus with diabetic chronic kidney disease: Secondary | ICD-10-CM | POA: Diagnosis not present

## 2018-08-16 DIAGNOSIS — N183 Chronic kidney disease, stage 3 (moderate): Secondary | ICD-10-CM | POA: Diagnosis not present

## 2018-08-16 DIAGNOSIS — I10 Essential (primary) hypertension: Secondary | ICD-10-CM | POA: Diagnosis not present

## 2018-08-16 DIAGNOSIS — Z0000 Encounter for general adult medical examination without abnormal findings: Secondary | ICD-10-CM | POA: Diagnosis not present

## 2018-08-16 DIAGNOSIS — M1A09X0 Idiopathic chronic gout, multiple sites, without tophus (tophi): Secondary | ICD-10-CM

## 2018-08-16 DIAGNOSIS — C61 Malignant neoplasm of prostate: Secondary | ICD-10-CM

## 2018-08-16 DIAGNOSIS — I129 Hypertensive chronic kidney disease with stage 1 through stage 4 chronic kidney disease, or unspecified chronic kidney disease: Secondary | ICD-10-CM | POA: Diagnosis not present

## 2018-08-16 DIAGNOSIS — E785 Hyperlipidemia, unspecified: Secondary | ICD-10-CM

## 2018-08-16 NOTE — Patient Instructions (Signed)

## 2018-08-16 NOTE — Progress Notes (Signed)
Date:  08/16/2018   Name:  Benjamin Stewart   DOB:  1976/03/01   MRN:  [id number]   Chief Complaint: Annual Exam Benjamin Stewart is a 43 y.o. male who presents today for his Complete Annual Exam. He feels well. He reports exercising rarely but plans to start going to the gym. He reports he is sleeping well.   Hypertension  This is a chronic problem. The problem is controlled. Pertinent negatives include no chest pain, headaches, palpitations or shortness of breath. Past treatments include beta blockers and calcium channel blockers.  Diabetes  He presents for his follow-up diabetic visit. He has type 2 diabetes mellitus. Pertinent negatives for hypoglycemia include no dizziness or headaches. Pertinent negatives for diabetes include no chest pain, no fatigue, no polydipsia and no polyuria. Current diabetic treatment includes oral agent (monotherapy). He is compliant with treatment all of the time. He monitors blood glucose at home 1-2 x per week. His breakfast blood glucose is taken between 6-7 am. His breakfast blood glucose range is generally 70-90 mg/dl. An ACE inhibitor/angiotensin II receptor blocker is being taken.  Hyperlipidemia  Pertinent negatives include no chest pain, myalgias or shortness of breath. Current antihyperlipidemic treatment includes statins.  Gout - no recent episodes, take colchicine as needed. Renal Insuff - has been stable, followed by nephrology who also suggested bariatric surgery.  Referral has been placed. Prostate cancer - being monitored by Urology and Oncology since radioactive seed implants.  PSA has been dropping.  Review of Systems  Constitutional: Negative for appetite change, chills, diaphoresis, fatigue and unexpected weight change.  HENT: Negative for hearing loss, tinnitus, trouble swallowing and voice change.   Eyes: Negative for visual disturbance.  Respiratory: Negative for choking, shortness of breath and wheezing.   Cardiovascular: Negative for  chest pain, palpitations and leg swelling.  Gastrointestinal: Negative for abdominal pain, blood in stool, constipation and diarrhea.  Endocrine: Negative for polydipsia and polyuria.  Genitourinary: Negative for difficulty urinating, dysuria and frequency.       Nocturia x 3  Musculoskeletal: Negative for arthralgias, back pain and myalgias.  Skin: Negative for color change and rash.  Neurological: Negative for dizziness, syncope and headaches.  Hematological: Negative for adenopathy.  Psychiatric/Behavioral: Negative for dysphoric mood and sleep disturbance.    Patient Active Problem List   Diagnosis Date Noted  . Chronic gout of multiple sites 08/16/2018  . 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 Prostate cancer ([place]) 08/23/2015  . 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 Hyperlipidemia associated with type 2 diabetes mellitus ([place]) 07/16/2015  . Benign essential hematuria 12/04/2014  . Essential (primary) hypertension 12/04/2014  . Morbid obesity, unspecified obesity type ([place]) 12/04/2014    Allergies  Allergen Reactions  . Percocet [Oxycodone-Acetaminophen] Nausea And Vomiting    Past Surgical History:  Procedure Laterality Date  . PARTIAL COLECTOMY  2005  . RADIOACTIVE SEED IMPLANT N/A 12/06/2017   Procedure: RADIOACTIVE SEED IMPLANT/BRACHYTHERAPY IMPLANT;  Surgeon: Hollice Espy, MD;  Location: [facility] ORS;  Service: Urology;  Laterality: N/A;    Social History   Tobacco Use  . Smoking status: Never Smoker  . Smokeless tobacco: Never Used  Substance Use Topics  . Alcohol use: No    Alcohol/week: 0.0 standard drinks  . Drug use: No     Medication list has been reviewed and updated.  Current Meds  Medication Sig  . amLODipine (NORVASC) 10 MG tablet TAKE  1 TABLET BY MOUTH EVERY DAY  . atorvastatin (LIPITOR) 10 MG tablet TAKE 1 TABLET BY MOUTH EVERY DAY WITH BREAKFAST  . carvedilol (COREG) 6.25 MG tablet Take 6.25  mg by mouth 2 (two) times daily with a meal.   . cloNIDine (CATAPRES - DOSED IN MG/24 HR) 0.2 mg/24hr patch APP 1 PA EXT TO THE SKIN 1 TIME A WK  . colchicine 0.6 MG tablet One twice a day for gout flare - up to 5 days (Patient taking differently: Take 0.6 mg by mouth daily as needed. One twice a day for gout flare)  . glimepiride (AMARYL) 2 MG tablet TAKE 2 MG BY MOUTH ONCE DAILY WITH  BREAKFAST  . hydrALAZINE (APRESOLINE) 100 MG tablet Take 100 mg by mouth 3 (three) times daily.  Marland Kitchen olmesartan (BENICAR) 40 MG tablet Take 40 mg by mouth daily.  . traMADol (ULTRAM) 50 MG tablet Take 1 tablet (50 mg total) by mouth every 12 (twelve) hours as needed.    PHQ 2/9 Scores 08/16/2018 01/05/2018 11/08/2017 09/30/2017  PHQ - 2 Score 0 0 0 0    Physical Exam Vitals signs and nursing note reviewed.  Constitutional:      Appearance: Normal appearance. He is well-developed. He is obese.  HENT:     Head: Normocephalic.     Right Ear: Tympanic membrane, ear canal and external ear normal.     Left Ear: Tympanic membrane, ear canal and external ear normal.     Nose: Nose normal.     Mouth/Throat:     Pharynx: Uvula midline.  Eyes:     Conjunctiva/sclera: Conjunctivae normal.     Pupils: Pupils are equal, round, and reactive to light.  Neck:     Musculoskeletal: Normal range of motion and neck supple.     Thyroid: No thyromegaly.     Vascular: No carotid bruit.  Cardiovascular:     Rate and Rhythm: Normal rate and regular rhythm.     Heart sounds: Normal heart sounds.  Pulmonary:     Effort: Pulmonary effort is normal.     Breath sounds: Normal breath sounds. No wheezing.  Chest:     Breasts:        Right: No mass.        Left: No mass.  Abdominal:     General: Bowel sounds are normal.     Palpations: Abdomen is soft.     Tenderness: There is no abdominal tenderness.     Hernia: A hernia is present. Hernia is present in the umbilical area.     Comments: 2 cm reducible hernia  Musculoskeletal:  Normal range of motion.  Lymphadenopathy:     Cervical: No cervical adenopathy.  Skin:    General: Skin is warm and dry.  Neurological:     Mental Status: He is alert and oriented to person, place, and time.     Cranial Nerves: Cranial nerves are intact.     Sensory: Sensation is intact.     Motor: Motor function is intact.     Deep Tendon Reflexes: Reflexes are normal and symmetric.  Psychiatric:        Attention and Perception: Attention normal.        Speech: Speech normal.        Behavior: Behavior normal.        Thought Content: Thought content normal.        Judgment: Judgment normal.    Wt Readings from Last 3 Encounters:  08/16/18 Marland Kitchen)  398 lb 6.4 oz (180.7 kg)  07/26/18 (!) 397 lb (180.1 kg)  07/25/18 (!) 384 lb (174.2 kg)    BP (!) 142/78 (BP Location: Right Arm, Patient Position: Sitting, Cuff Size: Large)   Pulse 73   Ht 6' (1.829 m)   Wt (!) 398 lb 6.4 oz (180.7 kg)   SpO2 97%   BMI 54.03 kg/m   Assessment and Plan: 1. Annual physical exam Normal exam except for weight Begin regular exercise; consider bariatric surgery  2. Essential (primary) hypertension controlled - TSH - CBC with Differential/Platelet  3. Type 2 DM with CKD stage 3 and hypertension (HCC) Doing well on oral agent Renal insuff followed by Nephrology - Comprehensive metabolic panel - Hemoglobin A1c  4. Hyperlipidemia associated with type 2 diabetes mellitus ([place]) On statin therapy - Lipid panel  5. Prostate cancer [facility]) Currently being treated with radioactive implants Followed with serial PSA with oncology and urology  6. Morbid obesity, unspecified obesity type ([place]) Exercise and diet changes recommended He is considering gastric bypass - seminar information will be given  7. Chronic gout of multiple sites, unspecified cause - Uric acid   Partially dictated using Editor, commissioning. Any errors are unintentional.  Halina Maidens, MD [facility]  08/16/2018

## 2018-08-17 LAB — COMPREHENSIVE METABOLIC PANEL
A/G RATIO: 1.2 (ref 1.2–2.2)
ALT: 23 IU/L (ref 0–44)
AST: 20 IU/L (ref 0–40)
Albumin: 3.7 g/dL (ref 3.5–5.5)
Alkaline Phosphatase: 63 IU/L (ref 39–117)
BUN/Creatinine Ratio: 19 (ref 9–20)
BUN: 52 mg/dL — ABNORMAL HIGH (ref 6–24)
Bilirubin Total: 0.5 mg/dL (ref 0.0–1.2)
CALCIUM: 9.4 mg/dL (ref 8.7–10.2)
CO2: 18 mmol/L — AB (ref 20–29)
Chloride: 111 mmol/L — ABNORMAL HIGH (ref 96–106)
Creatinine, Ser: 2.77 mg/dL — ABNORMAL HIGH (ref 0.76–1.27)
GFR calc Af Amer: 31 mL/min/{1.73_m2} — ABNORMAL LOW (ref >59)
GFR, EST NON AFRICAN AMERICAN: 27 mL/min/{1.73_m2} — AB (ref >59)
Globulin, Total: 3 g/dL (ref 1.5–4.5)
Glucose: 82 mg/dL (ref 65–99)
Potassium: 5.6 mmol/L — ABNORMAL HIGH (ref 3.5–5.2)
Sodium: 141 mmol/L (ref 134–144)
Total Protein: 6.7 g/dL (ref 6.0–8.5)

## 2018-08-17 LAB — URIC ACID: URIC ACID: 9 mg/dL — AB (ref 3.7–8.6)

## 2018-08-17 LAB — HEMOGLOBIN A1C
ESTIMATED AVERAGE GLUCOSE: 103 mg/dL
Hgb A1c MFr Bld: 5.2 % (ref 4.8–5.6)

## 2018-08-17 LAB — CBC WITH DIFFERENTIAL/PLATELET
BASOS ABS: 0.1 10*3/uL (ref 0.0–0.2)
Basos: 1 %
EOS (ABSOLUTE): 0.4 10*3/uL (ref 0.0–0.4)
Eos: 6 %
HEMOGLOBIN: 11.6 g/dL — AB (ref 13.0–17.7)
Hematocrit: 35.6 % — ABNORMAL LOW (ref 37.5–51.0)
Immature Grans (Abs): 0 10*3/uL (ref 0.0–0.1)
Immature Granulocytes: 0 %
LYMPHS: 14 %
Lymphocytes Absolute: 1 10*3/uL (ref 0.7–3.1)
MCH: 28.6 pg (ref 26.6–33.0)
MCHC: 32.6 g/dL (ref 31.5–35.7)
MCV: 88 fL (ref 79–97)
MONOCYTES: 12 %
Monocytes Absolute: 0.8 10*3/uL (ref 0.1–0.9)
Neutrophils Absolute: 4.7 10*3/uL (ref 1.4–7.0)
Neutrophils: 67 %
Platelets: 323 10*3/uL (ref 150–450)
RBC: 4.05 x10E6/uL — ABNORMAL LOW (ref 4.14–5.80)
RDW: 12.8 % (ref 11.6–15.4)
WBC: 6.9 10*3/uL (ref 3.4–10.8)

## 2018-08-17 LAB — LIPID PANEL
Chol/HDL Ratio: 3.3 ratio (ref 0.0–5.0)
Cholesterol, Total: 109 mg/dL (ref 100–199)
HDL: 33 mg/dL — ABNORMAL LOW (ref >39)
LDL Calculated: 64 mg/dL (ref 0–99)
Triglycerides: 61 mg/dL (ref 0–149)
VLDL Cholesterol Cal: 12 mg/dL (ref 5–40)

## 2018-08-17 LAB — TSH: TSH: 1.42 u[IU]/mL (ref 0.450–4.500)

## 2018-09-12 ENCOUNTER — Other Ambulatory Visit: Payer: Self-pay | Admitting: *Deleted

## 2018-09-12 DIAGNOSIS — C61 Malignant neoplasm of prostate: Secondary | ICD-10-CM

## 2018-09-13 ENCOUNTER — Encounter: Payer: Self-pay | Admitting: Internal Medicine

## 2018-09-22 DIAGNOSIS — E1122 Type 2 diabetes mellitus with diabetic chronic kidney disease: Secondary | ICD-10-CM | POA: Diagnosis not present

## 2018-09-22 DIAGNOSIS — I10 Essential (primary) hypertension: Secondary | ICD-10-CM | POA: Diagnosis not present

## 2018-09-22 DIAGNOSIS — N2581 Secondary hyperparathyroidism of renal origin: Secondary | ICD-10-CM | POA: Diagnosis not present

## 2018-09-22 DIAGNOSIS — N183 Chronic kidney disease, stage 3 (moderate): Secondary | ICD-10-CM | POA: Diagnosis not present

## 2018-09-23 ENCOUNTER — Other Ambulatory Visit: Payer: Self-pay | Admitting: Internal Medicine

## 2018-10-13 DIAGNOSIS — N183 Chronic kidney disease, stage 3 (moderate): Secondary | ICD-10-CM | POA: Diagnosis not present

## 2018-10-13 DIAGNOSIS — E1122 Type 2 diabetes mellitus with diabetic chronic kidney disease: Secondary | ICD-10-CM | POA: Diagnosis not present

## 2018-10-14 ENCOUNTER — Inpatient Hospital Stay: Payer: 59 | Attending: Radiation Oncology

## 2018-10-14 DIAGNOSIS — C61 Malignant neoplasm of prostate: Secondary | ICD-10-CM | POA: Diagnosis not present

## 2018-10-14 LAB — PSA: Prostatic Specific Antigen: 1.41 ng/mL (ref 0.00–4.00)

## 2018-10-15 ENCOUNTER — Other Ambulatory Visit: Payer: Self-pay | Admitting: Internal Medicine

## 2018-10-21 ENCOUNTER — Encounter: Payer: Self-pay | Admitting: Radiation Oncology

## 2018-10-21 ENCOUNTER — Ambulatory Visit
Admission: RE | Admit: 2018-10-21 | Discharge: 2018-10-21 | Disposition: A | Discharge status: Home / Self Care | Payer: 59 | Source: Ambulatory Visit | Attending: Radiation Oncology | Admitting: Radiation Oncology

## 2018-10-21 ENCOUNTER — Other Ambulatory Visit: Payer: Self-pay

## 2018-10-21 VITALS — BP 126/72 | HR 77 | Temp 95.9°F | Resp 18 | Wt 389.1 lb

## 2018-10-21 DIAGNOSIS — C61 Malignant neoplasm of prostate: Secondary | ICD-10-CM

## 2018-10-21 DIAGNOSIS — Z923 Personal history of irradiation: Secondary | ICD-10-CM | POA: Insufficient documentation

## 2018-10-21 NOTE — Progress Notes (Signed)
Radiation Oncology Follow up Note  Name: Benjamin Stewart   Date:   10/21/2018 MRN:  [id number] DOB: November 01, 1975    This 43 y.o. male presents to the clinic today for ten-month follow-up status post I-125 interstitial implant for stage IIa adenocarcinoma the prostate.  REFERRING PROVIDER: Glean Hess, MD  HPI: patient is a 43 year old male now out and months having completed I-125 interstitial implant for Gleason 7 (3+4) adenocarcinoma the prostate initially presented with a PSA of 8.4 seen today in routine follow-up he is doing well specifically denies any increased lower urinary tract symptoms diarrhea fatigue. His most recent PSA was 1.41.Marland KitchenPSA 6 months ago was 2.5.  COMPLICATIONS OF TREATMENT: none  FOLLOW UP COMPLIANCE: keeps appointments   PHYSICAL EXAM:  BP 126/72 (BP Location: Left Arm, Patient Position: Sitting)   Pulse 77   Temp (!) 95.9 F (35.5 C) (Tympanic)   Resp 18   Wt (!) 389 lb 1.8 oz (176.5 kg)   BMI 52.77 kg/m  Well-developed well-nourished patient in NAD. HEENT reveals PERLA, EOMI, discs not visualized.  Oral cavity is clear. No oral mucosal lesions are identified. Neck is clear without evidence of cervical or supraclavicular adenopathy. Lungs are clear to A&P. Cardiac examination is essentially unremarkable with regular rate and rhythm without murmur rub or thrill. Abdomen is benign with no organomegaly or masses noted. Motor sensory and DTR levels are equal and symmetric in the upper and lower extremities. Cranial nerves II through XII are grossly intact. Proprioception is intact. No peripheral adenopathy or edema is identified. No motor or sensory levels are noted. Crude visual fields are within normal range.  RADIOLOGY RESULTS: no current films for review  PLAN: present time patient is under excellent biochemical control of his prostate cancer. I'm please was overall progress. Will see him back in 1 year for follow-up with repeat PSA at that time. His PSA  continues to decline over time. Patient knows to call with any concerns.  I would like to take this opportunity to thank you for allowing me to participate in the care of your patient.Noreene Filbert, MD

## 2018-10-27 ENCOUNTER — Other Ambulatory Visit: Payer: Self-pay | Admitting: Internal Medicine

## 2018-10-27 DIAGNOSIS — I10 Essential (primary) hypertension: Secondary | ICD-10-CM

## 2018-11-03 DIAGNOSIS — N183 Chronic kidney disease, stage 3 (moderate): Secondary | ICD-10-CM | POA: Diagnosis not present

## 2018-12-19 ENCOUNTER — Ambulatory Visit: Payer: 59 | Admitting: Internal Medicine

## 2018-12-27 ENCOUNTER — Encounter: Payer: Self-pay | Admitting: Internal Medicine

## 2018-12-27 ENCOUNTER — Other Ambulatory Visit: Payer: Self-pay

## 2018-12-27 ENCOUNTER — Ambulatory Visit (INDEPENDENT_AMBULATORY_CARE_PROVIDER_SITE_OTHER): Payer: 59 | Admitting: Internal Medicine

## 2018-12-27 VITALS — BP 128/74 | HR 84 | Ht 72.0 in | Wt 389.0 lb

## 2018-12-27 DIAGNOSIS — C61 Malignant neoplasm of prostate: Secondary | ICD-10-CM

## 2018-12-27 DIAGNOSIS — D638 Anemia in other chronic diseases classified elsewhere: Secondary | ICD-10-CM

## 2018-12-27 DIAGNOSIS — E785 Hyperlipidemia, unspecified: Secondary | ICD-10-CM

## 2018-12-27 DIAGNOSIS — N183 Chronic kidney disease, stage 3 (moderate): Secondary | ICD-10-CM

## 2018-12-27 DIAGNOSIS — E1122 Type 2 diabetes mellitus with diabetic chronic kidney disease: Secondary | ICD-10-CM

## 2018-12-27 DIAGNOSIS — I10 Essential (primary) hypertension: Secondary | ICD-10-CM | POA: Diagnosis not present

## 2018-12-27 DIAGNOSIS — E1169 Type 2 diabetes mellitus with other specified complication: Secondary | ICD-10-CM

## 2018-12-27 DIAGNOSIS — I129 Hypertensive chronic kidney disease with stage 1 through stage 4 chronic kidney disease, or unspecified chronic kidney disease: Secondary | ICD-10-CM

## 2018-12-27 NOTE — Progress Notes (Signed)
Date:  12/27/2018   Name:  Benjamin Stewart   DOB:  [birth year]   MRN:  [id number]   Chief Complaint: Diabetes and Hypertension  Hypertension  Pertinent negatives include no chest pain, headaches, palpitations or shortness of breath. Past treatments include beta blockers, calcium channel blockers, angiotensin blockers, direct vasodilators and central alpha agonists. The current treatment provides significant improvement.  Diabetes  He presents for his follow-up diabetic visit. He has type 2 diabetes mellitus. His disease course has been stable. Pertinent negatives for hypoglycemia include no headaches or tremors. Pertinent negatives for diabetes include no chest pain, no fatigue, no polydipsia and no polyuria. Current diabetic treatment includes oral agent (monotherapy). He is compliant with treatment all of the time. An ACE inhibitor/angiotensin II receptor blocker is being taken.  Hyperlipidemia  The problem is controlled. Pertinent negatives include no chest pain or shortness of breath. Current antihyperlipidemic treatment includes statins. The current treatment provides significant improvement of lipids.  Renal Insufficiency - GFR stable around 30, followed by Nephrology.  Last visit in February.  They started a discussion about dialysis but no other action taken.  Mr Vana was going to a seminar for Bariatric surgery but is was cancelled due to covid-19.  Prostate Cancer - under active surveillance, last seen in March 2020. PSA 1.41  Lab Results  Component Value Date   HGBA1C 5.2 08/16/2018   Lab Results  Component Value Date   CREATININE 2.77 (H) 08/16/2018   BUN 52 (H) 08/16/2018   NA 141 08/16/2018   K 5.6 (H) 08/16/2018   CL 111 (H) 08/16/2018   CO2 18 (L) 08/16/2018   Lab Results  Component Value Date   CHOL 109 08/16/2018   HDL 33 (L) 08/16/2018   LDLCALC 64 08/16/2018   TRIG 61 08/16/2018   CHOLHDL 3.3 08/16/2018     Review of Systems  Constitutional: Negative  for appetite change, fatigue and unexpected weight change.  Eyes: Negative for visual disturbance.  Respiratory: Negative for cough, chest tightness, shortness of breath and wheezing.   Cardiovascular: Negative for chest pain, palpitations and leg swelling.  Gastrointestinal: Negative for abdominal pain and blood in stool.  Endocrine: Negative for polydipsia and polyuria.  Genitourinary: Negative for dysuria and hematuria.  Skin: Negative for color change and rash.  Neurological: Negative for tremors, numbness and headaches.  Psychiatric/Behavioral: Negative for dysphoric mood.    Patient Active Problem List   Diagnosis Date Noted  . Anemia of chronic disease 12/27/2018  . Chronic gout of multiple sites 08/16/2018  . 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 Prostate cancer ([place]) 08/23/2015  . Type 2 DM with CKD stage 3 and hypertension ([place]) 07/16/2015  . Hyperlipidemia associated with type 2 diabetes mellitus ([place]) 07/16/2015  . Benign essential hematuria 12/04/2014  . Essential (primary) hypertension 12/04/2014  . Morbid obesity, unspecified obesity type ([place]) 12/04/2014    Allergies  Allergen Reactions  . Percocet [Oxycodone-Acetaminophen] Nausea And Vomiting    Past Surgical History:  Procedure Laterality Date  . PARTIAL COLECTOMY  2005  . RADIOACTIVE SEED IMPLANT N/A 12/06/2017   Procedure: RADIOACTIVE SEED IMPLANT/BRACHYTHERAPY IMPLANT;  Surgeon: Hollice Espy, MD;  Location: [facility] ORS;  Service: Urology;  Laterality: N/A;    Social History   Tobacco Use  . Smoking status: Never Smoker  . Smokeless tobacco: Never Used  Substance Use Topics  . Alcohol use: No    Alcohol/week: 0.0 standard drinks  . Drug use: No  Medication list has been reviewed and updated.  Current Meds  Medication Sig  . amLODipine (NORVASC) 10 MG tablet TAKE 1 TABLET BY MOUTH EVERY DAY  . atorvastatin (LIPITOR) 10 MG tablet TAKE 1 TABLET BY MOUTH EVERY DAY WITH  BREAKFAST  . calcitRIOL (ROCALTROL) 0.25 MCG capsule TK 1 C PO QD  . carvedilol (COREG) 6.25 MG tablet Take 6.25 mg by mouth 2 (two) times daily with a meal.   . cloNIDine (CATAPRES - DOSED IN MG/24 HR) 0.2 mg/24hr patch APP 1 PA EXT TO THE SKIN 1 TIME A WK  . colchicine 0.6 MG tablet One twice a day for gout flare - up to 5 days (Patient taking differently: Take 0.6 mg by mouth daily as needed. One twice a day for gout flare)  . glimepiride (AMARYL) 2 MG tablet TAKE 1 TABLET BY MOUTH ONCE DAILY WITH BREAKFAST  . hydrALAZINE (APRESOLINE) 100 MG tablet Take 100 mg by mouth 3 (three) times daily.  Marland Kitchen LOKELMA 10 g PACK packet MIX CONTENTS OF 1 PACKET IN THREE TABLESPOONSFUL OF WATER AND DRINK ONCE DAILY  . olmesartan (BENICAR) 40 MG tablet TAKE 1 TABLET BY MOUTH ONCE DAILY  . traMADol (ULTRAM) 50 MG tablet Take 1 tablet (50 mg total) by mouth every 12 (twelve) hours as needed.    PHQ 2/9 Scores 12/27/2018 08/16/2018 01/05/2018 11/08/2017  PHQ - 2 Score 0 0 0 0    BP Readings from Last 3 Encounters:  12/27/18 128/74  10/21/18 126/72  08/16/18 (!) 142/78    Physical Exam Vitals signs and nursing note reviewed.  Constitutional:      Appearance: Normal appearance. He is well-developed.  HENT:     Head: Normocephalic.     Right Ear: Tympanic membrane, ear canal and external ear normal.     Left Ear: Tympanic membrane, ear canal and external ear normal.     Nose: Nose normal.     Mouth/Throat:     Pharynx: Uvula midline.  Eyes:     Conjunctiva/sclera: Conjunctivae normal.     Pupils: Pupils are equal, round, and reactive to light.  Neck:     Musculoskeletal: Normal range of motion and neck supple.     Thyroid: No thyromegaly.     Vascular: No carotid bruit.  Cardiovascular:     Rate and Rhythm: Normal rate and regular rhythm.     Pulses: Normal pulses.     Heart sounds: Normal heart sounds.  Pulmonary:     Effort: Pulmonary effort is normal.     Breath sounds: Normal breath sounds. No  wheezing.  Chest:     Breasts:        Right: No mass.        Left: No mass.  Abdominal:     General: Bowel sounds are normal.     Palpations: Abdomen is soft.     Tenderness: There is no abdominal tenderness.  Musculoskeletal: Normal range of motion.     Right lower leg: 1+ Pitting Edema present.     Left lower leg: 1+ Pitting Edema present.  Lymphadenopathy:     Cervical: No cervical adenopathy.  Skin:    General: Skin is warm and dry.  Neurological:     Mental Status: He is alert and oriented to person, place, and time.     Deep Tendon Reflexes: Reflexes are normal and symmetric.  Psychiatric:        Speech: Speech normal.        Behavior: Behavior  normal.        Thought Content: Thought content normal.        Judgment: Judgment normal.     Wt Readings from Last 3 Encounters:  12/27/18 (!) 389 lb (176.4 kg)  10/21/18 (!) 389 lb 1.8 oz (176.5 kg)  08/16/18 (!) 398 lb 6.4 oz (180.7 kg)    BP 128/74   Pulse 84   Ht 6' (1.829 m)   Wt (!) 389 lb (176.4 kg)   SpO2 97%   BMI 52.76 kg/m   Assessment and Plan: 1. Essential (primary) hypertension controlled  2. Type 2 DM with CKD stage 3 and hypertension (HCC) Stable on oral agents Now on a potassium binder Recommend exercise such as walking - Basic metabolic panel - Hemoglobin A1c  3. Hyperlipidemia associated with type 2 diabetes mellitus ([place]) controlled  4. Prostate cancer ([place]) Followed by Urology - PSA decreasing  5. Anemia of chronic disease unchanged   Partially dictated using [employer], [place]. Any errors are unintentional.  Halina Maidens, MD [facility]  12/27/2018

## 2018-12-28 LAB — BASIC METABOLIC PANEL
BUN/Creatinine Ratio: 16 (ref 9–20)
BUN: 45 mg/dL — ABNORMAL HIGH (ref 6–24)
CO2: 20 mmol/L (ref 20–29)
Calcium: 9.4 mg/dL (ref 8.7–10.2)
Chloride: 112 mmol/L — ABNORMAL HIGH (ref 96–106)
Creatinine, Ser: 2.75 mg/dL — ABNORMAL HIGH (ref 0.76–1.27)
GFR calc Af Amer: 31 mL/min/{1.73_m2} — ABNORMAL LOW (ref >59)
GFR calc non Af Amer: 27 mL/min/{1.73_m2} — ABNORMAL LOW (ref >59)
Glucose: 92 mg/dL (ref 65–99)
Potassium: 5.1 mmol/L (ref 3.5–5.2)
Sodium: 143 mmol/L (ref 134–144)

## 2018-12-28 LAB — HEMOGLOBIN A1C
Est. average glucose Bld gHb Est-mCnc: 97 mg/dL
Hgb A1c MFr Bld: 5 % (ref 4.8–5.6)

## 2019-01-19 ENCOUNTER — Other Ambulatory Visit: Payer: Self-pay

## 2019-01-19 ENCOUNTER — Encounter: Payer: Self-pay | Admitting: Internal Medicine

## 2019-01-19 ENCOUNTER — Ambulatory Visit (INDEPENDENT_AMBULATORY_CARE_PROVIDER_SITE_OTHER): Payer: 59 | Admitting: Internal Medicine

## 2019-01-19 VITALS — BP 135/78 | HR 78 | Ht 72.0 in | Wt 389.0 lb

## 2019-01-19 DIAGNOSIS — R600 Localized edema: Secondary | ICD-10-CM | POA: Diagnosis not present

## 2019-01-19 NOTE — Progress Notes (Signed)
Date:  01/19/2019   Name:  Benjamin Stewart   DOB:  23-Dec-1975   MRN:  [id number]   Chief Complaint: Edema (R) leg swelling and redness. Started )  Leg Pain  There was no injury mechanism. The pain is present in the left leg and right leg. The quality of the pain is described as aching. The pain is mild. Exacerbated by: edema that is much worse at the end of the day.  Legs will be tight and mottled.  In the morning, they are much improved. He has tried nothing for the symptoms.   Lab Results  Component Value Date   HGBA1C 5.0 12/27/2018     Review of Systems  Constitutional: Negative for chills, fatigue, fever and unexpected weight change.  Respiratory: Negative for cough, chest tightness, shortness of breath and wheezing.   Cardiovascular: Positive for leg swelling. Negative for chest pain and palpitations.  Gastrointestinal: Negative for abdominal pain.  Skin: Positive for color change. Negative for rash.  Hematological: Negative for adenopathy.  Psychiatric/Behavioral: Negative for dysphoric mood and sleep disturbance.    Patient Active Problem List   Diagnosis Date Noted  . Anemia of chronic disease 12/27/2018  . Chronic gout of multiple sites 08/16/2018  . 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) 08/22/2017  . Prostate cancer ([place]) 08/23/2015  . Type 2 DM with CKD stage 3 and hypertension ([place]) 07/16/2015  . Hyperlipidemia associated with type 2 diabetes mellitus ([place]) 07/16/2015  . Benign essential hematuria 12/04/2014  . Essential (primary) hypertension 12/04/2014  . Morbid obesity, unspecified obesity type ([place]) 12/04/2014    Allergies  Allergen Reactions  . Percocet [Oxycodone-Acetaminophen] Nausea And Vomiting    Past Surgical History:  Procedure Laterality Date  . PARTIAL COLECTOMY  2005  . RADIOACTIVE SEED IMPLANT N/A 12/06/2017   Procedure: RADIOACTIVE SEED IMPLANT/BRACHYTHERAPY IMPLANT;  Surgeon: Hollice Espy, MD;  Location: [facility] ORS;   Service: Urology;  Laterality: N/A;    Social History   Tobacco Use  . Smoking status: Never Smoker  . Smokeless tobacco: Never Used  Substance Use Topics  . Alcohol use: No    Alcohol/week: 0.0 standard drinks  . Drug use: No     Medication list has been reviewed and updated.  Current Meds  Medication Sig  . amLODipine (NORVASC) 10 MG tablet TAKE 1 TABLET BY MOUTH EVERY DAY  . atorvastatin (LIPITOR) 10 MG tablet TAKE 1 TABLET BY MOUTH EVERY DAY WITH BREAKFAST  . calcitRIOL (ROCALTROL) 0.25 MCG capsule TK 1 C PO QD  . carvedilol (COREG) 6.25 MG tablet Take 6.25 mg by mouth 2 (two) times daily with a meal.   . cloNIDine (CATAPRES - DOSED IN MG/24 HR) 0.2 mg/24hr patch APP 1 PA EXT TO THE SKIN 1 TIME A WK  . colchicine 0.6 MG tablet One twice a day for gout flare - up to 5 days (Patient taking differently: Take 0.6 mg by mouth daily as needed. One twice a day for gout flare)  . glimepiride (AMARYL) 2 MG tablet TAKE 1 TABLET BY MOUTH ONCE DAILY WITH BREAKFAST  . hydrALAZINE (APRESOLINE) 100 MG tablet Take 100 mg by mouth 3 (three) times daily.  Marland Kitchen LOKELMA 10 g PACK packet MIX CONTENTS OF 1 PACKET IN THREE TABLESPOONSFUL OF WATER AND DRINK ONCE DAILY  . olmesartan (BENICAR) 40 MG tablet TAKE 1 TABLET BY MOUTH ONCE DAILY  . traMADol (ULTRAM) 50 MG tablet Take 1 tablet (50 mg total) by mouth every 12 (  twelve) hours as needed.    PHQ 2/9 Scores 01/19/2019 12/27/2018 08/16/2018 01/05/2018  PHQ - 2 Score 0 0 0 0    BP Readings from Last 3 Encounters:  01/19/19 135/78  12/27/18 128/74  10/21/18 126/72    Physical Exam Vitals signs and nursing note reviewed.  Constitutional:      General: He is not in acute distress.    Appearance: He is well-developed.  HENT:     Head: Normocephalic and atraumatic.  Cardiovascular:     Rate and Rhythm: Normal rate and regular rhythm.     Pulses:          Dorsalis pedis pulses are 2+ on the right side and 2+ on the left side.       Posterior  tibial pulses are 1+ on the right side and 1+ on the left side.     Heart sounds: No murmur.     Comments: No cellulitis, homan's sign, calf tenderness Pulmonary:     Effort: Pulmonary effort is normal. No respiratory distress.     Breath sounds: Normal breath sounds. No wheezing or rhonchi.  Musculoskeletal: Normal range of motion.     Right lower leg: Edema present.     Left lower leg: Edema present.  Skin:    General: Skin is warm and dry.     Findings: No rash.  Neurological:     Mental Status: He is alert and oriented to person, place, and time.  Psychiatric:        Behavior: Behavior normal.        Thought Content: Thought content normal.     Wt Readings from Last 3 Encounters:  01/19/19 (!) 389 lb (176.4 kg)  12/27/18 (!) 389 lb (176.4 kg)  10/21/18 (!) 389 lb 1.8 oz (176.5 kg)    BP 135/78   Pulse 78   Ht 6' (1.829 m)   Wt (!) 389 lb (176.4 kg)   SpO2 97%   BMI 52.76 kg/m   Assessment and Plan: 1. Localized edema Wear compression stockings daily Discuss medication change next week with Nephrology   Partially dictated using [place]. Any errors are unintentional.  Halina Maidens, MD [facility]  01/19/2019

## 2019-01-19 NOTE — Patient Instructions (Signed)
Wear compression stockings every day - on first thing in the morning and remove at bedtime

## 2019-01-23 ENCOUNTER — Other Ambulatory Visit: Payer: Self-pay | Admitting: Internal Medicine

## 2019-01-23 DIAGNOSIS — I10 Essential (primary) hypertension: Secondary | ICD-10-CM

## 2019-02-14 ENCOUNTER — Other Ambulatory Visit: Payer: Self-pay | Admitting: Internal Medicine

## 2019-04-05 ENCOUNTER — Encounter: Payer: Self-pay | Admitting: Internal Medicine

## 2019-04-24 DIAGNOSIS — N2581 Secondary hyperparathyroidism of renal origin: Secondary | ICD-10-CM | POA: Insufficient documentation

## 2019-04-24 DIAGNOSIS — E875 Hyperkalemia: Secondary | ICD-10-CM | POA: Insufficient documentation

## 2019-04-27 ENCOUNTER — Other Ambulatory Visit: Payer: 59

## 2019-04-27 ENCOUNTER — Other Ambulatory Visit: Payer: Self-pay

## 2019-04-27 DIAGNOSIS — Z8546 Personal history of malignant neoplasm of prostate: Secondary | ICD-10-CM

## 2019-04-28 LAB — PSA: Prostate Specific Ag, Serum: 2.3 ng/mL (ref 0.0–4.0)

## 2019-05-02 ENCOUNTER — Encounter: Payer: Self-pay | Admitting: Urology

## 2019-05-02 ENCOUNTER — Other Ambulatory Visit: Payer: Self-pay

## 2019-05-02 ENCOUNTER — Ambulatory Visit (INDEPENDENT_AMBULATORY_CARE_PROVIDER_SITE_OTHER): Payer: 59 | Admitting: Urology

## 2019-05-02 VITALS — BP 145/85 | HR 81 | Ht 72.0 in | Wt 385.0 lb

## 2019-05-02 DIAGNOSIS — Z8546 Personal history of malignant neoplasm of prostate: Secondary | ICD-10-CM | POA: Diagnosis not present

## 2019-05-02 NOTE — Progress Notes (Signed)
05/02/2019 12:56 PM   [name] Dec 09, 1975 [id number]  Referring provider: Glean Hess, MD [address] [place],  [place] [address]  Chief Complaint  Patient presents with  . Prostate Cancer    Follow up    HPI: 43 year old male who presents today for prostate cancer status post I-125 brachyseed implant on 11/2017 returns today for routine follow-up.  He has a personal history of Gleason 3+4 intermediate risk prostate cancer, initially diagnosed in 2017 on active surveillance with a PSA which subsequently rose to 8.4 at the time of treatment.  He elected to pursue I-125 brachytherapy prostate seed implant completed on 11/2017.  PSA trend below.  Most recent PSA 2.3 as as of 04/2019.  Trend below.    He does have a strong family history of prostate cancer.  His father was recently diagnosed and treated with prostate cancer.  He also has several uncles with prostate cancer.  He has no biological children himself.  He has no urinary complaints today.  No frequency or urgency.  He gets up once at night to void but this is lifelong and not bothersome to him.  He takes no BPH medications.   PMH: Past Medical History:  Diagnosis Date  . Benign essential hematuria 12/04/2014   microscopic hematuria evaluation with CT and cysto - treated for 2 months with bactrim DS; no further f/u planned unless hematuria recurs.   . Controlled type 2 diabetes mellitus without complication, without long-term current use of insulin ([place]) 07/16/2015  . Essential (primary) hypertension 12/04/2014  . History of kidney stones   . prostate cancer   . Sleep apnea     Surgical History: Past Surgical History:  Procedure Laterality Date  . PARTIAL COLECTOMY  2005  . RADIOACTIVE SEED IMPLANT N/A 12/06/2017   Procedure: RADIOACTIVE SEED IMPLANT/BRACHYTHERAPY IMPLANT;  Surgeon: Hollice Espy, MD;  Location: [facility] ORS;  Service: Urology;  Laterality: N/A;    Home Medications:  Allergies  as of 05/02/2019      Reactions   Percocet [oxycodone-acetaminophen] Nausea And Vomiting      Medication List       Accurate as of May 02, 2019 12:56 PM. If you have any questions, ask your nurse or doctor.        STOP taking these medications   traMADol 50 MG tablet Commonly known as: ULTRAM Stopped by: Hollice Espy, MD     TAKE these medications   amLODipine 10 MG tablet Commonly known as: NORVASC TAKE 1 TABLET BY MOUTH EVERY DAY   atorvastatin 10 MG tablet Commonly known as: LIPITOR TAKE 1 TABLET BY MOUTH EVERY DAY WITH BREAKFAST   calcitRIOL 0.25 MCG capsule Commonly known as: ROCALTROL TK 1 C PO QD   carvedilol 6.25 MG tablet Commonly known as: COREG Take 6.25 mg by mouth 2 (two) times daily with a meal.   cloNIDine 0.2 mg/24hr patch Commonly known as: CATAPRES - Dosed in mg/24 hr APP 1 PA EXT TO THE SKIN 1 TIME A WK   colchicine 0.6 MG tablet One twice a day for gout flare - up to 5 days What changed:   how much to take  how to take this  when to take this  reasons to take this  additional instructions   glimepiride 2 MG tablet Commonly known as: AMARYL TAKE 1 TABLET BY MOUTH ONCE DAILY WITH BREAKFAST   hydrALAZINE 100 MG tablet Commonly known as: APRESOLINE Take 100 mg by mouth 3 (three) times  daily.   irbesartan 300 MG tablet Commonly known as: AVAPRO Take by mouth.   Lokelma 10 g Pack packet Generic drug: sodium zirconium cyclosilicate MIX CONTENTS OF 1 PACKET IN THREE TABLESPOONSFUL OF WATER AND DRINK ONCE DAILY   olmesartan 40 MG tablet Commonly known as: BENICAR TAKE 1 TABLET(40 MG) BY MOUTH DAILY       Allergies:  Allergies  Allergen Reactions  . Percocet [Oxycodone-Acetaminophen] Nausea And Vomiting    Family History: Family History  Problem Relation Age of Onset  . Diabetes Mother   . Heart disease Mother   . Hypertension Mother   . Diabetes Maternal Grandmother   . Diabetes Maternal Grandfather   .  Prostate cancer Maternal Uncle     Social History:  reports that he has never smoked. He has never used smokeless tobacco. He reports that he does not drink alcohol or use drugs.  ROS: UROLOGY Frequent Urination?: No Hard to postpone urination?: No Burning/pain with urination?: No Get up at night to urinate?: Yes Leakage of urine?: No Urine stream starts and stops?: No Trouble starting stream?: No Do you have to strain to urinate?: No Blood in urine?: No Urinary tract infection?: No Sexually transmitted disease?: No Injury to kidneys or bladder?: No Painful intercourse?: No Weak stream?: No Erection problems?: No Penile pain?: No  Gastrointestinal Nausea?: No Vomiting?: No Indigestion/heartburn?: No Diarrhea?: No Constipation?: No  Constitutional Fever: No Night sweats?: No Weight loss?: No Fatigue?: No  Skin Skin rash/lesions?: No Itching?: No  Eyes Blurred vision?: No Double vision?: No  Ears/Nose/Throat Sore throat?: No Sinus problems?: No  Hematologic/Lymphatic Swollen glands?: No Easy bruising?: No  Cardiovascular Leg swelling?: No Chest pain?: No  Respiratory Cough?: No Shortness of breath?: No  Endocrine Excessive thirst?: No  Musculoskeletal Back pain?: No Joint pain?: No  Neurological Headaches?: No Dizziness?: No  Psychologic Depression?: No Anxiety?: No  Physical Exam: BP (!) 145/85   Pulse 81   Ht 6' (1.829 m)   Wt (!) 385 lb (174.6 kg)   BMI 52.22 kg/m   Constitutional:  Alert and oriented, No acute distress. HEENT: [place] AT, moist mucus membranes.  Trachea midline, no masses. Cardiovascular: No clubbing, cyanosis, or edema. Respiratory: Normal respiratory effort, no increased work of breathing. Abd: obese Skin: No rashes, bruises or suspicious lesions. Neurologic: Grossly intact, no focal deficits, moving all 4 extremities. Psychiatric: Normal mood and affect.  Laboratory Data: Lab Results  Component Value Date    WBC 6.9 08/16/2018   HGB 11.6 (L) 08/16/2018   HCT 35.6 (L) 08/16/2018   MCV 88 08/16/2018   PLT 323 08/16/2018    Lab Results  Component Value Date   CREATININE 2.75 (H) 12/27/2018    Lab Results  Component Value Date   HGBA1C 5.0 12/27/2018   Component     Latest Ref Rng & Units 07/16/2015 08/06/2015 10/16/2015 05/29/2016  Prostate Specific Ag, Serum     0.0 - 4.0 ng/mL 7.6 (H) 7.0 (H) 6.5 (H) 5.6 (H)   Component     Latest Ref Rng & Units 12/10/2016 06/21/2017 01/20/2018 07/22/2018  Prostate Specific Ag, Serum     0.0 - 4.0 ng/mL 5.8 (H) 8.4 (H) 4.4 (H) 2.0   Component     Latest Ref Rng & Units 04/27/2019  Prostate Specific Ag, Serum     0.0 - 4.0 ng/mL 2.3    Assessment & Plan:    1. History of prostate cancer S/p Brachytherapy 2019 PSA fluctuating slightly but  significantly lower than time of diagnosis which is reassuring Given his fairly young age and intermediate prostate cancer history, have encouraged him to have PSA every 6 months He is seeing Dr. Baruch Gouty next spring and have encouraged him follow-up with me in 1 year He is agreeable this plan - PSA; Future   Return in about 1 year (around 05/01/2020) for psa PRIOR.  Hollice Espy, MD  [facility] [address] [place], [place] [address] [phone]

## 2019-05-23 ENCOUNTER — Encounter: Payer: Self-pay | Admitting: Internal Medicine

## 2019-05-24 ENCOUNTER — Other Ambulatory Visit: Payer: Self-pay

## 2019-05-24 DIAGNOSIS — Z20822 Contact with and (suspected) exposure to covid-19: Secondary | ICD-10-CM

## 2019-05-25 LAB — NOVEL CORONAVIRUS, NAA: SARS-CoV-2, NAA: NOT DETECTED

## 2019-06-16 ENCOUNTER — Other Ambulatory Visit: Payer: Self-pay | Admitting: Internal Medicine

## 2019-06-29 ENCOUNTER — Encounter: Payer: Self-pay | Admitting: *Deleted

## 2019-07-12 LAB — HM DIABETES EYE EXAM

## 2019-08-21 ENCOUNTER — Other Ambulatory Visit: Payer: Self-pay

## 2019-08-21 ENCOUNTER — Encounter: Payer: Self-pay | Admitting: Internal Medicine

## 2019-08-21 ENCOUNTER — Ambulatory Visit (INDEPENDENT_AMBULATORY_CARE_PROVIDER_SITE_OTHER): Payer: 59 | Admitting: Internal Medicine

## 2019-08-21 VITALS — BP 148/94 | HR 76 | Ht 72.0 in | Wt 364.0 lb

## 2019-08-21 DIAGNOSIS — N2581 Secondary hyperparathyroidism of renal origin: Secondary | ICD-10-CM

## 2019-08-21 DIAGNOSIS — E118 Type 2 diabetes mellitus with unspecified complications: Secondary | ICD-10-CM

## 2019-08-21 DIAGNOSIS — E1169 Type 2 diabetes mellitus with other specified complication: Secondary | ICD-10-CM | POA: Diagnosis not present

## 2019-08-21 DIAGNOSIS — I10 Essential (primary) hypertension: Secondary | ICD-10-CM

## 2019-08-21 DIAGNOSIS — Z0000 Encounter for general adult medical examination without abnormal findings: Secondary | ICD-10-CM | POA: Diagnosis not present

## 2019-08-21 DIAGNOSIS — E785 Hyperlipidemia, unspecified: Secondary | ICD-10-CM

## 2019-08-21 DIAGNOSIS — C61 Malignant neoplasm of prostate: Secondary | ICD-10-CM

## 2019-08-21 LAB — POCT URINALYSIS DIPSTICK
Bilirubin, UA: NEGATIVE
Blood, UA: NEGATIVE
Glucose, UA: NEGATIVE
Ketones, UA: NEGATIVE
Leukocytes, UA: NEGATIVE
Nitrite, UA: NEGATIVE
Protein, UA: POSITIVE — AB
Spec Grav, UA: 1.015 (ref 1.010–1.025)
Urobilinogen, UA: 0.2 E.U./dL
pH, UA: 6 (ref 5.0–8.0)

## 2019-08-21 MED ORDER — CLONIDINE 0.2 MG/24HR TD PTWK: 0.2000 mg | MEDICATED_PATCH | TRANSDERMAL | 6 refills | Status: DC

## 2019-08-21 NOTE — Progress Notes (Signed)
Date:  08/21/2019   Name:  Benjamin Stewart   DOB:  1976-08-10   MRN:  [id number]   Chief Complaint: Annual Exam Benjamin Stewart is a 44 y.o. male who presents today for his Complete Annual Exam. He feels fairly well. He reports exercising rarely. He reports he is sleeping well.   Immunization History  Administered Date(s) Administered  . Influenza,inj,Quad PF,6+ Mos 05/22/2014, 08/18/2016, 08/19/2017, 07/25/2018, 05/17/2019  . Influenza-Unspecified 06/14/2015  . Pneumococcal Polysaccharide-23 07/25/2018  . Pneumococcal-Unspecified 06/14/2015  . Tdap 06/14/2015    Diabetes He presents for his follow-up diabetic visit. He has type 2 diabetes mellitus. His disease course has been stable. Pertinent negatives for hypoglycemia include no dizziness or headaches. Pertinent negatives for diabetes include no chest pain, no fatigue, no polydipsia and no polyuria. Current diabetic treatment includes oral agent (monotherapy). He is compliant with treatment all of the time. His home blood glucose trend is decreasing steadily. An ACE inhibitor/angiotensin II receptor blocker is being taken. Eye exam is current.  Hypertension This is a chronic problem. The problem is controlled. Pertinent negatives include no chest pain, headaches, palpitations or shortness of breath. Past treatments include calcium channel blockers, beta blockers, angiotensin blockers and central alpha agonists. The current treatment provides significant improvement. There are no compliance problems.   Hyperlipidemia Pertinent negatives include no chest pain, myalgias or shortness of breath.  CKD - followed closely by Nephrology.  Renal function has been stable with secondary hyperPTH and mild hyperkalemia, Prostate cancer - stable without symptoms.  Followed closely by Rad Onc and Urology.  Last PSA 2.3 Lab Results  Component Value Date   CREATININE 2.75 (H) 12/27/2018   BUN 45 (H) 12/27/2018   NA 143 12/27/2018   K 5.1  12/27/2018   CL 112 (H) 12/27/2018   CO2 20 12/27/2018   Lab Results  Component Value Date   CHOL 109 08/16/2018   HDL 33 (L) 08/16/2018   LDLCALC 64 08/16/2018   TRIG 61 08/16/2018   CHOLHDL 3.3 08/16/2018   Lab Results  Component Value Date   TSH 1.420 08/16/2018   Lab Results  Component Value Date   HGBA1C 5.0 12/27/2018     Review of Systems  Constitutional: Negative for appetite change, chills, diaphoresis, fatigue and unexpected weight change.  HENT: Negative for hearing loss, tinnitus, trouble swallowing and voice change.   Eyes: Negative for visual disturbance.  Respiratory: Negative for choking, shortness of breath and wheezing.   Cardiovascular: Negative for chest pain, palpitations and leg swelling.  Gastrointestinal: Negative for abdominal pain, blood in stool, constipation and diarrhea.  Endocrine: Negative for polydipsia and polyuria.  Genitourinary: Negative for difficulty urinating, dysuria and frequency.  Musculoskeletal: Negative for arthralgias, back pain and myalgias.  Skin: Negative for color change and rash.  Allergic/Immunologic: Negative for environmental allergies.  Neurological: Negative for dizziness, syncope and headaches.  Hematological: Negative for adenopathy.  Psychiatric/Behavioral: Negative for dysphoric mood and sleep disturbance.    Patient Active Problem List   Diagnosis Date Noted  . Secondary hyperparathyroidism of renal origin ([place]) 04/24/2019  . Hyperkalemia 04/24/2019  . Localized edema 01/19/2019  . Chronic gout of multiple sites 08/16/2018  . Proteinuria due to type 2 diabetes mellitus ([place]) 08/22/2017  . Prostate cancer ([place]) 08/23/2015  . Type II diabetes mellitus with complication ([place]) 40/98/1191  . Hyperlipidemia associated with type 2 diabetes mellitus ([place]) 07/16/2015  . Benign essential hematuria 12/04/2014  . Essential (primary) hypertension 12/04/2014  . Morbid  obesity, unspecified obesity type ([place]) 12/04/2014      Allergies  Allergen Reactions  . Percocet [Oxycodone-Acetaminophen] Nausea And Vomiting    Past Surgical History:  Procedure Laterality Date  . PARTIAL COLECTOMY  2005  . RADIOACTIVE SEED IMPLANT N/A 12/06/2017   Procedure: RADIOACTIVE SEED IMPLANT/BRACHYTHERAPY IMPLANT;  Surgeon: Hollice Espy, MD;  Location: [facility] ORS;  Service: Urology;  Laterality: N/A;    Social History   Tobacco Use  . Smoking status: Never Smoker  . Smokeless tobacco: Never Used  Substance Use Topics  . Alcohol use: No    Alcohol/week: 0.0 standard drinks  . Drug use: No     Medication list has been reviewed and updated.  Current Meds  Medication Sig  . amLODipine (NORVASC) 10 MG tablet TAKE 1 TABLET BY MOUTH EVERY DAY  . atorvastatin (LIPITOR) 10 MG tablet TAKE 1 TABLET BY MOUTH EVERY DAY WITH BREAKFAST  . calcitRIOL (ROCALTROL) 0.25 MCG capsule TK 1 C PO QD  . carvedilol (COREG) 6.25 MG tablet Take 6.25 mg by mouth 2 (two) times daily with a meal.   . glimepiride (AMARYL) 2 MG tablet TAKE 1 TABLET BY MOUTH EVERY DAY WITH BREAKFAST  . hydrALAZINE (APRESOLINE) 100 MG tablet Take 100 mg by mouth 3 (three) times daily.  Marland Kitchen LOKELMA 10 g PACK packet MIX CONTENTS OF 1 PACKET IN THREE TABLESPOONSFUL OF WATER AND DRINK ONCE DAILY  . olmesartan (BENICAR) 40 MG tablet TAKE 1 TABLET(40 MG) BY MOUTH DAILY    PHQ 2/9 Scores 08/21/2019 01/19/2019 12/27/2018 08/16/2018  PHQ - 2 Score 0 0 0 0  PHQ- 9 Score 0 - - -    BP Readings from Last 3 Encounters:  08/21/19 (!) 148/94  05/02/19 (!) 145/85  01/19/19 135/78    Physical Exam Vitals and nursing note reviewed.  Constitutional:      Appearance: Normal appearance. He is well-developed. He is obese.  HENT:     Head: Normocephalic.     Right Ear: Tympanic membrane, ear canal and external ear normal.     Left Ear: Tympanic membrane, ear canal and external ear normal.     Nose: Nose normal.     Mouth/Throat:     Pharynx: Uvula midline.  Eyes:      Conjunctiva/sclera: Conjunctivae normal.     Pupils: Pupils are equal, round, and reactive to light.  Neck:     Thyroid: No thyromegaly.     Vascular: No carotid bruit.  Cardiovascular:     Rate and Rhythm: Normal rate and regular rhythm.     Pulses:          Dorsalis pedis pulses are 1+ on the right side and 1+ on the left side.       Posterior tibial pulses are 0 on the right side and 0 on the left side.     Heart sounds: Normal heart sounds.     Comments: Trace non pitting edema both lower legs with chronic skin darkening Pulmonary:     Effort: Pulmonary effort is normal.     Breath sounds: Normal breath sounds. No wheezing.  Chest:     Breasts:        Right: No mass.        Left: No mass.  Abdominal:     General: Bowel sounds are normal.     Palpations: Abdomen is soft.     Tenderness: There is no abdominal tenderness.     Hernia: A hernia is present. Hernia is  present in the umbilical area (non tender and reducible ~ 1.5 cm).  Musculoskeletal:        General: Normal range of motion.     Cervical back: Normal range of motion and neck supple.     Right lower leg: Edema present.     Left lower leg: Edema present.  Lymphadenopathy:     Cervical: No cervical adenopathy.  Skin:    General: Skin is warm and dry.     Capillary Refill: Capillary refill takes less than 2 seconds.  Neurological:     General: No focal deficit present.     Mental Status: He is alert and oriented to person, place, and time.     Deep Tendon Reflexes: Reflexes are normal and symmetric.  Psychiatric:        Speech: Speech normal.        Behavior: Behavior normal.        Thought Content: Thought content normal.        Judgment: Judgment normal.     Wt Readings from Last 3 Encounters:  08/21/19 (!) 364 lb (165.1 kg)  05/02/19 (!) 385 lb (174.6 kg)  01/19/19 (!) 389 lb (176.4 kg)    BP (!) 148/94   Pulse 76   Ht 6' (1.829 m)   Wt (!) 364 lb (165.1 kg)   SpO2 98%   BMI 49.37 kg/m    Assessment and Plan: 1. Annual physical exam Working on diet changes and losing weight steadily No specific complaints today - POCT urinalysis dipstick  2. Type II diabetes mellitus with complication (HCC) Clinically stable by exam and report without s/s of hypoglycemia. DM complicated by HTN, CKD. Tolerating medications - oral glimepiride,  well without side effects or other concerns. Recent eye exam completed - will ask pt to sign for notes - Comprehensive metabolic panel - Hemoglobin A1c - TSH  3. Essential (primary) hypertension Clinically stable exam with BP not controlled.  He stopped clonidine patches several months ago for unclear reasons. He will resume patches and monitor BP at home. Tolerating medications without side effects at this time. Pt to resume current regimen and low sodium diet; benefits of regular exercise as able discussed. - cloNIDine (CATAPRES - DOSED IN MG/24 HR) 0.2 mg/24hr patch; Place 1 patch (0.2 mg total) onto the skin once a week.  Dispense: 4 patch; Refill: 6  4. Hyperlipidemia associated with type 2 diabetes mellitus ([place]) Tolerating statin medication without side effects at this time LDL is at goal of < 70 on current dose Continue same therapy without change at this time. - Lipid panel  5. Morbid obesity, unspecified obesity type ([place]) He is working on diet changes and is losing weight steadily  6. Secondary hyperparathyroidism of renal origin [facility]) Being followed by Nephrology PTH 106 last month  7. Prostate cancer [facility]) Being followed closely by Rad Onc Dr. Baruch Gouty and Urology Dr. Erlene Quan   Partially dictated using Dragon software. Any errors are unintentional.  Halina Maidens, MD [facility]  08/21/2019

## 2019-08-22 ENCOUNTER — Encounter: Payer: Self-pay | Admitting: Internal Medicine

## 2019-08-22 LAB — LIPID PANEL
Chol/HDL Ratio: 4 ratio (ref 0.0–5.0)
Cholesterol, Total: 101 mg/dL (ref 100–199)
HDL: 25 mg/dL — ABNORMAL LOW (ref >39)
LDL Chol Calc (NIH): 59 mg/dL (ref 0–99)
Triglycerides: 86 mg/dL (ref 0–149)
VLDL Cholesterol Cal: 17 mg/dL (ref 5–40)

## 2019-08-22 LAB — COMPREHENSIVE METABOLIC PANEL
ALT: 11 IU/L (ref 0–44)
AST: 21 IU/L (ref 0–40)
Albumin/Globulin Ratio: 0.8 — ABNORMAL LOW (ref 1.2–2.2)
Albumin: 3.3 g/dL — ABNORMAL LOW (ref 4.0–5.0)
Alkaline Phosphatase: 45 IU/L (ref 39–117)
BUN/Creatinine Ratio: 15 (ref 9–20)
BUN: 45 mg/dL — ABNORMAL HIGH (ref 6–24)
Bilirubin Total: 0.6 mg/dL (ref 0.0–1.2)
CO2: 19 mmol/L — ABNORMAL LOW (ref 20–29)
Calcium: 9.6 mg/dL (ref 8.7–10.2)
Chloride: 108 mmol/L — ABNORMAL HIGH (ref 96–106)
Creatinine, Ser: 2.94 mg/dL — ABNORMAL HIGH (ref 0.76–1.27)
GFR calc Af Amer: 28 mL/min/{1.73_m2} — ABNORMAL LOW (ref >59)
GFR calc non Af Amer: 25 mL/min/{1.73_m2} — ABNORMAL LOW (ref >59)
Globulin, Total: 3.9 g/dL (ref 1.5–4.5)
Glucose: 99 mg/dL (ref 65–99)
Potassium: 3.8 mmol/L (ref 3.5–5.2)
Sodium: 141 mmol/L (ref 134–144)
Total Protein: 7.2 g/dL (ref 6.0–8.5)

## 2019-08-22 LAB — TSH: TSH: 1.58 u[IU]/mL (ref 0.450–4.500)

## 2019-08-22 LAB — HEMOGLOBIN A1C
Est. average glucose Bld gHb Est-mCnc: 103 mg/dL
Hgb A1c MFr Bld: 5.2 % (ref 4.8–5.6)

## 2019-10-11 ENCOUNTER — Encounter: Payer: Self-pay | Admitting: Internal Medicine

## 2019-10-13 ENCOUNTER — Encounter: Payer: Self-pay | Admitting: Emergency Medicine

## 2019-10-13 ENCOUNTER — Ambulatory Visit
Admission: EM | Admit: 2019-10-13 | Discharge: 2019-10-13 | Disposition: A | Discharge status: Home / Self Care | Payer: 59 | Attending: Family Medicine | Admitting: Family Medicine

## 2019-10-13 ENCOUNTER — Other Ambulatory Visit: Payer: Self-pay

## 2019-10-13 ENCOUNTER — Ambulatory Visit: Payer: 59

## 2019-10-13 ENCOUNTER — Ambulatory Visit (INDEPENDENT_AMBULATORY_CARE_PROVIDER_SITE_OTHER): Payer: 59

## 2019-10-13 DIAGNOSIS — M25562 Pain in left knee: Secondary | ICD-10-CM

## 2019-10-13 MED ORDER — TRAMADOL HCL 50 MG PO TABS: 50.0000 mg | ORAL_TABLET | Freq: Three times a day (TID) | ORAL | 0 refills | Status: DC | PRN

## 2019-10-13 NOTE — ED Provider Notes (Signed)
[facility]    CSN: [id number] Arrival date & time: 10/13/19  0906      History   Chief Complaint Chief Complaint  Patient presents with  . Knee Pain    left    HPI  44 year old male presents with left knee pain.  2-day history of left knee pain.  No fall, trauma, injury.  Reports pain is 8/10 in severity.  Patient reports that if he is moving around it seems to be better.  After he sits or rests, his knee feels stiff and he has difficulty getting moving again.  He has iced the area without resolution.  No medications tried.  No relieving factors.  No other associated symptoms.  No other complaints.  Past Medical History:  Diagnosis Date  . Benign essential hematuria 12/04/2014   microscopic hematuria evaluation with CT and cysto - treated for 2 months with bactrim DS; no further f/u planned unless hematuria recurs.   . Controlled type 2 diabetes mellitus without complication, without long-term current use of insulin ([place]) 07/16/2015  . Essential (primary) hypertension 12/04/2014  . History of kidney stones   . prostate cancer   . Sleep apnea     Patient Active Problem List   Diagnosis Date Noted  . Secondary hyperparathyroidism of renal origin ([place]) 04/24/2019  . Hyperkalemia 04/24/2019  . Localized edema 01/19/2019  . Chronic gout of multiple sites 08/16/2018  . Proteinuria due to type 2 diabetes mellitus ([place]) 08/22/2017  . Prostate cancer ([place]) 08/23/2015  . Type II diabetes mellitus with complication ([place]) 35/36/1443  . Hyperlipidemia associated with type 2 diabetes mellitus ([place]) 07/16/2015  . Benign essential hematuria 12/04/2014  . Essential (primary) hypertension 12/04/2014  . Morbid obesity, unspecified obesity type ([place]) 12/04/2014    Past Surgical History:  Procedure Laterality Date  . PARTIAL COLECTOMY  2005  . RADIOACTIVE SEED IMPLANT N/A 12/06/2017   Procedure: RADIOACTIVE SEED IMPLANT/BRACHYTHERAPY IMPLANT;  Surgeon: Hollice Espy, MD;   Location: [facility] ORS;  Service: Urology;  Laterality: N/A;       Home Medications    Prior to Admission medications   Medication Sig Start Date End Date Taking? Authorizing Provider  amLODipine (NORVASC) 10 MG tablet TAKE 1 TABLET BY MOUTH EVERY DAY 01/23/19  Yes Glean Hess, MD  atorvastatin (LIPITOR) 10 MG tablet TAKE 1 TABLET BY MOUTH EVERY DAY WITH BREAKFAST 01/23/19  Yes Glean Hess, MD  calcitRIOL (ROCALTROL) 0.25 MCG capsule TK 1 C PO QD 09/22/18  Yes [provider]  carvedilol (COREG) 6.25 MG tablet Take 6.25 mg by mouth 2 (two) times daily with a meal.  03/30/17  Yes [provider]  cloNIDine (CATAPRES - DOSED IN MG/24 HR) 0.2 mg/24hr patch Place 1 patch (0.2 mg total) onto the skin once a week. 08/21/19  Yes Glean Hess, MD  colchicine 0.6 MG tablet One twice a day for gout flare - up to 5 days 04/01/17  Yes Glean Hess, MD  glimepiride (AMARYL) 2 MG tablet TAKE 1 TABLET BY MOUTH EVERY DAY WITH BREAKFAST 06/16/19  Yes Glean Hess, MD  hydrALAZINE (APRESOLINE) 100 MG tablet Take 100 mg by mouth 3 (three) times daily.   Yes [provider]  LOKELMA 10 g PACK packet MIX CONTENTS OF 1 PACKET IN THREE TABLESPOONSFUL OF WATER AND DRINK ONCE DAILY 12/10/18  Yes [provider]  olmesartan (BENICAR) 40 MG tablet TAKE 1 TABLET(40 MG) BY MOUTH DAILY 02/14/19  Yes Glean Hess, MD  traMADol (ULTRAM) 50 MG tablet Take 1 tablet (50 mg total) by mouth every 8 (eight) hours as needed. 10/13/19   Coral Spikes, DO    Family History Family History  Problem Relation Age of Onset  . Diabetes Mother   . Heart disease Mother   . Hypertension Mother   . Glaucoma Mother   . Prostate cancer Father   . Diabetes Maternal Grandmother   . Diabetes Maternal Grandfather   . Prostate cancer Maternal Uncle     Social History Social History   Tobacco Use  . Smoking status: Never Smoker  . Smokeless tobacco: Never Used  Substance Use Topics    . Alcohol use: No    Alcohol/week: 0.0 standard drinks  . Drug use: No     Allergies   Percocet [oxycodone-acetaminophen]   Review of Systems Review of Systems  Constitutional: Negative.   Musculoskeletal:       Left knee pain.   Physical Exam Triage Vital Signs ED Triage Vitals  Enc Vitals Group     BP 10/13/19 0918 128/75     Pulse Rate 10/13/19 0918 82     Resp 10/13/19 0918 18     Temp 10/13/19 0918 98.2 F (36.8 C)     Temp Source 10/13/19 0918 Oral     SpO2 10/13/19 0918 100 %     Weight 10/13/19 0917 (!) 360 lb (163.3 kg)     Height 10/13/19 0917 6' (1.829 m)     Head Circumference --      Peak Flow --      Pain Score 10/13/19 0916 8     Pain Loc --      Pain Edu? --      Excl. in [place]? --    Updated Vital Signs BP 128/75 (BP Location: Left Arm)   Pulse 82   Temp 98.2 F (36.8 C) (Oral)   Resp 18   Ht 6' (1.829 m)   Wt (!) 163.3 kg   SpO2 100%   BMI 48.82 kg/m   Visual Acuity Right Eye Distance:   Left Eye Distance:   Bilateral Distance:    Right Eye Near:   Left Eye Near:    Bilateral Near:     Physical Exam Vitals and nursing note reviewed.  Constitutional:      General: He is not in acute distress.    Appearance: Normal appearance. He is obese. He is not ill-appearing.  HENT:     Head: Normocephalic and atraumatic.  Eyes:     General:        Right eye: No discharge.        Left eye: No discharge.     Conjunctiva/sclera: Conjunctivae normal.  Pulmonary:     Effort: Pulmonary effort is normal. No respiratory distress.  Musculoskeletal:     Comments: Left knee -no appreciable effusion on exam.  No palpable tenderness.  Ligaments intact.   Neurological:     Mental Status: He is alert.  Psychiatric:        Mood and Affect: Mood normal.        Behavior: Behavior normal.    UC Treatments / Results  Labs (all labs ordered are listed, but only abnormal results are displayed) Labs Reviewed - No data to  display  EKG   Radiology DG Knee Complete 4 Views Left  Result Date: 10/13/2019 CLINICAL DATA:  Knee pain. EXAM: LEFT KNEE - COMPLETE 4+ VIEW COMPARISON:  No recent. FINDINGS: Tiny knee  joint effusion cannot be excluded. Tiny sclerotic focus noted in the medial distal left femur, most likely small bone island. Corticated bony density noted along the anterior aspect of the patella. This may represent an old fracture fragment. No acute bony abnormality identified. No evidence of acute fracture. No evidence of dislocation. IMPRESSION: 1. Tiny knee joint effusion. No acute bony abnormality otherwise noted. 2. Corticated bony density noted along the anterior aspect of the patella. This may represent an old fracture fragment. Electronically Signed   By: Marcello Moores  Register   On: 10/13/2019 10:03    Procedures Procedures (including critical care time)  Medications Ordered in UC Medications - No data to display  Initial Impression / Assessment and Plan / UC Course  I have reviewed the triage vital signs and the nursing notes.  Pertinent labs & imaging results that were available during my care of the patient were reviewed by me and considered in my medical decision making (see chart for details).    44 year old male presents with acute left knee pain.  X-ray obtained and reviewed independently by me.  Interpretation: Normal joint spaces.  No acute fracture.  Placing on tramadol for pain.  Cannot use NSAIDs due to patient's advanced chronic kidney disease.  Advised to see orthopedics if persist.  May need MRI.  Final Clinical Impressions(s) / UC Diagnoses   Final diagnoses:  Acute pain of left knee     Discharge Instructions     Rest, ice, elevation.  Pain medication as needed.  If persists, see [facility] [phone]) OR [facility] [phone]) for an appt.  Take care  Dr. Lacinda Axon     ED Prescriptions    Medication Sig Dispense Auth. Provider   traMADol (ULTRAM)  50 MG tablet Take 1 tablet (50 mg total) by mouth every 8 (eight) hours as needed. 15 tablet Thersa Salt G, DO     I have reviewed the PDMP during this encounter.   Coral Spikes, DO 10/13/19 1046

## 2019-10-13 NOTE — Discharge Instructions (Signed)
Rest, ice, elevation.  Pain medication as needed.  If persists, see [facility] [phone]) OR [facility] [phone]) for an appt.  Take care  Dr. Lacinda Axon

## 2019-10-13 NOTE — ED Triage Notes (Signed)
Patient in today c/o left knee pain x 2 days. No injury noted. Patient has iced his knee, but hasn't taken any OTC meds. Patient states when he is walking at work, he feels like it is popping.

## 2019-10-20 ENCOUNTER — Inpatient Hospital Stay: Payer: 59 | Attending: Radiation Oncology

## 2019-10-20 DIAGNOSIS — C61 Malignant neoplasm of prostate: Secondary | ICD-10-CM | POA: Insufficient documentation

## 2019-10-20 LAB — PSA: Prostatic Specific Antigen: 2.4 ng/mL (ref 0.00–4.00)

## 2019-10-27 ENCOUNTER — Other Ambulatory Visit: Payer: Self-pay

## 2019-10-27 ENCOUNTER — Ambulatory Visit
Admission: RE | Admit: 2019-10-27 | Discharge: 2019-10-27 | Disposition: A | Discharge status: Home / Self Care | Payer: 59 | Source: Ambulatory Visit | Attending: Radiation Oncology | Admitting: Radiation Oncology

## 2019-10-27 ENCOUNTER — Encounter: Payer: Self-pay | Admitting: Radiation Oncology

## 2019-10-27 DIAGNOSIS — C61 Malignant neoplasm of prostate: Secondary | ICD-10-CM

## 2019-10-27 DIAGNOSIS — Z923 Personal history of irradiation: Secondary | ICD-10-CM | POA: Insufficient documentation

## 2019-10-27 NOTE — Progress Notes (Signed)
Radiation Oncology Follow up Note  Name: Benjamin Stewart   Date:   10/27/2019 MRN:  [id number] DOB: [birth year]    This 44 y.o. male presents to the clinic today for 2-year follow-up status post I-125 interstitial implant for stage IIa adenocarcinoma the prostate.  REFERRING PROVIDER: Glean Hess, MD  HPI: Patient is a 44 year old male now out 2 years having completed I-125 interstitial implant for a Gleason 7 (3+4) adenocarcinoma the prostate presenting with a PSA of 8.4 seen today in routine follow-up he is doing well specifically denies any increased lower urinary tract symptoms diarrhea or fatigue.  His PSA went from 1.4 a year ago to 2.4 this month..  COMPLICATIONS OF TREATMENT: none  FOLLOW UP COMPLIANCE: keeps appointments   PHYSICAL EXAM:  BP (!) (P) 160/99 (BP Location: Left Arm, Patient Position: Sitting)   Pulse (P) 82   Temp (!) (P) 97.4 F (36.3 C) (Tympanic)   Resp (P) 18   Wt (!) (P) 337 lb (152.9 kg)   BMI (P) 45.71 kg/m  Well-developed obese well-nourished patient in NAD. HEENT reveals PERLA, EOMI, discs not visualized.  Oral cavity is clear. No oral mucosal lesions are identified. Neck is clear without evidence of cervical or supraclavicular adenopathy. Lungs are clear to A&P. Cardiac examination is essentially unremarkable with regular rate and rhythm without murmur rub or thrill. Abdomen is benign with no organomegaly or masses noted. Motor sensory and DTR levels are equal and symmetric in the upper and lower extremities. Cranial nerves II through XII are grossly intact. Proprioception is intact. No peripheral adenopathy or edema is identified. No motor or sensory levels are noted. Crude visual fields are within normal range.  RADIOLOGY RESULTS: No current films to review  PLAN: Present time patient is doing well may be seeing a slight uptake in his PSA I will recheck it in 6 months and have ordered a PSA at that time with a follow-up appointment.  We will make  further recommendations based on the trend of that PSA.  Do not feel he is need of ADT therapy at this point in time.  Patient comprehends my recommendations well.  Follow appointment PSA test were ordered.  I would like to take this opportunity to thank you for allowing me to participate in the care of your patient.Noreene Filbert, MD

## 2019-11-09 ENCOUNTER — Other Ambulatory Visit: Payer: Self-pay

## 2019-11-09 ENCOUNTER — Inpatient Hospital Stay
Admission: EM | Admit: 2019-11-09 | Discharge: 2019-11-11 | DRG: 682 | Disposition: A | Discharge status: Home / Self Care | Payer: 59 | Attending: Internal Medicine | Admitting: Internal Medicine

## 2019-11-09 ENCOUNTER — Observation Stay (HOSPITAL_BASED_OUTPATIENT_CLINIC_OR_DEPARTMENT_OTHER)
Admit: 2019-11-09 | Discharge: 2019-11-09 | Disposition: A | Discharge status: Home / Self Care | Payer: 59 | Attending: Internal Medicine | Admitting: Internal Medicine

## 2019-11-09 ENCOUNTER — Observation Stay: Admit: 2019-11-09 | Payer: 59

## 2019-11-09 ENCOUNTER — Observation Stay: Payer: 59

## 2019-11-09 DIAGNOSIS — R55 Syncope and collapse: Secondary | ICD-10-CM

## 2019-11-09 DIAGNOSIS — Z87442 Personal history of urinary calculi: Secondary | ICD-10-CM

## 2019-11-09 DIAGNOSIS — N184 Chronic kidney disease, stage 4 (severe): Secondary | ICD-10-CM | POA: Diagnosis present

## 2019-11-09 DIAGNOSIS — E876 Hypokalemia: Secondary | ICD-10-CM | POA: Diagnosis present

## 2019-11-09 DIAGNOSIS — E162 Hypoglycemia, unspecified: Secondary | ICD-10-CM

## 2019-11-09 DIAGNOSIS — R7989 Other specified abnormal findings of blood chemistry: Secondary | ICD-10-CM | POA: Diagnosis present

## 2019-11-09 DIAGNOSIS — Z9289 Personal history of other medical treatment: Secondary | ICD-10-CM

## 2019-11-09 DIAGNOSIS — Z885 Allergy status to narcotic agent status: Secondary | ICD-10-CM

## 2019-11-09 DIAGNOSIS — E118 Type 2 diabetes mellitus with unspecified complications: Secondary | ICD-10-CM

## 2019-11-09 DIAGNOSIS — Y92009 Unspecified place in unspecified non-institutional (private) residence as the place of occurrence of the external cause: Secondary | ICD-10-CM

## 2019-11-09 DIAGNOSIS — E11649 Type 2 diabetes mellitus with hypoglycemia without coma: Secondary | ICD-10-CM | POA: Diagnosis present

## 2019-11-09 DIAGNOSIS — T39315A Adverse effect of propionic acid derivatives, initial encounter: Secondary | ICD-10-CM | POA: Diagnosis present

## 2019-11-09 DIAGNOSIS — Z83511 Family history of glaucoma: Secondary | ICD-10-CM

## 2019-11-09 DIAGNOSIS — Z9049 Acquired absence of other specified parts of digestive tract: Secondary | ICD-10-CM

## 2019-11-09 DIAGNOSIS — R42 Dizziness and giddiness: Secondary | ICD-10-CM

## 2019-11-09 DIAGNOSIS — I129 Hypertensive chronic kidney disease with stage 1 through stage 4 chronic kidney disease, or unspecified chronic kidney disease: Secondary | ICD-10-CM | POA: Diagnosis present

## 2019-11-09 DIAGNOSIS — Z992 Dependence on renal dialysis: Secondary | ICD-10-CM

## 2019-11-09 DIAGNOSIS — N170 Acute kidney failure with tubular necrosis: Secondary | ICD-10-CM | POA: Diagnosis not present

## 2019-11-09 DIAGNOSIS — Z8249 Family history of ischemic heart disease and other diseases of the circulatory system: Secondary | ICD-10-CM

## 2019-11-09 DIAGNOSIS — Z6835 Body mass index (BMI) 35.0-35.9, adult: Secondary | ICD-10-CM | POA: Diagnosis present

## 2019-11-09 DIAGNOSIS — M7989 Other specified soft tissue disorders: Secondary | ICD-10-CM

## 2019-11-09 DIAGNOSIS — M79604 Pain in right leg: Secondary | ICD-10-CM

## 2019-11-09 DIAGNOSIS — Z833 Family history of diabetes mellitus: Secondary | ICD-10-CM

## 2019-11-09 DIAGNOSIS — N2581 Secondary hyperparathyroidism of renal origin: Secondary | ICD-10-CM | POA: Diagnosis present

## 2019-11-09 DIAGNOSIS — I10 Essential (primary) hypertension: Secondary | ICD-10-CM | POA: Diagnosis present

## 2019-11-09 DIAGNOSIS — C61 Malignant neoplasm of prostate: Secondary | ICD-10-CM

## 2019-11-09 DIAGNOSIS — Z79899 Other long term (current) drug therapy: Secondary | ICD-10-CM

## 2019-11-09 DIAGNOSIS — U071 COVID-19: Secondary | ICD-10-CM | POA: Diagnosis present

## 2019-11-09 DIAGNOSIS — E1169 Type 2 diabetes mellitus with other specified complication: Secondary | ICD-10-CM | POA: Diagnosis present

## 2019-11-09 DIAGNOSIS — N029 Recurrent and persistent hematuria with unspecified morphologic changes: Secondary | ICD-10-CM | POA: Diagnosis present

## 2019-11-09 DIAGNOSIS — Z8042 Family history of malignant neoplasm of prostate: Secondary | ICD-10-CM

## 2019-11-09 DIAGNOSIS — W19XXXA Unspecified fall, initial encounter: Secondary | ICD-10-CM | POA: Diagnosis present

## 2019-11-09 DIAGNOSIS — N179 Acute kidney failure, unspecified: Secondary | ICD-10-CM

## 2019-11-09 DIAGNOSIS — E1122 Type 2 diabetes mellitus with diabetic chronic kidney disease: Secondary | ICD-10-CM | POA: Diagnosis present

## 2019-11-09 DIAGNOSIS — N186 End stage renal disease: Secondary | ICD-10-CM

## 2019-11-09 DIAGNOSIS — Z6841 Body Mass Index (BMI) 40.0 and over, adult: Secondary | ICD-10-CM

## 2019-11-09 DIAGNOSIS — M79605 Pain in left leg: Secondary | ICD-10-CM

## 2019-11-09 DIAGNOSIS — G4730 Sleep apnea, unspecified: Secondary | ICD-10-CM | POA: Diagnosis present

## 2019-11-09 LAB — COMPREHENSIVE METABOLIC PANEL
ALT: 17 U/L (ref 0–44)
AST: 32 U/L (ref 15–41)
Albumin: 2.7 g/dL — ABNORMAL LOW (ref 3.5–5.0)
Alkaline Phosphatase: 37 U/L — ABNORMAL LOW (ref 38–126)
Anion gap: 9 (ref 5–15)
BUN: 63 mg/dL — ABNORMAL HIGH (ref 6–20)
CO2: 23 mmol/L (ref 22–32)
Calcium: 8.4 mg/dL — ABNORMAL LOW (ref 8.9–10.3)
Chloride: 104 mmol/L (ref 98–111)
Creatinine, Ser: 4.74 mg/dL — ABNORMAL HIGH (ref 0.61–1.24)
GFR calc Af Amer: 16 mL/min — ABNORMAL LOW (ref >60)
GFR calc non Af Amer: 14 mL/min — ABNORMAL LOW (ref >60)
Glucose, Bld: 134 mg/dL — ABNORMAL HIGH (ref 70–99)
Potassium: 3.3 mmol/L — ABNORMAL LOW (ref 3.5–5.1)
Sodium: 136 mmol/L (ref 135–145)
Total Bilirubin: 0.6 mg/dL (ref 0.3–1.2)
Total Protein: 7.1 g/dL (ref 6.5–8.1)

## 2019-11-09 LAB — URINALYSIS, COMPLETE (UACMP) WITH MICROSCOPIC
Bacteria, UA: NONE SEEN
Bilirubin Urine: NEGATIVE
Glucose, UA: NEGATIVE mg/dL
Ketones, ur: NEGATIVE mg/dL
Leukocytes,Ua: NEGATIVE
Nitrite: NEGATIVE
Protein, ur: 100 mg/dL — AB
Specific Gravity, Urine: 1.006 (ref 1.005–1.030)
Squamous Epithelial / HPF: NONE SEEN (ref 0–5)
pH: 6 (ref 5.0–8.0)

## 2019-11-09 LAB — BASIC METABOLIC PANEL
Anion gap: 12 (ref 5–15)
BUN: 62 mg/dL — ABNORMAL HIGH (ref 6–20)
CO2: 22 mmol/L (ref 22–32)
Calcium: 8.5 mg/dL — ABNORMAL LOW (ref 8.9–10.3)
Chloride: 107 mmol/L (ref 98–111)
Creatinine, Ser: 4.32 mg/dL — ABNORMAL HIGH (ref 0.61–1.24)
GFR calc Af Amer: 18 mL/min — ABNORMAL LOW (ref >60)
GFR calc non Af Amer: 15 mL/min — ABNORMAL LOW (ref >60)
Glucose, Bld: 100 mg/dL — ABNORMAL HIGH (ref 70–99)
Potassium: 3.4 mmol/L — ABNORMAL LOW (ref 3.5–5.1)
Sodium: 141 mmol/L (ref 135–145)

## 2019-11-09 LAB — CBC
HCT: 31 % — ABNORMAL LOW (ref 39.0–52.0)
Hemoglobin: 10 g/dL — ABNORMAL LOW (ref 13.0–17.0)
MCH: 28.8 pg (ref 26.0–34.0)
MCHC: 32.3 g/dL (ref 30.0–36.0)
MCV: 89.3 fL (ref 80.0–100.0)
Platelets: 338 10*3/uL (ref 150–400)
RBC: 3.47 MIL/uL — ABNORMAL LOW (ref 4.22–5.81)
RDW: 12.4 % (ref 11.5–15.5)
WBC: 6.6 10*3/uL (ref 4.0–10.5)
nRBC: 0 % (ref 0.0–0.2)

## 2019-11-09 LAB — GLUCOSE, CAPILLARY
Glucose-Capillary: 105 mg/dL — ABNORMAL HIGH (ref 70–99)
Glucose-Capillary: 108 mg/dL — ABNORMAL HIGH (ref 70–99)
Glucose-Capillary: 115 mg/dL — ABNORMAL HIGH (ref 70–99)
Glucose-Capillary: 132 mg/dL — ABNORMAL HIGH (ref 70–99)
Glucose-Capillary: 200 mg/dL — ABNORMAL HIGH (ref 70–99)
Glucose-Capillary: 56 mg/dL — ABNORMAL LOW (ref 70–99)
Glucose-Capillary: 71 mg/dL (ref 70–99)
Glucose-Capillary: 82 mg/dL (ref 70–99)
Glucose-Capillary: 82 mg/dL (ref 70–99)
Glucose-Capillary: 88 mg/dL (ref 70–99)
Glucose-Capillary: 99 mg/dL (ref 70–99)
Glucose-Capillary: 99 mg/dL (ref 70–99)

## 2019-11-09 LAB — ECHOCARDIOGRAM COMPLETE
Height: 75 in
Weight: 5472 oz

## 2019-11-09 LAB — HIV ANTIBODY (ROUTINE TESTING W REFLEX): HIV Screen 4th Generation wRfx: NONREACTIVE

## 2019-11-09 LAB — CK: Total CK: 543 U/L — ABNORMAL HIGH (ref 49–397)

## 2019-11-09 LAB — TROPONIN I (HIGH SENSITIVITY)
Troponin I (High Sensitivity): 15 ng/L (ref <18)
Troponin I (High Sensitivity): 14 ng/L (ref <18)

## 2019-11-09 LAB — HEMOGLOBIN A1C
Hgb A1c MFr Bld: 5.1 % (ref 4.8–5.6)
Mean Plasma Glucose: 99.67 mg/dL

## 2019-11-09 LAB — TSH: TSH: 1.173 u[IU]/mL (ref 0.350–4.500)

## 2019-11-09 LAB — FIBRIN DERIVATIVES D-DIMER (ARMC ONLY): Fibrin derivatives D-dimer (ARMC): 4569.87 ng/mL (FEU) — ABNORMAL HIGH (ref 0.00–499.00)

## 2019-11-09 LAB — BRAIN NATRIURETIC PEPTIDE: B Natriuretic Peptide: 33 pg/mL (ref 0.0–100.0)

## 2019-11-09 LAB — SARS CORONAVIRUS 2 (TAT 6-24 HRS): SARS Coronavirus 2: POSITIVE — AB

## 2019-11-09 MED ORDER — PERFLUTREN LIPID MICROSPHERE
1.0000 mL | INTRAVENOUS | Status: AC | PRN
  Administered 2019-11-09: 12:00:00 2 mL via INTRAVENOUS
  Filled 2019-11-09: qty 10

## 2019-11-09 MED ORDER — CARVEDILOL 3.125 MG PO TABS
6.2500 mg | ORAL_TABLET | Freq: Two times a day (BID) | ORAL | Status: DC
  Administered 2019-11-09 – 2019-11-11 (×6): 6.25 mg via ORAL
  Filled 2019-11-09 (×3): qty 2
  Filled 2019-11-09: qty 1
  Filled 2019-11-09: qty 2
  Filled 2019-11-09: qty 1

## 2019-11-09 MED ORDER — ONDANSETRON HCL 4 MG/2ML IJ SOLN: 4.0000 mg | Freq: Four times a day (QID) | INTRAMUSCULAR | Status: DC | PRN

## 2019-11-09 MED ORDER — ACETAMINOPHEN 325 MG PO TABS: 650.0000 mg | ORAL_TABLET | Freq: Four times a day (QID) | ORAL | Status: DC | PRN

## 2019-11-09 MED ORDER — DEXTROSE 50 % IV SOLN
INTRAVENOUS | Status: AC
  Administered 2019-11-09: 09:00:00 50 mL via INTRAVENOUS
  Filled 2019-11-09: qty 50

## 2019-11-09 MED ORDER — DEXTROSE IN LACTATED RINGERS 5 % IV SOLN: INTRAVENOUS | Status: DC

## 2019-11-09 MED ORDER — INSULIN ASPART 100 UNIT/ML ~~LOC~~ SOLN
0.0000 [IU] | Freq: Three times a day (TID) | SUBCUTANEOUS | Status: DC
  Administered 2019-11-10 – 2019-11-11 (×4): 3 [IU] via SUBCUTANEOUS
  Filled 2019-11-09 (×4): qty 1

## 2019-11-09 MED ORDER — ONDANSETRON HCL 4 MG PO TABS: 4.0000 mg | ORAL_TABLET | Freq: Four times a day (QID) | ORAL | Status: DC | PRN

## 2019-11-09 MED ORDER — ATORVASTATIN CALCIUM 20 MG PO TABS
10.0000 mg | ORAL_TABLET | Freq: Every day | ORAL | Status: DC
  Administered 2019-11-09 – 2019-11-11 (×3): 10 mg via ORAL
  Filled 2019-11-09 (×3): qty 1

## 2019-11-09 MED ORDER — LACTATED RINGERS IV SOLN: INTRAVENOUS | Status: DC

## 2019-11-09 MED ORDER — ACETAMINOPHEN 650 MG RE SUPP: 650.0000 mg | Freq: Four times a day (QID) | RECTAL | Status: DC | PRN

## 2019-11-09 MED ORDER — DEXTROSE 10 % IV SOLN
INTRAVENOUS | Status: DC
  Administered 2019-11-09: 01:00:00 1000 mL via INTRAVENOUS

## 2019-11-09 MED ORDER — ENOXAPARIN SODIUM 40 MG/0.4ML ~~LOC~~ SOLN
40.0000 mg | SUBCUTANEOUS | Status: DC
  Administered 2019-11-09: 08:00:00 40 mg via SUBCUTANEOUS
  Filled 2019-11-09: qty 0.4

## 2019-11-09 MED ORDER — AMLODIPINE BESYLATE 10 MG PO TABS
10.0000 mg | ORAL_TABLET | Freq: Every day | ORAL | Status: DC
  Administered 2019-11-09 – 2019-11-11 (×3): 10 mg via ORAL
  Filled 2019-11-09: qty 1
  Filled 2019-11-09: qty 2
  Filled 2019-11-09: qty 1

## 2019-11-09 MED ORDER — HYDRALAZINE HCL 50 MG PO TABS
100.0000 mg | ORAL_TABLET | Freq: Three times a day (TID) | ORAL | Status: DC
  Administered 2019-11-10 – 2019-11-11 (×6): 100 mg via ORAL
  Filled 2019-11-09 (×6): qty 2

## 2019-11-09 MED ORDER — DEXTROSE-NACL 5-0.45 % IV SOLN
INTRAVENOUS | Status: DC
  Administered 2019-11-09: 03:00:00 1000 mL via INTRAVENOUS

## 2019-11-09 MED ORDER — DEXTROSE 50 % IV SOLN
1.0000 | Freq: Once | INTRAVENOUS | Status: AC
  Administered 2019-11-09: 01:00:00 50 mL via INTRAVENOUS

## 2019-11-09 MED ORDER — CALCITRIOL 0.25 MCG PO CAPS
0.2500 ug | ORAL_CAPSULE | Freq: Every day | ORAL | Status: DC
  Administered 2019-11-09 – 2019-11-11 (×3): 0.25 ug via ORAL
  Filled 2019-11-09 (×3): qty 1

## 2019-11-09 MED ORDER — DEXTROSE 50 % IV SOLN
50.0000 mL | Freq: Once | INTRAVENOUS | Status: AC
  Administered 2019-11-09: 50 mL via INTRAVENOUS

## 2019-11-09 MED ORDER — SODIUM CHLORIDE 0.9% FLUSH
3.0000 mL | Freq: Two times a day (BID) | INTRAVENOUS | Status: DC
  Administered 2019-11-09 – 2019-11-11 (×5): 3 mL via INTRAVENOUS

## 2019-11-09 NOTE — Progress Notes (Signed)
*  PRELIMINARY RESULTS* Echocardiogram 2D Echocardiogram has been performed.  Benjamin Stewart 11/09/2019, 12:02 PM

## 2019-11-09 NOTE — ED Notes (Signed)
Pt given cracker peanut butter and OJ

## 2019-11-09 NOTE — ED Notes (Signed)
Pt resting quietly.

## 2019-11-09 NOTE — ED Notes (Signed)
Pt given breakfast tray at this time. 

## 2019-11-09 NOTE — ED Notes (Signed)
Pt rate changed to 58ml per md Owens Shark.

## 2019-11-09 NOTE — ED Notes (Signed)
US at bedside

## 2019-11-09 NOTE — Progress Notes (Signed)
Inpatient Diabetes Program Recommendations  AACE/ADA: New Consensus Statement on Inpatient Glycemic Control (2015)  Target Ranges:  Prepandial:   less than 140 mg/dL      Peak postprandial:   less than 180 mg/dL (1-2 hours)      Critically ill patients:  140 - 180 mg/dL   Lab Results  Component Value Date   GLUCAP 115 (H) 11/09/2019   HGBA1C 5.1 11/09/2019    Review of Glycemic Control Results for Benjamin Stewart, Benjamin Stewart (MRN [id number]) as of 11/09/2019 12:18  Ref. Range 11/09/2019 04:52 11/09/2019 06:30 11/09/2019 08:24 11/09/2019 09:14 11/09/2019 10:47  Glucose-Capillary Latest Ref Range: 70 - 99 mg/dL 82 99 56 (L) 132 (H) 115 (H)   Diabetes history: DM 2 Outpatient Diabetes medications:  Amaryl 2 mg daily Current orders for Inpatient glycemic control:  Novolog resistant tid with meals   Inpatient Diabetes Program Recommendations:    Note low blood sugar this AM.  Please consider reducing Novolog correction to sensitive tid with meals.   Thanks,  Adah Perl, RN, BC-ADM Inpatient Diabetes Coordinator Pager [phone] (8a-5p)

## 2019-11-09 NOTE — Progress Notes (Addendum)
Progress Note    Benjamin Stewart  PYP:[id number] DOB: 11/22/1975  DOA: 11/09/2019 PCP: Glean Hess, MD      Brief Narrative:    Medical records reviewed and are as summarized below:  Benjamin Stewart is an 44 y.o. male with medical history significant for diabetes, hypertension, prostate cancer, morbid obesity and CKD 3, who presents to the emergency room after he suffered a fall at home.  He said while he was urinating he started to feel lightheaded and fell against the wall and then had to sit down against the bathtub.  He denies losing consciousness.  His brother called 60.  Blood sugar reportedly 51 with EMS.  Patient stated that earlier in the day when he went to work he was also feeling lightheaded but he did not check his blood sugar at the time.      Assessment/Plan:   Principal Problem:   Postural dizziness with presyncope Active Problems:   Essential (primary) hypertension   Morbid obesity, unspecified obesity type (HCC)   Type II diabetes mellitus with complication (HCC)   Prostate cancer (HCC)   AKI (acute kidney injury) (HCC)   CKD (chronic kidney disease) stage 3, GFR 30-59 ml/min   Hypoglycemia    Postural dizziness with presyncope/syncope   Hypoglycemia -Etiology of presyncope suspect related to hypoglycemia/ dehydration Troponin is negative.  2D echo is pending.  Carotid duplex unremarkable.  Orthostatic vital signs negative  Elevated D-dimer  etiology and significance of elevated D-dimer is unclear.  Obtain VQ scan and venous duplex to rule out venous thromboembolism  because of presyncope/syncope.  Elevated D-dimer could also be due to COVID-19 infection.  Type II diabetes mellitus with hypoglycemia (HCC) -Glucose levels have improved.  Hold glimepiride and monitor glucose levels closely.  Hemoglobin A1c is 5.1.  AKI (acute kidney injury) ([place]) on CKD stage IV Baseline creatinine is between 2.5-2.9. Change IV D5 half NS to D5 Ringer's  lactate infusion.  Monitor BMP and urine output. -Avoid nephrotoxins    Essential (primary) hypertension -Hold olmesartan because of AKI.  Continue other home antihypertensives.  COVID-19 infection He is asymptomatic from this.  He is tolerating room air.  No need for steroids or remdesivir at this time.    Morbid obesity (BMI 24.5) -This complicates overall prognosis and care    Prostate cancer ([place]) -In remission.  He was treated with radioactive seed in 2019   Body mass index is 46.14 kg/m.  (Morbid obesity)    Family Communication/Anticipated D/C date and plan/Code Status   DVT prophylaxis: Heparin Code Status: Full code Family Communication: Plan discussed with the patient  disposition Plan: Patient is from home.  Plan to discharge him home when creatinine returns to baseline      Subjective:   No complaints.  He feels fine.  No chest pain, shortness of breath, dizziness or palpitations.  He said he felt lightheaded at home and fell against the wall after he had urinated in the bathroom.  He does not think he lost consciousness.  Objective:    Vitals:   11/09/19 1657 11/09/19 1700 11/09/19 1702 11/09/19 1706  BP: 136/74 138/82 (!) 155/84 (!) 164/89  Pulse: 82 88 95 89  Resp: 18     Temp: 98.2 F (36.8 C)     TempSrc: Oral     SpO2: 100% 100% 100% 100%  Weight:      Height:        Intake/Output Summary (Last 24  hours) at 11/09/2019 1713 Last data filed at 11/09/2019 1705 Gross per 24 hour  Intake 1207.54 ml  Output 1400 ml  Net -192.46 ml   Filed Weights   11/09/19 0049 11/09/19 1407  Weight: (!) 155.1 kg (!) 154.3 kg    Exam:  GEN: NAD SKIN: No rash EYES: EOMI ENT: MMM CV: RRR PULM: CTA B ABD: soft, obese, NT, +BS CNS: AAO x 3, non focal EXT: No edema or tenderness   Data Reviewed:   I have personally reviewed following labs and imaging studies:  Labs: Labs show the following:   Basic Metabolic Panel: Recent Labs  Lab 11/09/19  0036 11/09/19 1419  NA 136 141  K 3.3* 3.4*  CL 104 107  CO2 23 22  GLUCOSE 134* 100*  BUN 63* 62*  CREATININE 4.74* 4.32*  CALCIUM 8.4* 8.5*   GFR Estimated Creatinine Clearance: 32.7 mL/min (A) (by C-G formula based on SCr of 4.32 mg/dL (H)). Liver Function Tests: Recent Labs  Lab 11/09/19 0036  AST 32  ALT 17  ALKPHOS 37*  BILITOT 0.6  PROT 7.1  ALBUMIN 2.7*   No results for input(s): LIPASE, AMYLASE in the last 168 hours. No results for input(s): AMMONIA in the last 168 hours. Coagulation profile No results for input(s): INR, PROTIME in the last 168 hours.  CBC: Recent Labs  Lab 11/09/19 0036  WBC 6.6  HGB 10.0*  HCT 31.0*  MCV 89.3  PLT 338   Cardiac Enzymes: Recent Labs  Lab 11/09/19 0245  CKTOTAL 543*   BNP (last 3 results) No results for input(s): PROBNP in the last 8760 hours. CBG: Recent Labs  Lab 11/09/19 0914 11/09/19 1047 11/09/19 1230 11/09/19 1406 11/09/19 1651  GLUCAP 132* 115* 108* 88 71   D-Dimer: No results for input(s): DDIMER in the last 72 hours. Hgb A1c: Recent Labs    11/09/19 0245  HGBA1C 5.1   Lipid Profile: No results for input(s): CHOL, HDL, LDLCALC, TRIG, CHOLHDL, LDLDIRECT in the last 72 hours. Thyroid function studies: Recent Labs    11/09/19 0245  TSH 1.173   Anemia work up: No results for input(s): VITAMINB12, FOLATE, FERRITIN, TIBC, IRON, RETICCTPCT in the last 72 hours. Sepsis Labs: Recent Labs  Lab 11/09/19 0036  WBC 6.6    Microbiology Recent Results (from the past 240 hour(s))  SARS CORONAVIRUS 2 (TAT 6-24 HRS) Nasopharyngeal Nasopharyngeal Swab     Status: Abnormal   Collection Time: 11/09/19 12:37 AM   Specimen: Nasopharyngeal Swab  Result Value Ref Range Status   SARS Coronavirus 2 POSITIVE (A) NEGATIVE Final    Comment: RESULT CALLED TO, READ BACK BY AND VERIFIED WITH: B. Alejo RN 14:40 11/09/19 ([email or web address]) (NOTE) SARS-CoV-2 target nucleic acids are DETECTED. The SARS-CoV-2 RNA is  generally detectable in upper and lower respiratory specimens during the acute phase of infection. Positive results are indicative of the presence of SARS-CoV-2 RNA. Clinical correlation with patient history and other diagnostic information is  necessary to determine patient infection status. Positive results do not rule out bacterial infection or co-infection with other viruses.  The expected result is Negative. Fact Sheet for Patients: SugarRoll.be Fact Sheet for Healthcare Providers: https://www.woods-mathews.com/ This test is not yet approved or cleared by the [place] FDA and  has been authorized for detection and/or diagnosis of SARS-CoV-2 by FDA under an Emergency Use Authorization (EUA). This EUA will remain  in effect (meaning this test can be used) for the  duration of the COVID-19 declaration under  Section 564(b)(1) of the Act, 21 U.S.C. section 360bbb-3(b)(1), unless the authorization is terminated or revoked sooner. Performed at [facility], [place] [address], [place], [place] [address]     Procedures and diagnostic studies:  US Carotid Bilateral  Result Date: 11/09/2019 CLINICAL DATA:  Syncope, collapse.  Hypertension, diabetes. EXAM: BILATERAL CAROTID DUPLEX ULTRASOUND TECHNIQUE: Pearline Cables scale imaging, color Doppler and duplex ultrasound were performed of bilateral carotid and vertebral arteries in the neck. COMPARISON:  None available FINDINGS: Criteria: Quantification of carotid stenosis is based on velocity parameters that correlate the residual internal carotid diameter with NASCET-based stenosis levels, using the diameter of the distal internal carotid lumen as the denominator for stenosis measurement. The following velocity measurements were obtained: RIGHT ICA: 109/31 cm/sec CCA: 195/97 cm/sec SYSTOLIC ICA/CCA RATIO:  0.8 ECA: 151 cm/sec LEFT ICA: 117/28 cm/sec CCA: 471/85 cm/sec SYSTOLIC ICA/CCA RATIO:  0.8 ECA: 131 cm/sec  RIGHT CAROTID ARTERY: Minimal plaque in the proximal ICA just beyond its origin without stenosis. Normal waveforms and color Doppler signal. RIGHT VERTEBRAL ARTERY:  Normal flow direction and waveform. LEFT CAROTID ARTERY: Mild eccentric plaque in the bulb and proximal ICA. No high-grade stenosis. Normal waveforms and color Doppler signal. Mild tortuosity. LEFT VERTEBRAL ARTERY:  Normal flow direction and waveform. IMPRESSION: 1. Bilateral carotid plaque resulting in less than 50% diameter ICA stenosis. 2. Antegrade bilateral vertebral arterial flow. Electronically Signed   By: Lucrezia Europe M.D.   On: 11/09/2019 10:04    Medications:   . amLODipine  10 mg Oral Daily  . atorvastatin  10 mg Oral Daily  . calcitRIOL  0.25 mcg Oral Daily  . carvedilol  6.25 mg Oral BID WC  . enoxaparin (LOVENOX) injection  40 mg Subcutaneous Q24H  . hydrALAZINE  100 mg Oral TID  . insulin aspart  0-20 Units Subcutaneous TID WC  . sodium chloride flush  3 mL Intravenous Q12H   Continuous Infusions: . dextrose 5% lactated ringers       LOS: 0 days   Benjamin Stewart  [facility]     11/09/2019, 5:13 PM

## 2019-11-09 NOTE — Progress Notes (Addendum)
Lab called to inform patient's covid results are positive. Bed request put in to transfer patient. Patient and visitor informed of these changes. Patient alert and oriented x4, no complaints of any pain or shortness of breath. Visitor has left. VSS. MD also notified.   Report given to [facility] IC RN. Pt will be transferring to IC.

## 2019-11-09 NOTE — ED Notes (Signed)
NP Ouma made aware of CBG

## 2019-11-09 NOTE — ED Notes (Signed)
Pt given crackers, peanut butter and OJ per NP Stark Klein

## 2019-11-09 NOTE — ED Notes (Signed)
ED TO INPATIENT HANDOFF REPORT  ED Nurse Name and Phone #: [phone]  S Name/Age/Gender Benjamin Stewart 44 y.o. male Room/Bed: ED30A/ED30A  Code Status   Code Status: Full Code  Home/SNF/Other Home Patient oriented to: self, place, time and situation Is this baseline? Yes   Triage Complete: Triage complete  Chief Complaint Postural dizziness with presyncope [R42, R55]  Triage Note Pt to the er for hypoglycemia via [place]. Pt BG was 51 at home. Pt did fall while in the bathroom urinating. Pt denies pain at this time. Pt received D10 and 2 packets of glucose. Blood sugar 178 with ems. Pt had lost weight and had come off of his metformin. No trouble prior to today.     Allergies Allergies  Allergen Reactions  . Percocet [Oxycodone-Acetaminophen] Nausea And Vomiting    Level of Care/Admitting Diagnosis ED Disposition    ED Disposition Condition [facility]: [place] [[id number]]  Level of Care: Med-Surg [16]  Covid Evaluation: Asymptomatic Screening Protocol (No Symptoms)  Diagnosis: Postural dizziness with presyncope [[id number]]  Admitting Physician: Athena Masse [[id number]]  Attending Physician: Athena Masse [[id number]]       B Medical/Surgery History Past Medical History:  Diagnosis Date  . Benign essential hematuria 12/04/2014   microscopic hematuria evaluation with CT and cysto - treated for 2 months with bactrim DS; no further f/u planned unless hematuria recurs.   . Controlled type 2 diabetes mellitus without complication, without long-term current use of insulin ([place]) 07/16/2015  . Essential (primary) hypertension 12/04/2014  . History of kidney stones   . prostate cancer   . Sleep apnea    Past Surgical History:  Procedure Laterality Date  . PARTIAL COLECTOMY  2005  . RADIOACTIVE SEED IMPLANT N/A 12/06/2017   Procedure: RADIOACTIVE SEED IMPLANT/BRACHYTHERAPY IMPLANT;  Surgeon: Hollice Espy, MD;   Location: [facility] ORS;  Service: Urology;  Laterality: N/A;     A IV Location/Drains/Wounds Patient Lines/Drains/Airways Status   Active Line/Drains/Airways    Name:   Placement date:   Placement time:   Site:   Days:   Peripheral IV 11/09/19 Left Antecubital   11/09/19    0030    Antecubital   less than 1   Peripheral IV 11/09/19 Left;Posterior Hand   11/09/19    0030    Hand   less than 1   Incision (Closed) 12/06/17 Penis Other (Comment)   12/06/17    0820     703          Intake/Output Last 24 hours  Intake/Output Summary (Last 24 hours) at 11/09/2019 1241 Last data filed at 11/09/2019 0719 Gross per 24 hour  Intake 203.33 ml  Output 700 ml  Net -496.67 ml    Labs/Imaging Results for orders placed or performed during the hospital encounter of 11/09/19 (from the past 48 hour(s))  Glucose, capillary     Status: None   Collection Time: 11/09/19 12:18 AM  Result Value Ref Range   Glucose-Capillary 99 70 - 99 mg/dL    Comment: Glucose reference range applies only to samples taken after fasting for at least 8 hours.  Urinalysis, Complete w Microscopic     Status: Abnormal   Collection Time: 11/09/19 12:34 AM  Result Value Ref Range   Color, Urine STRAW (A) YELLOW   APPearance CLEAR (A) CLEAR   Specific Gravity, Urine 1.006 1.005 - 1.030   pH 6.0 5.0 - 8.0  Glucose, UA NEGATIVE NEGATIVE mg/dL   Hgb urine dipstick MODERATE (A) NEGATIVE   Bilirubin Urine NEGATIVE NEGATIVE   Ketones, ur NEGATIVE NEGATIVE mg/dL   Protein, ur 100 (A) NEGATIVE mg/dL   Nitrite NEGATIVE NEGATIVE   Leukocytes,Ua NEGATIVE NEGATIVE   RBC / HPF 21-50 0 - 5 RBC/hpf   WBC, UA 0-5 0 - 5 WBC/hpf   Bacteria, UA NONE SEEN NONE SEEN   Squamous Epithelial / LPF NONE SEEN 0 - 5   Mucus PRESENT     Comment: Performed at [facility], [address] [place], [place] [address]  Brain natriuretic peptide     Status: None   Collection Time: 11/09/19 12:34 AM  Result Value Ref Range   B Natriuretic  Peptide 33.0 0.0 - 100.0 pg/mL    Comment: Performed at [facility], [address] [place], [place] [address]  CBC     Status: Abnormal   Collection Time: 11/09/19 12:36 AM  Result Value Ref Range   WBC 6.6 4.0 - 10.5 K/uL   RBC 3.47 (L) 4.22 - 5.81 MIL/uL   Hemoglobin 10.0 (L) 13.0 - 17.0 g/dL   HCT 31.0 (L) 39.0 - 52.0 %   MCV 89.3 80.0 - 100.0 fL   MCH 28.8 26.0 - 34.0 pg   MCHC 32.3 30.0 - 36.0 g/dL   RDW 12.4 11.5 - 15.5 %   Platelets 338 150 - 400 K/uL   nRBC 0.0 0.0 - 0.2 %    Comment: Performed at [facility], [address] [place], [place] [address]  Comprehensive metabolic panel     Status: Abnormal   Collection Time: 11/09/19 12:36 AM  Result Value Ref Range   Sodium 136 135 - 145 mmol/L   Potassium 3.3 (L) 3.5 - 5.1 mmol/L   Chloride 104 98 - 111 mmol/L   CO2 23 22 - 32 mmol/L   Glucose, Bld 134 (H) 70 - 99 mg/dL    Comment: Glucose reference range applies only to samples taken after fasting for at least 8 hours.   BUN 63 (H) 6 - 20 mg/dL   Creatinine, Ser 4.74 (H) 0.61 - 1.24 mg/dL   Calcium 8.4 (L) 8.9 - 10.3 mg/dL   Total Protein 7.1 6.5 - 8.1 g/dL   Albumin 2.7 (L) 3.5 - 5.0 g/dL   AST 32 15 - 41 U/L   ALT 17 0 - 44 U/L   Alkaline Phosphatase 37 (L) 38 - 126 U/L   Total Bilirubin 0.6 0.3 - 1.2 mg/dL   GFR calc non Af Amer 14 (L) >60 mL/min   GFR calc Af Amer 16 (L) >60 mL/min   Anion gap 9 5 - 15    Comment: Performed at [facility], [address] [place], [place] [address]  Glucose, capillary     Status: Abnormal   Collection Time: 11/09/19  1:26 AM  Result Value Ref Range   Glucose-Capillary 200 (H) 70 - 99 mg/dL    Comment: Glucose reference range applies only to samples taken after fasting for at least 8 hours.  CK     Status: Abnormal   Collection Time: 11/09/19  2:45 AM  Result Value Ref Range   Total CK 543 (H) 49 - 397 U/L    Comment: Performed at [facility], [address] [place], [place]  [address]  Hemoglobin A1c     Status: None   Collection Time: 11/09/19  2:45 AM  Result Value Ref  Range   Hgb A1c MFr Bld 5.1 4.8 - 5.6 %    Comment: (NOTE) Pre diabetes:          5.7%-6.4% Diabetes:              >6.4% Glycemic control for   <7.0% adults with diabetes    Mean Plasma Glucose 99.67 mg/dL    Comment: Performed at [facility] [address], [place], [place] [address]  HIV Antibody (routine testing w rflx)     Status: None   Collection Time: 11/09/19  2:45 AM  Result Value Ref Range   HIV Screen 4th Generation wRfx NON REACTIVE NON REACTIVE    Comment: Performed at [facility] [address], [place], [place] [address]  Troponin I (High Sensitivity)     Status: None   Collection Time: 11/09/19  2:45 AM  Result Value Ref Range   Troponin I (High Sensitivity) 15 <18 ng/L    Comment: (NOTE) Elevated high sensitivity troponin I (hsTnI) values and significant  changes across serial measurements may suggest ACS but many other  chronic and acute conditions are known to elevate hsTnI results.  Refer to the "Links" section for chest pain algorithms and additional  guidance. Performed at [facility], [address] [place], [place] [address]   TSH     Status: None   Collection Time: 11/09/19  2:45 AM  Result Value Ref Range   TSH 1.173 0.350 - 4.500 uIU/mL    Comment: Performed by a 3rd Generation assay with a functional sensitivity of <=0.01 uIU/mL. Performed at [facility], [address] [place], [place] [address]   Fibrin derivatives D-Dimer [facility] only)     Status: Abnormal   Collection Time: 11/09/19  2:45 AM  Result Value Ref Range   Fibrin derivatives D-dimer ([facility]) 4,569.87 (H) 0.00 - 499.00 ng/mL (FEU)    Comment: (NOTE) <> Exclusion of Venous Thromboembolism (VTE) - OUTPATIENT ONLY   (Emergency Department or [place])   0-499 ng/ml (FEU): With a low to intermediate pretest probability                      for VTE this test  result excludes the diagnosis                      of VTE.   >499 ng/ml (FEU) : VTE not excluded; additional work up for VTE is                      required. <> Testing on Inpatients and Evaluation of Disseminated Intravascular   Coagulation (DIC) Reference Range:   0-499 ng/ml (FEU) Performed at [facility], [address] [place], [place] [address]   Glucose, capillary     Status: None   Collection Time: 11/09/19  4:52 AM  Result Value Ref Range   Glucose-Capillary 82 70 - 99 mg/dL    Comment: Glucose reference range applies only to samples taken after fasting for at least 8 hours.  Troponin I (High Sensitivity)     Status: None   Collection Time: 11/09/19  4:55 AM  Result Value Ref Range   Troponin I (High Sensitivity) 14 <18 ng/L    Comment: (NOTE) Elevated high sensitivity troponin I (hsTnI) values and significant  changes across serial measurements may suggest ACS but many other  chronic and acute conditions are known to elevate hsTnI results.  Refer  to the "Links" section for chest pain algorithms and additional  guidance. Performed at [facility], [address] [place], [place] [address]   Glucose, capillary     Status: None   Collection Time: 11/09/19  6:30 AM  Result Value Ref Range   Glucose-Capillary 99 70 - 99 mg/dL    Comment: Glucose reference range applies only to samples taken after fasting for at least 8 hours.  Glucose, capillary     Status: Abnormal   Collection Time: 11/09/19  8:24 AM  Result Value Ref Range   Glucose-Capillary 56 (L) 70 - 99 mg/dL    Comment: Glucose reference range applies only to samples taken after fasting for at least 8 hours.  Glucose, capillary     Status: Abnormal   Collection Time: 11/09/19  9:14 AM  Result Value Ref Range   Glucose-Capillary 132 (H) 70 - 99 mg/dL    Comment: Glucose reference range applies only to samples taken after fasting for at least 8 hours.  Glucose, capillary     Status:  Abnormal   Collection Time: 11/09/19 10:47 AM  Result Value Ref Range   Glucose-Capillary 115 (H) 70 - 99 mg/dL    Comment: Glucose reference range applies only to samples taken after fasting for at least 8 hours.  Glucose, capillary     Status: Abnormal   Collection Time: 11/09/19 12:30 PM  Result Value Ref Range   Glucose-Capillary 108 (H) 70 - 99 mg/dL    Comment: Glucose reference range applies only to samples taken after fasting for at least 8 hours.   US Carotid Bilateral  Result Date: 11/09/2019 CLINICAL DATA:  Syncope, collapse.  Hypertension, diabetes. EXAM: BILATERAL CAROTID DUPLEX ULTRASOUND TECHNIQUE: Pearline Cables scale imaging, color Doppler and duplex ultrasound were performed of bilateral carotid and vertebral arteries in the neck. COMPARISON:  None available FINDINGS: Criteria: Quantification of carotid stenosis is based on velocity parameters that correlate the residual internal carotid diameter with NASCET-based stenosis levels, using the diameter of the distal internal carotid lumen as the denominator for stenosis measurement. The following velocity measurements were obtained: RIGHT ICA: 109/31 cm/sec CCA: 409/73 cm/sec SYSTOLIC ICA/CCA RATIO:  0.8 ECA: 151 cm/sec LEFT ICA: 117/28 cm/sec CCA: 532/99 cm/sec SYSTOLIC ICA/CCA RATIO:  0.8 ECA: 131 cm/sec RIGHT CAROTID ARTERY: Minimal plaque in the proximal ICA just beyond its origin without stenosis. Normal waveforms and color Doppler signal. RIGHT VERTEBRAL ARTERY:  Normal flow direction and waveform. LEFT CAROTID ARTERY: Mild eccentric plaque in the bulb and proximal ICA. No high-grade stenosis. Normal waveforms and color Doppler signal. Mild tortuosity. LEFT VERTEBRAL ARTERY:  Normal flow direction and waveform. IMPRESSION: 1. Bilateral carotid plaque resulting in less than 50% diameter ICA stenosis. 2. Antegrade bilateral vertebral arterial flow. Electronically Signed   By: Lucrezia Europe M.D.   On: 11/09/2019 10:04    Pending Labs Unresulted  Labs (From admission, onward)    Start     Ordered   11/16/19 0500  Creatinine, serum  (enoxaparin (LOVENOX)    CrCl >/= 30 ml/min)  Weekly,   STAT    Comments: while on enoxaparin therapy    11/09/19 0205   11/10/19 2426  Basic metabolic panel  Tomorrow morning,   STAT     11/09/19 0826   11/09/19 8341  Basic metabolic panel  Once-Timed,   STAT     11/09/19 0826   11/09/19 0037  SARS CORONAVIRUS 2 (TAT 6-24 HRS) Nasopharyngeal Nasopharyngeal Swab  (Tier 3 (TAT 6-24  hrs))  Once,   STAT    Question Answer Comment  Is this test for diagnosis or screening Screening   Symptomatic for COVID-19 as defined by CDC No   Hospitalized for COVID-19 No   Admitted to ICU for COVID-19 No   Previously tested for COVID-19 Yes   Resident in a congregate (group) care setting No   Employed in healthcare setting No      11/09/19 0036          Vitals/Pain Today's Vitals   11/09/19 1000 11/09/19 1030 11/09/19 1059 11/09/19 1102  BP: 123/68 (!) 115/103  139/71  Pulse: 84 82 75 85  Resp: 18 17 15 19   Temp:      TempSrc:      SpO2: 99% 99% 99% 99%  Weight:      Height:      PainSc:        Isolation Precautions No active isolations  Medications Medications  sodium chloride flush (NS) 0.9 % injection 3 mL (3 mLs Intravenous Not Given 11/09/19 1005)  insulin aspart (novoLOG) injection 0-20 Units (0 Units Subcutaneous Not Given 11/09/19 1231)  enoxaparin (LOVENOX) injection 40 mg (40 mg Subcutaneous Given 11/09/19 0828)  dextrose 5 %-0.45 % sodium chloride infusion ( Intravenous Rate/Dose Verify 11/09/19 1100)  acetaminophen (TYLENOL) tablet 650 mg (has no administration in time range)    Or  acetaminophen (TYLENOL) suppository 650 mg (has no administration in time range)  ondansetron (ZOFRAN) tablet 4 mg (has no administration in time range)    Or  ondansetron (ZOFRAN) injection 4 mg (has no administration in time range)  amLODipine (NORVASC) tablet 10 mg (10 mg Oral Given 11/09/19 0853)   atorvastatin (LIPITOR) tablet 10 mg (10 mg Oral Given 11/09/19 0852)  carvedilol (COREG) tablet 6.25 mg (6.25 mg Oral Given 11/09/19 0853)  calcitRIOL (ROCALTROL) capsule 0.25 mcg (0.25 mcg Oral Given 11/09/19 1004)  dextrose 50 % solution 50 mL (50 mLs Intravenous Given by Other 11/09/19 0830)  dextrose 50 % solution 50 mL (50 mLs Intravenous Given 11/09/19 7262)    Mobility walks Low fall risk   Focused Assessments    R Recommendations: See Admitting Provider Note  Report given to:

## 2019-11-09 NOTE — H&P (Signed)
History and Physical    Benjamin Stewart DDU:[id number] DOB: September 26, 1975 DOA: 11/09/2019  PCP: Glean Hess, MD   Patient coming from: home I have personally briefly reviewed patient's old medical records in [place]  Chief Complaint: fall, lightheadedness  HPI: [name] is a 44 y.o. male with medical history significant for diabetes, hypertension, prostate cancer, morbid obesity and CKD 3, who presents to the emergency room after he suffered a fall at home.  He said while he was urinating he started to feel lightheaded and fell against the wall and then had to sit down against the bathtub.  He denies losing consciousness.  His brother called 37.  Blood sugar reportedly 51 with EMS.  Patient stated that earlier in the day when he went to work he was also feeling lightheaded but he did not check his blood sugar at the time.  He has had no recent change in diet or change in medication.  He did start using a meal plan several months ago that had not changed recently.  He stated that prior to the incident on the day of arrival he was in his usual state of health.  Denies chest pains, shortness of breath, palpitations, headache or blurred vision or unilateral numbness tingling or weakness.  Has not been recently ill  ED Course: On arrival in the emergency room he was afebrile and with normal vitals.  His blood work was significant for a creatinine of 4.74, up from his baseline between 2 and 3.  Labs were otherwise unremarkable.  Blood sugar by arrival in the ER was 134.  He was given D50 and started on D10 infusion.  Hospitalist consulted for admission.  Review of Systems: As per HPI otherwise 10 point review of systems negative.    Past Medical History:  Diagnosis Date  . Benign essential hematuria 12/04/2014   microscopic hematuria evaluation with CT and cysto - treated for 2 months with bactrim DS; no further f/u planned unless hematuria recurs.   . Controlled type 2 diabetes  mellitus without complication, without long-term current use of insulin ([place]) 07/16/2015  . Essential (primary) hypertension 12/04/2014  . History of kidney stones   . prostate cancer   . Sleep apnea     Past Surgical History:  Procedure Laterality Date  . PARTIAL COLECTOMY  2005  . RADIOACTIVE SEED IMPLANT N/A 12/06/2017   Procedure: RADIOACTIVE SEED IMPLANT/BRACHYTHERAPY IMPLANT;  Surgeon: Hollice Espy, MD;  Location: [facility] ORS;  Service: Urology;  Laterality: N/A;     reports that he has never smoked. He has never used smokeless tobacco. He reports that he does not drink alcohol or use drugs.  Allergies  Allergen Reactions  . Percocet [Oxycodone-Acetaminophen] Nausea And Vomiting    Family History  Problem Relation Age of Onset  . Diabetes Mother   . Heart disease Mother   . Hypertension Mother   . Glaucoma Mother   . Prostate cancer Father   . Diabetes Maternal Grandmother   . Diabetes Maternal Grandfather   . Prostate cancer Maternal Uncle      Prior to Admission medications   Medication Sig Start Date End Date Taking? Authorizing Provider  amLODipine (NORVASC) 10 MG tablet TAKE 1 TABLET BY MOUTH EVERY DAY 01/23/19   Glean Hess, MD  atorvastatin (LIPITOR) 10 MG tablet TAKE 1 TABLET BY MOUTH EVERY DAY WITH BREAKFAST 01/23/19   Glean Hess, MD  calcitRIOL (ROCALTROL) 0.25 MCG capsule TK 1 C PO QD  09/22/18   [provider]  carvedilol (COREG) 6.25 MG tablet Take 6.25 mg by mouth 2 (two) times daily with a meal.  03/30/17   [provider]  cloNIDine (CATAPRES - DOSED IN MG/24 HR) 0.2 mg/24hr patch Place 1 patch (0.2 mg total) onto the skin once a week. 08/21/19   Glean Hess, MD  colchicine 0.6 MG tablet One twice a day for gout flare - up to 5 days 04/01/17   Glean Hess, MD  glimepiride (AMARYL) 2 MG tablet TAKE 1 TABLET BY MOUTH EVERY DAY WITH BREAKFAST 06/16/19   Glean Hess, MD  hydrALAZINE (APRESOLINE) 100 MG tablet Take 100  mg by mouth 3 (three) times daily.    [provider]  LOKELMA 10 g PACK packet MIX CONTENTS OF 1 PACKET IN THREE TABLESPOONSFUL OF WATER AND [place] ONCE DAILY 12/10/18   [provider]  olmesartan (BENICAR) 40 MG tablet TAKE 1 TABLET(40 MG) BY MOUTH DAILY 02/14/19   Glean Hess, MD  traMADol (ULTRAM) 50 MG tablet Take 1 tablet (50 mg total) by mouth every 8 (eight) hours as needed. 10/13/19   Benjamin Spikes, DO    Physical Exam: Vitals:   11/09/19 0049 11/09/19 0100 11/09/19 0130 11/09/19 0200  BP:  132/76 128/69 128/74  Pulse:  82 84 80  Resp:  16 14 18   Temp:      TempSrc:      SpO2:  98% 98% 98%  Weight: (!) 155.1 kg     Height: 6\' 3"  (1.905 m)        Vitals:   11/09/19 0049 11/09/19 0100 11/09/19 0130 11/09/19 0200  BP:  132/76 128/69 128/74  Pulse:  82 84 80  Resp:  16 14 18   Temp:      TempSrc:      SpO2:  98% 98% 98%  Weight: (!) 155.1 kg     Height: 6\' 3"  (1.905 m)       Constitutional: Alert and awake, oriented x3, not in any acute distress. Eyes: PERLA, EOMI, irises appear normal, anicteric sclera,  ENMT: external ears and nose appear normal, normal hearing             Lips appears normal, oropharynx mucosa, tongue, posterior pharynx appear normal  Neck: neck appears normal, no masses, normal ROM, no thyromegaly, no JVD  CVS: S1-S2 clear, no murmur rubs or gallops,  , no carotid bruits, pedal pulses palpable, No LE edema Respiratory:  clear to auscultation bilaterally, no wheezing, rales or rhonchi. Respiratory effort normal. No accessory muscle use.  Abdomen: soft nontender, nondistended, normal bowel sounds, no hepatosplenomegaly, no hernias Musculoskeletal: : no cyanosis, clubbing , no contractures or atrophy Neuro: Cranial nerves II-XII intact, sensation, reflexes normal, strength Psych: judgement and insight appear normal, stable mood and affect,  Skin: no rashes or lesions or ulcers, no induration or nodules   Labs on Admission: I have  personally reviewed following labs and imaging studies  CBC: Recent Labs  Lab 11/09/19 0036  WBC 6.6  HGB 10.0*  HCT 31.0*  MCV 89.3  PLT 161   Basic Metabolic Panel: Recent Labs  Lab 11/09/19 0036  NA 136  K 3.3*  CL 104  CO2 23  GLUCOSE 134*  BUN 63*  CREATININE 4.74*  CALCIUM 8.4*   GFR: Estimated Creatinine Clearance: 31 mL/min (A) (by C-G formula based on SCr of 4.74 mg/dL (H)). Liver Function Tests: Recent Labs  Lab 11/09/19 0036  AST  32  ALT 17  ALKPHOS 37*  BILITOT 0.6  PROT 7.1  ALBUMIN 2.7*   No results for input(s): LIPASE, AMYLASE in the last 168 hours. No results for input(s): AMMONIA in the last 168 hours. Coagulation Profile: No results for input(s): INR, PROTIME in the last 168 hours. Cardiac Enzymes: No results for input(s): CKTOTAL, CKMB, CKMBINDEX, TROPONINI in the last 168 hours. BNP (last 3 results) No results for input(s): PROBNP in the last 8760 hours. HbA1C: No results for input(s): HGBA1C in the last 72 hours. CBG: Recent Labs  Lab 11/09/19 0018 11/09/19 0126  GLUCAP 99 200*   Lipid Profile: No results for input(s): CHOL, HDL, LDLCALC, TRIG, CHOLHDL, LDLDIRECT in the last 72 hours. Thyroid Function Tests: No results for input(s): TSH, T4TOTAL, FREET4, T3FREE, THYROIDAB in the last 72 hours. Anemia Panel: No results for input(s): VITAMINB12, FOLATE, FERRITIN, TIBC, IRON, RETICCTPCT in the last 72 hours. Urine analysis:    Component Value Date/Time   COLORURINE STRAW (A) 11/09/2019 0034   APPEARANCEUR CLEAR (A) 11/09/2019 0034   APPEARANCEUR Hazy (A) 10/24/2015 0930   LABSPEC 1.006 11/09/2019 0034   LABSPEC 1.010 11/23/2012 0823   PHURINE 6.0 11/09/2019 0034   GLUCOSEU NEGATIVE 11/09/2019 0034   GLUCOSEU NEGATIVE 11/23/2012 0823   HGBUR MODERATE (A) 11/09/2019 0034   BILIRUBINUR NEGATIVE 11/09/2019 0034   BILIRUBINUR neg 08/21/2019 0851   BILIRUBINUR Negative 10/24/2015 0930   BILIRUBINUR NEGATIVE 11/23/2012 0823    KETONESUR NEGATIVE 11/09/2019 0034   PROTEINUR 100 (A) 11/09/2019 0034   UROBILINOGEN 0.2 08/21/2019 0851   NITRITE NEGATIVE 11/09/2019 0034   LEUKOCYTESUR NEGATIVE 11/09/2019 0034   LEUKOCYTESUR 1+ 11/23/2012 0823    Radiological Exams on Admission: No results found.  EKG: Independently reviewed.   Assessment/Plan Principal Problem:   Postural dizziness with presyncope   Hypoglycemia -Etiology of presyncope suspect related to hypoglycemia, dehydration however given patient's risk factors will do a syncope work-u --Patient noted to be on multiple antihypertensives though was not hypotensive but EMS -Echocardiogram, cardiac monitoring, serial troponins, D-dimer carotid Doppler and head CT -IV hydration with D5 -Monitor blood sugars    Type II diabetes mellitus with complication (HCC) -Given hypoglycemia, hold all home oral hypoglycemics -Regular insulin sliding scale coverage  AKI (acute kidney injury) ([place])   CKD (chronic kidney disease) stage 3, GFR 30-59 ml/min -IV hydration and monitor renal function -Avoid nephrotoxins    Essential (primary) hypertension -Resume amlodipine, carvedilol clonidine and hydralazine as well as olmesartan pending med rec    Morbid obesity, unspecified obesity type ([place]) -This complicates overall prognosis and care    Prostate cancer ([place]) -Was treated with radioactive seed    DVT prophylaxis: Lovenox  Code Status: full code  Family Communication:  none  Disposition Plan: Back to previous home environment Consults called: none  Status:obs    Athena Masse MD [facility]     11/09/2019, 2:06 AM

## 2019-11-09 NOTE — ED Notes (Signed)
Pt remains in room 14 due to echo being performed at bedside. Pt to be moved to cpod after echo complete

## 2019-11-09 NOTE — ED Notes (Signed)
Messaged Admit MD concerning pt's low CBG and actions taken.

## 2019-11-09 NOTE — Progress Notes (Signed)
Pt. Arrived to floor. Assumed care of patient.

## 2019-11-09 NOTE — ED Notes (Signed)
Labs drawn and sent. D10 stopped and D5 .45NS started

## 2019-11-09 NOTE — ED Provider Notes (Signed)
[facility] Emergency Department Provider Note  ____________________________________________   First MD Initiated Contact with Patient 11/09/19 0019     (approximate)  I have reviewed the triage vital signs and the nursing notes.   HISTORY  Chief Complaint Hypoglycemia   HPI Benjamin Stewart is a 44 y.o. male with below list of previous medical conditions including diabetes mellitus which patient is taking Glimepiride presents to the emergency department via EMS secondary to hypoglycemia with a blood glucose of 51 at home.  Patient admits to falling while urinating in the bathroom tonight.  Patient denies any head injury no loss of consciousness.  Patient received D10 and 2 glucose packets while in route to the emergency department with EMS obtaining a glucose of 178 after that intervention.        Past Medical History:  Diagnosis Date  . Benign essential hematuria 12/04/2014   microscopic hematuria evaluation with CT and cysto - treated for 2 months with bactrim DS; no further f/u planned unless hematuria recurs.   . Controlled type 2 diabetes mellitus without complication, without long-term current use of insulin ([place]) 07/16/2015  . Essential (primary) hypertension 12/04/2014  . History of kidney stones   . prostate cancer   . Sleep apnea     Patient Active Problem List   Diagnosis Date Noted  . Secondary hyperparathyroidism of renal origin ([place]) 04/24/2019  . Hyperkalemia 04/24/2019  . Localized edema 01/19/2019  . Chronic gout of multiple sites 08/16/2018  . Proteinuria due to type 2 diabetes mellitus ([place]) 08/22/2017  . Prostate cancer ([place]) 08/23/2015  . Type II diabetes mellitus with complication ([place]) 85/88/5027  . Hyperlipidemia associated with type 2 diabetes mellitus ([place]) 07/16/2015  . Benign essential hematuria 12/04/2014  . Essential (primary) hypertension 12/04/2014  . Morbid obesity, unspecified obesity type ([place]) 12/04/2014     Past Surgical History:  Procedure Laterality Date  . PARTIAL COLECTOMY  2005  . RADIOACTIVE SEED IMPLANT N/A 12/06/2017   Procedure: RADIOACTIVE SEED IMPLANT/BRACHYTHERAPY IMPLANT;  Surgeon: Hollice Espy, MD;  Location: [facility] ORS;  Service: Urology;  Laterality: N/A;    Prior to Admission medications   Medication Sig Start Date End Date Taking? Authorizing Provider  amLODipine (NORVASC) 10 MG tablet TAKE 1 TABLET BY MOUTH EVERY DAY 01/23/19   Glean Hess, MD  atorvastatin (LIPITOR) 10 MG tablet TAKE 1 TABLET BY MOUTH EVERY DAY WITH BREAKFAST 01/23/19   Glean Hess, MD  calcitRIOL (ROCALTROL) 0.25 MCG capsule TK 1 C PO QD 09/22/18   [provider]  carvedilol (COREG) 6.25 MG tablet Take 6.25 mg by mouth 2 (two) times daily with a meal.  03/30/17   [provider]  cloNIDine (CATAPRES - DOSED IN MG/24 HR) 0.2 mg/24hr patch Place 1 patch (0.2 mg total) onto the skin once a week. 08/21/19   Glean Hess, MD  colchicine 0.6 MG tablet One twice a day for gout flare - up to 5 days 04/01/17   Glean Hess, MD  glimepiride (AMARYL) 2 MG tablet TAKE 1 TABLET BY MOUTH EVERY DAY WITH BREAKFAST 06/16/19   Glean Hess, MD  hydrALAZINE (APRESOLINE) 100 MG tablet Take 100 mg by mouth 3 (three) times daily.    [provider]  LOKELMA 10 g PACK packet MIX CONTENTS OF 1 PACKET IN THREE TABLESPOONSFUL OF WATER AND DRINK ONCE DAILY 12/10/18   [provider]  olmesartan (BENICAR) 40 MG tablet TAKE 1 TABLET(40 MG) BY MOUTH DAILY  02/14/19   Glean Hess, MD  traMADol (ULTRAM) 50 MG tablet Take 1 tablet (50 mg total) by mouth every 8 (eight) hours as needed. 10/13/19   Coral Spikes, DO    Allergies Percocet [oxycodone-acetaminophen]  Family History  Problem Relation Age of Onset  . Diabetes Mother   . Heart disease Mother   . Hypertension Mother   . Glaucoma Mother   . Prostate cancer Father   . Diabetes Maternal Grandmother   . Diabetes  Maternal Grandfather   . Prostate cancer Maternal Uncle     Social History Social History   Tobacco Use  . Smoking status: Never Smoker  . Smokeless tobacco: Never Used  Substance Use Topics  . Alcohol use: No    Alcohol/week: 0.0 standard drinks  . Drug use: No    Review of Systems Constitutional: No fever/chills Eyes: No visual changes. ENT: No sore throat. Cardiovascular: Denies chest pain. Respiratory: Denies shortness of breath. Gastrointestinal: No abdominal pain.  No nausea, no vomiting.  No diarrhea.  No constipation. Genitourinary: Negative for dysuria. Musculoskeletal: Negative for neck pain.  Negative for back pain. Integumentary: Negative for rash. Neurological: Negative for headaches, focal weakness or numbness. Endocrine:  Positive for hypoglycemia   ____________________________________________   PHYSICAL EXAM:  VITAL SIGNS: ED Triage Vitals  Enc Vitals Group     BP 11/09/19 0024 135/74     Pulse Rate 11/09/19 0024 95     Resp 11/09/19 0024 16     Temp 11/09/19 0024 (!) 97.5 F (36.4 C)     Temp Source 11/09/19 0024 Oral     SpO2 11/09/19 0024 98 %     Weight 11/09/19 0049 (!) 155.1 kg (342 lb)     Height 11/09/19 0049 1.905 m (6\' 3" )     Head Circumference --      Peak Flow --      Pain Score --      Pain Loc --      Pain Edu? --      Excl. in [place]? --     Constitutional: Alert and oriented.  Eyes: Conjunctivae are normal.  Head: Atraumatic. Mouth/Throat: Patient is wearing a mask. Neck: No stridor.  No meningeal signs.   Cardiovascular: Normal rate, regular rhythm. Good peripheral circulation. Grossly normal heart sounds. Respiratory: Normal respiratory effort.  No retractions. Gastrointestinal: Soft and nontender. No distention.  Musculoskeletal: No lower extremity tenderness nor edema. No gross deformities of extremities. Neurologic:  Normal speech and language. No gross focal neurologic deficits are appreciated.  Skin:  Skin is warm,  dry and intact. Psychiatric: Mood and affect are normal. Speech and behavior are normal.  ____________________________________________   LABS (all labs ordered are listed, but only abnormal results are displayed)  Labs Reviewed  CBC - Abnormal; Notable for the following components:      Result Value   RBC 3.47 (*)    Hemoglobin 10.0 (*)    HCT 31.0 (*)    All other components within normal limits  COMPREHENSIVE METABOLIC PANEL - Abnormal; Notable for the following components:   Potassium 3.3 (*)    Glucose, Bld 134 (*)    BUN 63 (*)    Creatinine, Ser 4.74 (*)    Calcium 8.4 (*)    Albumin 2.7 (*)    Alkaline Phosphatase 37 (*)    GFR calc non Af Amer 14 (*)    GFR calc Af Amer 16 (*)    All other components within  normal limits  URINALYSIS, COMPLETE (UACMP) WITH MICROSCOPIC - Abnormal; Notable for the following components:   Color, Urine STRAW (*)    APPearance CLEAR (*)    Hgb urine dipstick MODERATE (*)    Protein, ur 100 (*)    All other components within normal limits  GLUCOSE, CAPILLARY - Abnormal; Notable for the following components:   Glucose-Capillary 200 (*)    All other components within normal limits  SARS CORONAVIRUS 2 (TAT 6-24 HRS)  GLUCOSE, CAPILLARY   ____________________________________________  EKG  ED ECG REPORT I, Leonard N Aster Eckrich, the attending physician, personally viewed and interpreted this ECG.   Date: 11/09/2019  EKG Time: 12:20 AM  Rate: 93  Rhythm: Normal sinus rhythm  Axis: Normal  Intervals: Normal  ST&T Change: None    Procedures   ____________________________________________   INITIAL IMPRESSION / MDM / ASSESSMENT AND PLAN / ED COURSE  As part of my medical decision making, I reviewed the following data within the electronic MEDICAL RECORD NUMBER  44 year old male presented with above-stated history and physical exam secondary to hypoglycemia that is currently taking glimepiride.  On arrival to the emergency department  patient's glucose dropped from 178 after EMS intervention to 99 here in the ED.  D10 normal saline initiated.  Patient's laboratory data notable for a creatinine of 4.74 with a BUN of 63 comparison from 08/21/2019 revealed a BUN of 45 and a creatinine of 2.94.  Patient discussed with Dr. Damita Dunnings hospitalist for hospital admission further evaluation and management for persistent hypoglycemia and acute kidney injury ____________________________________________  FINAL CLINICAL IMPRESSION(S) / ED DIAGNOSES  Final diagnoses:  Hypoglycemia  Acute kidney injury (AKI) with acute tubular necrosis (ATN) ([place])     MEDICATIONS GIVEN DURING THIS VISIT:  Medications  dextrose 10 % infusion (1,000 mLs Intravenous New Bag/Given 11/09/19 0030)  dextrose 50 % solution 50 mL (50 mLs Intravenous Given 11/09/19 0030)     ED Discharge Orders    None      *Please note:  DILRAJ KILLGORE was evaluated in Emergency Department on 11/09/2019 for the symptoms described in the history of present illness. He was evaluated in the context of the global COVID-19 pandemic, which necessitated consideration that the patient might be at risk for infection with the SARS-CoV-2 virus that causes COVID-19. Institutional protocols and algorithms that pertain to the evaluation of patients at risk for COVID-19 are in a state of rapid change based on information released by regulatory bodies including the CDC and federal and state organizations. These policies and algorithms were followed during the patient's care in the ED.  Some ED evaluations and interventions may be delayed as a result of limited staffing during the pandemic.*  Note:  This document was prepared using Dragon voice recognition software and may include unintentional dictation errors.   Gregor Hams, MD 11/09/19 [phone]

## 2019-11-09 NOTE — ED Triage Notes (Addendum)
Pt to the er for hypoglycemia via [place]. Pt BG was 51 at home. Pt did fall while in the bathroom urinating. Pt denies pain at this time. Pt received D10 and 2 packets of glucose. Blood sugar 178 with ems. Pt had lost weight and had come off of his metformin. No trouble prior to today.

## 2019-11-09 NOTE — Progress Notes (Addendum)
Patient's sugar was 71. Juice given to prevent it from dropping further. IVF infusing. Orthostatic vital signs completed (see flowsheet). MD notified.

## 2019-11-10 ENCOUNTER — Observation Stay: Payer: 59

## 2019-11-10 DIAGNOSIS — Z9049 Acquired absence of other specified parts of digestive tract: Secondary | ICD-10-CM | POA: Diagnosis not present

## 2019-11-10 DIAGNOSIS — U071 COVID-19: Secondary | ICD-10-CM | POA: Diagnosis present

## 2019-11-10 DIAGNOSIS — Z833 Family history of diabetes mellitus: Secondary | ICD-10-CM | POA: Diagnosis not present

## 2019-11-10 DIAGNOSIS — Z6841 Body Mass Index (BMI) 40.0 and over, adult: Secondary | ICD-10-CM | POA: Diagnosis not present

## 2019-11-10 DIAGNOSIS — N2581 Secondary hyperparathyroidism of renal origin: Secondary | ICD-10-CM | POA: Diagnosis present

## 2019-11-10 DIAGNOSIS — N170 Acute kidney failure with tubular necrosis: Secondary | ICD-10-CM | POA: Diagnosis present

## 2019-11-10 DIAGNOSIS — R42 Dizziness and giddiness: Secondary | ICD-10-CM | POA: Diagnosis not present

## 2019-11-10 DIAGNOSIS — E11649 Type 2 diabetes mellitus with hypoglycemia without coma: Secondary | ICD-10-CM | POA: Diagnosis present

## 2019-11-10 DIAGNOSIS — I129 Hypertensive chronic kidney disease with stage 1 through stage 4 chronic kidney disease, or unspecified chronic kidney disease: Secondary | ICD-10-CM | POA: Diagnosis present

## 2019-11-10 DIAGNOSIS — W19XXXA Unspecified fall, initial encounter: Secondary | ICD-10-CM | POA: Diagnosis present

## 2019-11-10 DIAGNOSIS — Z8249 Family history of ischemic heart disease and other diseases of the circulatory system: Secondary | ICD-10-CM | POA: Diagnosis not present

## 2019-11-10 DIAGNOSIS — E876 Hypokalemia: Secondary | ICD-10-CM | POA: Diagnosis present

## 2019-11-10 DIAGNOSIS — E1122 Type 2 diabetes mellitus with diabetic chronic kidney disease: Secondary | ICD-10-CM | POA: Diagnosis present

## 2019-11-10 DIAGNOSIS — E1169 Type 2 diabetes mellitus with other specified complication: Secondary | ICD-10-CM | POA: Diagnosis present

## 2019-11-10 DIAGNOSIS — Z8042 Family history of malignant neoplasm of prostate: Secondary | ICD-10-CM | POA: Diagnosis not present

## 2019-11-10 DIAGNOSIS — N184 Chronic kidney disease, stage 4 (severe): Secondary | ICD-10-CM | POA: Diagnosis present

## 2019-11-10 DIAGNOSIS — R7989 Other specified abnormal findings of blood chemistry: Secondary | ICD-10-CM | POA: Diagnosis present

## 2019-11-10 DIAGNOSIS — Z885 Allergy status to narcotic agent status: Secondary | ICD-10-CM | POA: Diagnosis not present

## 2019-11-10 DIAGNOSIS — G4730 Sleep apnea, unspecified: Secondary | ICD-10-CM | POA: Diagnosis present

## 2019-11-10 DIAGNOSIS — Z79899 Other long term (current) drug therapy: Secondary | ICD-10-CM | POA: Diagnosis not present

## 2019-11-10 DIAGNOSIS — Z83511 Family history of glaucoma: Secondary | ICD-10-CM | POA: Diagnosis not present

## 2019-11-10 DIAGNOSIS — Y92009 Unspecified place in unspecified non-institutional (private) residence as the place of occurrence of the external cause: Secondary | ICD-10-CM | POA: Diagnosis not present

## 2019-11-10 DIAGNOSIS — Z87442 Personal history of urinary calculi: Secondary | ICD-10-CM | POA: Diagnosis not present

## 2019-11-10 DIAGNOSIS — T39315A Adverse effect of propionic acid derivatives, initial encounter: Secondary | ICD-10-CM | POA: Diagnosis present

## 2019-11-10 DIAGNOSIS — C61 Malignant neoplasm of prostate: Secondary | ICD-10-CM | POA: Diagnosis present

## 2019-11-10 DIAGNOSIS — E162 Hypoglycemia, unspecified: Secondary | ICD-10-CM | POA: Diagnosis not present

## 2019-11-10 DIAGNOSIS — R55 Syncope and collapse: Secondary | ICD-10-CM | POA: Diagnosis present

## 2019-11-10 DIAGNOSIS — N179 Acute kidney failure, unspecified: Secondary | ICD-10-CM | POA: Diagnosis not present

## 2019-11-10 LAB — GLUCOSE, CAPILLARY
Glucose-Capillary: 150 mg/dL — ABNORMAL HIGH (ref 70–99)
Glucose-Capillary: 150 mg/dL — ABNORMAL HIGH (ref 70–99)
Glucose-Capillary: 97 mg/dL (ref 70–99)
Glucose-Capillary: 177 mg/dL — ABNORMAL HIGH (ref 70–99)

## 2019-11-10 LAB — BASIC METABOLIC PANEL
Anion gap: 8 (ref 5–15)
BUN: 56 mg/dL — ABNORMAL HIGH (ref 6–20)
CO2: 24 mmol/L (ref 22–32)
Calcium: 8.5 mg/dL — ABNORMAL LOW (ref 8.9–10.3)
Chloride: 112 mmol/L — ABNORMAL HIGH (ref 98–111)
Creatinine, Ser: 3.82 mg/dL — ABNORMAL HIGH (ref 0.61–1.24)
GFR calc Af Amer: 21 mL/min — ABNORMAL LOW (ref >60)
GFR calc non Af Amer: 18 mL/min — ABNORMAL LOW (ref >60)
Glucose, Bld: 175 mg/dL — ABNORMAL HIGH (ref 70–99)
Potassium: 3.3 mmol/L — ABNORMAL LOW (ref 3.5–5.1)
Sodium: 144 mmol/L (ref 135–145)

## 2019-11-10 MED ORDER — ENOXAPARIN SODIUM 40 MG/0.4ML ~~LOC~~ SOLN
40.0000 mg | Freq: Two times a day (BID) | SUBCUTANEOUS | Status: DC
  Administered 2019-11-10 – 2019-11-11 (×3): 40 mg via SUBCUTANEOUS
  Filled 2019-11-10 (×3): qty 0.4

## 2019-11-10 MED ORDER — POTASSIUM CHLORIDE CRYS ER 20 MEQ PO TBCR
40.0000 meq | EXTENDED_RELEASE_TABLET | Freq: Once | ORAL | Status: AC
  Administered 2019-11-10: 40 meq via ORAL
  Filled 2019-11-10: qty 2

## 2019-11-10 MED ORDER — TECHNETIUM TO 99M ALBUMIN AGGREGATED
3.9600 | Freq: Once | INTRAVENOUS | Status: AC | PRN
  Administered 2019-11-10: 3.96 via INTRAVENOUS

## 2019-11-10 NOTE — Progress Notes (Signed)
PROGRESS NOTE    Benjamin Stewart  ZWC:[id number]  DOB: 1975-10-12  DOA: 11/09/2019 PCP: Glean Hess, MD Outpatient Specialists:   Hospital course:  44 year old with DM2, HTN, obesity, CKD 3, prostate cancer was admitted 11/09/19 with syncope possibly secondary to hypoglycemia with blood sugar 51.  In ED patient was noted to be in acute kidney failure with creatinine of 4 up from 2.5.   Subjective:  Patient states that he feels okay, has not really gotten up out of bed but does not feel dizzy.  On further discussion patient remembers that he took ibuprofen 400 mg a day for 3 days about 2 weeks ago for his wisdom teeth.   Objective: Vitals:   11/09/19 2105 11/09/19 2357 11/10/19 0512 11/10/19 0830  BP: (!) 115/58 106/62 (!) 173/70 (!) 148/90  Pulse:  89  86  Resp:  17  16  Temp:  98.2 F (36.8 C)  98.5 F (36.9 C)  TempSrc:    Oral  SpO2:  100%  98%  Weight:      Height:        Intake/Output Summary (Last 24 hours) at 11/10/2019 1824 Last data filed at 11/10/2019 1500 Gross per 24 hour  Intake 1130.66 ml  Output 2050 ml  Net -919.34 ml   Filed Weights   11/09/19 0049 11/09/19 1407  Weight: (!) 155.1 kg (!) 154.3 kg     Assessment & Plan:   Syncope I do suspect that patient's syncope/presyncope was secondary to hypoglycemia. Work-up including carotid Dopplers is negative Echocardiogram is normal with a EF of 60 to 65% and no lAS. Perfusion scan is also without any evidence of perfusion defects.  Elevated D-dimer As noted above perfusion scan is without any perfusion deficits. Most likely elevated secondary to COVID-19  Hypoglycemia/DM 2 Suspect patient's hypoglycemia is secondary to his acute renal failure and prolonged action of glimepiride. Patient remains low normal blood sugars on D5 at 100 cc an hour. We will continue D5 overnight and follow blood sugars tomorrow.  AKI Discussed with Dr. Zollie Scale who is patient's outpatient nephrologist, its very  possible that ibuprofen he took 2 weeks ago is what caused his acute kidney injury.  Creatinine is improving somewhat with hydration.  We will continue to follow.  Hypokalemia We will replete and recheck  HTN Well controlled on present meds, continue to hold all losartan  COVID-19 Asymptomatic without hypoxia. Not on steroids or remdesivir as he is asymptomatic.   DVT prophylaxis: Heparin Code Status: Full code Family Communication: Patient states he is in communication with his family Disposition Plan:   Patient is from: Home  Anticipated Discharge Location: Home  Barriers to Discharge: Persistent hypoglycemia  Is patient medically stable for Discharge: Not yet   Consultants:  None, curbside Dr. Zollie Scale who is his outpatient nephrologist  Procedures:  None  Antimicrobials:  None   Exam:  General: Obese man lying flat in bed in no acute distress. Eyes: sclera anicteric, conjuctiva mild injection bilaterally CVS: S1-S2, regular  Respiratory:  decreased air entry bilaterally secondary to decreased inspiratory effort, rales at bases  GI: Obese, NABS, soft, NT  LE: No edema.  Neuro: A/O x 3, Moving all extremities equally with normal strength, CN 3-12 intact, grossly nonfocal.  Psych: patient is logical and coherent, judgement and insight appear normal, mood and affect appropriate to situation.   Data Reviewed: Basic Metabolic Panel: Recent Labs  Lab 11/09/19 0036 11/09/19 1419 11/10/19 0527  NA 136 141 144  K 3.3* 3.4* 3.3*  CL 104 107 112*  CO2 23 22 24   GLUCOSE 134* 100* 175*  BUN 63* 62* 56*  CREATININE 4.74* 4.32* 3.82*  CALCIUM 8.4* 8.5* 8.5*   Liver Function Tests: Recent Labs  Lab 11/09/19 0036  AST 32  ALT 17  ALKPHOS 37*  BILITOT 0.6  PROT 7.1  ALBUMIN 2.7*   No results for input(s): LIPASE, AMYLASE in the last 168 hours. No results for input(s): AMMONIA in the last 168 hours. CBC: Recent Labs  Lab 11/09/19 0036  WBC 6.6  HGB 10.0*   HCT 31.0*  MCV 89.3  PLT 338   Cardiac Enzymes: Recent Labs  Lab 11/09/19 0245  CKTOTAL 543*   BNP (last 3 results) No results for input(s): PROBNP in the last 8760 hours. CBG: Recent Labs  Lab 11/09/19 1729 11/09/19 2220 11/10/19 0853 11/10/19 1143 11/10/19 1653  GLUCAP 105* 82 150* 150* 97    Recent Results (from the past 240 hour(s))  SARS CORONAVIRUS 2 (TAT 6-24 HRS) Nasopharyngeal Nasopharyngeal Swab     Status: Abnormal   Collection Time: 11/09/19 12:37 AM   Specimen: Nasopharyngeal Swab  Result Value Ref Range Status   SARS Coronavirus 2 POSITIVE (A) NEGATIVE Final    Comment: RESULT CALLED TO, READ BACK BY AND VERIFIED WITH: B. Alejo RN 14:40 11/09/19 ([email or web address]) (NOTE) SARS-CoV-2 target nucleic acids are DETECTED. The SARS-CoV-2 RNA is generally detectable in upper and lower respiratory specimens during the acute phase of infection. Positive results are indicative of the presence of SARS-CoV-2 RNA. Clinical correlation with patient history and other diagnostic information is  necessary to determine patient infection status. Positive results do not rule out bacterial infection or co-infection with other viruses.  The expected result is Negative. Fact Sheet for Patients: SugarRoll.be Fact Sheet for Healthcare Providers: https://www.woods-mathews.com/ This test is not yet approved or cleared by the [place] FDA and  has been authorized for detection and/or diagnosis of SARS-CoV-2 by FDA under an Emergency Use Authorization (EUA). This EUA will remain  in effect (meaning this test can be used) for the  duration of the COVID-19 declaration under Section 564(b)(1) of the Act, 21 U.S.C. section 360bbb-3(b)(1), unless the authorization is terminated or revoked sooner. Performed at [facility], [place] [address] [place], [place] [address]       Studies: NM Pulmonary Perfusion  Result Date: 11/10/2019 CLINICAL  DATA:  Elevated D-dimer and syncope.  COVID-19 positive EXAM: NUCLEAR MEDICINE PERFUSION LUNG SCAN TECHNIQUE: Perfusion images were obtained in multiple projections after intravenous injection of radiopharmaceutical. Ventilation scans intentionally deferred if perfusion scan and chest x-ray adequate for interpretation during COVID 19 epidemic. Views: Anterior, posterior, left lateral, right lateral, RPO, LPO, RAO, LAO RADIOPHARMACEUTICALS:  3.96 mCi Tc-69m MAA IV COMPARISON:  Chest radiograph November 10, 2019 FINDINGS: Radiotracer uptake is homogeneous and symmetric bilaterally. No appreciable perfusion defects evident. IMPRESSION: No appreciable perfusion defects. Very low probability of pulmonary embolus. Electronically Signed   By: Lowella Grip III M.D.   On: 11/10/2019 13:22   US Carotid Bilateral  Result Date: 11/09/2019 CLINICAL DATA:  Syncope, collapse.  Hypertension, diabetes. EXAM: BILATERAL CAROTID DUPLEX ULTRASOUND TECHNIQUE: Pearline Cables scale imaging, color Doppler and duplex ultrasound were performed of bilateral carotid and vertebral arteries in the neck. COMPARISON:  None available FINDINGS: Criteria: Quantification of carotid stenosis is based on velocity parameters that correlate the residual internal carotid diameter with NASCET-based stenosis levels, using the diameter of the distal internal carotid lumen  as the denominator for stenosis measurement. The following velocity measurements were obtained: RIGHT ICA: 109/31 cm/sec CCA: 240/97 cm/sec SYSTOLIC ICA/CCA RATIO:  0.8 ECA: 151 cm/sec LEFT ICA: 117/28 cm/sec CCA: 353/29 cm/sec SYSTOLIC ICA/CCA RATIO:  0.8 ECA: 131 cm/sec RIGHT CAROTID ARTERY: Minimal plaque in the proximal ICA just beyond its origin without stenosis. Normal waveforms and color Doppler signal. RIGHT VERTEBRAL ARTERY:  Normal flow direction and waveform. LEFT CAROTID ARTERY: Mild eccentric plaque in the bulb and proximal ICA. No high-grade stenosis. Normal waveforms and color  Doppler signal. Mild tortuosity. LEFT VERTEBRAL ARTERY:  Normal flow direction and waveform. IMPRESSION: 1. Bilateral carotid plaque resulting in less than 50% diameter ICA stenosis. 2. Antegrade bilateral vertebral arterial flow. Electronically Signed   By: Lucrezia Europe M.D.   On: 11/09/2019 10:04   US Venous Img Lower Bilateral (DVT)  Result Date: 11/10/2019 CLINICAL DATA:  44 year old male with syncope, lower extremity pain and swelling EXAM: BILATERAL LOWER EXTREMITY VENOUS DOPPLER ULTRASOUND TECHNIQUE: Gray-scale sonography with graded compression, as well as color Doppler and duplex ultrasound were performed to evaluate the lower extremity deep venous systems from the level of the common femoral vein and including the common femoral, femoral, profunda femoral, popliteal and calf veins including the posterior tibial, peroneal and gastrocnemius veins when visible. The superficial great saphenous vein was also interrogated. Spectral Doppler was utilized to evaluate flow at rest and with distal augmentation maneuvers in the common femoral, femoral and popliteal veins. COMPARISON:  None. FINDINGS: RIGHT LOWER EXTREMITY Common Femoral Vein: No evidence of thrombus. Normal compressibility, respiratory phasicity and response to augmentation. Saphenofemoral Junction: No evidence of thrombus. Normal compressibility and flow on color Doppler imaging. Profunda Femoral Vein: No evidence of thrombus. Normal compressibility and flow on color Doppler imaging. Femoral Vein: No evidence of thrombus. Normal compressibility, respiratory phasicity and response to augmentation. Popliteal Vein: No evidence of thrombus. Normal compressibility, respiratory phasicity and response to augmentation. Calf Veins: No evidence of thrombus. Normal compressibility and flow on color Doppler imaging. Superficial Great Saphenous Vein: No evidence of thrombus. Normal compressibility. Venous Reflux:  None. Other Findings:  None. LEFT LOWER  EXTREMITY Common Femoral Vein: No evidence of thrombus. Normal compressibility, respiratory phasicity and response to augmentation. Saphenofemoral Junction: No evidence of thrombus. Normal compressibility and flow on color Doppler imaging. Profunda Femoral Vein: No evidence of thrombus. Normal compressibility and flow on color Doppler imaging. Femoral Vein: No evidence of thrombus. Normal compressibility, respiratory phasicity and response to augmentation. Popliteal Vein: No evidence of thrombus. Normal compressibility, respiratory phasicity and response to augmentation. Calf Veins: No evidence of thrombus. Normal compressibility and flow on color Doppler imaging. Superficial Great Saphenous Vein: No evidence of thrombus. Normal compressibility. Venous Reflux:  None. Other Findings:  None. IMPRESSION: No evidence of deep venous thrombosis in either lower extremity. Electronically Signed   By: Jacqulynn Cadet M.D.   On: 11/10/2019 10:06   DG Chest Port 1 View  Result Date: 11/10/2019 CLINICAL DATA:  History of VQ scan. Additional history provided by technologist: Patient admitted yesterday for hypoglycemia. History of diabetes mellitus, hypertension. EXAM: PORTABLE CHEST 1 VIEW COMPARISON:  No pertinent prior studies available for comparison. FINDINGS: Heart size within normal limits. No evidence of airspace consolidation within the lungs. No evidence of pleural effusion or pneumothorax. No acute bony abnormality is identified. Thoracic spondylosis. IMPRESSION: No evidence of acute cardiopulmonary abnormality. Electronically Signed   By: Kellie Simmering DO   On: 11/10/2019 08:08   ECHOCARDIOGRAM COMPLETE  Result  Date: 11/09/2019    ECHOCARDIOGRAM REPORT   Patient Name:   Benjamin Stewart Date of Exam: 11/09/2019 Medical Rec #:  [id number]        Height:       75.0 in Accession #:    [id number]       Weight:       342.0 lb Date of Birth:  06/23/1976         BSA:          2.756 m Patient Age:    63 years         BP:            129/81 mmHg Patient Gender: M                HR:           82 bpm. Exam Location:  [facility] Procedure: 2D Echo, Color Doppler, Cardiac Doppler and Intracardiac            Opacification Agent Indications:     R55 Syncope  History:         Patient has no prior history of Echocardiogram examinations.                  CKD; Risk Factors:Hypertension and Diabetes.  Sonographer:     Charmayne Sheer RDCS (AE) Referring Phys:  [id number] Athena Masse Diagnosing Phys: Ida Rogue MD  Sonographer Comments: No subcostal window and suboptimal apical window. Image acquisition challenging due to patient body habitus. IMPRESSIONS  1. Left ventricular ejection fraction, by estimation, is 60 to 65%. The left ventricle has normal function. The left ventricle has no regional wall motion abnormalities. Left ventricular diastolic parameters were normal.  2. Right ventricular systolic function is normal. The right ventricular size is normal.  3. The mitral valve is normal in structure. No evidence of mitral valve regurgitation. No evidence of mitral stenosis. FINDINGS  Left Ventricle: Left ventricular ejection fraction, by estimation, is 60 to 65%. The left ventricle has normal function. The left ventricle has no regional wall motion abnormalities. Definity contrast agent was given IV to delineate the left ventricular  endocardial borders. The left ventricular internal cavity size was normal in size. There is no left ventricular hypertrophy. Left ventricular diastolic parameters were normal. Right Ventricle: The right ventricular size is normal. No increase in right ventricular wall thickness. Right ventricular systolic function is normal. Left Atrium: Left atrial size was normal in size. Right Atrium: Right atrial size was normal in size. Pericardium: There is no evidence of pericardial effusion. Mitral Valve: The mitral valve is normal in structure. Normal mobility of the mitral valve leaflets. No evidence of mitral valve  regurgitation. No evidence of mitral valve stenosis. MV peak gradient, 6.2 mmHg. The mean mitral valve gradient is 3.0 mmHg. Tricuspid Valve: The tricuspid valve is normal in structure. Tricuspid valve regurgitation is not demonstrated. No evidence of tricuspid stenosis. Aortic Valve: The aortic valve is normal in structure. Aortic valve regurgitation is not visualized. No aortic stenosis is present. Aortic valve mean gradient measures 8.0 mmHg. Aortic valve peak gradient measures 15.1 mmHg. Aortic valve area, by VTI measures 3.44 cm. Pulmonic Valve: The pulmonic valve was normal in structure. Pulmonic valve regurgitation is not visualized. No evidence of pulmonic stenosis. Aorta: The aortic root is normal in size and structure. Venous: The inferior vena cava is normal in size with greater than 50% respiratory variability, suggesting right atrial pressure of 3 mmHg. IAS/Shunts: No atrial level  shunt detected by color flow Doppler.  LEFT VENTRICLE PLAX 2D LVIDd:         5.56 cm  Diastology LVIDs:         3.08 cm  LV e' lateral:   11.60 cm/s LV PW:         1.15 cm  LV E/e' lateral: 8.6 LV IVS:        1.12 cm  LV e' medial:    11.30 cm/s LVOT diam:     2.40 cm  LV E/e' medial:  8.8 LV SV:         117 LV SV Index:   42 LVOT Area:     4.52 cm  LEFT ATRIUM             Index LA diam:        4.40 cm 1.60 cm/m LA Vol (A2C):   37.6 ml 13.64 ml/m LA Vol (A4C):   54.3 ml 19.70 ml/m LA Biplane Vol: 46.4 ml 16.83 ml/m  AORTIC VALVE                    PULMONIC VALVE AV Area (Vmax):    3.19 cm     PV Vmax:       1.49 m/s AV Area (Vmean):   3.06 cm     PV Vmean:      99.700 cm/s AV Area (VTI):     3.44 cm     PV VTI:        0.286 m AV Vmax:           194.00 cm/s  PV Peak grad:  8.9 mmHg AV Vmean:          135.000 cm/s PV Mean grad:  5.0 mmHg AV VTI:            0.339 m AV Peak Grad:      15.1 mmHg AV Mean Grad:      8.0 mmHg LVOT Vmax:         137.00 cm/s LVOT Vmean:        91.400 cm/s LVOT VTI:          0.258 m LVOT/AV VTI  ratio: 0.76  AORTA Ao Root diam: 3.50 cm MITRAL VALVE MV Area (PHT): 6.17 cm    SHUNTS MV Peak grad:  6.2 mmHg    Systemic VTI:  0.26 m MV Mean grad:  3.0 mmHg    Systemic Diam: 2.40 cm MV Vmax:       1.25 m/s MV Vmean:      80.8 cm/s MV Decel Time: 123 msec MV E velocity: 99.90 cm/s MV A velocity: 95.10 cm/s MV E/A ratio:  1.05 Ida Rogue MD Electronically signed by Ida Rogue MD Signature Date/Time: 11/09/2019/6:03:04 PM    Final      Scheduled Meds: . amLODipine  10 mg Oral Daily  . atorvastatin  10 mg Oral Daily  . calcitRIOL  0.25 mcg Oral Daily  . carvedilol  6.25 mg Oral BID WC  . enoxaparin (LOVENOX) injection  40 mg Subcutaneous Q12H  . hydrALAZINE  100 mg Oral TID  . insulin aspart  0-20 Units Subcutaneous TID WC  . sodium chloride flush  3 mL Intravenous Q12H   Continuous Infusions: . dextrose 5% lactated ringers 100 mL/hr at 11/10/19 1720    Principal Problem:   Postural dizziness with presyncope Active Problems:   Essential (primary) hypertension   Morbid obesity, unspecified obesity type (HCC)   Type  II diabetes mellitus with complication (HCC)   Prostate cancer ([place])   AKI (acute kidney injury) ([place])   CKD (chronic kidney disease) stage 3, GFR 30-59 ml/min   Hypoglycemia     Kamerin Grumbine Derek Jack, [facility]  If 7PM-7AM, please contact night-coverage [email or web address] Password [facility] 11/10/2019, 6:24 PM    LOS: 0 days

## 2019-11-10 NOTE — Progress Notes (Signed)
PHARMACIST - PHYSICIAN COMMUNICATION  CONCERNING:  Enoxaparin (Lovenox) for DVT Prophylaxis    RECOMMENDATION: Patient was prescribed enoxaprin 40mg  q24 hours for VTE prophylaxis.   Filed Weights   11/09/19 0049 11/09/19 1407  Weight: (!) 155.1 kg (342 lb) (!) 154.3 kg (340 lb 3.2 oz)    Body mass index is 46.14 kg/m.  Estimated Creatinine Clearance: 37 mL/min (A) (by C-G formula based on SCr of 3.82 mg/dL (H)).   Based on Price patient is candidate for enoxaparin 40mg  every 12 hour dosing due to BMI being >40.   DESCRIPTION: Pharmacy has adjusted enoxaparin dose per [facility] policy.  Patient is now receiving enoxaparin 40mg  every 12 hours.   Lu Duffel, PharmD, BCPS Clinical Pharmacist 11/10/2019 8:41 AM

## 2019-11-11 LAB — BASIC METABOLIC PANEL
Anion gap: 8 (ref 5–15)
BUN: 50 mg/dL — ABNORMAL HIGH (ref 6–20)
CO2: 23 mmol/L (ref 22–32)
Calcium: 8.6 mg/dL — ABNORMAL LOW (ref 8.9–10.3)
Chloride: 112 mmol/L — ABNORMAL HIGH (ref 98–111)
Creatinine, Ser: 3.38 mg/dL — ABNORMAL HIGH (ref 0.61–1.24)
GFR calc Af Amer: 24 mL/min — ABNORMAL LOW (ref >60)
GFR calc non Af Amer: 21 mL/min — ABNORMAL LOW (ref >60)
Glucose, Bld: 153 mg/dL — ABNORMAL HIGH (ref 70–99)
Potassium: 3.6 mmol/L (ref 3.5–5.1)
Sodium: 143 mmol/L (ref 135–145)

## 2019-11-11 LAB — GLUCOSE, CAPILLARY
Glucose-Capillary: 139 mg/dL — ABNORMAL HIGH (ref 70–99)
Glucose-Capillary: 134 mg/dL — ABNORMAL HIGH (ref 70–99)
Glucose-Capillary: 119 mg/dL — ABNORMAL HIGH (ref 70–99)
Glucose-Capillary: 155 mg/dL — ABNORMAL HIGH (ref 70–99)

## 2019-11-11 LAB — MAGNESIUM: Magnesium: 2.1 mg/dL (ref 1.7–2.4)

## 2019-11-11 NOTE — Discharge Summary (Signed)
Benjamin Stewart KCM:[id number] DOB: [birth year] DOA: 11/09/2019  PCP: Glean Hess, MD  Admit date: 11/09/2019  Discharge date: 11/11/2019  Admitted From: Home   disposition: Home   Recommendations for Outpatient Follow-up:   Follow up with PCP in 1-2 days to discuss how to manage your diabetes with worsened kidney function.  PCP Please obtain: BMP to check his creatinine.  Please also look at fingersticks/blood glucose data since he has been taken off glimepiride.  (see Discharge instructions)    Home Health: None Equipment/Devices: None Consultations: None Discharge Condition: Improved CODE STATUS: Full Diet Recommendation: Heart Healthy carb modified  Diet Order            Diet Carb Modified        Diet Carb Modified Fluid consistency: Thin; Room service appropriate? Yes  Diet effective now               Chief Complaint  Patient presents with  . Hypoglycemia     Brief history of present illness from the day of admission and additional interim summary    Patient is a 44 year old man with DM2, HTN, obesity, CKD 3 and prostate cancer who was admitted November 09, 2019 with an episode of syncope versus presyncope.  It was thought this was very likely secondary to blood sugar of 51, however in the ED patient was noted to be in worsened kidney failure with a creatinine of 4 up from 2.5.  Of note patient's COVID-19 PCR came back POSITIVE.  He was however asymptomatic with negative chest x-ray and no hypoxia.  Since he was asymptomatic he was not treated with either steroids or remdesivir.  Patient was admitted for syncope work-up and acute kidney injury.                                                                 Hospital Course   On further discussion, it was noted that patient had taken ibuprofen for 3  days a couple of weeks prior to admission when he had his wisdom teeth pulled.  This was discussed with Dr. Zollie Scale his nephrologist who noted this could definitely be a cause of his worsened renal function.  His worsened kidney function likely led to decreased clearance of his glimepiride which led to persistent hypoglycemia.  Patient was treated with a D5 drip and cessation of glimepiride.  Patient's blood sugars were initially low on the D5 drip however he was tapered off and on day of admission his sugars were 130s to 150s off D5 and off his glimepiride.  Syncope work-up was completed with carotid Dopplers which were negative, normal echocardiogram with an EF of 60 to 65% and no left ear and a perfusion scan which showed no perfusion deficits.  Patient's kidney function continue to improve with  mild and gentle hydration.  On day of discharge it had improved to 3.38.  Discussed with Dr. Zollie Scale who felt his renal function would probably continue to improve at home off of any NSAIDs.  Patient is now discharged home off his glimepiride to follow-up with PCP to discuss best management of his diabetes with worsened kidney function.  He is to follow-up with Dr. Zollie Scale in 1 to 2 weeks after discharge.  Syncope I do suspect that patient's syncope/presyncope was secondary to hypoglycemia. Work-up including carotid Dopplers is negative Echocardiogram is normal with a EF of 60 to 65% and no lAS. Perfusion scan is also without any evidence of perfusion defects.  Elevated D-dimer As noted above perfusion scan is without any perfusion deficits. Most likely elevated secondary to COVID-19  Hypoglycemia/DM 2 Suspect patient's hypoglycemia is secondary to his acute renal failure and prolonged action of glimepiride. Plaisance blood sugar have remained 1 30-1 50s off D5 drip today.  AKI Discussed with Dr. Zollie Scale who is patient's outpatient nephrologist, its very possible that ibuprofen he took 2 weeks ago is  what caused his acute kidney injury.  Creatinine is improving somewhat with hydration.  We will continue to follow.  Dr. Zollie Scale notes it will be safe to discharge patient once his creatinine is in the mid 3 range.  Hypokalemia Normalized after repletion.  HTN Well controlled on present meds, continue to hold all losartan  COVID-19 Asymptomatic without hypoxia. Not on steroids or remdesivir as he is asymptomatic.  Discharge diagnosis     Principal Problem:   Postural dizziness with presyncope Active Problems:   Essential (primary) hypertension   Morbid obesity, unspecified obesity type (HCC)   Type II diabetes mellitus with complication (HCC)   Prostate cancer (HCC)   AKI (acute kidney injury) (HCC)   CKD (chronic kidney disease) stage 3, GFR 30-59 ml/min   Hypoglycemia    Discharge instructions    Discharge Instructions    Call MD for:  persistant dizziness or light-headedness   Complete by: As directed    Diet Carb Modified   Complete by: As directed    Discharge instructions   Complete by: As directed    1.  Stop taking your diabetes medication, Amaryl. 2.  Make sure you stay on a low-carb diet. 3.  Contact your PCP on Monday to see if they want to start any other medication for your diabetes or whether they just want you to check your fingersticks and report back to them.  You will need to see them in 1 to 2 weeks to discuss how to manage your diabetes with your worsened kidney function. 4.  You need to make an appointment to see Dr. Holley Raring in 1 week or at his next available appointment.  He is aware of what has happened to you in the hospital because I have been speaking with him. 5.  Make sure you do not take any ibuprofen or medications called NSAIDs.      Discharge Medications   Allergies as of 11/11/2019      Reactions   Percocet [oxycodone-acetaminophen] Nausea And Vomiting      Medication List    STOP taking these medications   colchicine 0.6 MG  tablet   glimepiride 2 MG tablet Commonly known as: AMARYL     TAKE these medications   amLODipine 10 MG tablet Commonly known as: NORVASC TAKE 1 TABLET BY MOUTH EVERY DAY   atorvastatin 10 MG tablet Commonly known as: LIPITOR TAKE 1  TABLET BY MOUTH EVERY DAY WITH BREAKFAST What changed: See the new instructions.   calcitRIOL 0.25 MCG capsule Commonly known as: ROCALTROL Take 0.25 mcg by mouth daily.   carvedilol 6.25 MG tablet Commonly known as: COREG Take 6.25 mg by mouth 2 (two) times daily with a meal.   cloNIDine 0.2 mg/24hr patch Commonly known as: CATAPRES - Dosed in mg/24 hr Place 1 patch (0.2 mg total) onto the skin once a week.   hydrALAZINE 100 MG tablet Commonly known as: APRESOLINE Take 100 mg by mouth 3 (three) times daily.   Lokelma 10 g Pack packet Generic drug: sodium zirconium cyclosilicate Take 10 g by mouth daily. MIX CONTENTS OF 1 PACKET IN THREE TABLESPOONSFUL OF WATER AND DRINK ONCE DAILY (TAKES EARLY IN THE MORNING)   olmesartan 40 MG tablet Commonly known as: BENICAR TAKE 1 TABLET(40 MG) BY MOUTH DAILY What changed: See the new instructions.   traMADol 50 MG tablet Commonly known as: ULTRAM Take 1 tablet (50 mg total) by mouth every 8 (eight) hours as needed.       Follow-up Information    Holley Raring, [place], MD. Schedule an appointment as soon as possible for a visit in 1 week(s).   Specialty: Nephrology Contact information: [place] [address] [phone]        Glean Hess, MD. Call in 2 day(s).   Specialty: Internal Medicine Contact information: [address] [place] [address] [phone]           Major procedures and Radiology Reports - PLEASE review detailed and final reports thoroughly  -        NM Pulmonary Perfusion  Result Date: 11/10/2019 CLINICAL DATA:  Elevated D-dimer and syncope.  COVID-19 positive EXAM: NUCLEAR MEDICINE PERFUSION LUNG SCAN TECHNIQUE:  Perfusion images were obtained in multiple projections after intravenous injection of radiopharmaceutical. Ventilation scans intentionally deferred if perfusion scan and chest x-ray adequate for interpretation during COVID 19 epidemic. Views: Anterior, posterior, left lateral, right lateral, RPO, LPO, RAO, LAO RADIOPHARMACEUTICALS:  3.96 mCi Tc-42m MAA IV COMPARISON:  Chest radiograph November 10, 2019 FINDINGS: Radiotracer uptake is homogeneous and symmetric bilaterally. No appreciable perfusion defects evident. IMPRESSION: No appreciable perfusion defects. Very low probability of pulmonary embolus. Electronically Signed   By: Lowella Grip III M.D.   On: 11/10/2019 13:22   US Carotid Bilateral  Result Date: 11/09/2019 CLINICAL DATA:  Syncope, collapse.  Hypertension, diabetes. EXAM: BILATERAL CAROTID DUPLEX ULTRASOUND TECHNIQUE: Pearline Cables scale imaging, color Doppler and duplex ultrasound were performed of bilateral carotid and vertebral arteries in the neck. COMPARISON:  None available FINDINGS: Criteria: Quantification of carotid stenosis is based on velocity parameters that correlate the residual internal carotid diameter with NASCET-based stenosis levels, using the diameter of the distal internal carotid lumen as the denominator for stenosis measurement. The following velocity measurements were obtained: RIGHT ICA: 109/31 cm/sec CCA: 397/67 cm/sec SYSTOLIC ICA/CCA RATIO:  0.8 ECA: 151 cm/sec LEFT ICA: 117/28 cm/sec CCA: 341/93 cm/sec SYSTOLIC ICA/CCA RATIO:  0.8 ECA: 131 cm/sec RIGHT CAROTID ARTERY: Minimal plaque in the proximal ICA just beyond its origin without stenosis. Normal waveforms and color Doppler signal. RIGHT VERTEBRAL ARTERY:  Normal flow direction and waveform. LEFT CAROTID ARTERY: Mild eccentric plaque in the bulb and proximal ICA. No high-grade stenosis. Normal waveforms and color Doppler signal. Mild tortuosity. LEFT VERTEBRAL ARTERY:  Normal flow direction and waveform. IMPRESSION: 1.  Bilateral carotid plaque resulting in less than 50% diameter ICA stenosis.  2. Antegrade bilateral vertebral arterial flow. Electronically Signed   By: Lucrezia Europe M.D.   On: 11/09/2019 10:04   US Venous Img Lower Bilateral (DVT)  Result Date: 11/10/2019 CLINICAL DATA:  44 year old male with syncope, lower extremity pain and swelling EXAM: BILATERAL LOWER EXTREMITY VENOUS DOPPLER ULTRASOUND TECHNIQUE: Gray-scale sonography with graded compression, as well as color Doppler and duplex ultrasound were performed to evaluate the lower extremity deep venous systems from the level of the common femoral vein and including the common femoral, femoral, profunda femoral, popliteal and calf veins including the posterior tibial, peroneal and gastrocnemius veins when visible. The superficial great saphenous vein was also interrogated. Spectral Doppler was utilized to evaluate flow at rest and with distal augmentation maneuvers in the common femoral, femoral and popliteal veins. COMPARISON:  None. FINDINGS: RIGHT LOWER EXTREMITY Common Femoral Vein: No evidence of thrombus. Normal compressibility, respiratory phasicity and response to augmentation. Saphenofemoral Junction: No evidence of thrombus. Normal compressibility and flow on color Doppler imaging. Profunda Femoral Vein: No evidence of thrombus. Normal compressibility and flow on color Doppler imaging. Femoral Vein: No evidence of thrombus. Normal compressibility, respiratory phasicity and response to augmentation. Popliteal Vein: No evidence of thrombus. Normal compressibility, respiratory phasicity and response to augmentation. Calf Veins: No evidence of thrombus. Normal compressibility and flow on color Doppler imaging. Superficial Great Saphenous Vein: No evidence of thrombus. Normal compressibility. Venous Reflux:  None. Other Findings:  None. LEFT LOWER EXTREMITY Common Femoral Vein: No evidence of thrombus. Normal compressibility, respiratory phasicity and response  to augmentation. Saphenofemoral Junction: No evidence of thrombus. Normal compressibility and flow on color Doppler imaging. Profunda Femoral Vein: No evidence of thrombus. Normal compressibility and flow on color Doppler imaging. Femoral Vein: No evidence of thrombus. Normal compressibility, respiratory phasicity and response to augmentation. Popliteal Vein: No evidence of thrombus. Normal compressibility, respiratory phasicity and response to augmentation. Calf Veins: No evidence of thrombus. Normal compressibility and flow on color Doppler imaging. Superficial Great Saphenous Vein: No evidence of thrombus. Normal compressibility. Venous Reflux:  None. Other Findings:  None. IMPRESSION: No evidence of deep venous thrombosis in either lower extremity. Electronically Signed   By: Jacqulynn Cadet M.D.   On: 11/10/2019 10:06   DG Chest Port 1 View  Result Date: 11/10/2019 CLINICAL DATA:  History of VQ scan. Additional history provided by technologist: Patient admitted yesterday for hypoglycemia. History of diabetes mellitus, hypertension. EXAM: PORTABLE CHEST 1 VIEW COMPARISON:  No pertinent prior studies available for comparison. FINDINGS: Heart size within normal limits. No evidence of airspace consolidation within the lungs. No evidence of pleural effusion or pneumothorax. No acute bony abnormality is identified. Thoracic spondylosis. IMPRESSION: No evidence of acute cardiopulmonary abnormality. Electronically Signed   By: Kellie Simmering DO   On: 11/10/2019 08:08   DG Knee Complete 4 Views Left  Result Date: 10/13/2019 CLINICAL DATA:  Knee pain. EXAM: LEFT KNEE - COMPLETE 4+ VIEW COMPARISON:  No recent. FINDINGS: Tiny knee joint effusion cannot be excluded. Tiny sclerotic focus noted in the medial distal left femur, most likely small bone island. Corticated bony density noted along the anterior aspect of the patella. This may represent an old fracture fragment. No acute bony abnormality identified. No  evidence of acute fracture. No evidence of dislocation. IMPRESSION: 1. Tiny knee joint effusion. No acute bony abnormality otherwise noted. 2. Corticated bony density noted along the anterior aspect of the patella. This may represent an old fracture fragment. Electronically Signed   By: Marcello Moores  Register  On: 10/13/2019 10:03   ECHOCARDIOGRAM COMPLETE  Result Date: 11/09/2019    ECHOCARDIOGRAM REPORT   Patient Name:   Benjamin Stewart Date of Exam: 11/09/2019 Medical Rec #:  [id number]        Height:       75.0 in Accession #:    [id number]       Weight:       342.0 lb Date of Birth:  09-24-1975         BSA:          2.756 m Patient Age:    22 years         BP:           129/81 mmHg Patient Gender: M                HR:           82 bpm. Exam Location:  [facility] Procedure: 2D Echo, Color Doppler, Cardiac Doppler and Intracardiac            Opacification Agent Indications:     R55 Syncope  History:         Patient has no prior history of Echocardiogram examinations.                  CKD; Risk Factors:Hypertension and Diabetes.  Sonographer:     Charmayne Sheer RDCS (AE) Referring Phys:  [id number] Athena Masse Diagnosing Phys: Ida Rogue MD  Sonographer Comments: No subcostal window and suboptimal apical window. Image acquisition challenging due to patient body habitus. IMPRESSIONS  1. Left ventricular ejection fraction, by estimation, is 60 to 65%. The left ventricle has normal function. The left ventricle has no regional wall motion abnormalities. Left ventricular diastolic parameters were normal.  2. Right ventricular systolic function is normal. The right ventricular size is normal.  3. The mitral valve is normal in structure. No evidence of mitral valve regurgitation. No evidence of mitral stenosis. FINDINGS  Left Ventricle: Left ventricular ejection fraction, by estimation, is 60 to 65%. The left ventricle has normal function. The left ventricle has no regional wall motion abnormalities. Definity contrast agent was  given IV to delineate the left ventricular  endocardial borders. The left ventricular internal cavity size was normal in size. There is no left ventricular hypertrophy. Left ventricular diastolic parameters were normal. Right Ventricle: The right ventricular size is normal. No increase in right ventricular wall thickness. Right ventricular systolic function is normal. Left Atrium: Left atrial size was normal in size. Right Atrium: Right atrial size was normal in size. Pericardium: There is no evidence of pericardial effusion. Mitral Valve: The mitral valve is normal in structure. Normal mobility of the mitral valve leaflets. No evidence of mitral valve regurgitation. No evidence of mitral valve stenosis. MV peak gradient, 6.2 mmHg. The mean mitral valve gradient is 3.0 mmHg. Tricuspid Valve: The tricuspid valve is normal in structure. Tricuspid valve regurgitation is not demonstrated. No evidence of tricuspid stenosis. Aortic Valve: The aortic valve is normal in structure. Aortic valve regurgitation is not visualized. No aortic stenosis is present. Aortic valve mean gradient measures 8.0 mmHg. Aortic valve peak gradient measures 15.1 mmHg. Aortic valve area, by VTI measures 3.44 cm. Pulmonic Valve: The pulmonic valve was normal in structure. Pulmonic valve regurgitation is not visualized. No evidence of pulmonic stenosis. Aorta: The aortic root is normal in size and structure. Venous: The inferior vena cava is normal in size with greater than 50% respiratory variability, suggesting right atrial  pressure of 3 mmHg. IAS/Shunts: No atrial level shunt detected by color flow Doppler.  LEFT VENTRICLE PLAX 2D LVIDd:         5.56 cm  Diastology LVIDs:         3.08 cm  LV e' lateral:   11.60 cm/s LV PW:         1.15 cm  LV E/e' lateral: 8.6 LV IVS:        1.12 cm  LV e' medial:    11.30 cm/s LVOT diam:     2.40 cm  LV E/e' medial:  8.8 LV SV:         117 LV SV Index:   42 LVOT Area:     4.52 cm  LEFT ATRIUM              Index LA diam:        4.40 cm 1.60 cm/m LA Vol (A2C):   37.6 ml 13.64 ml/m LA Vol (A4C):   54.3 ml 19.70 ml/m LA Biplane Vol: 46.4 ml 16.83 ml/m  AORTIC VALVE                    PULMONIC VALVE AV Area (Vmax):    3.19 cm     PV Vmax:       1.49 m/s AV Area (Vmean):   3.06 cm     PV Vmean:      99.700 cm/s AV Area (VTI):     3.44 cm     PV VTI:        0.286 m AV Vmax:           194.00 cm/s  PV Peak grad:  8.9 mmHg AV Vmean:          135.000 cm/s PV Mean grad:  5.0 mmHg AV VTI:            0.339 m AV Peak Grad:      15.1 mmHg AV Mean Grad:      8.0 mmHg LVOT Vmax:         137.00 cm/s LVOT Vmean:        91.400 cm/s LVOT VTI:          0.258 m LVOT/AV VTI ratio: 0.76  AORTA Ao Root diam: 3.50 cm MITRAL VALVE MV Area (PHT): 6.17 cm    SHUNTS MV Peak grad:  6.2 mmHg    Systemic VTI:  0.26 m MV Mean grad:  3.0 mmHg    Systemic Diam: 2.40 cm MV Vmax:       1.25 m/s MV Vmean:      80.8 cm/s MV Decel Time: 123 msec MV E velocity: 99.90 cm/s MV A velocity: 95.10 cm/s MV E/A ratio:  1.05 Ida Rogue MD Electronically signed by Ida Rogue MD Signature Date/Time: 11/09/2019/6:03:04 PM    Final     Micro Results    Recent Results (from the past 240 hour(s))  SARS CORONAVIRUS 2 (TAT 6-24 HRS) Nasopharyngeal Nasopharyngeal Swab     Status: Abnormal   Collection Time: 11/09/19 12:37 AM   Specimen: Nasopharyngeal Swab  Result Value Ref Range Status   SARS Coronavirus 2 POSITIVE (A) NEGATIVE Final    Comment: RESULT CALLED TO, READ BACK BY AND VERIFIED WITH: B. Alejo RN 14:40 11/09/19 ([email or web address]) (NOTE) SARS-CoV-2 target nucleic acids are DETECTED. The SARS-CoV-2 RNA is generally detectable in upper and lower respiratory specimens during the acute phase of infection. Positive results are indicative of the presence of SARS-CoV-2  RNA. Clinical correlation with patient history and other diagnostic information is  necessary to determine patient infection status. Positive results do not rule out bacterial  infection or co-infection with other viruses.  The expected result is Negative. Fact Sheet for Patients: SugarRoll.be Fact Sheet for Healthcare Providers: https://www.woods-mathews.com/ This test is not yet approved or cleared by the [place] FDA and  has been authorized for detection and/or diagnosis of SARS-CoV-2 by FDA under an Emergency Use Authorization (EUA). This EUA will remain  in effect (meaning this test can be used) for the  duration of the COVID-19 declaration under Section 564(b)(1) of the Act, 21 U.S.C. section 360bbb-3(b)(1), unless the authorization is terminated or revoked sooner. Performed at [facility], [place] [address], [place], [place] [address]     Today   Subjective    Benjamin Stewart today feels well.  No further dizziness syncope or presyncope.  No shortness of breath or cough.  Patient is eager to go home.  Objective   Blood pressure (!) 161/61, pulse 90, temperature 98.7 F (37.1 C), temperature source Oral, resp. rate 19, height 6' (1.829 m), weight (!) 156.1 kg, SpO2 100 %.   Intake/Output Summary (Last 24 hours) at 11/11/2019 1742 Last data filed at 11/11/2019 0900 Gross per 24 hour  Intake --  Output 850 ml  Net -850 ml    Exam Awake Alert, Oriented x 3, No new F.N deficits, Normal affect [place].AT,PERRAL Supple Neck,No JVD, No cervical lymphadenopathy appriciated.  Symmetrical Chest wall movement, Good air movement bilaterally, CTAB RRR,No Gallops,Rubs or new Murmurs, No Parasternal Heave +ve B.Sounds, Abd Soft, Non tender, No organomegaly appriciated, No rebound -guarding or rigidity. No Cyanosis, Clubbing or edema, No new Rash or bruise   Data Review   CBC w Diff:  Lab Results  Component Value Date   WBC 6.6 11/09/2019   HGB 10.0 (L) 11/09/2019   HGB 11.6 (L) 08/16/2018   HCT 31.0 (L) 11/09/2019   HCT 35.6 (L) 08/16/2018   PLT 338 11/09/2019   PLT 323 08/16/2018   LYMPHOPCT 15  05/14/2015   LYMPHOPCT 20.1 02/10/2012   MONOPCT 11 05/14/2015   MONOPCT 8.6 02/10/2012   EOSPCT 3 05/14/2015   EOSPCT 4.9 02/10/2012   BASOPCT 2 05/14/2015   BASOPCT 1.1 02/10/2012    CMP:  Lab Results  Component Value Date   NA 143 11/11/2019   NA 141 08/21/2019   NA 143 02/10/2012   K 3.6 11/11/2019   K 3.8 02/10/2012   CL 112 (H) 11/11/2019   CL 105 02/10/2012   CO2 23 11/11/2019   CO2 30 02/10/2012   BUN 50 (H) 11/11/2019   BUN 45 (H) 08/21/2019   BUN 11 02/10/2012   CREATININE 3.38 (H) 11/11/2019   CREATININE 1.17 02/10/2012   PROT 7.1 11/09/2019   PROT 7.2 08/21/2019   ALBUMIN 2.7 (L) 11/09/2019   ALBUMIN 3.3 (L) 08/21/2019   BILITOT 0.6 11/09/2019   BILITOT 0.6 08/21/2019   ALKPHOS 37 (L) 11/09/2019   AST 32 11/09/2019   ALT 17 11/09/2019  .   Total Time in preparing paper work, data evaluation and todays exam - 35 minutes  Vashti Hey M.D on 11/11/2019 at 5:42 PM  [facility]   Office  [phone]

## 2019-11-11 NOTE — Progress Notes (Signed)
   11/11/19 1800  Clinical Encounter Type  Visited With Patient  Visit Type Initial  Referral From Nurse  Consult/Referral To Chaplain   Patient wanted information on advanced directive. Chaplain gave education.

## 2019-11-11 NOTE — Discharge Instructions (Signed)
Acute Kidney Injury, Adult  Acute kidney injury is a sudden worsening of kidney function. The kidneys are organs that have several jobs. They filter the blood to remove waste products and extra fluid. They also maintain a healthy balance of minerals and hormones in the body, which helps control blood pressure and keep bones strong. With this condition, your kidneys do not do their jobs as well as they should. This condition ranges from mild to severe. Over time it may develop into long-lasting (chronic) kidney disease. Early detection and treatment may prevent acute kidney injury from developing into a chronic condition. What are the causes? Common causes of this condition include:  A problem with blood flow to the kidneys. This may be caused by: ? Low blood pressure (hypotension) or shock. ? Blood loss. ? Heart and blood vessel (cardiovascular) disease. ? Severe burns. ? Liver disease.  Direct damage to the kidneys. This may be caused by: ? Certain medicines. ? A kidney infection. ? Poisoning. ? Being around or in contact with toxic substances. ? A surgical wound. ? A hard, direct hit to the kidney area.  A sudden blockage of urine flow. This may be caused by: ? Cancer. ? Kidney stones. ? An enlarged prostate in males. What are the signs or symptoms? Symptoms of this condition may not be obvious until the condition becomes severe. Symptoms of this condition can include:  Tiredness (lethargy), or difficulty staying awake.  Nausea or vomiting.  Swelling (edema) of the face, legs, ankles, or feet.  Problems with urination, such as: ? Abdominal pain, or pain along the side of your stomach (flank). ? Decreased urine production. ? Decrease in the force of urine flow.  Muscle twitches and cramps, especially in the legs.  Confusion or trouble concentrating.  Loss of appetite.  Fever. How is this diagnosed? This condition may be diagnosed with tests, including:  Blood  tests.  Urine tests.  Imaging tests.  A test in which a sample of tissue is removed from the kidneys to be examined under a microscope (kidney biopsy). How is this treated? Treatment for this condition depends on the cause and how severe the condition is. In mild cases, treatment may not be needed. The kidneys may heal on their own. In more severe cases, treatment will involve:  Treating the cause of the kidney injury. This may involve changing any medicines you are taking or adjusting your dosage.  Fluids. You may need specialized IV fluids to balance your body's needs.  Having a catheter placed to drain urine and prevent blockages.  Preventing problems from occurring. This may mean avoiding certain medicines or procedures that can cause further injury to the kidneys. In some cases treatment may also require:  A procedure to remove toxic wastes from the body (dialysis or continuous renal replacement therapy - CRRT).  Surgery. This may be done to repair a torn kidney, or to remove the blockage from the urinary system. Follow these instructions at home: Medicines  Take over-the-counter and prescription medicines only as told by your health care provider.  Do not take any new medicines without your health care provider's approval. Many medicines can worsen your kidney damage.  Do not take any vitamin and mineral supplements without your health care provider's approval. Many nutritional supplements can worsen your kidney damage. Lifestyle  If your health care provider prescribed changes to your diet, follow them. You may need to decrease the amount of protein you eat.  Achieve and maintain a healthy  weight. If you need help with this, ask your health care provider.  Start or continue an exercise plan. Try to exercise at least 30 minutes a day, 5 days a week.  Do not use any tobacco products, such as cigarettes, chewing tobacco, and e-cigarettes. If you need help quitting, ask your  health care provider. General instructions  Keep track of your blood pressure. Report changes in your blood pressure as told by your health care provider.  Stay up to date with immunizations. Ask your health care provider which immunizations you need.  Keep all follow-up visits as told by your health care provider. This is important. Where to find more information  American Association of Kidney Patients: BombTimer.gl  National Kidney Foundation: www.kidney.[place]: https://mathis.com/  Life [facility]: ? www.lifeoptions.org ? www.kidneyschool.org Contact a health care provider if:  Your symptoms get worse.  You develop new symptoms. Get help right away if:  You develop symptoms of worsening kidney disease, which include: ? Headaches. ? Abnormally dark or light skin. ? Easy bruising. ? Frequent hiccups. ? Chest pain. ? Shortness of breath. ? End of menstruation in women. ? Seizures. ? Confusion or altered mental status. ? Abdominal or back pain. ? Itchiness.  You have a fever.  Your body is producing less urine.  You have pain or bleeding when you urinate. Summary  Acute kidney injury is a sudden worsening of kidney function.  Acute kidney injury can be caused by problems with blood flow to the kidneys, direct damage to the kidneys, and sudden blockage of urine flow.  Symptoms of this condition may not be obvious until it becomes severe. Symptoms may include edema, lethargy, confusion, nausea or vomiting, and problems passing urine.  This condition can usually be diagnosed with blood tests, urine tests, and imaging tests. Sometimes a kidney biopsy is done to diagnose this condition.  Treatment for this condition often involves treating the underlying cause. It is treated with fluids, medicines, dialysis, diet changes, or surgery. This information is not intended to replace advice given to you by your health care provider. Make  sure you discuss any questions you have with your health care provider. Document Revised: 07/09/2017 Document Reviewed: 07/17/2016 Elsevier Patient Education  [place].   Hypoglycemia Hypoglycemia is when the sugar (glucose) level in your blood is too low. Signs of low blood sugar may include:  Feeling: ? Hungry. ? Worried or nervous (anxious). ? Sweaty and clammy. ? Confused. ? Dizzy. ? Sleepy. ? Sick to your stomach (nauseous).  Having: ? A fast heartbeat. ? A headache. ? A change in your vision. ? Tingling or no feeling (numbness) around your mouth, lips, or tongue. ? Jerky movements that you cannot control (seizure).  Having trouble with: ? Moving (coordination). ? Sleeping. ? Passing out (fainting). ? Getting upset easily (irritability). Low blood sugar can happen to people who have diabetes and people who do not have diabetes. Low blood sugar can happen quickly, and it can be an emergency. Treating low blood sugar Low blood sugar is often treated by eating or drinking something sugary right away, such as:  Fruit juice, 4-6 oz (120-150 mL).  Regular soda (not diet soda), 4-6 oz (120-150 mL).  Low-fat milk, 4 oz (120 mL).  Several pieces of hard candy.  Sugar or honey, 1 Tbsp (15 mL). Treating low blood sugar if you have diabetes If you can think clearly and swallow safely, follow the 15:15 rule:  Take 15  grams of a fast-acting carb (carbohydrate). Talk with your doctor about how much you should take.  Always keep a source of fast-acting carb with you, such as: ? Sugar tablets (glucose pills). Take 3-4 pills. ? 6-8 pieces of hard candy. ? 4-6 oz (120-150 mL) of fruit juice. ? 4-6 oz (120-150 mL) of regular (not diet) soda. ? 1 Tbsp (15 mL) honey or sugar.  Check your blood sugar 15 minutes after you take the carb.  If your blood sugar is still at or below 70 mg/dL (3.9 mmol/L), take 15 grams of a carb again.  If your blood sugar does not go  above 70 mg/dL (3.9 mmol/L) after 3 tries, get help right away.  After your blood sugar goes back to normal, eat a meal or a snack within 1 hour.  Treating very low blood sugar If your blood sugar is at or below 54 mg/dL (3 mmol/L), you have very low blood sugar (severe hypoglycemia). This may also cause:  Passing out.  Jerky movements you cannot control (seizure).  Losing consciousness (coma). This is an emergency. Do not wait to see if the symptoms will go away. Get medical help right away. Call your local emergency services (911 in the [place] Do not drive yourself to the hospital. If you have very low blood sugar and you cannot eat or drink, you may need a glucagon shot (injection). A family member or friend should learn how to check your blood sugar and how to give you a glucagon shot. Ask your doctor if you need to have a glucagon shot kit at home. Follow these instructions at home: General instructions  Take over-the-counter and prescription medicines only as told by your doctor.  Stay aware of your blood sugar as told by your doctor.  Limit alcohol intake to no more than 1 drink a day for nonpregnant women and 2 drinks a day for men. One drink equals 12 oz of beer (355 mL), 5 oz of wine (148 mL), or 1 oz of hard liquor (44 mL).  Keep all follow-up visits as told by your doctor. This is important. If you have diabetes:   Follow your diabetes care plan as told by your doctor. Make sure you: ? Know the signs of low blood sugar. ? Take your medicines as told. ? Follow your exercise and meal plan. ? Eat on time. Do not skip meals. ? Check your blood sugar as often as told by your doctor. Always check it before and after exercise. ? Follow your sick day plan when you cannot eat or drink normally. Make this plan ahead of time with your doctor.  Share your diabetes care plan with: ? Your work or school. ? People you live with.  Check your pee (urine) for ketones: ? When you  are sick. ? As told by your doctor.  Carry a card or wear jewelry that says you have diabetes. Contact a doctor if:  You have trouble keeping your blood sugar in your target range.  You have low blood sugar often. Get help right away if:  You still have symptoms after you eat or drink something sugary.  Your blood sugar is at or below 54 mg/dL (3 mmol/L).  You have jerky movements that you cannot control.  You pass out. These symptoms may be an emergency. Do not wait to see if the symptoms will go away. Get medical help right away. Call your local emergency services (911 in the [place] Do not drive  yourself to the hospital. Summary  Hypoglycemia happens when the level of sugar (glucose) in your blood is too low.  Low blood sugar can happen to people who have diabetes and people who do not have diabetes. Low blood sugar can happen quickly, and it can be an emergency.  Make sure you know the signs of low blood sugar and know how to treat it.  Always keep a source of sugar (fast-acting carb) with you to treat low blood sugar. This information is not intended to replace advice given to you by your health care provider. Make sure you discuss any questions you have with your health care provider. Document Revised: 11/17/2018 Document Reviewed: 08/30/2015 Elsevier Patient Education  2020 [employer].

## 2019-11-14 ENCOUNTER — Encounter: Payer: Self-pay | Admitting: Internal Medicine

## 2019-11-20 ENCOUNTER — Other Ambulatory Visit: Payer: Self-pay

## 2019-11-20 ENCOUNTER — Encounter: Payer: Self-pay | Admitting: Internal Medicine

## 2019-11-20 MED ORDER — CALCITRIOL 0.25 MCG PO CAPS: 0.2500 ug | ORAL_CAPSULE | Freq: Every day | ORAL | 1 refills | Status: DC

## 2019-11-27 ENCOUNTER — Ambulatory Visit (INDEPENDENT_AMBULATORY_CARE_PROVIDER_SITE_OTHER): Payer: 59 | Admitting: Internal Medicine

## 2019-11-27 ENCOUNTER — Encounter: Payer: Self-pay | Admitting: Internal Medicine

## 2019-11-27 ENCOUNTER — Other Ambulatory Visit: Payer: Self-pay

## 2019-11-27 VITALS — BP 134/94 | HR 89 | Temp 98.0°F | Ht 72.0 in | Wt 337.0 lb

## 2019-11-27 DIAGNOSIS — N1832 Chronic kidney disease, stage 3b: Secondary | ICD-10-CM | POA: Diagnosis not present

## 2019-11-27 DIAGNOSIS — I10 Essential (primary) hypertension: Secondary | ICD-10-CM | POA: Diagnosis not present

## 2019-11-27 DIAGNOSIS — E118 Type 2 diabetes mellitus with unspecified complications: Secondary | ICD-10-CM | POA: Diagnosis not present

## 2019-11-27 MED ORDER — GLIMEPIRIDE 2 MG PO TABS: 2.0000 mg | ORAL_TABLET | Freq: Every day | ORAL | 0 refills | Status: DC

## 2019-11-27 NOTE — Progress Notes (Signed)
Date:  11/27/2019   Name:  Benjamin Stewart   DOB:  24-Sep-1975   MRN:  [id number]   Chief Complaint: Diabetes and Covid Follow Up. Admitted to [facility] 11/09/19 to 11/11/19 for hypoglycemia due to combination of not eating after wisdom teeth extraction, continuing to take Glimepiride and combining it with Advil.  Diabetes He presents for his follow-up diabetic visit. He has type 2 diabetes mellitus. Pertinent negatives for hypoglycemia include no dizziness, headaches or nervousness/anxiousness. Pertinent negatives for diabetes include no chest pain and no fatigue. Current diabetic treatment includes diet. He is following a generally healthy diet. He monitors blood glucose at home 1-2 x per day. His breakfast blood glucose is taken between 6-7 am. His breakfast blood glucose range is generally >200 mg/dl. (Today but earlier 140-150) An ACE inhibitor/angiotensin II receptor blocker is being taken.  Hypertension This is a chronic problem. The problem is controlled. Pertinent negatives include no chest pain, headaches, palpitations or shortness of breath.  Renal insuff - slightly worse during hospital stay.  Will need recheck today.  Had virtual visit last week with Nephrology but no labs were done.  Immunization History  Administered Date(s) Administered  . Influenza,inj,Quad PF,6+ Mos 05/22/2014, 08/18/2016, 08/19/2017, 07/25/2018, 05/17/2019  . Influenza-Unspecified 06/14/2015  . PFIZER SARS-COV-2 Vaccination 11/02/2019  . Pneumococcal Polysaccharide-23 07/25/2018  . Pneumococcal-Unspecified 06/14/2015  . Tdap 06/14/2015    Lab Results  Component Value Date   CREATININE 3.38 (H) 11/11/2019   BUN 50 (H) 11/11/2019   NA 143 11/11/2019   K 3.6 11/11/2019   CL 112 (H) 11/11/2019   CO2 23 11/11/2019   Lab Results  Component Value Date   CHOL 101 08/21/2019   HDL 25 (L) 08/21/2019   LDLCALC 59 08/21/2019   TRIG 86 08/21/2019   CHOLHDL 4.0 08/21/2019   Lab Results  Component Value Date   TSH 1.173 11/09/2019   Lab Results  Component Value Date   HGBA1C 5.1 11/09/2019   Lab Results  Component Value Date   WBC 6.6 11/09/2019   HGB 10.0 (L) 11/09/2019   HCT 31.0 (L) 11/09/2019   MCV 89.3 11/09/2019   PLT 338 11/09/2019   Lab Results  Component Value Date   ALT 17 11/09/2019   AST 32 11/09/2019   ALKPHOS 37 (L) 11/09/2019   BILITOT 0.6 11/09/2019     Review of Systems  Constitutional: Positive for unexpected weight change (has lost 30 lbs with diet since January). Negative for chills, fatigue and fever.  Respiratory: Negative for chest tightness, shortness of breath and wheezing.   Cardiovascular: Negative for chest pain and palpitations.  Musculoskeletal: Positive for arthralgias and myalgias.  Neurological: Negative for dizziness, light-headedness and headaches.  Psychiatric/Behavioral: Negative for dysphoric mood and sleep disturbance. The patient is not nervous/anxious.     Patient Active Problem List   Diagnosis Date Noted  . AKI (acute kidney injury) ([place]) 11/09/2019  . CKD (chronic kidney disease) stage 3, GFR 30-59 ml/min 11/09/2019  . Postural dizziness with presyncope 11/09/2019  . Hypoglycemia 11/09/2019  . Secondary hyperparathyroidism of renal origin ([place]) 04/24/2019  . Hyperkalemia 04/24/2019  . Localized edema 01/19/2019  . Chronic gout of multiple sites 08/16/2018  . Proteinuria due to type 2 diabetes mellitus ([place]) 08/22/2017  . Prostate cancer ([place]) 08/23/2015  . Type II diabetes mellitus with complication ([place]) 94/85/4627  . Hyperlipidemia associated with type 2 diabetes mellitus ([place]) 07/16/2015  . Essential (primary) hypertension 12/04/2014  . Morbid obesity, unspecified obesity  type ([place]) 12/04/2014    Allergies  Allergen Reactions  . Percocet [Oxycodone-Acetaminophen] Nausea And Vomiting    Past Surgical History:  Procedure Laterality Date  . PARTIAL COLECTOMY  2005  . RADIOACTIVE SEED IMPLANT N/A 12/06/2017   Procedure:  RADIOACTIVE SEED IMPLANT/BRACHYTHERAPY IMPLANT;  Surgeon: Hollice Espy, MD;  Location: [facility] ORS;  Service: Urology;  Laterality: N/A;    Social History   Tobacco Use  . Smoking status: Never Smoker  . Smokeless tobacco: Never Used  Substance Use Topics  . Alcohol use: No    Alcohol/week: 0.0 standard drinks  . Drug use: No     Medication list has been reviewed and updated.  Current Meds  Medication Sig  . amLODipine (NORVASC) 10 MG tablet TAKE 1 TABLET BY MOUTH EVERY DAY  . atorvastatin (LIPITOR) 10 MG tablet TAKE 1 TABLET BY MOUTH EVERY DAY WITH BREAKFAST (Patient taking differently: Take 10 mg by mouth daily. )  . calcitRIOL (ROCALTROL) 0.25 MCG capsule Take 1 capsule (0.25 mcg total) by mouth daily.  . carvedilol (COREG) 6.25 MG tablet Take 6.25 mg by mouth 2 (two) times daily with a meal.   . cloNIDine (CATAPRES - DOSED IN MG/24 HR) 0.2 mg/24hr patch Place 1 patch (0.2 mg total) onto the skin once a week.  . hydrALAZINE (APRESOLINE) 100 MG tablet Take 100 mg by mouth 3 (three) times daily.  Marland Kitchen LOKELMA 10 g PACK packet Take 10 g by mouth daily. MIX CONTENTS OF 1 PACKET IN THREE TABLESPOONSFUL OF WATER AND DRINK ONCE DAILY (TAKES EARLY IN THE MORNING)  . olmesartan (BENICAR) 40 MG tablet TAKE 1 TABLET(40 MG) BY MOUTH DAILY (Patient taking differently: Take 40 mg by mouth daily. )  . traMADol (ULTRAM) 50 MG tablet Take 1 tablet (50 mg total) by mouth every 8 (eight) hours as needed.    PHQ 2/9 Scores 11/27/2019 08/21/2019 01/19/2019 12/27/2018  PHQ - 2 Score 0 0 0 0  PHQ- 9 Score 2 0 - -    BP Readings from Last 3 Encounters:  11/27/19 (!) 134/94  11/11/19 (!) 161/61  10/27/19 (!) (P) 160/99    Physical Exam Vitals and nursing note reviewed.  Constitutional:      General: He is not in acute distress.    Appearance: He is well-developed.  HENT:     Head: Normocephalic and atraumatic.  Cardiovascular:     Rate and Rhythm: Normal rate and regular rhythm.     Pulses:  Normal pulses.     Heart sounds: No murmur.  Pulmonary:     Effort: Pulmonary effort is normal. No respiratory distress.     Breath sounds: No wheezing or rhonchi.  Musculoskeletal:     Cervical back: Normal range of motion.     Right lower leg: No edema.     Left lower leg: No edema.  Skin:    General: Skin is warm and dry.     Capillary Refill: Capillary refill takes less than 2 seconds.     Findings: No rash.  Neurological:     General: No focal deficit present.     Mental Status: He is alert and oriented to person, place, and time.  Psychiatric:        Behavior: Behavior normal.        Thought Content: Thought content normal.     Wt Readings from Last 3 Encounters:  11/27/19 (!) 337 lb (152.9 kg)  11/11/19 (!) 344 lb 2.2 oz (156.1 kg)  10/27/19 (!) (  P) 337 lb (152.9 kg)    BP (!) 134/94   Pulse 89   Temp 98 F (36.7 C) (Oral)   Ht 6' (1.829 m)   Wt (!) 337 lb (152.9 kg)   SpO2 98%   BMI 45.71 kg/m   Assessment and Plan: 1. Essential (primary) hypertension Clinically stable exam with well controlled BP on multiple agents. Tolerating medications without side effects at this time. Pt to continue current regimen and low sodium diet; benefits of regular exercise as able discussed.  2. Type II diabetes mellitus with complication (HCC) Stopped glimepiride briefly but blood sugars are very high again - 290 this am. Continue efforts at diet and weight loss Resume glimepiride but hold it if not eating - Hemoglobin A1c - glimepiride (AMARYL) 2 MG tablet; Take 1 tablet (2 mg total) by mouth daily.  Dispense: 90 tablet; Refill: 0  3. Stage 3b chronic kidney disease Followed by Nephrology - had been worse during hospitalization Will recheck today - Basic metabolic panel  Patient may proceed to get his second Covid vaccine now.  Partially dictated using Editor, commissioning. Any errors are unintentional.  Halina Maidens, MD [facility]  11/27/2019

## 2019-11-27 NOTE — Patient Instructions (Signed)
Resume the Glipizide daily.  If you don't eat, are sick,etc then do not take the glipizide.

## 2019-11-28 LAB — BASIC METABOLIC PANEL
BUN/Creatinine Ratio: 11 (ref 9–20)
BUN: 56 mg/dL — ABNORMAL HIGH (ref 6–24)
CO2: 22 mmol/L (ref 20–29)
Calcium: 9.2 mg/dL (ref 8.7–10.2)
Chloride: 106 mmol/L (ref 96–106)
Creatinine, Ser: 4.97 mg/dL — ABNORMAL HIGH (ref 0.76–1.27)
GFR calc Af Amer: 15 mL/min/{1.73_m2} — ABNORMAL LOW (ref >59)
GFR calc non Af Amer: 13 mL/min/{1.73_m2} — ABNORMAL LOW (ref >59)
Glucose: 111 mg/dL — ABNORMAL HIGH (ref 65–99)
Potassium: 3.9 mmol/L (ref 3.5–5.2)
Sodium: 142 mmol/L (ref 134–144)

## 2019-11-28 LAB — HEMOGLOBIN A1C
Est. average glucose Bld gHb Est-mCnc: 103 mg/dL
Hgb A1c MFr Bld: 5.2 % (ref 4.8–5.6)

## 2019-12-06 ENCOUNTER — Encounter: Payer: Self-pay | Admitting: Internal Medicine

## 2019-12-07 ENCOUNTER — Encounter: Payer: Self-pay | Admitting: Internal Medicine

## 2019-12-29 ENCOUNTER — Inpatient Hospital Stay
Admission: EM | Admit: 2019-12-29 | Discharge: 2020-01-01 | DRG: 638 | Disposition: A | Discharge status: Home / Self Care | Payer: 59 | Attending: Internal Medicine | Admitting: Internal Medicine

## 2019-12-29 ENCOUNTER — Other Ambulatory Visit: Payer: Self-pay

## 2019-12-29 ENCOUNTER — Emergency Department: Payer: 59

## 2019-12-29 DIAGNOSIS — D649 Anemia, unspecified: Secondary | ICD-10-CM | POA: Diagnosis not present

## 2019-12-29 DIAGNOSIS — Z87442 Personal history of urinary calculi: Secondary | ICD-10-CM

## 2019-12-29 DIAGNOSIS — Z6841 Body Mass Index (BMI) 40.0 and over, adult: Secondary | ICD-10-CM

## 2019-12-29 DIAGNOSIS — I129 Hypertensive chronic kidney disease with stage 1 through stage 4 chronic kidney disease, or unspecified chronic kidney disease: Secondary | ICD-10-CM | POA: Diagnosis present

## 2019-12-29 DIAGNOSIS — E162 Hypoglycemia, unspecified: Secondary | ICD-10-CM | POA: Diagnosis not present

## 2019-12-29 DIAGNOSIS — G4730 Sleep apnea, unspecified: Secondary | ICD-10-CM | POA: Diagnosis present

## 2019-12-29 DIAGNOSIS — D72829 Elevated white blood cell count, unspecified: Secondary | ICD-10-CM | POA: Diagnosis present

## 2019-12-29 DIAGNOSIS — E1122 Type 2 diabetes mellitus with diabetic chronic kidney disease: Secondary | ICD-10-CM | POA: Diagnosis present

## 2019-12-29 DIAGNOSIS — E872 Acidosis: Secondary | ICD-10-CM | POA: Diagnosis present

## 2019-12-29 DIAGNOSIS — Z833 Family history of diabetes mellitus: Secondary | ICD-10-CM

## 2019-12-29 DIAGNOSIS — Z8042 Family history of malignant neoplasm of prostate: Secondary | ICD-10-CM | POA: Diagnosis not present

## 2019-12-29 DIAGNOSIS — D638 Anemia in other chronic diseases classified elsewhere: Secondary | ICD-10-CM | POA: Diagnosis present

## 2019-12-29 DIAGNOSIS — U071 COVID-19: Secondary | ICD-10-CM

## 2019-12-29 DIAGNOSIS — N186 End stage renal disease: Secondary | ICD-10-CM | POA: Diagnosis present

## 2019-12-29 DIAGNOSIS — Z6835 Body mass index (BMI) 35.0-35.9, adult: Secondary | ICD-10-CM | POA: Diagnosis present

## 2019-12-29 DIAGNOSIS — Z8546 Personal history of malignant neoplasm of prostate: Secondary | ICD-10-CM

## 2019-12-29 DIAGNOSIS — Z79899 Other long term (current) drug therapy: Secondary | ICD-10-CM | POA: Diagnosis not present

## 2019-12-29 DIAGNOSIS — N184 Chronic kidney disease, stage 4 (severe): Secondary | ICD-10-CM | POA: Diagnosis present

## 2019-12-29 DIAGNOSIS — M1A9XX0 Chronic gout, unspecified, without tophus (tophi): Secondary | ICD-10-CM | POA: Diagnosis present

## 2019-12-29 DIAGNOSIS — Z8249 Family history of ischemic heart disease and other diseases of the circulatory system: Secondary | ICD-10-CM

## 2019-12-29 DIAGNOSIS — E118 Type 2 diabetes mellitus with unspecified complications: Secondary | ICD-10-CM

## 2019-12-29 DIAGNOSIS — R319 Hematuria, unspecified: Secondary | ICD-10-CM | POA: Diagnosis present

## 2019-12-29 DIAGNOSIS — I10 Essential (primary) hypertension: Secondary | ICD-10-CM | POA: Diagnosis present

## 2019-12-29 DIAGNOSIS — Z9049 Acquired absence of other specified parts of digestive tract: Secondary | ICD-10-CM | POA: Diagnosis not present

## 2019-12-29 DIAGNOSIS — N1832 Chronic kidney disease, stage 3b: Secondary | ICD-10-CM | POA: Diagnosis not present

## 2019-12-29 DIAGNOSIS — Z885 Allergy status to narcotic agent status: Secondary | ICD-10-CM

## 2019-12-29 DIAGNOSIS — R55 Syncope and collapse: Secondary | ICD-10-CM | POA: Diagnosis present

## 2019-12-29 DIAGNOSIS — Z79891 Long term (current) use of opiate analgesic: Secondary | ICD-10-CM

## 2019-12-29 DIAGNOSIS — N179 Acute kidney failure, unspecified: Secondary | ICD-10-CM | POA: Diagnosis present

## 2019-12-29 DIAGNOSIS — E785 Hyperlipidemia, unspecified: Secondary | ICD-10-CM | POA: Diagnosis present

## 2019-12-29 DIAGNOSIS — E11649 Type 2 diabetes mellitus with hypoglycemia without coma: Principal | ICD-10-CM | POA: Diagnosis present

## 2019-12-29 DIAGNOSIS — Z8616 Personal history of COVID-19: Secondary | ICD-10-CM

## 2019-12-29 DIAGNOSIS — Z7984 Long term (current) use of oral hypoglycemic drugs: Secondary | ICD-10-CM

## 2019-12-29 DIAGNOSIS — N2581 Secondary hyperparathyroidism of renal origin: Secondary | ICD-10-CM | POA: Diagnosis present

## 2019-12-29 HISTORY — DX: COVID-19: U07.1

## 2019-12-29 LAB — CBC WITH DIFFERENTIAL/PLATELET
Abs Immature Granulocytes: 0.06 10*3/uL (ref 0.00–0.07)
Basophils Absolute: 0 10*3/uL (ref 0.0–0.1)
Basophils Relative: 0 %
Eosinophils Absolute: 0 10*3/uL (ref 0.0–0.5)
Eosinophils Relative: 0 %
HCT: 30.4 % — ABNORMAL LOW (ref 39.0–52.0)
Hemoglobin: 10 g/dL — ABNORMAL LOW (ref 13.0–17.0)
Immature Granulocytes: 1 %
Lymphocytes Relative: 3 %
Lymphs Abs: 0.4 10*3/uL — ABNORMAL LOW (ref 0.7–4.0)
MCH: 29.5 pg (ref 26.0–34.0)
MCHC: 32.9 g/dL (ref 30.0–36.0)
MCV: 89.7 fL (ref 80.0–100.0)
Monocytes Absolute: 0.5 10*3/uL (ref 0.1–1.0)
Monocytes Relative: 4 %
Neutro Abs: 11.5 10*3/uL — ABNORMAL HIGH (ref 1.7–7.7)
Neutrophils Relative %: 92 %
Platelets: 403 10*3/uL — ABNORMAL HIGH (ref 150–400)
RBC: 3.39 MIL/uL — ABNORMAL LOW (ref 4.22–5.81)
RDW: 14.3 % (ref 11.5–15.5)
WBC: 12.5 10*3/uL — ABNORMAL HIGH (ref 4.0–10.5)
nRBC: 0 % (ref 0.0–0.2)

## 2019-12-29 LAB — BASIC METABOLIC PANEL
Anion gap: 14 (ref 5–15)
BUN: 89 mg/dL — ABNORMAL HIGH (ref 6–20)
CO2: 17 mmol/L — ABNORMAL LOW (ref 22–32)
Calcium: 8.7 mg/dL — ABNORMAL LOW (ref 8.9–10.3)
Chloride: 112 mmol/L — ABNORMAL HIGH (ref 98–111)
Creatinine, Ser: 6.98 mg/dL — ABNORMAL HIGH (ref 0.61–1.24)
GFR calc Af Amer: 10 mL/min — ABNORMAL LOW (ref >60)
GFR calc non Af Amer: 9 mL/min — ABNORMAL LOW (ref >60)
Glucose, Bld: 122 mg/dL — ABNORMAL HIGH (ref 70–99)
Potassium: 4.7 mmol/L (ref 3.5–5.1)
Sodium: 143 mmol/L (ref 135–145)

## 2019-12-29 LAB — GLUCOSE, CAPILLARY
Glucose-Capillary: 106 mg/dL — ABNORMAL HIGH (ref 70–99)
Glucose-Capillary: 118 mg/dL — ABNORMAL HIGH (ref 70–99)

## 2019-12-29 LAB — CK: Total CK: 566 U/L — ABNORMAL HIGH (ref 49–397)

## 2019-12-29 LAB — LACTIC ACID, PLASMA: Lactic Acid, Venous: 1.8 mmol/L (ref 0.5–1.9)

## 2019-12-29 MED ORDER — ENOXAPARIN SODIUM 40 MG/0.4ML ~~LOC~~ SOLN: 40.0000 mg | SUBCUTANEOUS | Status: DC

## 2019-12-29 MED ORDER — HEPARIN SODIUM (PORCINE) 5000 UNIT/ML IJ SOLN
5000.0000 [IU] | Freq: Three times a day (TID) | INTRAMUSCULAR | Status: DC
  Administered 2019-12-30 – 2020-01-01 (×6): 5000 [IU] via SUBCUTANEOUS
  Filled 2019-12-29 (×7): qty 1

## 2019-12-29 MED ORDER — LACTATED RINGERS IV BOLUS
1000.0000 mL | Freq: Once | INTRAVENOUS | Status: AC
  Administered 2019-12-29: 1000 mL via INTRAVENOUS

## 2019-12-29 MED ORDER — SODIUM ZIRCONIUM CYCLOSILICATE 10 G PO PACK
10.0000 g | PACK | Freq: Every day | ORAL | Status: DC
  Administered 2019-12-30 – 2019-12-31 (×2): 10 g via ORAL
  Filled 2019-12-29 (×3): qty 1

## 2019-12-29 MED ORDER — HYDRALAZINE HCL 50 MG PO TABS
100.0000 mg | ORAL_TABLET | Freq: Three times a day (TID) | ORAL | Status: DC
  Administered 2019-12-30 – 2020-01-01 (×8): 100 mg via ORAL
  Filled 2019-12-29 (×8): qty 2

## 2019-12-29 MED ORDER — LACTATED RINGERS IV SOLN: INTRAVENOUS | Status: DC

## 2019-12-29 MED ORDER — AMLODIPINE BESYLATE 10 MG PO TABS
10.0000 mg | ORAL_TABLET | Freq: Every day | ORAL | Status: DC
  Administered 2019-12-30 – 2020-01-01 (×3): 10 mg via ORAL
  Filled 2019-12-29 (×3): qty 1

## 2019-12-29 MED ORDER — ATORVASTATIN CALCIUM 10 MG PO TABS
10.0000 mg | ORAL_TABLET | Freq: Every day | ORAL | Status: DC
  Administered 2019-12-30 – 2020-01-01 (×3): 10 mg via ORAL
  Filled 2019-12-29 (×3): qty 1

## 2019-12-29 MED ORDER — CARVEDILOL 6.25 MG PO TABS
6.2500 mg | ORAL_TABLET | Freq: Two times a day (BID) | ORAL | Status: DC
  Administered 2019-12-30 – 2020-01-01 (×5): 6.25 mg via ORAL
  Filled 2019-12-29 (×5): qty 1

## 2019-12-29 MED ORDER — CALCITRIOL 0.25 MCG PO CAPS
0.2500 ug | ORAL_CAPSULE | Freq: Every day | ORAL | Status: DC
  Administered 2019-12-30 – 2020-01-01 (×3): 0.25 ug via ORAL
  Filled 2019-12-29 (×3): qty 1

## 2019-12-29 NOTE — ED Triage Notes (Addendum)
Pt from home via [facility].  Pt found unresponsive by brother with unknown downtime, last known well last night. EMS reports brother heard pt snoring and assumed pt was asleep. Pt unable to wake up EMS was called.   EMS reports CBG initial 32, pt given D10, juice and sandwich. CBG increased to 120 then decreased to 72. EMS VS: 179/92, 100 HR, 98 RA.   Pt A&O x4, CBG 102 at this time.

## 2019-12-29 NOTE — H&P (Signed)
History and Physical    [name] QPY:[id number] DOB: 1976/06/15 DOA: 12/29/2019  PCP: Glean Hess, MD  Patient coming from: Home  I have personally briefly reviewed patient's old medical records in [place]  Chief Complaint: Unresponsiveness  HPI: Benjamin Stewart is a 44 y.o. male with medical history significant for History of type 2 diabetes, hypertension, CKD stage IIIb, prostate cancer, morbid obesity, and hyperlipidemia who presents with an episode of unresponsiveness.  Patient lives with his brother and was found unresponsive today.  Last known well was last night.  Patient only recalls going to bed last night and then waking up to EMS.  His blood glucose was noted to be 32.  He reports that he was otherwise in his normal state of health.  Patient was previously hospitalized several weeks ago for hypoglycemia and had cessation of his glimepiride.  However he had follow-up with his PCP afterwards and his medication was resumed.  He reports having normal p.o. intake and ate dinner prior to going to bed.  He denies any dizziness or lightheadedness.  No chest pain or shortness of breath.  Patient also noted to have worsening creatinine of 6.98 from a baseline of likely 2.7.  He had acute renal failure during his last hospitalization that was thought due to ibuprofen that he was taking for dental pain.  However patient states that he has since discontinued all NSAIDs.  Reports that he has been staying hydrated.  Denies any changes to his urine output.  No nausea, vomiting or diarrhea.  Denies any alcohol use.  His last admission in April included work-up of a carotid Doppler that was negative and an echocardiogram that was normal with EF of 60 to 65%.  Patient was given D10, juice and sandwich by EMS and had improvement of his blood glucose up to 120.  In the ED, he was afebrile and mildly hypertensive with systolic up to 124P on room air.  CBC shows mild leukocytosis of  12.5, chronic anemia with hemoglobin stable at 10.    Review of Systems:  Constitutional: No Weight Change, No Fever ENT/Mouth: No sore throat, No Rhinorrhea Eyes: No Eye Pain, No Vision Changes Cardiovascular: No Chest Pain, no SOB Respiratory: No Cough, No Sputum  Gastrointestinal: No Nausea, No Vomiting, No Diarrhea, No Constipation, No Pain Genitourinary: no Urinary Incontinence Musculoskeletal: No Arthralgias, No Myalgias Skin: No Skin Lesions, No Pruritus, Neuro: no Weakness, No Numbness,  + Loss of Consciousness, +Syncope Psych: No Anxiety/Panic, No Depression, no decrease appetite Heme/Lymph: No Bruising, No Bleeding  Past Medical History:  Diagnosis Date  . Benign essential hematuria 12/04/2014   microscopic hematuria evaluation with CT and cysto - treated for 2 months with bactrim DS; no further f/u planned unless hematuria recurs.   . Controlled type 2 diabetes mellitus without complication, without long-term current use of insulin ([place]) 07/16/2015  . Essential (primary) hypertension 12/04/2014  . History of kidney stones   . prostate cancer   . Sleep apnea     Past Surgical History:  Procedure Laterality Date  . PARTIAL COLECTOMY  2005  . RADIOACTIVE SEED IMPLANT N/A 12/06/2017   Procedure: RADIOACTIVE SEED IMPLANT/BRACHYTHERAPY IMPLANT;  Surgeon: Hollice Espy, MD;  Location: [facility] ORS;  Service: Urology;  Laterality: N/A;     reports that he has never smoked. He has never used smokeless tobacco. He reports that he does not drink alcohol or use drugs.  Allergies  Allergen Reactions  . Percocet [Oxycodone-Acetaminophen] Nausea  And Vomiting    Family History  Problem Relation Age of Onset  . Diabetes Mother   . Heart disease Mother   . Hypertension Mother   . Glaucoma Mother   . Prostate cancer Father   . Diabetes Maternal Grandmother   . Diabetes Maternal Grandfather   . Prostate cancer Maternal Uncle      Prior to Admission medications   Medication  Sig Start Date End Date Taking? Authorizing Provider  amLODipine (NORVASC) 10 MG tablet TAKE 1 TABLET BY MOUTH EVERY DAY 01/23/19  Yes Glean Hess, MD  atorvastatin (LIPITOR) 10 MG tablet TAKE 1 TABLET BY MOUTH EVERY DAY WITH BREAKFAST Patient taking differently: Take 10 mg by mouth daily.  01/23/19  Yes Glean Hess, MD  calcitRIOL (ROCALTROL) 0.25 MCG capsule Take 1 capsule (0.25 mcg total) by mouth daily. 11/20/19  Yes Glean Hess, MD  carvedilol (COREG) 6.25 MG tablet Take 6.25 mg by mouth 2 (two) times daily with a meal.  03/30/17  Yes [provider]  cloNIDine (CATAPRES - DOSED IN MG/24 HR) 0.2 mg/24hr patch Place 1 patch (0.2 mg total) onto the skin once a week. 08/21/19  Yes Glean Hess, MD  glimepiride (AMARYL) 2 MG tablet Take 1 tablet (2 mg total) by mouth daily. 11/27/19  Yes Glean Hess, MD  hydrALAZINE (APRESOLINE) 100 MG tablet Take 100 mg by mouth 3 (three) times daily.   Yes [provider]  LOKELMA 10 g PACK packet Take 10 g by mouth daily. MIX CONTENTS OF 1 PACKET IN THREE TABLESPOONSFUL OF WATER AND DRINK ONCE DAILY (TAKES EARLY IN THE MORNING) 12/10/18  Yes [provider]  olmesartan (BENICAR) 40 MG tablet TAKE 1 TABLET(40 MG) BY MOUTH DAILY Patient taking differently: Take 40 mg by mouth daily.  02/14/19  Yes Glean Hess, MD  traMADol (ULTRAM) 50 MG tablet Take 1 tablet (50 mg total) by mouth every 8 (eight) hours as needed. 10/13/19  Yes Coral Spikes, DO    Physical Exam: Vitals:   12/29/19 2059 12/29/19 2106 12/29/19 2107  BP:  (!) 181/95   Pulse:  93   Resp:  11   Temp:  98 F (36.7 C)   TempSrc:  Oral   SpO2: 98% 100%   Weight:   (!) 147.4 kg  Height:   6' (1.829 m)    Constitutional: NAD, calm, comfortable, nontoxic fatigued appearing male lying flat in bed Vitals:   12/29/19 2059 12/29/19 2106 12/29/19 2107  BP:  (!) 181/95   Pulse:  93   Resp:  11   Temp:  98 F (36.7 C)   TempSrc:  Oral   SpO2:  98% 100%   Weight:   (!) 147.4 kg  Height:   6' (1.829 m)   Eyes: PERRL, erythematous conjunctivae bilaterally ENMT: Mucous membranes are moist.  Neck: normal, supple Respiratory: clear to auscultation bilaterally, no wheezing, no crackles. Normal respiratory effort. .  Cardiovascular: Regular rate and rhythm, no murmurs / rubs / gallops. No extremity edema. 2+ pedal pulses.   Abdomen: no tenderness, no masses palpated. . Bowel sounds positive.  Musculoskeletal: no clubbing / cyanosis. No joint deformity upper and lower extremities. Good ROM, no contractures. Normal muscle tone.  Skin: no rashes, lesions, ulcers. No induration Neurologic: CN 2-12 grossly intact. Sensation intact. Strength 5/5 in all 4.  Intact finger-nose. Psychiatric: Normal judgment and insight. Alert and oriented x 3. Normal mood.    Labs on Admission:  I have personally reviewed following labs and imaging studies  CBC: Recent Labs  Lab 12/29/19 2112  WBC 12.5*  NEUTROABS 11.5*  HGB 10.0*  HCT 30.4*  MCV 89.7  PLT 132*   Basic Metabolic Panel: Recent Labs  Lab 12/29/19 2112  NA 143  K 4.7  CL 112*  CO2 17*  GLUCOSE 122*  BUN 89*  CREATININE 6.98*  CALCIUM 8.7*   GFR: Estimated Creatinine Clearance: 19.7 mL/min (A) (by C-G formula based on SCr of 6.98 mg/dL (H)). Liver Function Tests: No results for input(s): AST, ALT, ALKPHOS, BILITOT, PROT, ALBUMIN in the last 168 hours. No results for input(s): LIPASE, AMYLASE in the last 168 hours. No results for input(s): AMMONIA in the last 168 hours. Coagulation Profile: No results for input(s): INR, PROTIME in the last 168 hours. Cardiac Enzymes: No results for input(s): CKTOTAL, CKMB, CKMBINDEX, TROPONINI in the last 168 hours. BNP (last 3 results) No results for input(s): PROBNP in the last 8760 hours. HbA1C: No results for input(s): HGBA1C in the last 72 hours. CBG: Recent Labs  Lab 12/29/19 2103 12/29/19 2130  GLUCAP 106* 118*   Lipid  Profile: No results for input(s): CHOL, HDL, LDLCALC, TRIG, CHOLHDL, LDLDIRECT in the last 72 hours. Thyroid Function Tests: No results for input(s): TSH, T4TOTAL, FREET4, T3FREE, THYROIDAB in the last 72 hours. Anemia Panel: No results for input(s): VITAMINB12, FOLATE, FERRITIN, TIBC, IRON, RETICCTPCT in the last 72 hours. Urine analysis:    Component Value Date/Time   COLORURINE STRAW (A) 11/09/2019 0034   APPEARANCEUR CLEAR (A) 11/09/2019 0034   APPEARANCEUR Hazy (A) 10/24/2015 0930   LABSPEC 1.006 11/09/2019 0034   LABSPEC 1.010 11/23/2012 0823   PHURINE 6.0 11/09/2019 0034   GLUCOSEU NEGATIVE 11/09/2019 0034   GLUCOSEU NEGATIVE 11/23/2012 0823   HGBUR MODERATE (A) 11/09/2019 0034   BILIRUBINUR NEGATIVE 11/09/2019 0034   BILIRUBINUR neg 08/21/2019 0851   BILIRUBINUR Negative 10/24/2015 0930   BILIRUBINUR NEGATIVE 11/23/2012 0823   KETONESUR NEGATIVE 11/09/2019 0034   PROTEINUR 100 (A) 11/09/2019 0034   UROBILINOGEN 0.2 08/21/2019 0851   NITRITE NEGATIVE 11/09/2019 0034   LEUKOCYTESUR NEGATIVE 11/09/2019 0034   LEUKOCYTESUR 1+ 11/23/2012 0823    Radiological Exams on Admission: DG Chest Portable 1 View  Result Date: 12/29/2019 CLINICAL DATA:  Found unresponsive, syncope EXAM: PORTABLE CHEST 1 VIEW COMPARISON:  11/10/2019 FINDINGS: 2 frontal views of the chest demonstrate a stable cardiac silhouette. No airspace disease, effusion, or pneumothorax. No acute bony abnormality. IMPRESSION: 1. No acute intrathoracic process. Electronically Signed   By: Randa Ngo M.D.   On: 12/29/2019 22:10      Assessment/Plan Syncope/unresponsiveness suspect that patient's unresponsiveness was secondary to hypoglycemia. He had recent work-up for similar symptoms in April including carotid Dopplers which was negative Echocardiogram normal with a EF of 60 to 65%.  Hypoglycemia/DM 2 Monitor CBG q2hrs for now. Continuous IV LR fluid. If worsening, will add D5.  Suspect patient's  hypoglycemia is secondary to his acute renal failure and prolonged action of glimepiride. Advised cessation of his glimepiride.  His last hemoglobin A1c was noted to be well controlled at 5.2.  Acute on chronic CKD stage IIIb Significantly worsening creatinine up to 6.98 from a likely baseline of 2.7.  Unclear etiology of his worsening renal function.  Could be that patient was found down for some time.  CK levels are pending. Will obtain FENA labs Obtain renal ultrasound Monitor urine output Will follow with continuous fluids Likely will need nephrology  consult in the AM   HTN Continue Coreg, hydralazine, and amlodipine. Hold olmesartan  Recent COVID-19 infection Positive on 4/1. Does not need repeat testing as he will remain positive for some time.  Asymptomatic without hypoxia. Not on steroids or remdesivir as he is asymptomatic.  Chronic anemia Stable   Morbid obesity BMI greater than 44  DVT prophylaxis:.Lovenox Code Status: Full Family Communication: Plan discussed with patient at bedside  disposition Plan: Home with at least 2 midnight stays  Consults called:  Admission status: inpatient  Status is: Inpatient  Remains inpatient appropriate because:Inpatient level of care appropriate due to severity of illness   Dispo: The patient is from: Home              Anticipated d/c is to: Home              Anticipated d/c date is: 2 days              Patient currently is not medically stable to d/c.          Orene Desanctis DO [facility]   If 7PM-7AM, please contact night-coverage [email or web address]   12/29/2019, 10:45 PM

## 2019-12-29 NOTE — ED Provider Notes (Signed)
[facility] Emergency Department Provider Note    None    (approximate)  I have reviewed the triage vital signs and the nursing notes.   HISTORY  Chief Complaint Hypoglycemia    HPI Benjamin Stewart is a 44 y.o. male post past medical history presents to the ER after being found unresponsive by family member.  Patient is known to be hypoglycemic to the 30s.  Last seen normal was yesterday.  Remembers feeling otherwise well.  Has had somewhat decreased p.o.  Denies any abdominal pain.  No vomiting.  No chest pain or shortness of breath.  Patient was given D50 by EMS with resolution of his confusion and altered mental status.  Does take glimepiride for hyperglycemia.    Past Medical History:  Diagnosis Date  . Benign essential hematuria 12/04/2014   microscopic hematuria evaluation with CT and cysto - treated for 2 months with bactrim DS; no further f/u planned unless hematuria recurs.   . Controlled type 2 diabetes mellitus without complication, without long-term current use of insulin ([place]) 07/16/2015  . Essential (primary) hypertension 12/04/2014  . History of kidney stones   . prostate cancer   . Sleep apnea    Family History  Problem Relation Age of Onset  . Diabetes Mother   . Heart disease Mother   . Hypertension Mother   . Glaucoma Mother   . Prostate cancer Father   . Diabetes Maternal Grandmother   . Diabetes Maternal Grandfather   . Prostate cancer Maternal Uncle    Past Surgical History:  Procedure Laterality Date  . PARTIAL COLECTOMY  2005  . RADIOACTIVE SEED IMPLANT N/A 12/06/2017   Procedure: RADIOACTIVE SEED IMPLANT/BRACHYTHERAPY IMPLANT;  Surgeon: Hollice Espy, MD;  Location: [facility] ORS;  Service: Urology;  Laterality: N/A;   Patient Active Problem List   Diagnosis Date Noted  . AKI (acute kidney injury) ([place]) 11/09/2019  . CKD (chronic kidney disease) stage 3, GFR 30-59 ml/min 11/09/2019  . Postural dizziness with presyncope  11/09/2019  . Hypoglycemia 11/09/2019  . Secondary hyperparathyroidism of renal origin ([place]) 04/24/2019  . Hyperkalemia 04/24/2019  . Localized edema 01/19/2019  . Chronic gout of multiple sites 08/16/2018  . Proteinuria due to type 2 diabetes mellitus ([place]) 08/22/2017  . Prostate cancer ([place]) 08/23/2015  . Type II diabetes mellitus with complication ([place]) 63/14/9702  . Hyperlipidemia associated with type 2 diabetes mellitus ([place]) 07/16/2015  . Essential (primary) hypertension 12/04/2014  . Morbid obesity, unspecified obesity type ([place]) 12/04/2014      Prior to Admission medications   Medication Sig Start Date End Date Taking? Authorizing Provider  amLODipine (NORVASC) 10 MG tablet TAKE 1 TABLET BY MOUTH EVERY DAY 01/23/19  Yes Benjamin Hess, MD  atorvastatin (LIPITOR) 10 MG tablet TAKE 1 TABLET BY MOUTH EVERY DAY WITH BREAKFAST Patient taking differently: Take 10 mg by mouth daily.  01/23/19  Yes Benjamin Hess, MD  calcitRIOL (ROCALTROL) 0.25 MCG capsule Take 1 capsule (0.25 mcg total) by mouth daily. 11/20/19  Yes Benjamin Hess, MD  carvedilol (COREG) 6.25 MG tablet Take 6.25 mg by mouth 2 (two) times daily with a meal.  03/30/17  Yes [provider]  cloNIDine (CATAPRES - DOSED IN MG/24 HR) 0.2 mg/24hr patch Place 1 patch (0.2 mg total) onto the skin once a week. 08/21/19  Yes Benjamin Hess, MD  glimepiride (AMARYL) 2 MG tablet Take 1 tablet (2 mg total) by mouth daily. 11/27/19  Yes Benjamin Hess,  MD  hydrALAZINE (APRESOLINE) 100 MG tablet Take 100 mg by mouth 3 (three) times daily.   Yes [provider]  LOKELMA 10 g PACK packet Take 10 g by mouth daily. MIX CONTENTS OF 1 PACKET IN THREE TABLESPOONSFUL OF WATER AND DRINK ONCE DAILY (TAKES EARLY IN THE MORNING) 12/10/18  Yes [provider]  olmesartan (BENICAR) 40 MG tablet TAKE 1 TABLET(40 MG) BY MOUTH DAILY Patient taking differently: Take 40 mg by mouth daily.  02/14/19  Yes Benjamin Hess,  MD  traMADol (ULTRAM) 50 MG tablet Take 1 tablet (50 mg total) by mouth every 8 (eight) hours as needed. 10/13/19  Yes Coral Spikes, DO    Allergies Percocet [oxycodone-acetaminophen]    Social History Social History   Tobacco Use  . Smoking status: Never Smoker  . Smokeless tobacco: Never Used  Substance Use Topics  . Alcohol use: No    Alcohol/week: 0.0 standard drinks  . Drug use: No    Review of Systems Patient denies headaches, rhinorrhea, blurry vision, numbness, shortness of breath, chest pain, edema, cough, abdominal pain, nausea, vomiting, diarrhea, dysuria, fevers, rashes or hallucinations unless otherwise stated above in HPI. ____________________________________________   PHYSICAL EXAM:  VITAL SIGNS: Vitals:   12/29/19 2215 12/29/19 2230  BP:  (!) 167/102  Pulse: 90 93  Resp: 14 18  Temp:    SpO2: 100% 100%    Constitutional: Alert and oriented.  Eyes: Conjunctivae are injected Head: Atraumatic. Nose: No congestion/rhinnorhea. Mouth/Throat: Mucous membranes are moist.   Neck: No stridor. Painless ROM.  Cardiovascular: Normal rate, regular rhythm. Grossly normal heart sounds.  Good peripheral circulation. Respiratory: Normal respiratory effort.  No retractions. Lungs CTAB. Gastrointestinal: Soft and nontender. No distention. No abdominal bruits. No CVA tenderness. Genitourinary:  Musculoskeletal: No lower extremity tenderness nor edema.  No joint effusions. Neurologic:  Normal speech and language. No gross focal neurologic deficits are appreciated. No facial droop Skin:  Skin is warm, dry and intact. No rash noted. Psychiatric: Mood and affect are normal. Speech and behavior are normal.  ____________________________________________   LABS (all labs ordered are listed, but only abnormal results are displayed)  Results for orders placed or performed during the hospital encounter of 12/29/19 (from the past 24 hour(s))  Glucose, capillary     Status:  Abnormal   Collection Time: 12/29/19  9:03 PM  Result Value Ref Range   Glucose-Capillary 106 (H) 70 - 99 mg/dL  CBC with Differential/Platelet     Status: Abnormal   Collection Time: 12/29/19  9:12 PM  Result Value Ref Range   WBC 12.5 (H) 4.0 - 10.5 K/uL   RBC 3.39 (L) 4.22 - 5.81 MIL/uL   Hemoglobin 10.0 (L) 13.0 - 17.0 g/dL   HCT 30.4 (L) 39.0 - 52.0 %   MCV 89.7 80.0 - 100.0 fL   MCH 29.5 26.0 - 34.0 pg   MCHC 32.9 30.0 - 36.0 g/dL   RDW 14.3 11.5 - 15.5 %   Platelets 403 (H) 150 - 400 K/uL   nRBC 0.0 0.0 - 0.2 %   Neutrophils Relative % 92 %   Neutro Abs 11.5 (H) 1.7 - 7.7 K/uL   Lymphocytes Relative 3 %   Lymphs Abs 0.4 (L) 0.7 - 4.0 K/uL   Monocytes Relative 4 %   Monocytes Absolute 0.5 0.1 - 1.0 K/uL   Eosinophils Relative 0 %   Eosinophils Absolute 0.0 0.0 - 0.5 K/uL   Basophils Relative 0 %   Basophils  Absolute 0.0 0.0 - 0.1 K/uL   Immature Granulocytes 1 %   Abs Immature Granulocytes 0.06 0.00 - 0.07 K/uL  Basic metabolic panel     Status: Abnormal   Collection Time: 12/29/19  9:12 PM  Result Value Ref Range   Sodium 143 135 - 145 mmol/L   Potassium 4.7 3.5 - 5.1 mmol/L   Chloride 112 (H) 98 - 111 mmol/L   CO2 17 (L) 22 - 32 mmol/L   Glucose, Bld 122 (H) 70 - 99 mg/dL   BUN 89 (H) 6 - 20 mg/dL   Creatinine, Ser 6.98 (H) 0.61 - 1.24 mg/dL   Calcium 8.7 (L) 8.9 - 10.3 mg/dL   GFR calc non Af Amer 9 (L) >60 mL/min   GFR calc Af Amer 10 (L) >60 mL/min   Anion gap 14 5 - 15  Glucose, capillary     Status: Abnormal   Collection Time: 12/29/19  9:30 PM  Result Value Ref Range   Glucose-Capillary 118 (H) 70 - 99 mg/dL   Comment 1 Notify RN    ____________________________________________  EKG My review and personal interpretation at Time: 21:07   Indication: unresponsive  Rate: 95  Rhythm: sinus Axis: normal Other: normal intervals,no stemi ____________________________________________  RADIOLOGY  I personally reviewed all radiographic images ordered to  evaluate for the above acute complaints and reviewed radiology reports and findings.  These findings were personally discussed with the patient.  Please see medical record for radiology report.  ____________________________________________   PROCEDURES  Procedure(s) performed:  .Critical Care Performed by: Merlyn Lot, MD Authorized by: Merlyn Lot, MD   Critical care provider statement:    Critical care time (minutes):  35   Critical care time was exclusive of:  Separately billable procedures and treating other patients   Critical care was necessary to treat or prevent imminent or life-threatening deterioration of the following conditions:  Renal failure   Critical care was time spent personally by me on the following activities:  Development of treatment plan with patient or surrogate, discussions with consultants, evaluation of patient's response to treatment, examination of patient, obtaining history from patient or surrogate, ordering and performing treatments and interventions, ordering and review of laboratory studies, ordering and review of radiographic studies, pulse oximetry, re-evaluation of patient's condition and review of old charts      Critical Care performed: yes ____________________________________________   INITIAL IMPRESSION / [place] / ED COURSE  Pertinent labs & imaging results that were available during my care of the patient were reviewed by me and considered in my medical decision making (see chart for details).   DDX: aki, hypoglycemia, sepsis, dysrhythmia, overdose  BRAXXTON STOUDT is a 44 y.o. who presents to the ED with presentation as described above.  Patient with clear hypoglycemic event uncertain as to how long this lasts.  It is neuro exam right now is intact may have had prolonged hypoglycemic event.  Blood work does show evidence of significant AKI and renal failure with metabolic acidosis.  Will give IV fluids.  The patient  will be placed on continuous pulse oximetry and telemetry for monitoring.  Laboratory evaluation will be sent to evaluate for the above complaints.      Work-up shows evidence of acute renal failure.  Will give IV fluids.  Repeat CBGs have been stable but will need continued monitoring as he reports that he has been taking glimepiride.  Have added on CK and lactate.  Have discussed with the patient  and available family all diagnostics and treatments performed thus far and all questions were answered to the best of my ability. The patient demonstrates understanding and agreement with plan.   The patient was evaluated in Emergency Department today for the symptoms described in the history of present illness. He/she was evaluated in the context of the global COVID-19 pandemic, which necessitated consideration that the patient might be at risk for infection with the SARS-CoV-2 virus that causes COVID-19. Institutional protocols and algorithms that pertain to the evaluation of patients at risk for COVID-19 are in a state of rapid change based on information released by regulatory bodies including the CDC and federal and state organizations. These policies and algorithms were followed during the patient's care in the ED.  As part of my medical decision making, I reviewed the following data within the [place] notes reviewed and incorporated, Labs reviewed, notes from prior ED visits and [place] Controlled Substance Database   ____________________________________________   FINAL CLINICAL IMPRESSION(S) / ED DIAGNOSES  Final diagnoses:  Acute renal failure, unspecified acute renal failure type ([place])  Hypoglycemia      NEW MEDICATIONS STARTED DURING THIS VISIT:  New Prescriptions   No medications on file     Note:  This document was prepared using Dragon voice recognition software and may include unintentional dictation errors.    Merlyn Lot, MD 12/29/19 2258

## 2019-12-30 ENCOUNTER — Inpatient Hospital Stay: Payer: 59

## 2019-12-30 ENCOUNTER — Encounter: Payer: Self-pay | Admitting: Family Medicine

## 2019-12-30 LAB — RETICULOCYTES
Immature Retic Fract: 10.3 % (ref 2.3–15.9)
RBC.: 2.8 MIL/uL — ABNORMAL LOW (ref 4.22–5.81)
Retic Count, Absolute: 47.9 10*3/uL (ref 19.0–186.0)
Retic Ct Pct: 1.7 % (ref 0.4–3.1)

## 2019-12-30 LAB — BASIC METABOLIC PANEL
Anion gap: 11 (ref 5–15)
BUN: 92 mg/dL — ABNORMAL HIGH (ref 6–20)
CO2: 19 mmol/L — ABNORMAL LOW (ref 22–32)
Calcium: 8.5 mg/dL — ABNORMAL LOW (ref 8.9–10.3)
Chloride: 113 mmol/L — ABNORMAL HIGH (ref 98–111)
Creatinine, Ser: 6.77 mg/dL — ABNORMAL HIGH (ref 0.61–1.24)
GFR calc Af Amer: 10 mL/min — ABNORMAL LOW (ref >60)
GFR calc non Af Amer: 9 mL/min — ABNORMAL LOW (ref >60)
Glucose, Bld: 100 mg/dL — ABNORMAL HIGH (ref 70–99)
Potassium: 3.9 mmol/L (ref 3.5–5.1)
Sodium: 143 mmol/L (ref 135–145)

## 2019-12-30 LAB — GLUCOSE, CAPILLARY
Glucose-Capillary: 42 mg/dL — CL (ref 70–99)
Glucose-Capillary: 63 mg/dL — ABNORMAL LOW (ref 70–99)
Glucose-Capillary: 100 mg/dL — ABNORMAL HIGH (ref 70–99)
Glucose-Capillary: 73 mg/dL (ref 70–99)
Glucose-Capillary: 65 mg/dL — ABNORMAL LOW (ref 70–99)
Glucose-Capillary: 111 mg/dL — ABNORMAL HIGH (ref 70–99)
Glucose-Capillary: 109 mg/dL — ABNORMAL HIGH (ref 70–99)
Glucose-Capillary: 157 mg/dL — ABNORMAL HIGH (ref 70–99)
Glucose-Capillary: 126 mg/dL — ABNORMAL HIGH (ref 70–99)
Glucose-Capillary: 114 mg/dL — ABNORMAL HIGH (ref 70–99)
Glucose-Capillary: 121 mg/dL — ABNORMAL HIGH (ref 70–99)
Glucose-Capillary: 114 mg/dL — ABNORMAL HIGH (ref 70–99)

## 2019-12-30 LAB — CBC
HCT: 26.8 % — ABNORMAL LOW (ref 39.0–52.0)
Hemoglobin: 8.5 g/dL — ABNORMAL LOW (ref 13.0–17.0)
MCH: 29.2 pg (ref 26.0–34.0)
MCHC: 31.7 g/dL (ref 30.0–36.0)
MCV: 92.1 fL (ref 80.0–100.0)
Platelets: 343 10*3/uL (ref 150–400)
RBC: 2.91 MIL/uL — ABNORMAL LOW (ref 4.22–5.81)
RDW: 14.2 % (ref 11.5–15.5)
WBC: 10.4 10*3/uL (ref 4.0–10.5)
nRBC: 0 % (ref 0.0–0.2)

## 2019-12-30 LAB — VITAMIN B12: Vitamin B-12: 509 pg/mL (ref 180–914)

## 2019-12-30 LAB — IRON AND TIBC
Iron: 55 ug/dL (ref 45–182)
Saturation Ratios: 31 % (ref 17.9–39.5)
TIBC: 175 ug/dL — ABNORMAL LOW (ref 250–450)
UIBC: 120 ug/dL

## 2019-12-30 LAB — FOLATE: Folate: 8.6 ng/mL (ref >5.9)

## 2019-12-30 LAB — PHOSPHORUS: Phosphorus: 6.9 mg/dL — ABNORMAL HIGH (ref 2.5–4.6)

## 2019-12-30 LAB — FERRITIN: Ferritin: 240 ng/mL (ref 24–336)

## 2019-12-30 MED ORDER — SODIUM BICARBONATE 650 MG PO TABS
650.0000 mg | ORAL_TABLET | Freq: Three times a day (TID) | ORAL | Status: DC
  Administered 2019-12-30 – 2020-01-01 (×6): 650 mg via ORAL
  Filled 2019-12-30 (×6): qty 1

## 2019-12-30 MED ORDER — DEXTROSE 50 % IV SOLN: 1.0000 | Freq: Once | INTRAVENOUS | Status: AC

## 2019-12-30 MED ORDER — DEXTROSE 50 % IV SOLN
INTRAVENOUS | Status: AC
  Administered 2019-12-30: 25 mL
  Filled 2019-12-30: qty 50

## 2019-12-30 NOTE — Consult Note (Signed)
HELMER DULL MRN: [id number] DOB/AGE: 09/19/1975 44 y.o. Primary Care Physician:Berglund, Jesse Sans, MD Admit date: 12/29/2019 Chief Complaint:  Chief Complaint  Patient presents with  . Hypoglycemia   HPI: Patient is a 44 year old African-American male with a past medical history of diabetes mellitus type 2, hypertension, CKD stage IIIb, prostate cancer, morbid obesity, hyperlipidemia who was brought to the ER with chief complaint of unresponsiveness  History of present illness date back to yesterday when patient was found unresponsive at home.  9 1 was called and upon EMS arrival patient had patient blood glucose was noted to be 32. Patient was earlier hospitalized in April with similar complaints and his glimepiride were held. For some reason patient hypoglycemic agent was started back and patient became hypoglycemic last night and was brought to the hospital where he was found unresponsive  Patient was seen today on second floor Patient voices no new concerns No complaint of chest pain No complaint of fever cough or chills No complaint of recent Covid exposure No complaint of frequency urgency dysuria Nocturnal hematuria No complaint of change in vision.  Past Medical History:  Diagnosis Date  . Benign essential hematuria 12/04/2014   microscopic hematuria evaluation with CT and cysto - treated for 2 months with bactrim DS; no further f/u planned unless hematuria recurs.   . Controlled type 2 diabetes mellitus without complication, without long-term current use of insulin ([place]) 07/16/2015  . Essential (primary) hypertension 12/04/2014  . History of kidney stones   . prostate cancer   . Sleep apnea         Family History  Problem Relation Age of Onset  . Diabetes Mother   . Heart disease Mother   . Hypertension Mother   . Glaucoma Mother   . Prostate cancer Father   . Diabetes Maternal Grandmother   . Diabetes Maternal Grandfather   . Prostate cancer Maternal Uncle      Social History:  reports that he has never smoked. He has never used smokeless tobacco. He reports that he does not drink alcohol or use drugs.   Allergies:  Allergies  Allergen Reactions  . Percocet [Oxycodone-Acetaminophen] Nausea And Vomiting    Medications Prior to Admission  Medication Sig Dispense Refill  . amLODipine (NORVASC) 10 MG tablet TAKE 1 TABLET BY MOUTH EVERY DAY 90 tablet 3  . atorvastatin (LIPITOR) 10 MG tablet TAKE 1 TABLET BY MOUTH EVERY DAY WITH BREAKFAST (Patient taking differently: Take 10 mg by mouth daily. ) 90 tablet 3  . calcitRIOL (ROCALTROL) 0.25 MCG capsule Take 1 capsule (0.25 mcg total) by mouth daily. 90 capsule 1  . carvedilol (COREG) 6.25 MG tablet Take 6.25 mg by mouth 2 (two) times daily with a meal.     . cloNIDine (CATAPRES - DOSED IN MG/24 HR) 0.2 mg/24hr patch Place 1 patch (0.2 mg total) onto the skin once a week. 4 patch 6  . glimepiride (AMARYL) 2 MG tablet Take 1 tablet (2 mg total) by mouth daily. 90 tablet 0  . hydrALAZINE (APRESOLINE) 100 MG tablet Take 100 mg by mouth 3 (three) times daily.    Marland Kitchen LOKELMA 10 g PACK packet Take 10 g by mouth daily. MIX CONTENTS OF 1 PACKET IN THREE TABLESPOONSFUL OF WATER AND DRINK ONCE DAILY (TAKES EARLY IN THE MORNING)    . olmesartan (BENICAR) 40 MG tablet TAKE 1 TABLET(40 MG) BY MOUTH DAILY (Patient taking differently: Take 40 mg by mouth daily. ) 30 tablet 12  . traMADol (  ULTRAM) 50 MG tablet Take 1 tablet (50 mg total) by mouth every 8 (eight) hours as needed. 15 tablet 0       XTK:WIOXB from the symptoms mentioned above,there are no other symptoms referable to all systems reviewed.  Marland Kitchen amLODipine  10 mg Oral Daily  . atorvastatin  10 mg Oral Daily  . calcitRIOL  0.25 mcg Oral Daily  . carvedilol  6.25 mg Oral BID WC  . heparin injection (subcutaneous)  5,000 Units Subcutaneous Q8H  . hydrALAZINE  100 mg Oral TID  . sodium bicarbonate  650 mg Oral TID  . sodium zirconium cyclosilicate  10 g  Oral Daily       Physical Exam: Vital signs in last 24 hours: Temp:  [98 F (36.7 C)-98.7 F (37.1 C)] 98.7 F (37.1 C) (05/22 0805) Pulse Rate:  [87-94] 94 (05/22 0805) Resp:  [7-24] 24 (05/22 0805) BP: (142-181)/(67-102) 156/85 (05/22 0805) SpO2:  [98 %-100 %] 100 % (05/22 0805) Weight:  [147.4 kg-151.7 kg] 151.7 kg (05/22 0500) Weight change:  Last BM Date: 12/29/19  Intake/Output from previous day: 05/21 0701 - 05/22 0700 In: 333.9 [I.V.:333.9] Out: 650 [Urine:650] No intake/output data recorded.   Physical Exam: General- pt is awake,alert, oriented to time place and person Resp- No acute REsp distress, CTA B/L NO Rhonchi CVS- S1S2 regular in rate and rhythm GIT- BS+, soft, NT, ND EXT- NO LE Edema, Cyanosis CNS- CN 2-12 grossly intact. Moving all 4 extremities Psych- normal mood and affect    Lab Results: CBC Recent Labs    12/29/19 2112 12/30/19 0341  WBC 12.5* 10.4  HGB 10.0* 8.5*  HCT 30.4* 26.8*  PLT 403* 343    BMET Recent Labs    12/29/19 2112 12/30/19 0341  NA 143 143  K 4.7 3.9  CL 112* 113*  CO2 17* 19*  GLUCOSE 122* 100*  BUN 89* 92*  CREATININE 6.98* 6.77*  CALCIUM 8.7* 8.5*   Creatinine trend 2021 6.9--> 6.7 3.4--4.7 in April admission  2020 2.7--2.9 2019 2.5--2.7 2018 2.0--2.6 2017 1.5 2016 1.9    MICRO No results found for this or any previous visit (from the past 240 hour(s)).    Lab Results  Component Value Date   CALCIUM 8.5 (L) 12/30/2019      Impression: 1)Renal  AKI secondary to ATN Patient had AKI on CKD Patient has CKD stage IV Patient CKD since 2016 Patient CKD most likely secondary to long-term history of diabetes mellitus    2)HTN Blood pressure is at goal for acute state We will follow-up on the blood pressure blood pressure not better will increase Coreg dose  3)Anemia of chronic disease Hemoglobin is not at goal We will initiate anemia work-up We will decide about IV iron versus  Epogen depending upon the results  4) secondary hyperparathyroidism CKD Mineral-Bone Disorder Patient does have a history of secondary hyperparathyroid thyroidism with PTH levels going up to 166 Phosphorus level in the past being as high as 6.3 We will recheck patient's labs  5) hypoglycemia Now better   6) electrolytes   normokalemic NOrmonatremic   7)Acid base Co2 is not at goal  Non-anion gap acidosis Will start patient on sodium bicarb  Plan:  Agree with current IV fluids AKI work-up-pending Renal ultrasound reviewed We will ask for bladder scan We will ask for I's and O's Will ask for anemia work-up I did discuss case kidney related issues with the the patient.  I discussed about his current  GFR.   I educated  patient but possible need for renal replacement therapy. No acute need for renal placement therapy at this time We will continue to follow closely   Eber Ferrufino s [facility] 12/30/2019, 11:46 AM

## 2019-12-30 NOTE — Progress Notes (Signed)
PROGRESS NOTE    [name]  FGH:[id number] DOB: 12/09/1975 DOA: 12/29/2019 PCP: Glean Hess, MD   Brief Narrative:  Benjamin Stewart is a 44 y.o. male with medical history significant for History of type 2 diabetes, hypertension, CKD stage IIIb, prostate cancer, morbid obesity, and hyperlipidemia who presents with an episode of unresponsiveness.  Patient lives with his brother and was found unresponsive today.  Last known well was last night.  Patient only recalls going to bed last night and then waking up to EMS.  His blood glucose was noted to be 32.  He reports that he was otherwise in his normal state of health.  Patient was previously hospitalized several weeks ago for hypoglycemia and had cessation of his glimepiride.  However he had follow-up with his PCP afterwards and his medication was resumed.   Also found to have AKI with stage IV CKD.He had acute renal failure during his last hospitalization that was thought due to ibuprofen that he was taking for dental pain.  However patient states that he has since discontinued all NSAIDs.  Reports that he has been staying hydrated.  Denies any changes to his urine output.  Subjective: Patient was complaining of tongue swelling and pain as he bit on his tongue last night when unresponsive.  Denies any difficulty swallowing or breathing.  Assessment & Plan:   Principal Problem:   Hypoglycemia Active Problems:   Essential (primary) hypertension   Morbid obesity, unspecified obesity type (HCC)   Type II diabetes mellitus with complication (HCC)   AKI (acute kidney injury) ([place])   CKD (chronic kidney disease) stage 3, GFR 30-59 ml/min   Anemia   COVID-19 virus infection  Hypoglycemia with type 2 diabetes.  Most likely the cause of his unresponsiveness. Hypoglycemia secondary to glimepiride, as his renal functions has been deteriorated resulted in delayed clearance.  Discussed with patient that he should not take that medicine  anymore.  If he needs anything for his blood sugars, he can be started on a low-dose insulin.  A1c 5.2. Continue to drop blood glucose requiring some intervention. -Monitor blood glucose level. -No more glimepiride.  Tongue edema.  Most likely traumatic as there was some bruising on right side of his tongue.  Patient was not on any ACE inhibitor.  No difficulty with breathing or swallowing. -Try softer a pured diet as he is having difficulty chewing.  Acute on chronic CKD stage IIIb Significantly worsening creatinine up to 6.98 from a likely baseline of 2.7.  Very mild improvement to 6.77 today. Unclear etiology of his worsening renal function. CK mildly elevated at 566.  Renal ultrasound without any hydronephrosis and more consistent with medical renal disease. Nephrology was consulted-appreciate their recommendations. No need for emergent dialysis but patient most likely reaching to that point. -Urine studies pending. -Continue to monitor renal function. -Nephrotoxins.  HTN Continue Coreg, hydralazine, and amlodipine. Hold olmesartan  Recent COVID-19 infection Positive on 4/1. Does not need repeat testing as he will remain positive for some time.  Asymptomatic without hypoxia.  Chronic anemia Stable   Morbid obesity BMI greater than 44.  -Discussed with him the importance of losing weight as he might required renal transplant in the future.  Per patient he is trying to lose weight, has lost more than 40 pounds.  Objective: Vitals:   12/30/19 0436 12/30/19 0500 12/30/19 0805 12/30/19 1150  BP: (!) 142/87  (!) 156/85 (!) 159/86  Pulse: 92  94 92  Resp: 18  (!)  24 20  Temp: 98.6 F (37 C)  98.7 F (37.1 C) 99 F (37.2 C)  TempSrc: Oral  Oral Oral  SpO2: 98%  100% 100%  Weight:  (!) 151.7 kg    Height:        Intake/Output Summary (Last 24 hours) at 12/30/2019 1324 Last data filed at 12/30/2019 0400 Gross per 24 hour  Intake 333.93 ml  Output 650 ml  Net -316.07 ml    Filed Weights   12/29/19 2107 12/30/19 0500  Weight: (!) 147.4 kg (!) 151.7 kg    Examination:  General exam: Morbidly obese gentleman, appears calm and comfortable.  Some bruising on right side of proximal tongue with surrounding edema. Respiratory system: Clear to auscultation. Respiratory effort normal. Cardiovascular system: S1 & S2 heard, RRR. No JVD, murmurs, rubs, gallops Gastrointestinal system: Soft, nontender, nondistended, bowel sounds positive. Central nervous system: Alert and oriented. No focal neurological deficits. Extremities: No edema, no cyanosis, pulses intact and symmetrical. Skin: No rashes, lesions or ulcers Psychiatry: Judgement and insight appear normal. Mood & affect appropriate.    DVT prophylaxis: Lovenox Code Status: Full Family Communication: Discussed with patient. Disposition Plan:  Status is: Inpatient  Remains inpatient appropriate because:Inpatient level of care appropriate due to severity of illness   Dispo: The patient is from: Home              Anticipated d/c is to: Home              Anticipated d/c date is: 1 day              Patient currently is not medically stable to d/c.  Patient with worsening renal function.  If continue to show some improvement he can go home tomorrow and will have a close follow-up with nephrology.  Consultants:   Nephrology  Procedures:  Antimicrobials:   Data Reviewed: I have personally reviewed following labs and imaging studies  CBC: Recent Labs  Lab 12/29/19 2112 12/30/19 0341  WBC 12.5* 10.4  NEUTROABS 11.5*  --   HGB 10.0* 8.5*  HCT 30.4* 26.8*  MCV 89.7 92.1  PLT 403* 426   Basic Metabolic Panel: Recent Labs  Lab 12/29/19 2112 12/30/19 0341 12/30/19 1202  NA 143 143  --   K 4.7 3.9  --   CL 112* 113*  --   CO2 17* 19*  --   GLUCOSE 122* 100*  --   BUN 89* 92*  --   CREATININE 6.98* 6.77*  --   CALCIUM 8.7* 8.5*  --   PHOS  --   --  6.9*   GFR: Estimated Creatinine  Clearance: 20.7 mL/min (A) (by C-G formula based on SCr of 6.77 mg/dL (H)). Liver Function Tests: No results for input(s): AST, ALT, ALKPHOS, BILITOT, PROT, ALBUMIN in the last 168 hours. No results for input(s): LIPASE, AMYLASE in the last 168 hours. No results for input(s): AMMONIA in the last 168 hours. Coagulation Profile: No results for input(s): INR, PROTIME in the last 168 hours. Cardiac Enzymes: Recent Labs  Lab 12/29/19 2248  CKTOTAL 566*   BNP (last 3 results) No results for input(s): PROBNP in the last 8760 hours. HbA1C: No results for input(s): HGBA1C in the last 72 hours. CBG: Recent Labs  Lab 12/30/19 0335 12/30/19 0610 12/30/19 0802 12/30/19 0952 12/30/19 1147  GLUCAP 100* 73 65* 111* 109*   Lipid Profile: No results for input(s): CHOL, HDL, LDLCALC, TRIG, CHOLHDL, LDLDIRECT in the last 72 hours. Thyroid  Function Tests: No results for input(s): TSH, T4TOTAL, FREET4, T3FREE, THYROIDAB in the last 72 hours. Anemia Panel: Recent Labs    12/30/19 1202  FOLATE 8.6  FERRITIN 240  TIBC 175*  IRON 55  RETICCTPCT 1.7   Sepsis Labs: Recent Labs  Lab 12/29/19 2248  LATICACIDVEN 1.8    No results found for this or any previous visit (from the past 240 hour(s)).   Radiology Studies: US RENAL  Result Date: 12/30/2019 CLINICAL DATA:  Acute renal failure EXAM: RENAL / URINARY TRACT ULTRASOUND COMPLETE COMPARISON:  CT of the pelvis dated 04/19/2018 FINDINGS: Right Kidney: Renal measurements: 11.7 x 5.8 x 5.4 cm = volume: 192 mL. There is no hydronephrosis. There is increased cortical echogenicity. There is a 2 cm cyst in the upper pole. Left Kidney: Renal measurements: 14 x 6.4 x 6.2 cm = volume: 289 mL. There is increased cortical echogenicity without evidence for hydronephrosis. Bladder: Appears normal for degree of bladder distention. Other: None. IMPRESSION: 1. No acute abnormality. 2. Echogenic kidneys bilaterally which can be seen in patients with medical  renal disease. 3. No hydronephrosis. Electronically Signed   By: Constance Holster M.D.   On: 12/30/2019 01:36   DG Chest Portable 1 View  Result Date: 12/29/2019 CLINICAL DATA:  Found unresponsive, syncope EXAM: PORTABLE CHEST 1 VIEW COMPARISON:  11/10/2019 FINDINGS: 2 frontal views of the chest demonstrate a stable cardiac silhouette. No airspace disease, effusion, or pneumothorax. No acute bony abnormality. IMPRESSION: 1. No acute intrathoracic process. Electronically Signed   By: Randa Ngo M.D.   On: 12/29/2019 22:10    Scheduled Meds:  amLODipine  10 mg Oral Daily   atorvastatin  10 mg Oral Daily   calcitRIOL  0.25 mcg Oral Daily   carvedilol  6.25 mg Oral BID WC   heparin injection (subcutaneous)  5,000 Units Subcutaneous Q8H   hydrALAZINE  100 mg Oral TID   sodium bicarbonate  650 mg Oral TID   sodium zirconium cyclosilicate  10 g Oral Daily   Continuous Infusions:  lactated ringers 100 mL/hr at 12/30/19 0400     LOS: 1 day   Time spent: 40 minutes.  Lorella Nimrod, MD [facility]  If 7PM-7AM, please contact night-coverage [email or web address]  12/30/2019, 1:24 PM   This record has been created using Systems analyst. Errors have been sought and corrected,but may not always be located. Such creation errors do not reflect on the standard of care.

## 2019-12-31 ENCOUNTER — Inpatient Hospital Stay: Payer: 59

## 2019-12-31 LAB — GLUCOSE, CAPILLARY
Glucose-Capillary: 116 mg/dL — ABNORMAL HIGH (ref 70–99)
Glucose-Capillary: 121 mg/dL — ABNORMAL HIGH (ref 70–99)
Glucose-Capillary: 129 mg/dL — ABNORMAL HIGH (ref 70–99)
Glucose-Capillary: 131 mg/dL — ABNORMAL HIGH (ref 70–99)
Glucose-Capillary: 135 mg/dL — ABNORMAL HIGH (ref 70–99)
Glucose-Capillary: 143 mg/dL — ABNORMAL HIGH (ref 70–99)
Glucose-Capillary: 151 mg/dL — ABNORMAL HIGH (ref 70–99)
Glucose-Capillary: 157 mg/dL — ABNORMAL HIGH (ref 70–99)
Glucose-Capillary: 174 mg/dL — ABNORMAL HIGH (ref 70–99)
Glucose-Capillary: 182 mg/dL — ABNORMAL HIGH (ref 70–99)

## 2019-12-31 LAB — RENAL FUNCTION PANEL
Albumin: 2.7 g/dL — ABNORMAL LOW (ref 3.5–5.0)
Anion gap: 11 (ref 5–15)
BUN: 87 mg/dL — ABNORMAL HIGH (ref 6–20)
CO2: 20 mmol/L — ABNORMAL LOW (ref 22–32)
Calcium: 8.8 mg/dL — ABNORMAL LOW (ref 8.9–10.3)
Chloride: 112 mmol/L — ABNORMAL HIGH (ref 98–111)
Creatinine, Ser: 6.89 mg/dL — ABNORMAL HIGH (ref 0.61–1.24)
GFR calc Af Amer: 10 mL/min — ABNORMAL LOW (ref >60)
GFR calc non Af Amer: 9 mL/min — ABNORMAL LOW (ref >60)
Glucose, Bld: 139 mg/dL — ABNORMAL HIGH (ref 70–99)
Phosphorus: 6.4 mg/dL — ABNORMAL HIGH (ref 2.5–4.6)
Potassium: 4.1 mmol/L (ref 3.5–5.1)
Sodium: 143 mmol/L (ref 135–145)

## 2019-12-31 LAB — CBC
HCT: 26 % — ABNORMAL LOW (ref 39.0–52.0)
Hemoglobin: 8.6 g/dL — ABNORMAL LOW (ref 13.0–17.0)
MCH: 29.9 pg (ref 26.0–34.0)
MCHC: 33.1 g/dL (ref 30.0–36.0)
MCV: 90.3 fL (ref 80.0–100.0)
Platelets: 326 10*3/uL (ref 150–400)
RBC: 2.88 MIL/uL — ABNORMAL LOW (ref 4.22–5.81)
RDW: 14.6 % (ref 11.5–15.5)
WBC: 8 10*3/uL (ref 4.0–10.5)
nRBC: 0 % (ref 0.0–0.2)

## 2019-12-31 MED ORDER — INSULIN ASPART 100 UNIT/ML ~~LOC~~ SOLN: 0.0000 [IU] | Freq: Every day | SUBCUTANEOUS | Status: DC

## 2019-12-31 MED ORDER — INSULIN ASPART 100 UNIT/ML ~~LOC~~ SOLN
0.0000 [IU] | Freq: Three times a day (TID) | SUBCUTANEOUS | Status: DC
  Administered 2020-01-01: 1 [IU] via SUBCUTANEOUS
  Filled 2019-12-31: qty 1

## 2019-12-31 MED ORDER — SEVELAMER CARBONATE 800 MG PO TABS
800.0000 mg | ORAL_TABLET | Freq: Two times a day (BID) | ORAL | Status: DC
  Administered 2019-12-31 – 2020-01-01 (×2): 800 mg via ORAL
  Filled 2019-12-31 (×2): qty 1

## 2019-12-31 MED ORDER — EPOETIN ALFA 4000 UNIT/ML IJ SOLN
3000.0000 [IU] | INTRAMUSCULAR | Status: DC
  Administered 2019-12-31: 3000 [IU] via SUBCUTANEOUS
  Filled 2019-12-31: qty 1

## 2019-12-31 NOTE — TOC Initial Note (Signed)
Transition of Care [facility]) - Initial/Assessment Note    Patient Details  Name: Benjamin Stewart MRN: [id number] Date of Birth: Mar 22, 1976  Transition of Care [facility]) CM/SW Contact:    Meriel Flavors, LCSW Phone Number: 12/31/2019, 4:37 PM  Clinical Narrative:                 Patient arrived to ER via [place] found Pt in floor unresponsive, no idea how long he could have been out. No TOC needs identified at this time. Patient will likely return home/self at D/C/       Patient Goals and CMS Choice        Expected Discharge Plan and Services                                                Prior Living Arrangements/Services                       Activities of Daily Living Home Assistive Devices/Equipment: None ADL Screening (condition at time of admission) Patient's cognitive ability adequate to safely complete daily activities?: Yes Is the patient deaf or have difficulty hearing?: No Does the patient have difficulty seeing, even when wearing glasses/contacts?: No Does the patient have difficulty concentrating, remembering, or making decisions?: No Patient able to express need for assistance with ADLs?: Yes Does the patient have difficulty dressing or bathing?: No Independently performs ADLs?: Yes (appropriate for developmental age) Does the patient have difficulty walking or climbing stairs?: No Weakness of Legs: None Weakness of Arms/Hands: None  Permission Sought/Granted                  Emotional Assessment              Admission diagnosis:  Hypoglycemia [E16.2] Acute renal failure (ARF) ([place]) [N17.9] Acute renal failure, unspecified acute renal failure type ([place]) [N17.9] Patient Active Problem List   Diagnosis Date Noted  . Anemia 12/29/2019  . COVID-19 virus infection 12/29/2019  . AKI (acute kidney injury) ([place]) 11/09/2019  . CKD (chronic kidney disease) stage 3, GFR 30-59 ml/min 11/09/2019  . Postural dizziness  with presyncope 11/09/2019  . Hypoglycemia 11/09/2019  . Secondary hyperparathyroidism of renal origin ([place]) 04/24/2019  . Hyperkalemia 04/24/2019  . Localized edema 01/19/2019  . Chronic gout of multiple sites 08/16/2018  . Proteinuria due to type 2 diabetes mellitus ([place]) 08/22/2017  . Prostate cancer ([place]) 08/23/2015  . Type II diabetes mellitus with complication ([place]) 28/63/8177  . Hyperlipidemia associated with type 2 diabetes mellitus ([place]) 07/16/2015  . Essential (primary) hypertension 12/04/2014  . Morbid obesity, unspecified obesity type ([place]) 12/04/2014   PCP:  Glean Hess, MD Pharmacy:   [facility] [place], [facility] [address] AT [place] [facility] [place] [address] Phone: [phone] Fax: [phone]     Social Determinants of Health (SDOH) Interventions    Readmission Risk Interventions No flowsheet data found.

## 2019-12-31 NOTE — Progress Notes (Addendum)
PROGRESS NOTE    Benjamin Stewart  BPZ:[id number] DOB: [birth year] DOA: 12/29/2019 PCP: Glean Hess, MD   Brief Narrative:  Benjamin Stewart is a 44 y.o. male with medical history significant for History of type 2 diabetes, hypertension, CKD stage IIIb, prostate cancer, morbid obesity, and hyperlipidemia who presents with an episode of unresponsiveness.  Patient lives with his brother and was found unresponsive today.  Last known well was last night.  Patient only recalls going to bed last night and then waking up to EMS.  His blood glucose was noted to be 32.  He reports that he was otherwise in his normal state of health.  Patient was previously hospitalized several weeks ago for hypoglycemia and had cessation of his glimepiride.  However he had follow-up with his PCP afterwards and his medication was resumed.   Also found to have AKI with stage IV CKD.He had acute renal failure during his last hospitalization that was thought due to ibuprofen that he was taking for dental pain.  However patient states that he has since discontinued all NSAIDs.  Reports that he has been staying hydrated.  Denies any changes to his urine output.  Subjective: Patient has no new complaints today.  Tongue swelling improving.  Assessment & Plan:   Principal Problem:   Hypoglycemia Active Problems:   Essential (primary) hypertension   Morbid obesity, unspecified obesity type (HCC)   Type II diabetes mellitus with complication (HCC)   AKI (acute kidney injury) ([place])   CKD (chronic kidney disease) stage 3, GFR 30-59 ml/min   Anemia   COVID-19 virus infection  Hypoglycemia with type 2 diabetes.  Resolved  Most likely the cause of his unresponsiveness. Hypoglycemia secondary to glimepiride, as his renal functions has been deteriorated resulted in delayed clearance.  Discussed with patient that he should not take that medicine anymore.  If he needs anything for his blood sugars, he can be started on a  low-dose insulin.  A1c 5.2. -Monitor blood glucose level. -No more glimepiride. -Add sensitive SSI  Tongue edema.  Improving Most likely traumatic as there was some bruising on right side of his tongue.  Patient was not on any ACE inhibitor.  No difficulty with breathing or swallowing. -Try softer a pured diet as he is having difficulty chewing.  Acute on chronic CKD stage IIIb Significantly worsening creatinine up to 6.98 from a likely baseline of 2.7.  Very mild improvement to 6.77 today. Unclear etiology of his worsening renal function. CK mildly elevated at 566.  Renal ultrasound without any hydronephrosis and more consistent with medical renal disease. Nephrology was consulted-appreciate their recommendations. No need for emergent dialysis but patient most likely reaching to that point. -Urine studies pending. -Nephrology would like to continue IV fluid for another day.  Chest x-ray without any sign of congestion. -Continue to monitor renal function. -Nephrotoxins.  HTN Continue Coreg, hydralazine, and amlodipine. Hold olmesartan  Recent COVID-19 infection Positive on 4/1. Does not need repeat testing as he will remain positive for some time.  Asymptomatic without hypoxia.  Chronic anemia.  Anemia panel suggestive of anemia of chronic disease. Stable   Morbid obesity BMI greater than 44.  -Discussed with him the importance of losing weight as he might required renal transplant in the future.  Per patient he is trying to lose weight, has lost more than 40 pounds.  Objective: Vitals:   12/30/19 1613 12/30/19 1942 12/31/19 0327 12/31/19 1249  BP: 137/84 139/79 140/74 (!) 141/86  Pulse:  90 92 94 90  Resp: (!) 24 20 20 20   Temp: 98.6 F (37 C) 98.9 F (37.2 C) 98.6 F (37 C) 98.5 F (36.9 C)  TempSrc: Oral Oral Oral Oral  SpO2: 100% 97% 97% 100%  Weight:      Height:        Intake/Output Summary (Last 24 hours) at 12/31/2019 1332 Last data filed at 12/31/2019  1000 Gross per 24 hour  Intake 1634.63 ml  Output 1050 ml  Net 584.63 ml   Filed Weights   12/29/19 2107 12/30/19 0500  Weight: (!) 147.4 kg (!) 151.7 kg    Examination:  General exam: Morbidly obese gentleman, appears calm and comfortable. Respiratory system: Clear to auscultation. Respiratory effort normal. Cardiovascular system: S1 & S2 heard, RRR. No JVD, murmurs, rubs, gallops Gastrointestinal system: Soft, nontender, nondistended, bowel sounds positive. Central nervous system: Alert and oriented. No focal neurological deficits. Extremities: No edema, no cyanosis, pulses intact and symmetrical. Skin: No rashes, lesions or ulcers Psychiatry: Judgement and insight appear normal. Mood & affect appropriate.    DVT prophylaxis: Lovenox Code Status: Full Family Communication: Discussed with patient. Disposition Plan:  Status is: Inpatient  Remains inpatient appropriate because:Inpatient level of care appropriate due to severity of illness   Dispo: The patient is from: Home              Anticipated d/c is to: Home              Anticipated d/c date is: 1-2 days.              Patient currently is not medically stable to d/c.  Patient with worsening renal function.  Nephrology would like to continue IV fluid for 1 more day before making any decision.  Consultants:   Nephrology  Procedures:  Antimicrobials:   Data Reviewed: I have personally reviewed following labs and imaging studies  CBC: Recent Labs  Lab 12/29/19 2112 12/30/19 0341 12/31/19 0336  WBC 12.5* 10.4 8.0  NEUTROABS 11.5*  --   --   HGB 10.0* 8.5* 8.6*  HCT 30.4* 26.8* 26.0*  MCV 89.7 92.1 90.3  PLT 403* 343 027   Basic Metabolic Panel: Recent Labs  Lab 12/29/19 2112 12/30/19 0341 12/30/19 1202 12/31/19 0336  NA 143 143  --  143  K 4.7 3.9  --  4.1  CL 112* 113*  --  112*  CO2 17* 19*  --  20*  GLUCOSE 122* 100*  --  139*  BUN 89* 92*  --  87*  CREATININE 6.98* 6.77*  --  6.89*  CALCIUM  8.7* 8.5*  --  8.8*  PHOS  --   --  6.9* 6.4*   GFR: Estimated Creatinine Clearance: 20.3 mL/min (A) (by C-G formula based on SCr of 6.89 mg/dL (H)). Liver Function Tests: Recent Labs  Lab 12/31/19 0336  ALBUMIN 2.7*   No results for input(s): LIPASE, AMYLASE in the last 168 hours. No results for input(s): AMMONIA in the last 168 hours. Coagulation Profile: No results for input(s): INR, PROTIME in the last 168 hours. Cardiac Enzymes: Recent Labs  Lab 12/29/19 2248  CKTOTAL 566*   BNP (last 3 results) No results for input(s): PROBNP in the last 8760 hours. HbA1C: No results for input(s): HGBA1C in the last 72 hours. CBG: Recent Labs  Lab 12/31/19 0327 12/31/19 0525 12/31/19 0808 12/31/19 1010 12/31/19 1141  GLUCAP 129* 121* 116* 174* 157*   Lipid Profile: No results for input(s): CHOL,  HDL, LDLCALC, TRIG, CHOLHDL, LDLDIRECT in the last 72 hours. Thyroid Function Tests: No results for input(s): TSH, T4TOTAL, FREET4, T3FREE, THYROIDAB in the last 72 hours. Anemia Panel: Recent Labs    12/30/19 1202  VITAMINB12 509  FOLATE 8.6  FERRITIN 240  TIBC 175*  IRON 55  RETICCTPCT 1.7   Sepsis Labs: Recent Labs  Lab 12/29/19 2248  LATICACIDVEN 1.8    No results found for this or any previous visit (from the past 240 hour(s)).   Radiology Studies: DG Chest 2 View  Result Date: 12/31/2019 CLINICAL DATA:  Hypoglycemia and possible renal failure EXAM: CHEST - 2 VIEW COMPARISON:  12/29/2019 FINDINGS: Cardiac shadow is stable. The lungs are well aerated bilaterally. No focal infiltrate or sizable effusion is seen. Degenerative changes of the thoracic spine are noted. IMPRESSION: No acute abnormality noted.  No change from the prior exam. Electronically Signed   By: Inez Catalina M.D.   On: 12/31/2019 13:17   US RENAL  Result Date: 12/30/2019 CLINICAL DATA:  Acute renal failure EXAM: RENAL / URINARY TRACT ULTRASOUND COMPLETE COMPARISON:  CT of the pelvis dated 04/19/2018  FINDINGS: Right Kidney: Renal measurements: 11.7 x 5.8 x 5.4 cm = volume: 192 mL. There is no hydronephrosis. There is increased cortical echogenicity. There is a 2 cm cyst in the upper pole. Left Kidney: Renal measurements: 14 x 6.4 x 6.2 cm = volume: 289 mL. There is increased cortical echogenicity without evidence for hydronephrosis. Bladder: Appears normal for degree of bladder distention. Other: None. IMPRESSION: 1. No acute abnormality. 2. Echogenic kidneys bilaterally which can be seen in patients with medical renal disease. 3. No hydronephrosis. Electronically Signed   By: Constance Holster M.D.   On: 12/30/2019 01:36   DG Chest Portable 1 View  Result Date: 12/29/2019 CLINICAL DATA:  Found unresponsive, syncope EXAM: PORTABLE CHEST 1 VIEW COMPARISON:  11/10/2019 FINDINGS: 2 frontal views of the chest demonstrate a stable cardiac silhouette. No airspace disease, effusion, or pneumothorax. No acute bony abnormality. IMPRESSION: 1. No acute intrathoracic process. Electronically Signed   By: Randa Ngo M.D.   On: 12/29/2019 22:10    Scheduled Meds:  amLODipine  10 mg Oral Daily   atorvastatin  10 mg Oral Daily   calcitRIOL  0.25 mcg Oral Daily   carvedilol  6.25 mg Oral BID WC   heparin injection (subcutaneous)  5,000 Units Subcutaneous Q8H   hydrALAZINE  100 mg Oral TID   sodium bicarbonate  650 mg Oral TID   sodium zirconium cyclosilicate  10 g Oral Daily   Continuous Infusions:  lactated ringers 100 mL/hr at 12/31/19 0532     LOS: 2 days   Time spent: 40 minutes.  Lorella Nimrod, MD [facility]  If 7PM-7AM, please contact night-coverage [email or web address]  12/31/2019, 1:32 PM   This record has been created using Systems analyst. Errors have been sought and corrected,but may not always be located. Such creation errors do not reflect on the standard of care.

## 2019-12-31 NOTE — Progress Notes (Addendum)
Benjamin Stewart  MRN: [id number]  DOB/AGE: Oct 29, 1975 44 y.o.  Primary Care Physician:Berglund, Jesse Sans, MD  Admit date: 12/29/2019  Chief Complaint:  Chief Complaint  Patient presents with  . Hypoglycemia    S-Pt presented on  12/29/2019 with  Chief Complaint  Patient presents with  . Hypoglycemia  .    Pt today feels better.  Patient brother was present in the room.  Patient ambulated major concern was tell us about how her kidneys are doing.I then reviewed the patient's chart at length.  Medications . amLODipine  10 mg Oral Daily  . atorvastatin  10 mg Oral Daily  . calcitRIOL  0.25 mcg Oral Daily  . carvedilol  6.25 mg Oral BID WC  . heparin injection (subcutaneous)  5,000 Units Subcutaneous Q8H  . hydrALAZINE  100 mg Oral TID  . sodium bicarbonate  650 mg Oral TID  . sodium zirconium cyclosilicate  10 g Oral Daily         CBJ:SEGBT from the symptoms mentioned above,there are no other symptoms referable to all systems reviewed.  Physical Exam: Vital signs in last 24 hours: Temp:  [98.6 F (37 C)-99 F (37.2 C)] 98.6 F (37 C) (05/23 0327) Pulse Rate:  [90-94] 94 (05/23 0327) Resp:  [20-24] 20 (05/23 0327) BP: (137-159)/(74-86) 140/74 (05/23 0327) SpO2:  [97 %-100 %] 97 % (05/23 0327) Weight change:  Last BM Date: 12/29/19  Intake/Output from previous day: 05/22 0701 - 05/23 0700 In: 1274.6 [I.V.:1274.6] Out: 1050 [Urine:1050] No intake/output data recorded.   Physical Exam: General- pt is awake,alert, oriented to time place and person Resp- No acute REsp distress, CTA B/L NO Rhonchi CVS- S1S2 regular in rate and rhythm GIT- BS+, soft, NT, ND EXT- NO LE Edema, Cyanosis   Lab Results: CBC Recent Labs    12/30/19 0341 12/31/19 0336  WBC 10.4 8.0  HGB 8.5* 8.6*  HCT 26.8* 26.0*  PLT 343 326    BMET Recent Labs    12/30/19 0341 12/31/19 0336  NA 143 143  K 3.9 4.1  CL 113* 112*  CO2 19* 20*  GLUCOSE 100* 139*  BUN 92* 87*   CREATININE 6.77* 6.89*  CALCIUM 8.5* 8.8*   Creatinine trend 2021 6.7--6.9 3.4--4.7 in April admission  2020 2.7--2.9 2019 2.5--2.7 2018 2.0--2.6 2017 1.5 2016 1.9    MICRO No results found for this or any previous visit (from the past 240 hour(s)).    Lab Results  Component Value Date   CALCIUM 8.8 (L) 12/31/2019   PHOS 6.4 (H) 12/31/2019               Impression: 1)Renal  AKI secondary to ATN Patient had AKI on CKD Patient has CKD stage IV Patient CKD since 2016 Patient CKD most likely secondary to long-term history of diabetes mellitus    2)HTN Blood pressure is at goal.   3)Anemia of chronic disease Hemoglobin is not at goal Anemia work-up done yesterday shows iron saturation of 31% We will start patient on Epogen  4) secondary hyperparathyroidism CKD Mineral-Bone Disorder Patient does have a history of secondary hyperparathyroid thyroidism with PTH levels going up to 166 Phosphorus is high We will start patient on binders  5) hypoglycemia Now better   6) electrolytes   normokalemic NOrmonatremic   7)Acid base Co2 is not at goal  Non-anion gap acidosis  patient on sodium bicarb   Plan:   Patient creatinine is stable at 6.7-6.9.  We will ask for  chest x-ray this morning and decide patient fluid status.  We will start patient on binders We will start patient on Epogen  Addendum Patient chest x-ray is clear We will continue IV fluids for now   I did discuss patient's kidney related issue at length with patient and his brother after taking patient permission   Manpreet s Theador Hawthorne 12/31/2019, 10:28 AM

## 2020-01-01 LAB — RENAL FUNCTION PANEL
Albumin: 2.6 g/dL — ABNORMAL LOW (ref 3.5–5.0)
Anion gap: 11 (ref 5–15)
BUN: 84 mg/dL — ABNORMAL HIGH (ref 6–20)
CO2: 21 mmol/L — ABNORMAL LOW (ref 22–32)
Calcium: 8.7 mg/dL — ABNORMAL LOW (ref 8.9–10.3)
Chloride: 111 mmol/L (ref 98–111)
Creatinine, Ser: 6.81 mg/dL — ABNORMAL HIGH (ref 0.61–1.24)
GFR calc Af Amer: 10 mL/min — ABNORMAL LOW (ref >60)
GFR calc non Af Amer: 9 mL/min — ABNORMAL LOW (ref >60)
Glucose, Bld: 134 mg/dL — ABNORMAL HIGH (ref 70–99)
Phosphorus: 6.1 mg/dL — ABNORMAL HIGH (ref 2.5–4.6)
Potassium: 3.5 mmol/L (ref 3.5–5.1)
Sodium: 143 mmol/L (ref 135–145)

## 2020-01-01 LAB — GLUCOSE, CAPILLARY
Glucose-Capillary: 121 mg/dL — ABNORMAL HIGH (ref 70–99)
Glucose-Capillary: 121 mg/dL — ABNORMAL HIGH (ref 70–99)

## 2020-01-01 MED ORDER — SODIUM BICARBONATE 650 MG PO TABS: 650.0000 mg | ORAL_TABLET | Freq: Three times a day (TID) | ORAL | 1 refills | Status: DC

## 2020-01-01 MED ORDER — SEVELAMER CARBONATE 800 MG PO TABS: 800.0000 mg | ORAL_TABLET | Freq: Two times a day (BID) | ORAL | 1 refills | Status: DC

## 2020-01-01 NOTE — TOC Transition Note (Signed)
Transition of Care [facility]) - CM/SW Discharge Note   Patient Details  Name: Benjamin Stewart MRN: [id number] Date of Birth: 1976-03-06  [facility] of [facility]) CM/SW Contact:  Candie Chroman, LCSW Phone Number: 01/01/2020, 2:10 PM   Clinical Narrative: Patient has orders to discharge home today. No further concerns. CSW signing off.    Final next level of care: Home/Self Care Barriers to Discharge: Barriers Resolved   Patient Goals and CMS Choice        Discharge Placement                    Patient and family notified of of transfer: 01/01/20  Discharge Plan and Services                                     Social Determinants of Health (SDOH) Interventions     Readmission Risk Interventions Readmission Risk Prevention Plan 01/01/2020  Transportation Screening Complete  PCP or Specialist Appt within 3-5 Days Complete  Social Work Consult for [place] Planning/Counseling [place] Not Applicable  Medication Review Press photographer) Complete  Some recent data might be hidden

## 2020-01-01 NOTE — TOC Progression Note (Signed)
Transition of Care [facility]) - Progression Note    Patient Details  Name: Benjamin Stewart MRN: [id number] Date of Birth: [birth year]  Transition [facility]) CM/SW [place], LCSW Phone Number: 01/01/2020, 10:16 AM  Clinical Narrative:  Readmission prevention screen complete. Patient lives home with his brother. His PCP is [facility]ens at [facility]. Patient drives to his appointments. Pharmacy is [facility] in [place]. No issues obtaining medications. No home health or DME use prior to admission. No further concerns. CSW encouraged patient to contact CSW as needed. CSW will continue to follow patient for support and facilitate return home when stable.   Expected Discharge Plan and Services                                                 Social Determinants of Health (SDOH) Interventions    Readmission Risk Interventions Readmission Risk Prevention Plan 01/01/2020  Transportation Screening Complete  PCP or Specialist Appt within 3-5 Days Complete  Social Work Consult for [place] Planning/Counseling Complete  Palliative Care Screening Not Applicable  Medication Review Press photographer) Complete  Some recent data might be hidden

## 2020-01-01 NOTE — Progress Notes (Signed)
[facility], [place] 01/01/20  Subjective:   Hospital day # 3  Feels well today No N/v reported No SOB Asking about going home  05/23 0701 - 05/24 0700 In: 2469.5 [P.O.:480; I.V.:1989.5] Out: 1000 [Urine:1000] Lab Results  Component Value Date   CREATININE 6.81 (H) 01/01/2020   CREATININE 6.89 (H) 12/31/2019   CREATININE 6.77 (H) 12/30/2019     Objective:  Vital signs in last 24 hours:  Temp:  [98.3 F (36.8 C)-98.5 F (36.9 C)] 98.3 F (36.8 C) (05/24 0509) Pulse Rate:  [85-95] 95 (05/24 0509) Resp:  [16-20] 16 (05/24 0509) BP: (141-148)/(74-86) 142/85 (05/24 0509) SpO2:  [99 %-100 %] 99 % (05/24 0509)  Weight change:  Filed Weights   12/29/19 2107 12/30/19 0500  Weight: (!) 147.4 kg (!) 151.7 kg    Intake/Output:    Intake/Output Summary (Last 24 hours) at 01/01/2020 0916 Last data filed at 01/01/2020 0511 Gross per 24 hour  Intake 2469.52 ml  Output 1000 ml  Net 1469.52 ml   Physical Exam: General:  No acute distress,   HEENT  anicteric, moist oral mucous membrane  Pulm/lungs  normal breathing effort, lungs are clear to auscultation  CVS/Heart  regular rhythm, no rub or gallop  Abdomen:   Soft, nontender  Extremities:  No peripheral edema  Neurologic:  Alert, oriented, able to follow commands  Skin:  No acute rashes     Basic Metabolic Panel:  Recent Labs  Lab 12/29/19 2112 12/29/19 2112 12/30/19 0341 12/30/19 1202 12/31/19 0336 01/01/20 0605  NA 143  --  143  --  143 143  K 4.7  --  3.9  --  4.1 3.5  CL 112*  --  113*  --  112* 111  CO2 17*  --  19*  --  20* 21*  GLUCOSE 122*  --  100*  --  139* 134*  BUN 89*  --  92*  --  87* 84*  CREATININE 6.98*  --  6.77*  --  6.89* 6.81*  CALCIUM 8.7*   < > 8.5*  --  8.8* 8.7*  PHOS  --   --   --  6.9* 6.4* 6.1*   < > = values in this interval not displayed.     CBC: Recent Labs  Lab 12/29/19 2112 12/30/19 0341 12/31/19 0336  WBC 12.5* 10.4 8.0  NEUTROABS  11.5*  --   --   HGB 10.0* 8.5* 8.6*  HCT 30.4* 26.8* 26.0*  MCV 89.7 92.1 90.3  PLT 403* 343 326     No results found for: HEPBSAG, HEPBSAB, HEPBIGM    Microbiology:  No results found for this or any previous visit (from the past 240 hour(s)).  Coagulation Studies: No results for input(s): LABPROT, INR in the last 72 hours.  Urinalysis: No results for input(s): COLORURINE, LABSPEC, PHURINE, GLUCOSEU, HGBUR, BILIRUBINUR, KETONESUR, PROTEINUR, UROBILINOGEN, NITRITE, LEUKOCYTESUR in the last 72 hours.  Invalid input(s): APPERANCEUR    Imaging: DG Chest 2 View  Result Date: 12/31/2019 CLINICAL DATA:  Hypoglycemia and possible renal failure EXAM: CHEST - 2 VIEW COMPARISON:  12/29/2019 FINDINGS: Cardiac shadow is stable. The lungs are well aerated bilaterally. No focal infiltrate or sizable effusion is seen. Degenerative changes of the thoracic spine are noted. IMPRESSION: No acute abnormality noted.  No change from the prior exam. Electronically Signed   By: Inez Catalina M.D.   On: 12/31/2019 13:17     Medications:   . lactated ringers 100  mL/hr at 01/01/20 0248   . amLODipine  10 mg Oral Daily  . atorvastatin  10 mg Oral Daily  . calcitRIOL  0.25 mcg Oral Daily  . carvedilol  6.25 mg Oral BID WC  . epoetin (EPOGEN/PROCRIT) injection  3,000 Units Subcutaneous Q14 Days  . heparin injection (subcutaneous)  5,000 Units Subcutaneous Q8H  . hydrALAZINE  100 mg Oral TID  . insulin aspart  0-5 Units Subcutaneous QHS  . insulin aspart  0-9 Units Subcutaneous TID WC  . sevelamer carbonate  800 mg Oral BID WC  . sodium bicarbonate  650 mg Oral TID     Assessment/ Plan:  44 y.o. male with     Diabetes, hypertension, obesity, history of prostate cancer   admitted on 12/29/2019 for Hypoglycemia [E16.2] Acute renal failure (ARF) (HCC) [N17.9] Acute renal failure, unspecified acute renal failure type ([place]) [N17.9]   #. AKI on chronic kidney disease stage IV #Diabetes type 2  with CKD #Proteinuria Patient known to our practice from outpatient follow-up  AKI is likely secondary to ATN Patient brought in to ER via [facility]. Was found down at home for unknown amount of time. EMS discovered lowe blood sugar of 32. After blood sugar improved, Patient became regained consciousness Patient is following strict dietary regimen as outpatient He states he has lost 70 pounds  Patient was instructed to discontinue glimepiride.  He verbalized understanding.  Patient's creatinine is critically elevated but stable No acute uremic symptoms Avoid ACE inhibitor or ARB due to advanced CKD (may be able to start once he goes on dialysis) He will follow-up with Dr. Holley Raring as outpatient this Thursday at 1:30 PM Appointment card provided.    Lab Results  Component Value Date   HGBA1C 5.2 11/27/2019        LOS: [place] 5/24/20219:16 AM  [place], [place]  Note: This note was prepared with Dragon dictation. Any transcription errors are unintentional

## 2020-01-01 NOTE — Discharge Summary (Signed)
Physician Discharge Summary  Benjamin Stewart NLG:[id number] DOB: March 03, 1976 DOA: 12/29/2019  PCP: Glean Hess, MD  Admit date: 12/29/2019 Discharge date: 01/01/2020  Admitted From: Home Disposition: Home  Recommendations for Outpatient Follow-up:  1. Follow up with PCP in 1-2 weeks 2. Follow-up with nephrology within 1 week 3. Please obtain BMP/CBC in one week 4. Please follow up on the following pending results: None  Home Health: No Equipment/Devices: None Discharge Condition: Stable CODE STATUS: Full Diet recommendation: Heart Healthy / Carb Modified    Brief/Interim Summary: Benjamin Stewart a 44 y.o.malewith medical history significant forHistory of type 2 diabetes, hypertension, CKD stage IIIb, prostate cancer, morbid obesity, and hyperlipidemia who presents with an episode ofunresponsiveness.  Patient lives with his brother and was found unresponsive . Last known well was last night. Patient only recalls going to bed last night and then waking up to EMS. His blood glucose was noted to be 32. He reports that he was otherwise in his normal state of health. Patient was previously hospitalized several weeks ago for hypoglycemia and had cessation of his glimepiride. However he had follow-up with his PCP afterwards and his medication was resumed.  He did developed intermittent hypoglycemia for about 24-hour while in the hospital requiring intervention.  His blood glucose stabilizes before discharge.  He was advised never to use glimepiride again.  He needs to be placed on insulin if needed.  He was advised to follow-up with his primary care physician for further needs.  Patient has well-controlled diabetes at this time with A1c of 5.2.  He is trying to lose weight and eat healthy.  Also found to have AKI with stage IV CKD.He had acute renal failure during his last hospitalization that was thought due to ibuprofen that he was taking for dental pain. However patient states  that he has since discontinued all NSAIDs. Reports that he has been staying hydrated. Denies any changes to his urine output. Nephrology was consulted and they monitor his renal function by giving IV fluid with no change.  Creatinine remained stable around 6.7-6.8.  Looks like patient is reaching dialysis stage.  No signs of uremia.  He will have a close follow-up with his nephrologist for further management.  Patient will continue his medications for hypertension accept olmesartan which was discontinued due to AKI.  His primary care physician or nephrologist can restart it if needed.  And has chronic anemia.  Anemia panel suggestive of anemia of chronic disease.  It remained stable and he will be given EPO by nephrology.  Patient is morbidly obese with BMI greater than 44.  Per patient he is trying to lose weight.  We discussed about the importance of losing weight as he might required renal transplant in the future.  Seems motivated.   Discharge Diagnoses:  Principal Problem:   Hypoglycemia Active Problems:   Essential (primary) hypertension   Morbid obesity, unspecified obesity type (HCC)   Type II diabetes mellitus with complication (HCC)   AKI (acute kidney injury) ([place])   CKD (chronic kidney disease) stage 3, GFR 30-59 ml/min   Anemia   COVID-19 virus infection  Discharge Instructions  Discharge Instructions    Diet - low sodium heart healthy   Complete by: As directed    Discharge instructions   Complete by: As directed    It was pleasure taking care of you. Do not take glimepiride as it was causing decreased in your blood sugar. Your kidney function has been worsened and you  are almost at dialysis stage, please follow-up with your nephrologist closely for further management. Please watch your diet and follow-up with your primary care physician for further management of your diabetes if needed. Stopping Lokelma as your potassium was fine, you can restart it if needed for  high potassium.   Increase activity slowly   Complete by: As directed      Allergies as of 01/01/2020      Reactions   Percocet [oxycodone-acetaminophen] Nausea And Vomiting      Medication List    STOP taking these medications   glimepiride 2 MG tablet Commonly known as: AMARYL   Lokelma 10 g Pack packet Generic drug: sodium zirconium cyclosilicate   olmesartan 40 MG tablet Commonly known as: BENICAR     TAKE these medications   amLODipine 10 MG tablet Commonly known as: NORVASC TAKE 1 TABLET BY MOUTH EVERY DAY   atorvastatin 10 MG tablet Commonly known as: LIPITOR TAKE 1 TABLET BY MOUTH EVERY DAY WITH BREAKFAST What changed: See the new instructions.   calcitRIOL 0.25 MCG capsule Commonly known as: ROCALTROL Take 1 capsule (0.25 mcg total) by mouth daily.   carvedilol 6.25 MG tablet Commonly known as: COREG Take 6.25 mg by mouth 2 (two) times daily with a meal.   cloNIDine 0.2 mg/24hr patch Commonly known as: CATAPRES - Dosed in mg/24 hr Place 1 patch (0.2 mg total) onto the skin once a week.   hydrALAZINE 100 MG tablet Commonly known as: APRESOLINE Take 100 mg by mouth 3 (three) times daily.   sevelamer carbonate 800 MG tablet Commonly known as: RENVELA Take 1 tablet (800 mg total) by mouth 2 (two) times daily with a meal.   sodium bicarbonate 650 MG tablet Take 1 tablet (650 mg total) by mouth 3 (three) times daily.   traMADol 50 MG tablet Commonly known as: ULTRAM Take 1 tablet (50 mg total) by mouth every 8 (eight) hours as needed.      Follow-up Information    Glean Hess, MD Follow up.   Specialty: Internal Medicine Why: Please schedule hospital follow up appt within 5-7 days of discharge. Contact information: [address] [place] [address] [phone]        Lateef, Munsoor, MD. Schedule an appointment as soon as possible for a visit.   Specialty: Nephrology Contact information: [place] [address] [phone]          Allergies  Allergen Reactions  . Percocet [Oxycodone-Acetaminophen] Nausea And Vomiting    Consultations:  Nephrology  Procedures/Studies: DG Chest 2 View  Result Date: 12/31/2019 CLINICAL DATA:  Hypoglycemia and possible renal failure EXAM: CHEST - 2 VIEW COMPARISON:  12/29/2019 FINDINGS: Cardiac shadow is stable. The lungs are well aerated bilaterally. No focal infiltrate or sizable effusion is seen. Degenerative changes of the thoracic spine are noted. IMPRESSION: No acute abnormality noted.  No change from the prior exam. Electronically Signed   By: Inez Catalina M.D.   On: 12/31/2019 13:17   US RENAL  Result Date: 12/30/2019 CLINICAL DATA:  Acute renal failure EXAM: RENAL / URINARY TRACT ULTRASOUND COMPLETE COMPARISON:  CT of the pelvis dated 04/19/2018 FINDINGS: Right Kidney: Renal measurements: 11.7 x 5.8 x 5.4 cm = volume: 192 mL. There is no hydronephrosis. There is increased cortical echogenicity. There is a 2 cm cyst in the upper pole. Left Kidney: Renal measurements: 14 x 6.4 x 6.2 cm = volume: 289 mL. There is increased  cortical echogenicity without evidence for hydronephrosis. Bladder: Appears normal for degree of bladder distention. Other: None. IMPRESSION: 1. No acute abnormality. 2. Echogenic kidneys bilaterally which can be seen in patients with medical renal disease. 3. No hydronephrosis. Electronically Signed   By: Constance Holster M.D.   On: 12/30/2019 01:36   DG Chest Portable 1 View  Result Date: 12/29/2019 CLINICAL DATA:  Found unresponsive, syncope EXAM: PORTABLE CHEST 1 VIEW COMPARISON:  11/10/2019 FINDINGS: 2 frontal views of the chest demonstrate a stable cardiac silhouette. No airspace disease, effusion, or pneumothorax. No acute bony abnormality. IMPRESSION: 1. No acute intrathoracic process. Electronically Signed   By: Randa Ngo M.D.   On: 12/29/2019 22:10     Subjective: Patient has no new complaints.  He  would like to go home and follow-up with nephrology as an outpatient.  Discharge Exam: Vitals:   01/01/20 0509 01/01/20 1046  BP: (!) 142/85 (!) 158/89  Pulse: 95 85  Resp: 16   Temp: 98.3 F (36.8 C)   SpO2: 99% 100%   Vitals:   12/31/19 1249 12/31/19 1949 01/01/20 0509 01/01/20 1046  BP: (!) 141/86 (!) 148/74 (!) 142/85 (!) 158/89  Pulse: 90 85 95 85  Resp: 20 20 16    Temp: 98.5 F (36.9 C) 98.5 F (36.9 C) 98.3 F (36.8 C)   TempSrc: Oral Oral Oral   SpO2: 100% 99% 99% 100%  Weight:      Height:        General: Pt is alert, awake, not in acute distress Cardiovascular: RRR, S1/S2 +, no rubs, no gallops Respiratory: CTA bilaterally, no wheezing, no rhonchi Abdominal: Soft, NT, ND, bowel sounds + Extremities: no edema, no cyanosis   The results of significant diagnostics from this hospitalization (including imaging, microbiology, ancillary and laboratory) are listed below for reference.    Microbiology: No results found for this or any previous visit (from the past 240 hour(s)).   Labs: BNP (last 3 results) Recent Labs    11/09/19 0034  BNP 35.3   Basic Metabolic Panel: Recent Labs  Lab 12/29/19 2112 12/30/19 0341 12/30/19 1202 12/31/19 0336 01/01/20 0605  NA 143 143  --  143 143  K 4.7 3.9  --  4.1 3.5  CL 112* 113*  --  112* 111  CO2 17* 19*  --  20* 21*  GLUCOSE 122* 100*  --  139* 134*  BUN 89* 92*  --  87* 84*  CREATININE 6.98* 6.77*  --  6.89* 6.81*  CALCIUM 8.7* 8.5*  --  8.8* 8.7*  PHOS  --   --  6.9* 6.4* 6.1*   Liver Function Tests: Recent Labs  Lab 12/31/19 0336 01/01/20 0605  ALBUMIN 2.7* 2.6*   No results for input(s): LIPASE, AMYLASE in the last 168 hours. No results for input(s): AMMONIA in the last 168 hours. CBC: Recent Labs  Lab 12/29/19 2112 12/30/19 0341 12/31/19 0336  WBC 12.5* 10.4 8.0  NEUTROABS 11.5*  --   --   HGB 10.0* 8.5* 8.6*  HCT 30.4* 26.8* 26.0*  MCV 89.7 92.1 90.3  PLT 403* 343 326   Cardiac  Enzymes: Recent Labs  Lab 12/29/19 2248  CKTOTAL 566*   BNP: Invalid input(s): POCBNP CBG: Recent Labs  Lab 12/31/19 1535 12/31/19 1703 12/31/19 2215 01/01/20 0731 01/01/20 1135  GLUCAP 151* 131* 143* 121* 121*   D-Dimer No results for input(s): DDIMER in the last 72 hours. Hgb A1c No results for input(s): HGBA1C in the last  72 hours. Lipid Profile No results for input(s): CHOL, HDL, LDLCALC, TRIG, CHOLHDL, LDLDIRECT in the last 72 hours. Thyroid function studies No results for input(s): TSH, T4TOTAL, T3FREE, THYROIDAB in the last 72 hours.  Invalid input(s): FREET3 Anemia work up Recent Labs    12/30/19 1202  VITAMINB12 509  FOLATE 8.6  FERRITIN 240  TIBC 175*  IRON 55  RETICCTPCT 1.7   Urinalysis    Component Value Date/Time   COLORURINE STRAW (A) 11/09/2019 0034   APPEARANCEUR CLEAR (A) 11/09/2019 0034   APPEARANCEUR Hazy (A) 10/24/2015 0930   LABSPEC 1.006 11/09/2019 0034   LABSPEC 1.010 11/23/2012 0823   PHURINE 6.0 11/09/2019 0034   GLUCOSEU NEGATIVE 11/09/2019 0034   GLUCOSEU NEGATIVE 11/23/2012 0823   HGBUR MODERATE (A) 11/09/2019 0034   BILIRUBINUR NEGATIVE 11/09/2019 0034   BILIRUBINUR neg 08/21/2019 0851   BILIRUBINUR Negative 10/24/2015 0930   BILIRUBINUR NEGATIVE 11/23/2012 0823   KETONESUR NEGATIVE 11/09/2019 0034   PROTEINUR 100 (A) 11/09/2019 0034   UROBILINOGEN 0.2 08/21/2019 0851   NITRITE NEGATIVE 11/09/2019 0034   LEUKOCYTESUR NEGATIVE 11/09/2019 0034   LEUKOCYTESUR 1+ 11/23/2012 0823   Sepsis Labs Invalid input(s): PROCALCITONIN,  WBC,  LACTICIDVEN Microbiology No results found for this or any previous visit (from the past 240 hour(s)).  Time coordinating discharge: Over 30 minutes  SIGNED:  Lorella Nimrod, MD  [facility] 01/01/2020, 2:00 PM  If 7PM-7AM, please contact night-coverage [email or web address]  This record has been created using Systems analyst. Errors have been sought and corrected,but may  not always be located. Such creation errors do not reflect on the standard of care.

## 2020-01-01 NOTE — Progress Notes (Signed)
Benjamin Stewart to be D/C'd Home per MD order.  Discussed prescriptions and follow up appointments with the patient. Prescriptions given to patient, medication list explained in detail. Pt verbalized understanding.  Allergies as of 01/01/2020      Reactions   Percocet [oxycodone-acetaminophen] Nausea And Vomiting      Medication List    STOP taking these medications   glimepiride 2 MG tablet Commonly known as: AMARYL   Lokelma 10 g Pack packet Generic drug: sodium zirconium cyclosilicate   olmesartan 40 MG tablet Commonly known as: BENICAR     TAKE these medications   amLODipine 10 MG tablet Commonly known as: NORVASC TAKE 1 TABLET BY MOUTH EVERY DAY   atorvastatin 10 MG tablet Commonly known as: LIPITOR TAKE 1 TABLET BY MOUTH EVERY DAY WITH BREAKFAST What changed: See the new instructions.   calcitRIOL 0.25 MCG capsule Commonly known as: ROCALTROL Take 1 capsule (0.25 mcg total) by mouth daily.   carvedilol 6.25 MG tablet Commonly known as: COREG Take 6.25 mg by mouth 2 (two) times daily with a meal.   cloNIDine 0.2 mg/24hr patch Commonly known as: CATAPRES - Dosed in mg/24 hr Place 1 patch (0.2 mg total) onto the skin once a week.   hydrALAZINE 100 MG tablet Commonly known as: APRESOLINE Take 100 mg by mouth 3 (three) times daily.   sevelamer carbonate 800 MG tablet Commonly known as: RENVELA Take 1 tablet (800 mg total) by mouth 2 (two) times daily with a meal.   sodium bicarbonate 650 MG tablet Take 1 tablet (650 mg total) by mouth 3 (three) times daily.   traMADol 50 MG tablet Commonly known as: ULTRAM Take 1 tablet (50 mg total) by mouth every 8 (eight) hours as needed.       Vitals:   01/01/20 0509 01/01/20 1046  BP: (!) 142/85 (!) 158/89  Pulse: 95 85  Resp: 16   Temp: 98.3 F (36.8 C)   SpO2: 99% 100%    Skin clean, dry and intact without evidence of skin break down, no evidence of skin tears noted. IV catheter discontinued intact. Site  without signs and symptoms of complications. Dressing and pressure applied. Pt denies pain at this time. No complaints noted.  An After Visit Summary was printed and given to the patient. Patient escorted via [place], and D/C home via private auto.  Fuller Mandril, RN

## 2020-01-02 ENCOUNTER — Telehealth: Payer: Self-pay

## 2020-01-02 NOTE — Telephone Encounter (Signed)
Transition Care Management Follow-up Telephone Call  Date of discharge and from where: 01/01/20  How have you been since you were released from the hospital? Pt states he is doing okay other than difficulty eating due to his tongue being swollen from unknowingly biting it while blood sugar was elevated.  Any questions or concerns? Yes   Items Reviewed:  Did the pt receive and understand the discharge instructions provided? Yes   Medications obtained and verified? Yes   Any new allergies since your discharge? No   Dietary orders reviewed? Yes  Do you have support at home? Yes   Functional Questionnaire: (I = Independent and D = Dependent) ADLs: I  Bathing/Dressing- I  Meal Prep- I  Eating- I  Maintaining continence- I  Transferring/Ambulation- I  Managing Meds- i  Follow up appointments reviewed:   PCP Hospital f/u appt confirmed? Yes  Scheduled to see Dr. Army Melia on 01/09/20 @ 3:00.  [facility] f/u appt confirmed? Yes  Scheduled to see Dr. Holley Raring on 01/04/20.  Are transportation arrangements needed? No   If their condition worsens, is the pt aware to call PCP or go to the Emergency Dept.? Yes  Was the patient provided with contact information for the PCP's office or ED? Yes  Was to pt encouraged to call back with questions or concerns? Yes

## 2020-01-03 LAB — GLUCOSE, CAPILLARY: Glucose-Capillary: 38 mg/dL — CL (ref 70–99)

## 2020-01-09 ENCOUNTER — Telehealth (INDEPENDENT_AMBULATORY_CARE_PROVIDER_SITE_OTHER): Payer: Self-pay

## 2020-01-09 ENCOUNTER — Ambulatory Visit (INDEPENDENT_AMBULATORY_CARE_PROVIDER_SITE_OTHER): Payer: 59 | Admitting: Internal Medicine

## 2020-01-09 ENCOUNTER — Encounter: Payer: Self-pay | Admitting: Internal Medicine

## 2020-01-09 ENCOUNTER — Other Ambulatory Visit: Payer: Self-pay

## 2020-01-09 VITALS — BP 148/86 | HR 94 | Temp 99.6°F | Ht 72.0 in | Wt 320.0 lb

## 2020-01-09 DIAGNOSIS — E118 Type 2 diabetes mellitus with unspecified complications: Secondary | ICD-10-CM

## 2020-01-09 DIAGNOSIS — T801XXA Vascular complications following infusion, transfusion and therapeutic injection, initial encounter: Secondary | ICD-10-CM

## 2020-01-09 DIAGNOSIS — I10 Essential (primary) hypertension: Secondary | ICD-10-CM

## 2020-01-09 DIAGNOSIS — N186 End stage renal disease: Secondary | ICD-10-CM | POA: Diagnosis not present

## 2020-01-09 DIAGNOSIS — I809 Phlebitis and thrombophlebitis of unspecified site: Secondary | ICD-10-CM

## 2020-01-09 DIAGNOSIS — Z992 Dependence on renal dialysis: Secondary | ICD-10-CM | POA: Diagnosis not present

## 2020-01-09 NOTE — Telephone Encounter (Signed)
I attempted to contact the patient and a message was left for a return call. 

## 2020-01-09 NOTE — Progress Notes (Signed)
Date:  01/09/2020   Name:  Benjamin Stewart   DOB:  01/11/1976   MRN:  [id number]   Chief Complaint: Hospitalization Follow-up and Hyperglycemia (blood sugar dropped in hospital for 3 days ) Hospital follow up from [facility].  Admitted with hypoglycemia 12/29/19 to 01/01/20.   TOC call made to patient on 01/02/20.   Glimepiride was discontinued and he responded well to conservative treatment.  He was admitted several weeks earlier and was convinced that his sugars were low because he had not eaten.  He resumed medications and did well for a month until the second episode.  The medication will be discontinued permanently.   Diabetes He presents for his follow-up diabetic visit. He has type 2 diabetes mellitus. Pertinent negatives for hypoglycemia include no dizziness, headaches or nervousness/anxiousness. Associated symptoms include fatigue. Pertinent negatives for diabetes include no chest pain. When asked about current treatments, none were reported. An ACE inhibitor/angiotensin II receptor blocker is contraindicated.  Hypertension This is a chronic problem. The problem is controlled. Pertinent negatives include no chest pain, headaches, palpitations or shortness of breath. Past treatments include direct vasodilators, beta blockers and calcium channel blockers. The current treatment provides moderate improvement. Hypertensive end-organ damage includes kidney disease.  CKD - now end stage and needs to begin HD.   Nephrology is referring him to Vascular Surgery for AVG and also taking him on a tour of the local dialysis center.  Lab Results  Component Value Date   CREATININE 6.81 (H) 01/01/2020   BUN 84 (H) 01/01/2020   NA 143 01/01/2020   K 3.5 01/01/2020   CL 111 01/01/2020   CO2 21 (L) 01/01/2020   Lab Results  Component Value Date   CHOL 101 08/21/2019   HDL 25 (L) 08/21/2019   LDLCALC 59 08/21/2019   TRIG 86 08/21/2019   CHOLHDL 4.0 08/21/2019   Lab Results  Component Value Date   TSH 1.173 11/09/2019   Lab Results  Component Value Date   HGBA1C 5.2 11/27/2019   Lab Results  Component Value Date   WBC 8.0 12/31/2019   HGB 8.6 (L) 12/31/2019   HCT 26.0 (L) 12/31/2019   MCV 90.3 12/31/2019   PLT 326 12/31/2019   Lab Results  Component Value Date   ALT 17 11/09/2019   AST 32 11/09/2019   ALKPHOS 37 (L) 11/09/2019   BILITOT 0.6 11/09/2019    Review of Systems  Constitutional: Positive for fatigue. Negative for chills, diaphoresis and fever.  HENT: Negative for trouble swallowing.   Respiratory: Negative for cough, chest tightness, shortness of breath and wheezing.   Cardiovascular: Negative for chest pain, palpitations and leg swelling.  Genitourinary: Negative for hematuria.  Neurological: Negative for dizziness, light-headedness and headaches.  Psychiatric/Behavioral: Negative for dysphoric mood and sleep disturbance. The patient is not nervous/anxious.     Patient Active Problem List   Diagnosis Date Noted  . Anemia 12/29/2019  . CKD (chronic kidney disease) stage V requiring chronic dialysis ([place]) 11/09/2019  . Postural dizziness with presyncope 11/09/2019  . Secondary hyperparathyroidism of renal origin ([place]) 04/24/2019  . Hyperkalemia 04/24/2019  . Localized edema 01/19/2019  . Chronic gout of multiple sites 08/16/2018  . Proteinuria due to type 2 diabetes mellitus ([place]) 08/22/2017  . Prostate cancer ([place]) 08/23/2015  . Type II diabetes mellitus with complication ([place]) 50/56/9794  . Hyperlipidemia associated with type 2 diabetes mellitus ([place]) 07/16/2015  . Essential (primary) hypertension 12/04/2014  . Morbid obesity, unspecified obesity type ([place])  12/04/2014    Allergies  Allergen Reactions  . Glimepiride Other (See Comments)    Severe hypoglycemia  . Percocet [Oxycodone-Acetaminophen] Nausea And Vomiting    Past Surgical History:  Procedure Laterality Date  . PARTIAL COLECTOMY  2005  . RADIOACTIVE SEED IMPLANT N/A 12/06/2017    Procedure: RADIOACTIVE SEED IMPLANT/BRACHYTHERAPY IMPLANT;  Surgeon: Hollice Espy, MD;  Location: [facility] ORS;  Service: Urology;  Laterality: N/A;    Social History   Tobacco Use  . Smoking status: Never Smoker  . Smokeless tobacco: Never Used  Substance Use Topics  . Alcohol use: No    Alcohol/week: 0.0 standard drinks  . Drug use: No     Medication list has been reviewed and updated.  Current Meds  Medication Sig  . amLODipine (NORVASC) 10 MG tablet TAKE 1 TABLET BY MOUTH EVERY DAY  . atorvastatin (LIPITOR) 10 MG tablet TAKE 1 TABLET BY MOUTH EVERY DAY WITH BREAKFAST (Patient taking differently: Take 10 mg by mouth daily. )  . calcitRIOL (ROCALTROL) 0.25 MCG capsule Take 1 capsule (0.25 mcg total) by mouth daily.  . carvedilol (COREG) 6.25 MG tablet Take 6.25 mg by mouth 2 (two) times daily with a meal.   . cloNIDine (CATAPRES - DOSED IN MG/24 HR) 0.2 mg/24hr patch Place 1 patch (0.2 mg total) onto the skin once a week.  . hydrALAZINE (APRESOLINE) 100 MG tablet Take 100 mg by mouth 3 (three) times daily.  . sevelamer carbonate (RENVELA) 800 MG tablet Take 1 tablet (800 mg total) by mouth 2 (two) times daily with a meal.  . sodium bicarbonate 650 MG tablet Take 1 tablet (650 mg total) by mouth 3 (three) times daily.  . traMADol (ULTRAM) 50 MG tablet Take 1 tablet (50 mg total) by mouth every 8 (eight) hours as needed.    PHQ 2/9 Scores 01/09/2020 11/27/2019 08/21/2019 01/19/2019  PHQ - 2 Score 0 0 0 0  PHQ- 9 Score 0 2 0 -    BP Readings from Last 3 Encounters:  01/09/20 (!) 148/86  01/01/20 (!) 158/89  11/27/19 (!) 134/94    Physical Exam Constitutional:      Appearance: He is obese.  Cardiovascular:     Rate and Rhythm: Normal rate and regular rhythm.     Pulses: Normal pulses.     Heart sounds: No murmur.  Pulmonary:     Effort: Pulmonary effort is normal.     Breath sounds: Normal breath sounds. No wheezing or rhonchi.  Musculoskeletal:     Cervical back: Normal  range of motion.     Right lower leg: No edema.     Left lower leg: No edema.  Lymphadenopathy:     Cervical: No cervical adenopathy.  Skin:    Capillary Refill: Capillary refill takes less than 2 seconds.          Comments: Warm pink induration c/w superficial phlebitis  Neurological:     General: No focal deficit present.     Mental Status: He is alert.     Wt Readings from Last 3 Encounters:  01/09/20 (!) 320 lb (145.2 kg)  12/30/19 (!) 334 lb 7 oz (151.7 kg)  11/27/19 (!) 337 lb (152.9 kg)    BP (!) 148/86   Pulse 94   Temp 99.6 F (37.6 C) (Oral)   Ht 6' (1.829 m)   Wt (!) 320 lb (145.2 kg)   SpO2 96%   BMI 43.40 kg/m   Assessment and Plan: 1. Type II diabetes  mellitus with complication ([place]) Now off all medications He needs to begin testing FSBS fasting several times per week I anticipate that he will not need any medication, esp once he starts HD  2. Essential (primary) hypertension Fair control of BP today Olmesartan stopped due to CKD  3. CKD (chronic kidney disease) stage V requiring chronic dialysis ([place]) Begin referred to [facility] for graft placement Nephrology is following and closely monitoring  4. Phlebitis after infusion, initial encounter Recommend warm compresses No nsaids   Partially dictated using Editor, commissioning. Any errors are unintentional.  Halina Maidens, MD [facility]  01/09/2020

## 2020-01-09 NOTE — Patient Instructions (Signed)
Start checking your blood sugars three times a week in the morning before eating.  Write the values down with the date, the time and the value.

## 2020-01-11 ENCOUNTER — Encounter (INDEPENDENT_AMBULATORY_CARE_PROVIDER_SITE_OTHER): Payer: Self-pay

## 2020-01-11 NOTE — Telephone Encounter (Signed)
Patient called back and is now scheduled with Dr. Lucky Cowboy for a permcath insertion on 01/17/20 with a 6:45 am arrival time to the [facility]. Patient will do covid testing on 01/12/20 between 8-1 pm at he [facility]. Pre-procedure instructions were discussed and will be mailed.

## 2020-01-15 ENCOUNTER — Other Ambulatory Visit
Admission: RE | Admit: 2020-01-15 | Discharge: 2020-01-15 | Disposition: A | Discharge status: Home / Self Care | Payer: 59 | Source: Ambulatory Visit | Attending: Vascular Surgery | Admitting: Vascular Surgery

## 2020-01-15 ENCOUNTER — Other Ambulatory Visit: Payer: Self-pay

## 2020-01-15 DIAGNOSIS — Z20822 Contact with and (suspected) exposure to covid-19: Secondary | ICD-10-CM | POA: Insufficient documentation

## 2020-01-15 DIAGNOSIS — Z01812 Encounter for preprocedural laboratory examination: Secondary | ICD-10-CM | POA: Diagnosis not present

## 2020-01-15 LAB — SARS CORONAVIRUS 2 (TAT 6-24 HRS): SARS Coronavirus 2: NEGATIVE

## 2020-01-16 ENCOUNTER — Other Ambulatory Visit (INDEPENDENT_AMBULATORY_CARE_PROVIDER_SITE_OTHER): Payer: Self-pay | Admitting: Nurse Practitioner

## 2020-01-17 ENCOUNTER — Encounter
Admission: RE | Disposition: A | Discharge status: Home / Self Care | Payer: Self-pay | Source: Home / Self Care | Attending: Vascular Surgery

## 2020-01-17 ENCOUNTER — Other Ambulatory Visit: Payer: Self-pay

## 2020-01-17 ENCOUNTER — Ambulatory Visit
Admission: RE | Admit: 2020-01-17 | Discharge: 2020-01-17 | Disposition: A | Discharge status: Home / Self Care | Payer: 59 | Attending: Vascular Surgery | Admitting: Vascular Surgery

## 2020-01-17 ENCOUNTER — Encounter: Payer: Self-pay | Admitting: Vascular Surgery

## 2020-01-17 DIAGNOSIS — N185 Chronic kidney disease, stage 5: Secondary | ICD-10-CM

## 2020-01-17 DIAGNOSIS — E1122 Type 2 diabetes mellitus with diabetic chronic kidney disease: Secondary | ICD-10-CM | POA: Diagnosis not present

## 2020-01-17 DIAGNOSIS — Z6841 Body Mass Index (BMI) 40.0 and over, adult: Secondary | ICD-10-CM | POA: Insufficient documentation

## 2020-01-17 DIAGNOSIS — N2581 Secondary hyperparathyroidism of renal origin: Secondary | ICD-10-CM | POA: Diagnosis not present

## 2020-01-17 DIAGNOSIS — D649 Anemia, unspecified: Secondary | ICD-10-CM | POA: Diagnosis not present

## 2020-01-17 DIAGNOSIS — E785 Hyperlipidemia, unspecified: Secondary | ICD-10-CM | POA: Insufficient documentation

## 2020-01-17 DIAGNOSIS — N186 End stage renal disease: Secondary | ICD-10-CM | POA: Diagnosis not present

## 2020-01-17 DIAGNOSIS — M109 Gout, unspecified: Secondary | ICD-10-CM | POA: Insufficient documentation

## 2020-01-17 DIAGNOSIS — I120 Hypertensive chronic kidney disease with stage 5 chronic kidney disease or end stage renal disease: Secondary | ICD-10-CM | POA: Insufficient documentation

## 2020-01-17 DIAGNOSIS — Z8546 Personal history of malignant neoplasm of prostate: Secondary | ICD-10-CM | POA: Insufficient documentation

## 2020-01-17 DIAGNOSIS — N189 Chronic kidney disease, unspecified: Secondary | ICD-10-CM

## 2020-01-17 HISTORY — PX: DIALYSIS/PERMA CATHETER INSERTION: CATH118288

## 2020-01-17 LAB — POTASSIUM (ARMC VASCULAR LAB ONLY): Potassium (ARMC vascular lab): 3.6 (ref 3.5–5.1)

## 2020-01-17 LAB — GLUCOSE, CAPILLARY: Glucose-Capillary: 117 mg/dL — ABNORMAL HIGH (ref 70–99)

## 2020-01-17 SURGERY — DIALYSIS/PERMA CATHETER INSERTION
Anesthesia: Moderate Sedation

## 2020-01-17 MED ORDER — METHYLPREDNISOLONE SODIUM SUCC 125 MG IJ SOLR: 125.0000 mg | Freq: Once | INTRAMUSCULAR | Status: DC | PRN

## 2020-01-17 MED ORDER — MIDAZOLAM HCL 2 MG/2ML IJ SOLN
INTRAMUSCULAR | Status: DC | PRN
  Administered 2020-01-17: 2 mg via INTRAVENOUS

## 2020-01-17 MED ORDER — HYDROMORPHONE HCL 1 MG/ML IJ SOLN: 1.0000 mg | Freq: Once | INTRAMUSCULAR | Status: DC | PRN

## 2020-01-17 MED ORDER — SODIUM CHLORIDE 0.9 % IV SOLN: INTRAVENOUS | Status: DC

## 2020-01-17 MED ORDER — MIDAZOLAM HCL 5 MG/5ML IJ SOLN
INTRAMUSCULAR | Status: AC
  Filled 2020-01-17: qty 5

## 2020-01-17 MED ORDER — FENTANYL CITRATE (PF) 100 MCG/2ML IJ SOLN
INTRAMUSCULAR | Status: DC | PRN
  Administered 2020-01-17: 50 ug via INTRAVENOUS

## 2020-01-17 MED ORDER — MIDAZOLAM HCL 2 MG/ML PO SYRP: 8.0000 mg | ORAL_SOLUTION | Freq: Once | ORAL | Status: DC | PRN

## 2020-01-17 MED ORDER — FAMOTIDINE 20 MG PO TABS: 40.0000 mg | ORAL_TABLET | Freq: Once | ORAL | Status: DC | PRN

## 2020-01-17 MED ORDER — CEFAZOLIN SODIUM-DEXTROSE 2-4 GM/100ML-% IV SOLN: 2.0000 g | Freq: Once | INTRAVENOUS | Status: AC

## 2020-01-17 MED ORDER — FENTANYL CITRATE (PF) 100 MCG/2ML IJ SOLN
INTRAMUSCULAR | Status: AC
  Filled 2020-01-17: qty 2

## 2020-01-17 MED ORDER — HEPARIN SODIUM (PORCINE) 5000 UNIT/ML IJ SOLN
INTRAMUSCULAR | Status: AC
  Filled 2020-01-17: qty 2

## 2020-01-17 MED ORDER — CEFAZOLIN SODIUM-DEXTROSE 2-4 GM/100ML-% IV SOLN
INTRAVENOUS | Status: AC
  Administered 2020-01-17: 2 g via INTRAVENOUS
  Filled 2020-01-17: qty 100

## 2020-01-17 MED ORDER — ONDANSETRON HCL 4 MG/2ML IJ SOLN: 4.0000 mg | Freq: Four times a day (QID) | INTRAMUSCULAR | Status: DC | PRN

## 2020-01-17 MED ORDER — DIPHENHYDRAMINE HCL 50 MG/ML IJ SOLN: 50.0000 mg | Freq: Once | INTRAMUSCULAR | Status: DC | PRN

## 2020-01-17 SURGICAL SUPPLY — 7 items
CANNULA 5F STIFF (CANNULA) ×2 IMPLANT
CATH CANNON HEMO 15FR 19 (HEMODIALYSIS SUPPLIES) ×2 IMPLANT
DERMABOND ADVANCED (GAUZE/BANDAGES/DRESSINGS) ×1
DERMABOND ADVANCED .7 DNX12 (GAUZE/BANDAGES/DRESSINGS) ×1 IMPLANT
PACK ANGIOGRAPHY (CUSTOM PROCEDURE TRAY) ×2 IMPLANT
SUT MNCRL AB 4-0 PS2 18 (SUTURE) ×2 IMPLANT
SUT SILK 0 FSL (SUTURE) ×2 IMPLANT

## 2020-01-17 NOTE — Op Note (Signed)
OPERATIVE NOTE    PRE-OPERATIVE DIAGNOSIS: 1. ESRD   POST-OPERATIVE DIAGNOSIS: same as above  PROCEDURE: 1. Ultrasound guidance for vascular access to the right internal jugular vein 2. Fluoroscopic guidance for placement of catheter 3. Placement of a 19 cm tip to cuff tunneled hemodialysis catheter via the right internal jugular vein  SURGEON: [name], MD  ANESTHESIA:  Local with Moderate conscious sedation for approximately 15 minutes using 2 mg of Versed and 50 mcg of Fentanyl  ESTIMATED BLOOD LOSS: 5 cc  FLUORO TIME: less than one minute  CONTRAST: none  FINDING(S): 1.  Patent right internal jugular vein  SPECIMEN(S):  None  INDICATIONS:   Benjamin Stewart is a 44 y.o.male who presents with renal failure.  The patient needs long term dialysis access for their ESRD, and a Permcath is necessary.  Risks and benefits are discussed and informed consent is obtained.    DESCRIPTION: After obtaining full informed written consent, the patient was brought back to the vascular suited. The patient's right neck and chest were sterilely prepped and draped in a sterile surgical field was created. Moderate conscious sedation was administered during a face to face encounter with the patient throughout the procedure with my supervision of the RN administering medicines and monitoring the patient's vital signs, pulse oximetry, telemetry and mental status throughout from the start of the procedure until the patient was taken to the recovery room.  The right internal jugular vein was visualized with ultrasound and found to be patent. It was then accessed under direct ultrasound guidance and a permanent image was recorded. A wire was placed. After skin nick and dilatation, the peel-away sheath was placed over the wire. I then turned my attention to an area under the clavicle. Approximately 1-2 fingerbreadths below the clavicle a small counterincision was created and tunneled from the subclavicular  incision to the access site. Using fluoroscopic guidance, a 19 centimeter tip to cuff tunneled hemodialysis catheter was selected, and tunneled from the subclavicular incision to the access site. It was then placed through the peel-away sheath and the peel-away sheath was removed. Using fluoroscopic guidance the catheter tips were parked in the right atrium. The appropriate distal connectors were placed. It withdrew blood well and flushed easily with heparinized saline and a concentrated heparin solution was then placed. It was secured to the chest wall with 2 Prolene sutures. The access incision was closed single 4-0 Monocryl. A 4-0 Monocryl pursestring suture was placed around the exit site. Sterile dressings were placed. The patient tolerated the procedure well and was taken to the recovery room in stable condition.  COMPLICATIONS: None  CONDITION: Stable  [name], MD 01/17/2020 8:42 AM   This note was created with Dragon Medical transcription system. Any errors in dictation are purely unintentional.

## 2020-01-17 NOTE — H&P (Signed)
[facility] History & Physical Update  The patient was interviewed and re-examined.  The patient's previous History and Physical has been reviewed and is unchanged.  There is no change in the plan of care. We plan to proceed with the scheduled procedure.  [name], MD  01/17/2020, 8:05 AM

## 2020-01-19 ENCOUNTER — Other Ambulatory Visit: Payer: Self-pay | Admitting: Internal Medicine

## 2020-01-19 DIAGNOSIS — I10 Essential (primary) hypertension: Secondary | ICD-10-CM

## 2020-02-07 ENCOUNTER — Other Ambulatory Visit (INDEPENDENT_AMBULATORY_CARE_PROVIDER_SITE_OTHER): Payer: Self-pay | Admitting: Vascular Surgery

## 2020-02-07 DIAGNOSIS — N186 End stage renal disease: Secondary | ICD-10-CM

## 2020-02-08 ENCOUNTER — Other Ambulatory Visit: Payer: Self-pay

## 2020-02-08 ENCOUNTER — Ambulatory Visit (INDEPENDENT_AMBULATORY_CARE_PROVIDER_SITE_OTHER): Payer: 59

## 2020-02-08 ENCOUNTER — Other Ambulatory Visit (INDEPENDENT_AMBULATORY_CARE_PROVIDER_SITE_OTHER): Payer: 59

## 2020-02-08 ENCOUNTER — Ambulatory Visit (INDEPENDENT_AMBULATORY_CARE_PROVIDER_SITE_OTHER): Payer: Self-pay | Admitting: Nurse Practitioner

## 2020-02-08 DIAGNOSIS — N186 End stage renal disease: Secondary | ICD-10-CM | POA: Diagnosis not present

## 2020-02-20 ENCOUNTER — Ambulatory Visit: Payer: 59 | Admitting: Internal Medicine

## 2020-02-20 ENCOUNTER — Ambulatory Visit (INDEPENDENT_AMBULATORY_CARE_PROVIDER_SITE_OTHER): Payer: 59 | Admitting: Nurse Practitioner

## 2020-02-20 ENCOUNTER — Other Ambulatory Visit: Payer: Self-pay

## 2020-02-20 ENCOUNTER — Encounter (INDEPENDENT_AMBULATORY_CARE_PROVIDER_SITE_OTHER): Payer: Self-pay | Admitting: Nurse Practitioner

## 2020-02-20 VITALS — BP 161/99 | HR 92 | Ht 72.0 in | Wt 289.0 lb

## 2020-02-20 DIAGNOSIS — Z992 Dependence on renal dialysis: Secondary | ICD-10-CM

## 2020-02-20 DIAGNOSIS — E1169 Type 2 diabetes mellitus with other specified complication: Secondary | ICD-10-CM

## 2020-02-20 DIAGNOSIS — E118 Type 2 diabetes mellitus with unspecified complications: Secondary | ICD-10-CM | POA: Diagnosis not present

## 2020-02-20 DIAGNOSIS — E785 Hyperlipidemia, unspecified: Secondary | ICD-10-CM

## 2020-02-20 DIAGNOSIS — I10 Essential (primary) hypertension: Secondary | ICD-10-CM | POA: Diagnosis not present

## 2020-02-20 DIAGNOSIS — N186 End stage renal disease: Secondary | ICD-10-CM

## 2020-02-21 NOTE — H&P (View-Only) (Signed)
Subjective:    Patient ID: Benjamin Stewart, male    DOB: 11/21/1975, 44 y.o.   MRN: [id number] Chief Complaint  Patient presents with  . Follow-up    3 wk follow [facility] F/U vein mapping     The patient is seen for evaluation for dialysis access.  The patient recently had a PermCath placed on 01/17/2020 due to end-stage renal disease and needing to begin dialysis on an immediate basis.  The patient has been going to dialysis for approximately the last month and has been doing well with dialysis treatments.    The patient has been considering the various methods of dialysis and wishes to proceed with hemodialysis and therefore creation of AV access.  The patient denies amaurosis fugax or recent TIA symptoms. There are no recent neurological changes noted. The patient denies claudication symptoms or rest pain symptoms. The patient denies recent episodes of angina or shortness of breath.   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  The left lower extremity shows borderline adequate veins for creation of a brachiocephalic AV fistula.   Review of Systems  Constitutional: Positive for fatigue.  All other systems reviewed and are negative.      Objective:   Physical Exam Vitals reviewed.  HENT:     Head: Normocephalic.  Cardiovascular:     Rate and Rhythm: Normal rate and regular rhythm.     Pulses: Normal pulses.     Arteriovenous access: right arteriovenous access is present.    Comments: Right chest PermCath Pulmonary:     Effort: Pulmonary effort is normal.  Neurological:     Mental Status: He is alert and oriented to person, place, and time.  Psychiatric:        Mood and Affect: Mood normal.        Behavior: Behavior normal.        Thought Content: Thought content normal.        Judgment: Judgment normal.     BP (!) 161/99   Pulse 92   Ht 6' (1.829 m)   Wt 289 lb (131.1 kg)   BMI 39.20 kg/m   Past  Medical History:  Diagnosis Date  . Benign essential hematuria 12/04/2014   microscopic hematuria evaluation with CT and cysto - treated for 2 months with bactrim DS; no further f/u planned unless hematuria recurs.   . Controlled type 2 diabetes mellitus without complication, without long-term current use of insulin ([place]) 07/16/2015  . COVID-19 virus infection 12/29/2019  . Essential (primary) hypertension 12/04/2014  . History of kidney stones   . prostate cancer   . Sleep apnea     Social History   Socioeconomic History  . Marital status: Divorced    Spouse name: Not on file  . Number of children: Not on file  . Years of education: Not on file  . Highest education level: Not on file  Occupational History  . Not on file  Tobacco Use  . Smoking status: Never Smoker  . Smokeless tobacco: Never Used  Vaping Use  . Vaping Use: Never used  Substance and Sexual Activity  . Alcohol use: No    Alcohol/week: 0.0 standard drinks  . Drug use: No  . Sexual activity: Not on file  Other Topics Concern  . Not on file  Social History Narrative  . Not on file   Social Determinants of Health   Financial Resource Strain:   .  Difficulty of Paying Living Expenses:   Food Insecurity:   . Worried About Charity fundraiser in the Last Year:   . Arboriculturist in the Last Year:   Transportation Needs:   . Film/video editor (Medical):   Marland Kitchen Lack of Transportation (Non-Medical):   Physical Activity:   . Days of Exercise per Week:   . Minutes of Exercise per Session:   Stress:   . Feeling of Stress :   Social Connections:   . Frequency of Communication with Friends and Family:   . Frequency of Social Gatherings with Friends and Family:   . Attends Religious Services:   . Active Member of Clubs or Organizations:   . Attends Archivist Meetings:   Marland Kitchen Marital Status:   Intimate Partner Violence:   . Fear of Current or Ex-Partner:   . Emotionally Abused:   Marland Kitchen Physically Abused:    . Sexually Abused:     Past Surgical History:  Procedure Laterality Date  . DIALYSIS/PERMA CATHETER INSERTION N/A 01/17/2020   Procedure: DIALYSIS/PERMA CATHETER INSERTION;  Surgeon: Algernon Huxley, MD;  Location: [facility];  Service: Cardiovascular;  Laterality: N/A;  . PARTIAL COLECTOMY  2005  . RADIOACTIVE SEED IMPLANT N/A 12/06/2017   Procedure: RADIOACTIVE SEED IMPLANT/BRACHYTHERAPY IMPLANT;  Surgeon: Hollice Espy, MD;  Location: [facility];  Service: Urology;  Laterality: N/A;    Family History  Problem Relation Age of Onset  . Diabetes Mother   . Heart disease Mother   . Hypertension Mother   . Glaucoma Mother   . Prostate cancer Father   . Diabetes Maternal Grandmother   . Diabetes Maternal Grandfather   . Prostate cancer Maternal Uncle     Allergies  Allergen Reactions  . Glimepiride Other (See Comments)    Severe hypoglycemia  . Percocet [Oxycodone-Acetaminophen] Nausea And Vomiting       Assessment & Plan:   1. CKD (chronic kidney disease) stage V requiring chronic dialysis ([place]) Recommend:  At this time the patient does not have appropriate extremity access for dialysis  Patient should have a brachiocephalic AV fistula created.  Based on patient's noninvasive studies, the vein sizes are somewhat borderline, therefore if brachiocephalic fistula creation is found to be an adequate inadequate, a graft will be utilized in its place.  The risks, benefits and alternative therapies were reviewed in detail with the patient.  All questions were answered.  The patient agrees to proceed with surgery.    2. Essential (primary) hypertension Continue antihypertensive medications as already ordered, these medications have been reviewed and there are no changes at this time.   3. Type II diabetes mellitus with complication (HCC) Continue hypoglycemic medications as already ordered, these medications have been reviewed and there are no changes at this  time.  Hgb A1C to be monitored as already arranged by primary service   4. Hyperlipidemia associated with type 2 diabetes mellitus ([place]) Continue statin as ordered and reviewed, no changes at this time    Current Outpatient Medications on File Prior to Visit  Medication Sig Dispense Refill  . amLODipine (NORVASC) 10 MG tablet TAKE 1 TABLET BY MOUTH EVERY DAY 90 tablet 1  . atorvastatin (LIPITOR) 10 MG tablet TAKE 1 TABLET BY MOUTH EVERY DAY WITH BREAKFAST 90 tablet 3  . calcitRIOL (ROCALTROL) 0.25 MCG capsule Take 1 capsule (0.25 mcg total) by mouth daily. 90 capsule 1  . carvedilol (COREG) 6.25 MG tablet Take 6.25 mg by mouth  2 (two) times daily with a meal.     . cloNIDine (CATAPRES - DOSED IN MG/24 HR) 0.2 mg/24hr patch Place 1 patch (0.2 mg total) onto the skin once a week. 4 patch 6  . hydrALAZINE (APRESOLINE) 100 MG tablet Take 100 mg by mouth 3 (three) times daily.    . sevelamer carbonate (RENVELA) 800 MG tablet Take 1 tablet (800 mg total) by mouth 2 (two) times daily with a meal. 60 tablet 1  . sodium bicarbonate 650 MG tablet Take 1 tablet (650 mg total) by mouth 3 (three) times daily. 90 tablet 1  . traMADol (ULTRAM) 50 MG tablet Take 1 tablet (50 mg total) by mouth every 8 (eight) hours as needed. 15 tablet 0   No current facility-administered medications on file prior to visit.    There are no Patient Instructions on file for this visit. No follow-ups on file.   Kris Hartmann, NP

## 2020-02-21 NOTE — Progress Notes (Signed)
Subjective:    Patient ID: Benjamin Stewart, male    DOB: October 15, 1975, 44 y.o.   MRN: [id number] Chief Complaint  Patient presents with  . Follow-up    3 wk follow [facility] F/U vein mapping     The patient is seen for evaluation for dialysis access.  The patient recently had a PermCath placed on 01/17/2020 due to end-stage renal disease and needing to begin dialysis on an immediate basis.  The patient has been going to dialysis for approximately the last month and has been doing well with dialysis treatments.    The patient has been considering the various methods of dialysis and wishes to proceed with hemodialysis and therefore creation of AV access.  The patient denies amaurosis fugax or recent TIA symptoms. There are no recent neurological changes noted. The patient denies claudication symptoms or rest pain symptoms. The patient denies recent episodes of angina or shortness of breath.   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  The left lower extremity shows borderline adequate veins for creation of a brachiocephalic AV fistula.   Review of Systems  Constitutional: Positive for fatigue.  All other systems reviewed and are negative.      Objective:   Physical Exam Vitals reviewed.  HENT:     Head: Normocephalic.  Cardiovascular:     Rate and Rhythm: Normal rate and regular rhythm.     Pulses: Normal pulses.     Arteriovenous access: right arteriovenous access is present.    Comments: Right chest PermCath Pulmonary:     Effort: Pulmonary effort is normal.  Neurological:     Mental Status: He is alert and oriented to person, place, and time.  Psychiatric:        Mood and Affect: Mood normal.        Behavior: Behavior normal.        Thought Content: Thought content normal.        Judgment: Judgment normal.     BP (!) 161/99   Pulse 92   Ht 6' (1.829 m)   Wt 289 lb (131.1 kg)   BMI 39.20 kg/m   Past  Medical History:  Diagnosis Date  . Benign essential hematuria 12/04/2014   microscopic hematuria evaluation with CT and cysto - treated for 2 months with bactrim DS; no further f/u planned unless hematuria recurs.   . Controlled type 2 diabetes mellitus without complication, without long-term current use of insulin ([place]) 07/16/2015  . COVID-19 virus infection 12/29/2019  . Essential (primary) hypertension 12/04/2014  . History of kidney stones   . prostate cancer   . Sleep apnea     Social History   Socioeconomic History  . Marital status: Divorced    Spouse name: Not on file  . Number of children: Not on file  . Years of education: Not on file  . Highest education level: Not on file  Occupational History  . Not on file  Tobacco Use  . Smoking status: Never Smoker  . Smokeless tobacco: Never Used  Vaping Use  . Vaping Use: Never used  Substance and Sexual Activity  . Alcohol use: No    Alcohol/week: 0.0 standard drinks  . Drug use: No  . Sexual activity: Not on file  Other Topics Concern  . Not on file  Social History Narrative  . Not on file   Social Determinants of Health   Financial Resource Strain:   .  Difficulty of Paying Living Expenses:   Food Insecurity:   . Worried About Charity fundraiser in the Last Year:   . Arboriculturist in the Last Year:   Transportation Needs:   . Film/video editor (Medical):   Marland Kitchen Lack of Transportation (Non-Medical):   Physical Activity:   . Days of Exercise per Week:   . Minutes of Exercise per Session:   Stress:   . Feeling of Stress :   Social Connections:   . Frequency of Communication with Friends and Family:   . Frequency of Social Gatherings with Friends and Family:   . Attends Religious Services:   . Active Member of Clubs or Organizations:   . Attends Archivist Meetings:   Marland Kitchen Marital Status:   Intimate Partner Violence:   . Fear of Current or Ex-Partner:   . Emotionally Abused:   Marland Kitchen Physically Abused:    . Sexually Abused:     Past Surgical History:  Procedure Laterality Date  . DIALYSIS/PERMA CATHETER INSERTION N/A 01/17/2020   Procedure: DIALYSIS/PERMA CATHETER INSERTION;  Surgeon: Algernon Huxley, MD;  Location: [facility];  Service: Cardiovascular;  Laterality: N/A;  . PARTIAL COLECTOMY  2005  . RADIOACTIVE SEED IMPLANT N/A 12/06/2017   Procedure: RADIOACTIVE SEED IMPLANT/BRACHYTHERAPY IMPLANT;  Surgeon: Hollice Espy, MD;  Location: [facility];  Service: Urology;  Laterality: N/A;    Family History  Problem Relation Age of Onset  . Diabetes Mother   . Heart disease Mother   . Hypertension Mother   . Glaucoma Mother   . Prostate cancer Father   . Diabetes Maternal Grandmother   . Diabetes Maternal Grandfather   . Prostate cancer Maternal Uncle     Allergies  Allergen Reactions  . Glimepiride Other (See Comments)    Severe hypoglycemia  . Percocet [Oxycodone-Acetaminophen] Nausea And Vomiting       Assessment & Plan:   1. CKD (chronic kidney disease) stage V requiring chronic dialysis ([place]) Recommend:  At this time the patient does not have appropriate extremity access for dialysis  Patient should have a brachiocephalic AV fistula created.  Based on patient's noninvasive studies, the vein sizes are somewhat borderline, therefore if brachiocephalic fistula creation is found to be an adequate inadequate, a graft will be utilized in its place.  The risks, benefits and alternative therapies were reviewed in detail with the patient.  All questions were answered.  The patient agrees to proceed with surgery.    2. Essential (primary) hypertension Continue antihypertensive medications as already ordered, these medications have been reviewed and there are no changes at this time.   3. Type II diabetes mellitus with complication (HCC) Continue hypoglycemic medications as already ordered, these medications have been reviewed and there are no changes at this  time.  Hgb A1C to be monitored as already arranged by primary service   4. Hyperlipidemia associated with type 2 diabetes mellitus ([place]) Continue statin as ordered and reviewed, no changes at this time    Current Outpatient Medications on File Prior to Visit  Medication Sig Dispense Refill  . amLODipine (NORVASC) 10 MG tablet TAKE 1 TABLET BY MOUTH EVERY DAY 90 tablet 1  . atorvastatin (LIPITOR) 10 MG tablet TAKE 1 TABLET BY MOUTH EVERY DAY WITH BREAKFAST 90 tablet 3  . calcitRIOL (ROCALTROL) 0.25 MCG capsule Take 1 capsule (0.25 mcg total) by mouth daily. 90 capsule 1  . carvedilol (COREG) 6.25 MG tablet Take 6.25 mg by mouth  2 (two) times daily with a meal.     . cloNIDine (CATAPRES - DOSED IN MG/24 HR) 0.2 mg/24hr patch Place 1 patch (0.2 mg total) onto the skin once a week. 4 patch 6  . hydrALAZINE (APRESOLINE) 100 MG tablet Take 100 mg by mouth 3 (three) times daily.    . sevelamer carbonate (RENVELA) 800 MG tablet Take 1 tablet (800 mg total) by mouth 2 (two) times daily with a meal. 60 tablet 1  . sodium bicarbonate 650 MG tablet Take 1 tablet (650 mg total) by mouth 3 (three) times daily. 90 tablet 1  . traMADol (ULTRAM) 50 MG tablet Take 1 tablet (50 mg total) by mouth every 8 (eight) hours as needed. 15 tablet 0   No current facility-administered medications on file prior to visit.    There are no Patient Instructions on file for this visit. No follow-ups on file.   Kris Hartmann, NP

## 2020-02-29 ENCOUNTER — Telehealth (INDEPENDENT_AMBULATORY_CARE_PROVIDER_SITE_OTHER): Payer: Self-pay

## 2020-02-29 NOTE — Telephone Encounter (Signed)
I attempted to contact the patient and a message was left for a return call on the patient's home and mobile number. 

## 2020-03-04 NOTE — Telephone Encounter (Signed)
Patient called back and all information regarding his surgery was discussed and will be faxed to the patient's dialysis center in [place] per patient request.

## 2020-03-04 NOTE — Telephone Encounter (Signed)
I attempted to contact the patient regarding his upcoming surgery with Dr. Lucky Cowboy on 03/07/20. Patient scheduled for a Left AVF poss AVG and will do his covid testing 03/06/20 between 8-1 pm at the [facility] and pre-op phone call between 8-1 on 03/05/20.

## 2020-03-05 ENCOUNTER — Other Ambulatory Visit: Admission: RE | Admit: 2020-03-05 | Payer: 59 | Source: Ambulatory Visit

## 2020-03-05 ENCOUNTER — Other Ambulatory Visit (INDEPENDENT_AMBULATORY_CARE_PROVIDER_SITE_OTHER): Payer: Self-pay | Admitting: Nurse Practitioner

## 2020-03-06 ENCOUNTER — Other Ambulatory Visit: Payer: Self-pay

## 2020-03-06 ENCOUNTER — Encounter
Admission: RE | Admit: 2020-03-06 | Discharge: 2020-03-06 | Disposition: A | Discharge status: Home / Self Care | Payer: 59 | Source: Ambulatory Visit | Attending: Vascular Surgery | Admitting: Vascular Surgery

## 2020-03-06 DIAGNOSIS — Z20822 Contact with and (suspected) exposure to covid-19: Secondary | ICD-10-CM | POA: Insufficient documentation

## 2020-03-06 DIAGNOSIS — Z01812 Encounter for preprocedural laboratory examination: Secondary | ICD-10-CM | POA: Insufficient documentation

## 2020-03-06 HISTORY — DX: Anemia, unspecified: D64.9

## 2020-03-06 HISTORY — DX: Chronic kidney disease, unspecified: N18.9

## 2020-03-06 LAB — APTT: aPTT: 38 seconds — ABNORMAL HIGH (ref 24–36)

## 2020-03-06 LAB — BASIC METABOLIC PANEL
Anion gap: 11 (ref 5–15)
BUN: 35 mg/dL — ABNORMAL HIGH (ref 6–20)
CO2: 25 mmol/L (ref 22–32)
Calcium: 9.3 mg/dL (ref 8.9–10.3)
Chloride: 100 mmol/L (ref 98–111)
Creatinine, Ser: 7.91 mg/dL — ABNORMAL HIGH (ref 0.61–1.24)
GFR calc Af Amer: 9 mL/min — ABNORMAL LOW (ref >60)
GFR calc non Af Amer: 7 mL/min — ABNORMAL LOW (ref >60)
Glucose, Bld: 123 mg/dL — ABNORMAL HIGH (ref 70–99)
Potassium: 3.3 mmol/L — ABNORMAL LOW (ref 3.5–5.1)
Sodium: 136 mmol/L (ref 135–145)

## 2020-03-06 LAB — TYPE AND SCREEN
ABO/RH(D): O POS
Antibody Screen: NEGATIVE

## 2020-03-06 LAB — CBC WITH DIFFERENTIAL/PLATELET
Abs Immature Granulocytes: 0.02 10*3/uL (ref 0.00–0.07)
Basophils Absolute: 0 10*3/uL (ref 0.0–0.1)
Basophils Relative: 0 %
Eosinophils Absolute: 0.3 10*3/uL (ref 0.0–0.5)
Eosinophils Relative: 4 %
HCT: 26.5 % — ABNORMAL LOW (ref 39.0–52.0)
Hemoglobin: 8.2 g/dL — ABNORMAL LOW (ref 13.0–17.0)
Immature Granulocytes: 0 %
Lymphocytes Relative: 14 %
Lymphs Abs: 1.1 10*3/uL (ref 0.7–4.0)
MCH: 28 pg (ref 26.0–34.0)
MCHC: 30.9 g/dL (ref 30.0–36.0)
MCV: 90.4 fL (ref 80.0–100.0)
Monocytes Absolute: 0.7 10*3/uL (ref 0.1–1.0)
Monocytes Relative: 9 %
Neutro Abs: 5.7 10*3/uL (ref 1.7–7.7)
Neutrophils Relative %: 73 %
Platelets: 331 10*3/uL (ref 150–400)
RBC: 2.93 MIL/uL — ABNORMAL LOW (ref 4.22–5.81)
RDW: 13.8 % (ref 11.5–15.5)
WBC: 7.9 10*3/uL (ref 4.0–10.5)
nRBC: 0 % (ref 0.0–0.2)

## 2020-03-06 LAB — SARS CORONAVIRUS 2 (TAT 6-24 HRS): SARS Coronavirus 2: NEGATIVE

## 2020-03-06 LAB — PROTIME-INR
INR: 1.1 (ref 0.8–1.2)
Prothrombin Time: 13.9 seconds (ref 11.4–15.2)

## 2020-03-06 NOTE — Patient Instructions (Addendum)
  Your procedure is scheduled on: 03-07-20 THURSDAY Report to Same Day Surgery 2nd floor medical mall [facility] Entrance-take elevator on left to 2nd floor.  Check in with surgery information desk.) @ 33 AM   Remember: Instructions that are not followed completely may result in serious medical risk, up to and including death, or upon the discretion of your surgeon and anesthesiologist your surgery may need to be rescheduled.    _x___ 1. Do not eat food after midnight the night before your procedure. NO GUM OR CANDY AFTER MIDNIGHT. You may drink WATER up to 2 hours before you are scheduled to arrive at the hospital for your procedure.  Do not drink WATER within 2 hours of your scheduled arrival to the hospital.    __x__ 2. No Alcohol for 24 hours before or after surgery.   __x__3. No Smoking or e-cigarettes for 24 prior to surgery.  Do not use any chewable tobacco products for at least 6 hour prior to surgery   ____  4. Bring all medications with you on the day of surgery if instructed.    __x__ 5. Notify your doctor if there is any change in your medical condition     (cold, fever, infections).    x___6. On the morning of surgery brush your teeth with toothpaste and water.  You may rinse your mouth with mouth wash if you wish.  Do not swallow any toothpaste or mouthwash.   Do not wear jewelry, make-up, hairpins, clips or nail polish.  Do not wear lotions, powders, or perfumes.  Do not shave 48 hours prior to surgery. Men may shave face and neck.  Do not bring valuables to the hospital.    [facility] is not responsible for any belongings or valuables.               Contacts, dentures or bridgework may not be worn into surgery.  Leave your suitcase in the car. After surgery it may be brought to your room.  For patients admitted to the hospital, discharge time is determined by your treatment team.  _  Patients discharged the day of surgery will not be allowed to drive home.  You will  need someone to drive you home and stay with you the night of your procedure.    Please read over the following fact sheets that you were given:   [facility] Preparing for Surgery   _x___ TAKE THE FOLLOWING MEDICATION THE MORNING OF SURGERY WITH A SMALL SIP OF WATER. These include:  1. COREG (CARVEDILOL)  2. HYDRALAZINE (APRESOLINE)  3. LIPITOR (ATORVASTATIN)  4. NORVASC (AMLODIPINE)  5.  6.  ____Fleets enema or Magnesium Citrate as directed.   _x___ Use CHG Soap or sage wipes as directed on instruction sheet   ____ Use inhalers on the day of surgery and bring to hospital day of surgery  ____ Stop Metformin and Janumet 2 days prior to surgery.    ____ Take 1/2 of usual insulin dose the night before surgery and none on the morning surgery.   ____ Follow recommendations from Cardiologist, Pulmonologist or PCP regarding stopping Aspirin, Coumadin, Plavix ,Eliquis, Effient, or Pradaxa, and Pletal.  X____Stop Anti-inflammatories such as Advil, Aleve, Ibuprofen, Motrin, Naproxen, Naprosyn, Goodies powders or aspirin products NOW-OK to take Tylenol    ____ Stop supplements until after surgery.     ____ Bring C-Pap to the hospital.

## 2020-03-06 NOTE — Progress Notes (Signed)
  [facility] Perioperative Services: Pre-Admission/Anesthesia Testing  Abnormal Lab Notification    Date: 03/06/20  Name: OMARRI EICH MRN:   [id number]  Re: Abnormal labs noted during PAT appointment   Provider(s) Notified: Algernon Huxley, MD and Eulogio Ditch, NP Notification mode: Routed and/or faxed via [facility]   ABNORMAL LAB VALUE(S): Lab Results  Component Value Date   HGB 8.2 (L) 03/06/2020   HCT 26.5 (L) 03/06/2020   K 3.3 (L) 03/06/2020   BUN 35 (H) 03/06/2020   CREATININE 7.91 (H) 03/06/2020   Notes: Scheduled for AV fistula placement on 03/07/2020. T&S collected during PAT appointment.   Honor Loh, MSN, APRN, FNP-C, CEN [facility]  Peri-operative Services Nurse Practitioner Phone: [phone] 03/06/20 2:08 PM

## 2020-03-07 ENCOUNTER — Ambulatory Visit: Payer: 59 | Admitting: Urgent Care

## 2020-03-07 ENCOUNTER — Encounter
Admission: RE | Disposition: A | Discharge status: Home / Self Care | Payer: Self-pay | Source: Ambulatory Visit | Attending: Vascular Surgery

## 2020-03-07 ENCOUNTER — Ambulatory Visit
Admission: RE | Admit: 2020-03-07 | Discharge: 2020-03-07 | Disposition: A | Discharge status: Home / Self Care | Payer: 59 | Source: Ambulatory Visit | Attending: Vascular Surgery | Admitting: Vascular Surgery

## 2020-03-07 ENCOUNTER — Ambulatory Visit: Payer: 59 | Admitting: Anesthesiology

## 2020-03-07 ENCOUNTER — Encounter: Payer: Self-pay | Admitting: Vascular Surgery

## 2020-03-07 ENCOUNTER — Other Ambulatory Visit: Payer: Self-pay

## 2020-03-07 DIAGNOSIS — Z8616 Personal history of COVID-19: Secondary | ICD-10-CM | POA: Insufficient documentation

## 2020-03-07 DIAGNOSIS — N185 Chronic kidney disease, stage 5: Secondary | ICD-10-CM | POA: Diagnosis not present

## 2020-03-07 DIAGNOSIS — Z8249 Family history of ischemic heart disease and other diseases of the circulatory system: Secondary | ICD-10-CM | POA: Diagnosis not present

## 2020-03-07 DIAGNOSIS — E1169 Type 2 diabetes mellitus with other specified complication: Secondary | ICD-10-CM | POA: Insufficient documentation

## 2020-03-07 DIAGNOSIS — N186 End stage renal disease: Secondary | ICD-10-CM | POA: Insufficient documentation

## 2020-03-07 DIAGNOSIS — Z885 Allergy status to narcotic agent status: Secondary | ICD-10-CM | POA: Diagnosis not present

## 2020-03-07 DIAGNOSIS — G4730 Sleep apnea, unspecified: Secondary | ICD-10-CM | POA: Insufficient documentation

## 2020-03-07 DIAGNOSIS — E785 Hyperlipidemia, unspecified: Secondary | ICD-10-CM | POA: Diagnosis not present

## 2020-03-07 DIAGNOSIS — Z79899 Other long term (current) drug therapy: Secondary | ICD-10-CM | POA: Diagnosis not present

## 2020-03-07 DIAGNOSIS — Z8042 Family history of malignant neoplasm of prostate: Secondary | ICD-10-CM | POA: Diagnosis not present

## 2020-03-07 DIAGNOSIS — I120 Hypertensive chronic kidney disease with stage 5 chronic kidney disease or end stage renal disease: Secondary | ICD-10-CM | POA: Diagnosis not present

## 2020-03-07 DIAGNOSIS — Z833 Family history of diabetes mellitus: Secondary | ICD-10-CM | POA: Insufficient documentation

## 2020-03-07 DIAGNOSIS — E1122 Type 2 diabetes mellitus with diabetic chronic kidney disease: Secondary | ICD-10-CM | POA: Diagnosis not present

## 2020-03-07 DIAGNOSIS — Z8546 Personal history of malignant neoplasm of prostate: Secondary | ICD-10-CM | POA: Insufficient documentation

## 2020-03-07 DIAGNOSIS — Z9049 Acquired absence of other specified parts of digestive tract: Secondary | ICD-10-CM | POA: Insufficient documentation

## 2020-03-07 DIAGNOSIS — Z888 Allergy status to other drugs, medicaments and biological substances status: Secondary | ICD-10-CM | POA: Diagnosis not present

## 2020-03-07 DIAGNOSIS — Z992 Dependence on renal dialysis: Secondary | ICD-10-CM | POA: Diagnosis not present

## 2020-03-07 HISTORY — PX: AV FISTULA PLACEMENT: SHX1204

## 2020-03-07 LAB — POCT I-STAT, CHEM 8
BUN: 21 mg/dL — ABNORMAL HIGH (ref 6–20)
Calcium, Ion: 1.21 mmol/L (ref 1.15–1.40)
Chloride: 97 mmol/L — ABNORMAL LOW (ref 98–111)
Creatinine, Ser: 6.9 mg/dL — ABNORMAL HIGH (ref 0.61–1.24)
Glucose, Bld: 120 mg/dL — ABNORMAL HIGH (ref 70–99)
HCT: 24 % — ABNORMAL LOW (ref 39.0–52.0)
Hemoglobin: 8.2 g/dL — ABNORMAL LOW (ref 13.0–17.0)
Potassium: 3.4 mmol/L — ABNORMAL LOW (ref 3.5–5.1)
Sodium: 139 mmol/L (ref 135–145)
TCO2: 29 mmol/L (ref 22–32)

## 2020-03-07 LAB — GLUCOSE, CAPILLARY: Glucose-Capillary: 109 mg/dL — ABNORMAL HIGH (ref 70–99)

## 2020-03-07 LAB — ABO/RH: ABO/RH(D): O POS

## 2020-03-07 SURGERY — ARTERIOVENOUS (AV) FISTULA CREATION
Anesthesia: General | Site: Arm Upper | Laterality: Left

## 2020-03-07 MED ORDER — CHLORHEXIDINE GLUCONATE CLOTH 2 % EX PADS: 6.0000 | MEDICATED_PAD | Freq: Once | CUTANEOUS | Status: DC

## 2020-03-07 MED ORDER — LIDOCAINE HCL (CARDIAC) PF 100 MG/5ML IV SOSY
PREFILLED_SYRINGE | INTRAVENOUS | Status: DC | PRN
  Administered 2020-03-07: 60 mg via INTRAVENOUS

## 2020-03-07 MED ORDER — ONDANSETRON HCL 4 MG/2ML IJ SOLN
INTRAMUSCULAR | Status: DC | PRN
  Administered 2020-03-07: 4 mg via INTRAVENOUS

## 2020-03-07 MED ORDER — DEXAMETHASONE SODIUM PHOSPHATE 10 MG/ML IJ SOLN
INTRAMUSCULAR | Status: DC | PRN
  Administered 2020-03-07: 5 mg via INTRAVENOUS

## 2020-03-07 MED ORDER — PROPOFOL 10 MG/ML IV BOLUS
INTRAVENOUS | Status: AC
  Filled 2020-03-07: qty 20

## 2020-03-07 MED ORDER — PROPOFOL 10 MG/ML IV BOLUS
INTRAVENOUS | Status: DC | PRN
  Administered 2020-03-07: 50 mg via INTRAVENOUS
  Administered 2020-03-07: 150 mg via INTRAVENOUS

## 2020-03-07 MED ORDER — FAMOTIDINE 20 MG PO TABS: 20.0000 mg | ORAL_TABLET | Freq: Once | ORAL | Status: AC

## 2020-03-07 MED ORDER — FENTANYL CITRATE (PF) 100 MCG/2ML IJ SOLN
INTRAMUSCULAR | Status: AC
  Filled 2020-03-07: qty 2

## 2020-03-07 MED ORDER — SUCCINYLCHOLINE CHLORIDE 200 MG/10ML IV SOSY
PREFILLED_SYRINGE | INTRAVENOUS | Status: AC
  Filled 2020-03-07: qty 10

## 2020-03-07 MED ORDER — SODIUM CHLORIDE 0.9 % IV SOLN
INTRAVENOUS | Status: DC | PRN
  Administered 2020-03-07: 20 ug/min via INTRAVENOUS
  Administered 2020-03-07: 60 ug/min via INTRAVENOUS

## 2020-03-07 MED ORDER — HEPARIN SODIUM (PORCINE) 1000 UNIT/ML IJ SOLN
INTRAMUSCULAR | Status: DC | PRN
Start: 2020-03-07 — End: 2020-03-07
  Administered 2020-03-07: 4000 [IU] via INTRAVENOUS

## 2020-03-07 MED ORDER — CHLORHEXIDINE GLUCONATE 0.12 % MT SOLN: 15.0000 mL | Freq: Once | OROMUCOSAL | Status: AC

## 2020-03-07 MED ORDER — FENTANYL CITRATE (PF) 100 MCG/2ML IJ SOLN
INTRAMUSCULAR | Status: DC | PRN
  Administered 2020-03-07: 50 ug via INTRAVENOUS
  Administered 2020-03-07 (×2): 25 ug via INTRAVENOUS

## 2020-03-07 MED ORDER — PHENYLEPHRINE HCL (PRESSORS) 10 MG/ML IV SOLN
INTRAVENOUS | Status: DC | PRN
  Administered 2020-03-07 (×2): 100 ug via INTRAVENOUS

## 2020-03-07 MED ORDER — TRAMADOL HCL 50 MG PO TABS: 50.0000 mg | ORAL_TABLET | Freq: Four times a day (QID) | ORAL | 0 refills | Status: DC | PRN

## 2020-03-07 MED ORDER — ONDANSETRON HCL 4 MG/2ML IJ SOLN: 4.0000 mg | Freq: Four times a day (QID) | INTRAMUSCULAR | Status: DC | PRN

## 2020-03-07 MED ORDER — BUPIVACAINE-EPINEPHRINE (PF) 0.5% -1:200000 IJ SOLN
INTRAMUSCULAR | Status: AC
  Filled 2020-03-07: qty 30

## 2020-03-07 MED ORDER — SEVOFLURANE IN SOLN
RESPIRATORY_TRACT | Status: AC
  Filled 2020-03-07: qty 250

## 2020-03-07 MED ORDER — SUCCINYLCHOLINE CHLORIDE 20 MG/ML IJ SOLN
INTRAMUSCULAR | Status: DC | PRN
  Administered 2020-03-07: 110 mg via INTRAVENOUS

## 2020-03-07 MED ORDER — CEFAZOLIN SODIUM-DEXTROSE 1-4 GM/50ML-% IV SOLN
INTRAVENOUS | Status: AC
  Filled 2020-03-07: qty 50

## 2020-03-07 MED ORDER — ORAL CARE MOUTH RINSE: 15.0000 mL | Freq: Once | OROMUCOSAL | Status: AC

## 2020-03-07 MED ORDER — SODIUM CHLORIDE 0.9 % IV SOLN
INTRAVENOUS | Status: DC | PRN
  Administered 2020-03-07: 25 mL via INTRAMUSCULAR

## 2020-03-07 MED ORDER — ONDANSETRON HCL 4 MG/2ML IJ SOLN: 4.0000 mg | Freq: Once | INTRAMUSCULAR | Status: DC | PRN

## 2020-03-07 MED ORDER — FAMOTIDINE 20 MG PO TABS
ORAL_TABLET | ORAL | Status: AC
  Administered 2020-03-07: 20 mg via ORAL
  Filled 2020-03-07: qty 1

## 2020-03-07 MED ORDER — MIDAZOLAM HCL 2 MG/2ML IJ SOLN
INTRAMUSCULAR | Status: AC
  Filled 2020-03-07: qty 2

## 2020-03-07 MED ORDER — FENTANYL CITRATE (PF) 100 MCG/2ML IJ SOLN
INTRAMUSCULAR | Status: AC
  Administered 2020-03-07: 25 ug via INTRAVENOUS
  Filled 2020-03-07: qty 2

## 2020-03-07 MED ORDER — FENTANYL CITRATE (PF) 100 MCG/2ML IJ SOLN: 12.5000 ug | Freq: Once | INTRAMUSCULAR | Status: DC | PRN

## 2020-03-07 MED ORDER — CEFAZOLIN SODIUM-DEXTROSE 1-4 GM/50ML-% IV SOLN
1.0000 g | INTRAVENOUS | Status: AC
  Administered 2020-03-07: 1 g via INTRAVENOUS

## 2020-03-07 MED ORDER — HEPARIN SODIUM (PORCINE) 5000 UNIT/ML IJ SOLN
INTRAMUSCULAR | Status: AC
  Filled 2020-03-07: qty 1

## 2020-03-07 MED ORDER — SODIUM CHLORIDE 0.9 % IV SOLN: INTRAVENOUS | Status: DC

## 2020-03-07 MED ORDER — MIDAZOLAM HCL 2 MG/2ML IJ SOLN
INTRAMUSCULAR | Status: DC | PRN
  Administered 2020-03-07 (×2): 1 mg via INTRAVENOUS

## 2020-03-07 MED ORDER — DEXMEDETOMIDINE HCL IN NACL 400 MCG/100ML IV SOLN
INTRAVENOUS | Status: DC | PRN
  Administered 2020-03-07 (×2): 4 ug via INTRAVENOUS

## 2020-03-07 MED ORDER — EPHEDRINE 5 MG/ML INJ
INTRAVENOUS | Status: AC
  Filled 2020-03-07: qty 10

## 2020-03-07 MED ORDER — FENTANYL CITRATE (PF) 100 MCG/2ML IJ SOLN
25.0000 ug | INTRAMUSCULAR | Status: DC | PRN
  Administered 2020-03-07 (×3): 25 ug via INTRAVENOUS

## 2020-03-07 MED ORDER — EPHEDRINE SULFATE 50 MG/ML IJ SOLN
INTRAMUSCULAR | Status: DC | PRN
  Administered 2020-03-07 (×6): 5 mg via INTRAVENOUS

## 2020-03-07 MED ORDER — CHLORHEXIDINE GLUCONATE 0.12 % MT SOLN
OROMUCOSAL | Status: AC
  Administered 2020-03-07: 15 mL via OROMUCOSAL
  Filled 2020-03-07: qty 15

## 2020-03-07 SURGICAL SUPPLY — 53 items
BAG DECANTER FOR FLEXI CONT (MISCELLANEOUS) ×2 IMPLANT
BLADE SURG SZ11 CARB STEEL (BLADE) ×2 IMPLANT
BOOT SUTURE AID YELLOW STND (SUTURE) ×2 IMPLANT
BRUSH SCRUB EZ  4% CHG (MISCELLANEOUS) ×1
BRUSH SCRUB EZ 4% CHG (MISCELLANEOUS) ×1 IMPLANT
CANISTER SUCT 1200ML W/VALVE (MISCELLANEOUS) ×2 IMPLANT
CHLORAPREP W/TINT 26 (MISCELLANEOUS) ×2 IMPLANT
CLIP SPRNG 6 S-JAW DBL (CLIP) ×1 IMPLANT
CLIP SPRNG 6MM S-JAW DBL (CLIP) ×2
COVER WAND RF STERILE (DRAPES) ×2 IMPLANT
DERMABOND ADVANCED (GAUZE/BANDAGES/DRESSINGS) ×1
DERMABOND ADVANCED .7 DNX12 (GAUZE/BANDAGES/DRESSINGS) ×1 IMPLANT
ELECT CAUTERY BLADE 6.4 (BLADE) ×2 IMPLANT
ELECT REM PT RETURN 9FT ADLT (ELECTROSURGICAL) ×2
ELECTRODE REM PT RTRN 9FT ADLT (ELECTROSURGICAL) ×1 IMPLANT
GEL ULTRASOUND 20GR AQUASONIC (MISCELLANEOUS) IMPLANT
GLOVE BIO SURGEON STRL SZ7 (GLOVE) ×4 IMPLANT
GLOVE INDICATOR 7.5 STRL GRN (GLOVE) ×2 IMPLANT
GOWN STRL REUS W/ TWL LRG LVL3 (GOWN DISPOSABLE) ×2 IMPLANT
GOWN STRL REUS W/ TWL XL LVL3 (GOWN DISPOSABLE) ×1 IMPLANT
GOWN STRL REUS W/TWL LRG LVL3 (GOWN DISPOSABLE) ×2
GOWN STRL REUS W/TWL XL LVL3 (GOWN DISPOSABLE) ×1
HEMOSTAT SURGICEL 2X3 (HEMOSTASIS) ×2 IMPLANT
IV NS 500ML (IV SOLUTION) ×1
IV NS 500ML BAXH (IV SOLUTION) ×1 IMPLANT
KIT TURNOVER KIT A (KITS) ×2 IMPLANT
LABEL OR SOLS (LABEL) ×2 IMPLANT
LOOP RED MAXI  1X406MM (MISCELLANEOUS) ×1
LOOP VESSEL MAXI 1X406 RED (MISCELLANEOUS) ×1 IMPLANT
LOOP VESSEL MINI 0.8X406 BLUE (MISCELLANEOUS) ×1 IMPLANT
LOOPS BLUE MINI 0.8X406MM (MISCELLANEOUS) ×1
NDL FILTER BLUNT 18X1 1/2 (NEEDLE) ×1 IMPLANT
NEEDLE FILTER BLUNT 18X 1/2SAF (NEEDLE) ×1
NEEDLE FILTER BLUNT 18X1 1/2 (NEEDLE) ×1 IMPLANT
NS IRRIG 500ML POUR BTL (IV SOLUTION) ×2 IMPLANT
PACK EXTREMITY (MISCELLANEOUS) ×2 IMPLANT
PAD PREP 24X41 OB/GYN DISP (PERSONAL CARE ITEMS) ×2 IMPLANT
SOLUTION CELL SAVER (CLIP) ×1 IMPLANT
STOCKINETTE 48X4 2 PLY STRL (GAUZE/BANDAGES/DRESSINGS) ×1 IMPLANT
STOCKINETTE STRL 4IN 9604848 (GAUZE/BANDAGES/DRESSINGS) ×2 IMPLANT
SUT MNCRL AB 4-0 PS2 18 (SUTURE) ×2 IMPLANT
SUT PROLENE 6 0 BV (SUTURE) ×8 IMPLANT
SUT SILK 2 0 (SUTURE) ×1
SUT SILK 2-0 18XBRD TIE 12 (SUTURE) ×1 IMPLANT
SUT SILK 3 0 (SUTURE) ×1
SUT SILK 3-0 18XBRD TIE 12 (SUTURE) ×1 IMPLANT
SUT SILK 4 0 (SUTURE) ×1
SUT SILK 4-0 18XBRD TIE 12 (SUTURE) ×1 IMPLANT
SUT VIC AB 3-0 SH 27 (SUTURE) ×1
SUT VIC AB 3-0 SH 27X BRD (SUTURE) ×1 IMPLANT
SYR 20ML LL LF (SYRINGE) ×2 IMPLANT
SYR 3ML LL SCALE MARK (SYRINGE) ×2 IMPLANT
SYR TB 1ML 27GX1/2 LL (SYRINGE) IMPLANT

## 2020-03-07 NOTE — Transfer of Care (Signed)
Immediate Anesthesia Transfer of Care Note  Patient: Benjamin Stewart  Procedure(s) Performed: ARTERIOVENOUS (AV) FISTULA CREATION BRACHIAL CEPHALIC (Left Arm Upper)  Patient Location: PACU  Anesthesia Type:General  Level of Consciousness: awake  Airway & Oxygen Therapy: Patient Spontanous Breathing and Patient connected to face mask oxygen  Post-op Assessment: Report given to RN and Post -op Vital signs reviewed and stable  Post vital signs: Reviewed  Last Vitals:  Vitals Value Taken Time  BP    Temp    Pulse    Resp    SpO2      Last Pain:  Vitals:   03/07/20 1051  TempSrc: Temporal  PainSc: 0-No pain         Complications: No complications documented.

## 2020-03-07 NOTE — Anesthesia Procedure Notes (Deleted)
Procedure Name: Intubation Performed by: Rolla Plate, CRNA Pre-anesthesia Checklist: Patient identified, Patient being monitored, Timeout performed, Emergency Drugs available and Suction available Patient Re-evaluated:Patient Re-evaluated prior to induction Oxygen Delivery Method: Circle system utilized Preoxygenation: Pre-oxygenation with 100% oxygen Induction Type: IV induction Ventilation: Mask ventilation without difficulty Laryngoscope Size: Mac and 4 Grade View: Grade II Tube type: Oral Tube size: 7.5 mm Number of attempts: 1 Airway Equipment and Method: Stylet and Video-laryngoscopy Placement Confirmation: ETT inserted through vocal cords under direct vision,  positive ETCO2 and breath sounds checked- equal and bilateral Secured at: 23 cm Tube secured with: Tape Dental Injury: Teeth and Oropharynx as per pre-operative assessment

## 2020-03-07 NOTE — Progress Notes (Signed)
CBG 109 

## 2020-03-07 NOTE — Interval H&P Note (Signed)
History and Physical Interval Note:  03/07/2020 12:11 PM  Benjamin Stewart  has presented today for surgery, with the diagnosis of ESRD.  The various methods of treatment have been discussed with the patient and family. After consideration of risks, benefits and other options for treatment, the patient has consented to  Procedure(s): ARTERIOVENOUS (AV) FISTULA CREATION ( BRACHIAL CEPHALIC POSS. GRAFT ) (Left) as a surgical intervention.  The patient's history has been reviewed, patient examined, no change in status, stable for surgery.  I have reviewed the patient's chart and labs.  Questions were answered to the patient's satisfaction.     [name]

## 2020-03-07 NOTE — Op Note (Signed)
[facility]   OPERATIVE NOTE   PROCEDURE: Left brachiocephalic arteriovenous fistula placement  PRE-OPERATIVE DIAGNOSIS: 1.  ESRD        POST-OPERATIVE DIAGNOSIS: 1. ESRD       SURGEON: [name], MD  ASSISTANT(S): Hezzie Bump, PA-C  ANESTHESIA: general  ESTIMATED BLOOD LOSS: 10 cc  FINDING(S): Adequate cephalic vein for fistula creation  SPECIMEN(S):  none  INDICATIONS:   Benjamin Stewart is a 44 y.o. male who presents with renal failure in need of pemanent dialysis acces.  The patient is scheduled for left arm AVF placement.  The patient is aware the risks include but are not limited to: bleeding, infection, steal syndrome, nerve damage, ischemic monomelic neuropathy, failure to mature, and need for additional procedures.  The patient is aware of the risks of the procedure and elects to proceed forward. An assistant was present during the procedure to help facilitate the exposure and expedite the procedure.  DESCRIPTION: After full informed written consent was obtained from the patient, the patient was brought back to the operating room and placed supine upon the operating table.  Prior to induction, the patient received IV antibiotics. The assistant provided retraction and mobilization to help facilitate exposure and expedite the procedure throughout the entire procedure.  This included following suture, using retractors, and optimizing lighting.  After obtaining adequate anesthesia, the patient was then prepped and draped in the standard fashion for a left arm access procedure.  I made a curvilinear incision at the level of the antecubital fossa and dissected through the subcutaneous tissue and fascia to gain exposure of the brachial artery.  This was noted to be patent and adequate in size for fistula creation.  This was dissected out proximally and distally and prepared for control with vessel loops .  I then dissected out the cephalic vein.  This was  noted to be patent and adequate in size for fistula creation.  I then gave the patient 4000 units of intravenous heparin.  The vein was marked for orientation and the distal segment of the vein was ligated with a  2-0 silk, and the vein was transected.  I then instilled the heparinized saline into the vein and clamped it.  At this point, I reset my exposure of the brachial artery and pulled up control on the vessel loops.  I made an arteriotomy with a #11 blade, and then I extended the arteriotomy with a Potts scissor.  I injected heparinized saline proximal and distal to this arteriotomy.  The vein was then sewn to the artery in an end-to-side configuration with a running stitch of 6-0 Prolene.  Prior to completing this anastomosis, I allowed the vein and artery to backbleed.  There was no evidence of clot from any vessels.  I completed the anastomosis in the usual fashion and then released all vessel loops and clamps.  There was a palpable  thrill in the venous outflow, and there was a palpable pulse in the artery distal to the anastomosis.  At this point, I irrigated out the surgical wound.  Surgicel was placed. There was no further active bleeding.  The subcutaneous tissue was reapproximated with a running stitch of 3-0 Vicryl.  The skin was then closed with a 4-0 Monocryl suture.  The skin was then cleaned, dried, and reinforced with Dermabond.  The patient tolerated this procedure well and was taken to the recovery room in stable condition  COMPLICATIONS: None  CONDITION: Stable   [name]  03/07/2020, 2:20 PM  This note was created with Dragon Medical transcription system. Any errors in dictation are purely unintentional.

## 2020-03-07 NOTE — Anesthesia Procedure Notes (Signed)
Procedure Name: Intubation Date/Time: 03/07/2020 12:54 PM Performed by: Nolon Lennert, RN Pre-anesthesia Checklist: Patient identified, Patient being monitored, Timeout performed, Emergency Drugs available and Suction available Patient Re-evaluated:Patient Re-evaluated prior to induction Oxygen Delivery Method: Circle system utilized Preoxygenation: Pre-oxygenation with 100% oxygen Induction Type: IV induction Ventilation: Mask ventilation without difficulty Laryngoscope Size: Mac and 4 Grade View: Grade I Tube type: Oral Tube size: 7.5 mm Number of attempts: 1 Airway Equipment and Method: Stylet Placement Confirmation: ETT inserted through vocal cords under direct vision,  positive ETCO2 and breath sounds checked- equal and bilateral Secured at: 24 cm Tube secured with: Tape Dental Injury: Teeth and Oropharynx as per pre-operative assessment

## 2020-03-07 NOTE — Discharge Instructions (Signed)

## 2020-03-08 ENCOUNTER — Encounter: Payer: Self-pay | Admitting: Vascular Surgery

## 2020-03-11 ENCOUNTER — Other Ambulatory Visit (INDEPENDENT_AMBULATORY_CARE_PROVIDER_SITE_OTHER): Payer: Self-pay | Admitting: Vascular Surgery

## 2020-03-11 DIAGNOSIS — N186 End stage renal disease: Secondary | ICD-10-CM

## 2020-03-11 DIAGNOSIS — Z9889 Other specified postprocedural states: Secondary | ICD-10-CM

## 2020-03-12 ENCOUNTER — Ambulatory Visit (INDEPENDENT_AMBULATORY_CARE_PROVIDER_SITE_OTHER): Payer: 59

## 2020-03-12 ENCOUNTER — Other Ambulatory Visit: Payer: Self-pay

## 2020-03-12 DIAGNOSIS — N186 End stage renal disease: Secondary | ICD-10-CM | POA: Diagnosis not present

## 2020-03-12 DIAGNOSIS — Z9889 Other specified postprocedural states: Secondary | ICD-10-CM

## 2020-03-13 NOTE — Anesthesia Preprocedure Evaluation (Signed)
Anesthesia Evaluation  Patient identified by MRN, date of birth, ID band Patient awake    Reviewed: Allergy & Precautions, H&P , NPO status , Patient's Chart, lab work & pertinent test results, reviewed documented beta blocker date and time   Airway Mallampati: II  TM Distance: >3 FB Neck ROM: full    Dental  (+) Teeth Intact   Pulmonary sleep apnea ,    Pulmonary exam normal        Cardiovascular Exercise Tolerance: Poor hypertension, On Medications negative cardio ROS Normal cardiovascular exam Rate:Normal     Neuro/Psych negative neurological ROS  negative psych ROS   GI/Hepatic negative GI ROS, Neg liver ROS,   Endo/Other  negative endocrine ROSdiabetes  Renal/GU Renal disease  negative genitourinary   Musculoskeletal   Abdominal   Peds  Hematology  (+) Blood dyscrasia, anemia ,   Anesthesia Other Findings   Reproductive/Obstetrics negative OB ROS                             Anesthesia Physical Anesthesia Plan  ASA: IV  Anesthesia Plan: General LMA   Post-op Pain Management:    Induction:   PONV Risk Score and Plan: 2  Airway Management Planned:   Additional Equipment:   Intra-op Plan:   Post-operative Plan:   Informed Consent: I have reviewed the patients History and Physical, chart, labs and discussed the procedure including the risks, benefits and alternatives for the proposed anesthesia with the patient or authorized representative who has indicated his/her understanding and acceptance.       Plan Discussed with: CRNA  Anesthesia Plan Comments:         Anesthesia Quick Evaluation

## 2020-03-13 NOTE — Anesthesia Postprocedure Evaluation (Signed)
Anesthesia Post Note  Patient: Benjamin Stewart  Procedure(s) Performed: ARTERIOVENOUS (AV) FISTULA CREATION BRACHIAL CEPHALIC (Left Arm Upper)  Patient location during evaluation: PACU Anesthesia Type: General Level of consciousness: awake and alert Pain management: pain level controlled Vital Signs Assessment: post-procedure vital signs reviewed and stable Respiratory status: spontaneous breathing, nonlabored ventilation, respiratory function stable and patient connected to nasal cannula oxygen Cardiovascular status: blood pressure returned to baseline and stable Postop Assessment: no apparent nausea or vomiting Anesthetic complications: no   No complications documented.   Last Vitals:  Vitals:   03/07/20 1516 03/07/20 1535  BP: 104/65 (!) 100/60  Pulse: 79 78  Resp: 16 16  Temp: 36.6 C   SpO2: 96% 98%    Last Pain:  Vitals:   03/07/20 1535  TempSrc:   PainSc: [name]

## 2020-04-03 LAB — HEMOGLOBIN A1C: Hemoglobin A1C: 4.6

## 2020-04-10 ENCOUNTER — Other Ambulatory Visit (INDEPENDENT_AMBULATORY_CARE_PROVIDER_SITE_OTHER): Payer: Self-pay | Admitting: Nurse Practitioner

## 2020-04-10 DIAGNOSIS — Z95828 Presence of other vascular implants and grafts: Secondary | ICD-10-CM

## 2020-04-10 DIAGNOSIS — N186 End stage renal disease: Secondary | ICD-10-CM

## 2020-04-11 ENCOUNTER — Telehealth (INDEPENDENT_AMBULATORY_CARE_PROVIDER_SITE_OTHER): Payer: Self-pay

## 2020-04-11 ENCOUNTER — Ambulatory Visit (INDEPENDENT_AMBULATORY_CARE_PROVIDER_SITE_OTHER): Payer: 59 | Admitting: Nurse Practitioner

## 2020-04-11 ENCOUNTER — Ambulatory Visit (INDEPENDENT_AMBULATORY_CARE_PROVIDER_SITE_OTHER): Payer: 59

## 2020-04-11 ENCOUNTER — Encounter (INDEPENDENT_AMBULATORY_CARE_PROVIDER_SITE_OTHER): Payer: Self-pay | Admitting: Nurse Practitioner

## 2020-04-11 ENCOUNTER — Other Ambulatory Visit: Payer: Self-pay

## 2020-04-11 VITALS — BP 132/81 | HR 84 | Ht 72.0 in | Wt 274.0 lb

## 2020-04-11 DIAGNOSIS — N186 End stage renal disease: Secondary | ICD-10-CM | POA: Insufficient documentation

## 2020-04-11 DIAGNOSIS — I10 Essential (primary) hypertension: Secondary | ICD-10-CM

## 2020-04-11 DIAGNOSIS — E785 Hyperlipidemia, unspecified: Secondary | ICD-10-CM

## 2020-04-11 DIAGNOSIS — Z95828 Presence of other vascular implants and grafts: Secondary | ICD-10-CM

## 2020-04-11 DIAGNOSIS — Z9889 Other specified postprocedural states: Secondary | ICD-10-CM

## 2020-04-11 DIAGNOSIS — E1169 Type 2 diabetes mellitus with other specified complication: Secondary | ICD-10-CM

## 2020-04-11 NOTE — Telephone Encounter (Signed)
I attempted to contact the patient and a message was left on his mobile and home phone voicemail for a return call.

## 2020-04-12 ENCOUNTER — Telehealth (INDEPENDENT_AMBULATORY_CARE_PROVIDER_SITE_OTHER): Payer: Self-pay

## 2020-04-12 NOTE — Telephone Encounter (Signed)
Spoke with the patient and he is scheduled with Dr. Lucky Cowboy on 04/18/20 with a 11:30 am arrival time to the [facility]. Covid testing is on 04/16/20 between 8-1 pm at the [facility]. Pre-procedure instructions were discussed and will be mailed.

## 2020-04-15 ENCOUNTER — Encounter (INDEPENDENT_AMBULATORY_CARE_PROVIDER_SITE_OTHER): Payer: Self-pay | Admitting: Nurse Practitioner

## 2020-04-15 NOTE — Progress Notes (Signed)
Subjective:    Patient ID: Benjamin Stewart, male    DOB: 04-Dec-1975, 44 y.o.   MRN: [id number] Chief Complaint  Patient presents with  . Follow-up    U/S follow up    The patient returns to the office for follow up regarding problem with the dialysis access. Currently the patient is maintained via a PermCath but recently had a left brachiocephalic AV fistula created on 03/07/2020.   The patient denies any arm swelling.  The patient has not yet utilized his access yet.  However, the patient notes that he has had numbness and tingling of his left arm that is intermittent at times.  He denies any prolonged numbness or coldness of his fingertips.  He denies any discoloration.  The patient denies redness or swelling at the access site. The patient denies fever or chills at home or while on dialysis.  The patient denies amaurosis fugax or recent TIA symptoms. There are no recent neurological changes noted. The patient denies claudication symptoms or rest pain symptoms. The patient denies history of DVT, PE or superficial thrombophlebitis. The patient denies recent episodes of angina or shortness of breath.   Noninvasive studies show flow volume of 878.  The brachiocephalic AV fistula appears to be patent throughout however the radial and ulnar artery were imaged.  There is diminished flow seen in the left ulnar artery at the mid to distal segment.  Arterial flow in the brachial artery antecubital fossa appears to be decreased as well.   Review of Systems  Neurological: Positive for numbness.  All other systems reviewed and are negative.      Objective:   Physical Exam Vitals reviewed.  HENT:     Head: Normocephalic.  Cardiovascular:     Rate and Rhythm: Normal rate and regular rhythm.     Pulses:          Radial pulses are 2+ on the right side and 1+ on the left side.     Arteriovenous access: left arteriovenous access is present.    Comments: Good thrill and bruit of left  brachiocephalic AV fistula Pulmonary:     Effort: Pulmonary effort is normal.  Skin:    General: Skin is warm and dry.  Neurological:     Mental Status: He is alert and oriented to person, place, and time.  Psychiatric:        Mood and Affect: Mood normal.        Behavior: Behavior normal.        Thought Content: Thought content normal.        Judgment: Judgment normal.     BP 132/81   Pulse 84   Ht 6' (1.829 m)   Wt 274 lb (124.3 kg)   BMI 37.16 kg/m   Past Medical History:  Diagnosis Date  . Anemia   . Benign essential hematuria 12/04/2014   microscopic hematuria evaluation with CT and cysto - treated for 2 months with bactrim DS; no further f/u planned unless hematuria recurs.   . Chronic kidney disease   . Controlled type 2 diabetes mellitus without complication, without long-term current use of insulin ([place]) 07/16/2015  . COVID-19 virus infection 12/29/2019  . Essential (primary) hypertension 12/04/2014  . History of kidney stones    H/O  . prostate cancer    PROSTATE   . Sleep apnea    DOES NOT USE CPAP    Social History   Socioeconomic History  . Marital status: Divorced  Spouse name: Not on file  . Number of children: Not on file  . Years of education: Not on file  . Highest education level: Not on file  Occupational History  . Not on file  Tobacco Use  . Smoking status: Never Smoker  . Smokeless tobacco: Never Used  Vaping Use  . Vaping Use: Never used  Substance and Sexual Activity  . Alcohol use: No    Alcohol/week: 0.0 standard drinks  . Drug use: No  . Sexual activity: Not on file  Other Topics Concern  . Not on file  Social History Narrative  . Not on file   Social Determinants of Health   Financial Resource Strain:   . Difficulty of Paying Living Expenses: Not on file  Food Insecurity:   . Worried About Charity fundraiser in the Last Year: Not on file  . Ran Out of Food in the Last Year: Not on file  Transportation Needs:   . Lack  of Transportation (Medical): Not on file  . Lack of Transportation (Non-Medical): Not on file  Physical Activity:   . Days of Exercise per Week: Not on file  . Minutes of Exercise per Session: Not on file  Stress:   . Feeling of Stress : Not on file  Social Connections:   . Frequency of Communication with Friends and Family: Not on file  . Frequency of Social Gatherings with Friends and Family: Not on file  . Attends Religious Services: Not on file  . Active Member of Clubs or Organizations: Not on file  . Attends Archivist Meetings: Not on file  . Marital Status: Not on file  Intimate Partner Violence:   . Fear of Current or Ex-Partner: Not on file  . Emotionally Abused: Not on file  . Physically Abused: Not on file  . Sexually Abused: Not on file    Past Surgical History:  Procedure Laterality Date  . AV FISTULA PLACEMENT Left 03/07/2020   Procedure: ARTERIOVENOUS (AV) FISTULA CREATION BRACHIAL CEPHALIC;  Surgeon: Algernon Huxley, MD;  Location: [facility] ORS;  Service: Vascular;  Laterality: Left;  . DIALYSIS/PERMA CATHETER INSERTION N/A 01/17/2020   Procedure: DIALYSIS/PERMA CATHETER INSERTION;  Surgeon: Algernon Huxley, MD;  Location: [facility];  Service: Cardiovascular;  Laterality: N/A;  . PARTIAL COLECTOMY  2005  . RADIOACTIVE SEED IMPLANT N/A 12/06/2017   Procedure: RADIOACTIVE SEED IMPLANT/BRACHYTHERAPY IMPLANT;  Surgeon: Hollice Espy, MD;  Location: [facility] OR[facility];  Service: Urology;  Laterality: N/A;    Family History  Problem Relation Age of Onset  . Diabetes Mother   . Heart disease Mother   . Hypertension Mother   . Glaucoma Mother   . Prostate cancer Father   . Diabetes Maternal Grandmother   . Diabetes Maternal Grandfather   . Prostate cancer Maternal Uncle     Allergies  Allergen Reactions  . Glimepiride Other (See Comments)    Severe hypoglycemia  . Other   . Percocet [Oxycodone-Acetaminophen] Nausea And Vomiting       Assessment & Plan:    1. End stage renal disease ([place]) Recommend:  The patient is experiencing increasing problems with numbness in his left arm.  Patient should have an angiogram of the left upper extremity with the intention for intervention.  The intention for intervention is to restore appropriate flow and prevent thrombosis and possible loss of the access.  As well as improve the quality of dialysis therapy.  The risks, benefits and alternative therapies  were reviewed in detail with the patient.  All questions were answered.  The patient agrees to proceed with angio/intervention.      2. Essential (primary) hypertension Continue antihypertensive medications as already ordered, these medications have been reviewed and there are no changes at this time.   3. Hyperlipidemia associated with type 2 diabetes mellitus ([place]) Continue statin as ordered and reviewed, no changes at this time    Current Outpatient Medications on File Prior to Visit  Medication Sig Dispense Refill  . amLODipine (NORVASC) 10 MG tablet TAKE 1 TABLET BY MOUTH EVERY DAY (Patient taking differently: Take 10 mg by mouth every morning. ) 90 tablet 1  . atorvastatin (LIPITOR) 10 MG tablet TAKE 1 TABLET BY MOUTH EVERY DAY WITH BREAKFAST 90 tablet 3  . calcitRIOL (ROCALTROL) 0.25 MCG capsule Take 1 capsule (0.25 mcg total) by mouth daily. 90 capsule 1  . carvedilol (COREG) 6.25 MG tablet Take 6.25 mg by mouth 2 (two) times daily with a meal.     . cloNIDine (CATAPRES - DOSED IN MG/24 HR) 0.2 mg/24hr patch Place 1 patch (0.2 mg total) onto the skin once a week. (Patient taking differently: Place 0.2 mg onto the skin once a week. LAST CHANGED ON 03-05-20) 4 patch 6  . hydrALAZINE (APRESOLINE) 100 MG tablet Take 100 mg by mouth 3 (three) times daily.    . sevelamer carbonate (RENVELA) 800 MG tablet Take 1 tablet (800 mg total) by mouth 2 (two) times daily with a meal. 60 tablet 1  . sodium bicarbonate 650 MG tablet Take 1 tablet (650 mg total)  by mouth 3 (three) times daily. 90 tablet 1  . traMADol (ULTRAM) 50 MG tablet Take 1 tablet (50 mg total) by mouth every 6 (six) hours as needed. 25 tablet 0  . HYDROcodone-acetaminophen (NORCO/VICODIN) 5-325 MG tablet hydrocodone 5 mg-acetaminophen 325 mg tablet  TAKE 1 TO 2 TABLETS BY MOUTH EVERY 4 TO 6 HOURS AS NEEDED FOR PAIN    . olmesartan (BENICAR) 40 MG tablet olmesartan 40 mg tablet     No current facility-administered medications on file prior to visit.    There are no Patient Instructions on file for this visit. No follow-ups on file.   Kris Hartmann, NP

## 2020-04-15 NOTE — H&P (View-Only) (Signed)
Subjective:    Patient ID: Benjamin Stewart, male    DOB: 01-Oct-1975, 44 y.o.   MRN: [id number] Chief Complaint  Patient presents with  . Follow-up    U/S follow up    The patient returns to the office for follow up regarding problem with the dialysis access. Currently the patient is maintained via a PermCath but recently had a left brachiocephalic AV fistula created on 03/07/2020.   The patient denies any arm swelling.  The patient has not yet utilized his access yet.  However, the patient notes that he has had numbness and tingling of his left arm that is intermittent at times.  He denies any prolonged numbness or coldness of his fingertips.  He denies any discoloration.  The patient denies redness or swelling at the access site. The patient denies fever or chills at home or while on dialysis.  The patient denies amaurosis fugax or recent TIA symptoms. There are no recent neurological changes noted. The patient denies claudication symptoms or rest pain symptoms. The patient denies history of DVT, PE or superficial thrombophlebitis. The patient denies recent episodes of angina or shortness of breath.   Noninvasive studies show flow volume of 878.  The brachiocephalic AV fistula appears to be patent throughout however the radial and ulnar artery were imaged.  There is diminished flow seen in the left ulnar artery at the mid to distal segment.  Arterial flow in the brachial artery antecubital fossa appears to be decreased as well.   Review of Systems  Neurological: Positive for numbness.  All other systems reviewed and are negative.      Objective:   Physical Exam Vitals reviewed.  HENT:     Head: Normocephalic.  Cardiovascular:     Rate and Rhythm: Normal rate and regular rhythm.     Pulses:          Radial pulses are 2+ on the right side and 1+ on the left side.     Arteriovenous access: left arteriovenous access is present.    Comments: Good thrill and bruit of left  brachiocephalic AV fistula Pulmonary:     Effort: Pulmonary effort is normal.  Skin:    General: Skin is warm and dry.  Neurological:     Mental Status: He is alert and oriented to person, place, and time.  Psychiatric:        Mood and Affect: Mood normal.        Behavior: Behavior normal.        Thought Content: Thought content normal.        Judgment: Judgment normal.     BP 132/81   Pulse 84   Ht 6' (1.829 m)   Wt 274 lb (124.3 kg)   BMI 37.16 kg/m   Past Medical History:  Diagnosis Date  . Anemia   . Benign essential hematuria 12/04/2014   microscopic hematuria evaluation with CT and cysto - treated for 2 months with bactrim DS; no further f/u planned unless hematuria recurs.   . Chronic kidney disease   . Controlled type 2 diabetes mellitus without complication, without long-term current use of insulin ([place]) 07/16/2015  . COVID-19 virus infection 12/29/2019  . Essential (primary) hypertension 12/04/2014  . History of kidney stones    H/O  . prostate cancer    PROSTATE   . Sleep apnea    DOES NOT USE CPAP    Social History   Socioeconomic History  . Marital status: Divorced  Spouse name: Not on file  . Number of children: Not on file  . Years of education: Not on file  . Highest education level: Not on file  Occupational History  . Not on file  Tobacco Use  . Smoking status: Never Smoker  . Smokeless tobacco: Never Used  Vaping Use  . Vaping Use: Never used  Substance and Sexual Activity  . Alcohol use: No    Alcohol/week: 0.0 standard drinks  . Drug use: No  . Sexual activity: Not on file  Other Topics Concern  . Not on file  Social History Narrative  . Not on file   Social Determinants of Health   Financial Resource Strain:   . Difficulty of Paying Living Expenses: Not on file  Food Insecurity:   . Worried About Charity fundraiser in the Last Year: Not on file  . Ran Out of Food in the Last Year: Not on file  Transportation Needs:   . Lack  of Transportation (Medical): Not on file  . Lack of Transportation (Non-Medical): Not on file  Physical Activity:   . Days of Exercise per Week: Not on file  . Minutes of Exercise per Session: Not on file  Stress:   . Feeling of Stress : Not on file  Social Connections:   . Frequency of Communication with Friends and Family: Not on file  . Frequency of Social Gatherings with Friends and Family: Not on file  . Attends Religious Services: Not on file  . Active Member of Clubs or Organizations: Not on file  . Attends Archivist Meetings: Not on file  . Marital Status: Not on file  Intimate Partner Violence:   . Fear of Current or Ex-Partner: Not on file  . Emotionally Abused: Not on file  . Physically Abused: Not on file  . Sexually Abused: Not on file    Past Surgical History:  Procedure Laterality Date  . AV FISTULA PLACEMENT Left 03/07/2020   Procedure: ARTERIOVENOUS (AV) FISTULA CREATION BRACHIAL CEPHALIC;  Surgeon: Algernon Huxley, MD;  Location: [facility];  Service: Vascular;  Laterality: Left;  . DIALYSIS/PERMA CATHETER INSERTION N/A 01/17/2020   Procedure: DIALYSIS/PERMA CATHETER INSERTION;  Surgeon: Algernon Huxley, MD;  Location: [facility];  Service: Cardiovascular;  Laterality: N/A;  . PARTIAL COLECTOMY  2005  . RADIOACTIVE SEED IMPLANT N/A 12/06/2017   Procedure: RADIOACTIVE SEED IMPLANT/BRACHYTHERAPY IMPLANT;  Surgeon: Hollice Espy, MD;  Location: [facility];  Service: Urology;  Laterality: N/A;    Family History  Problem Relation Age of Onset  . Diabetes Mother   . Heart disease Mother   . Hypertension Mother   . Glaucoma Mother   . Prostate cancer Father   . Diabetes Maternal Grandmother   . Diabetes Maternal Grandfather   . Prostate cancer Maternal Uncle     Allergies  Allergen Reactions  . Glimepiride Other (See Comments)    Severe hypoglycemia  . Other   . Percocet [Oxycodone-Acetaminophen] Nausea And Vomiting       Assessment & Plan:    1. End stage renal disease ([place]) Recommend:  The patient is experiencing increasing problems with numbness in his left arm.  Patient should have an angiogram of the left upper extremity with the intention for intervention.  The intention for intervention is to restore appropriate flow and prevent thrombosis and possible loss of the access.  As well as improve the quality of dialysis therapy.  The risks, benefits and alternative therapies  were reviewed in detail with the patient.  All questions were answered.  The patient agrees to proceed with angio/intervention.      2. Essential (primary) hypertension Continue antihypertensive medications as already ordered, these medications have been reviewed and there are no changes at this time.   3. Hyperlipidemia associated with type 2 diabetes mellitus ([place]) Continue statin as ordered and reviewed, no changes at this time    Current Outpatient Medications on File Prior to Visit  Medication Sig Dispense Refill  . amLODipine (NORVASC) 10 MG tablet TAKE 1 TABLET BY MOUTH EVERY DAY (Patient taking differently: Take 10 mg by mouth every morning. ) 90 tablet 1  . atorvastatin (LIPITOR) 10 MG tablet TAKE 1 TABLET BY MOUTH EVERY DAY WITH BREAKFAST 90 tablet 3  . calcitRIOL (ROCALTROL) 0.25 MCG capsule Take 1 capsule (0.25 mcg total) by mouth daily. 90 capsule 1  . carvedilol (COREG) 6.25 MG tablet Take 6.25 mg by mouth 2 (two) times daily with a meal.     . cloNIDine (CATAPRES - DOSED IN MG/24 HR) 0.2 mg/24hr patch Place 1 patch (0.2 mg total) onto the skin once a week. (Patient taking differently: Place 0.2 mg onto the skin once a week. LAST CHANGED ON 03-05-20) 4 patch 6  . hydrALAZINE (APRESOLINE) 100 MG tablet Take 100 mg by mouth 3 (three) times daily.    . sevelamer carbonate (RENVELA) 800 MG tablet Take 1 tablet (800 mg total) by mouth 2 (two) times daily with a meal. 60 tablet 1  . sodium bicarbonate 650 MG tablet Take 1 tablet (650 mg total)  by mouth 3 (three) times daily. 90 tablet 1  . traMADol (ULTRAM) 50 MG tablet Take 1 tablet (50 mg total) by mouth every 6 (six) hours as needed. 25 tablet 0  . HYDROcodone-acetaminophen (NORCO/VICODIN) 5-325 MG tablet hydrocodone 5 mg-acetaminophen 325 mg tablet  TAKE 1 TO 2 TABLETS BY MOUTH EVERY 4 TO 6 HOURS AS NEEDED FOR PAIN    . olmesartan (BENICAR) 40 MG tablet olmesartan 40 mg tablet     No current facility-administered medications on file prior to visit.    There are no Patient Instructions on file for this visit. No follow-ups on file.   Kris Hartmann, NP

## 2020-04-16 ENCOUNTER — Other Ambulatory Visit
Admission: RE | Admit: 2020-04-16 | Discharge: 2020-04-16 | Disposition: A | Discharge status: Home / Self Care | Payer: 59 | Source: Ambulatory Visit | Attending: Vascular Surgery | Admitting: Vascular Surgery

## 2020-04-16 DIAGNOSIS — Z20822 Contact with and (suspected) exposure to covid-19: Secondary | ICD-10-CM | POA: Insufficient documentation

## 2020-04-16 DIAGNOSIS — Z01812 Encounter for preprocedural laboratory examination: Secondary | ICD-10-CM | POA: Diagnosis not present

## 2020-04-17 ENCOUNTER — Other Ambulatory Visit (INDEPENDENT_AMBULATORY_CARE_PROVIDER_SITE_OTHER): Payer: Self-pay | Admitting: Nurse Practitioner

## 2020-04-17 LAB — SARS CORONAVIRUS 2 (TAT 6-24 HRS): SARS Coronavirus 2: NEGATIVE

## 2020-04-17 MED ORDER — CEFAZOLIN SODIUM-DEXTROSE 1-4 GM/50ML-% IV SOLN: 1.0000 g | Freq: Once | INTRAVENOUS | Status: AC

## 2020-04-18 ENCOUNTER — Encounter: Payer: Self-pay | Admitting: Vascular Surgery

## 2020-04-18 ENCOUNTER — Ambulatory Visit
Admission: RE | Admit: 2020-04-18 | Discharge: 2020-04-18 | Disposition: A | Discharge status: Home / Self Care | Payer: 59 | Attending: Vascular Surgery | Admitting: Vascular Surgery

## 2020-04-18 ENCOUNTER — Other Ambulatory Visit: Payer: Self-pay

## 2020-04-18 ENCOUNTER — Encounter
Admission: RE | Disposition: A | Discharge status: Home / Self Care | Payer: Self-pay | Source: Home / Self Care | Attending: Vascular Surgery

## 2020-04-18 DIAGNOSIS — Y832 Surgical operation with anastomosis, bypass or graft as the cause of abnormal reaction of the patient, or of later complication, without mention of misadventure at the time of the procedure: Secondary | ICD-10-CM

## 2020-04-18 DIAGNOSIS — Z885 Allergy status to narcotic agent status: Secondary | ICD-10-CM | POA: Diagnosis not present

## 2020-04-18 DIAGNOSIS — Z992 Dependence on renal dialysis: Secondary | ICD-10-CM | POA: Insufficient documentation

## 2020-04-18 DIAGNOSIS — G4730 Sleep apnea, unspecified: Secondary | ICD-10-CM | POA: Diagnosis not present

## 2020-04-18 DIAGNOSIS — N186 End stage renal disease: Secondary | ICD-10-CM

## 2020-04-18 DIAGNOSIS — E785 Hyperlipidemia, unspecified: Secondary | ICD-10-CM | POA: Insufficient documentation

## 2020-04-18 DIAGNOSIS — T82898A Other specified complication of vascular prosthetic devices, implants and grafts, initial encounter: Secondary | ICD-10-CM

## 2020-04-18 DIAGNOSIS — T82858A Stenosis of vascular prosthetic devices, implants and grafts, initial encounter: Secondary | ICD-10-CM | POA: Insufficient documentation

## 2020-04-18 DIAGNOSIS — Z8616 Personal history of COVID-19: Secondary | ICD-10-CM | POA: Insufficient documentation

## 2020-04-18 DIAGNOSIS — Z8249 Family history of ischemic heart disease and other diseases of the circulatory system: Secondary | ICD-10-CM | POA: Insufficient documentation

## 2020-04-18 DIAGNOSIS — E1169 Type 2 diabetes mellitus with other specified complication: Secondary | ICD-10-CM | POA: Insufficient documentation

## 2020-04-18 DIAGNOSIS — Z888 Allergy status to other drugs, medicaments and biological substances status: Secondary | ICD-10-CM | POA: Insufficient documentation

## 2020-04-18 DIAGNOSIS — E1122 Type 2 diabetes mellitus with diabetic chronic kidney disease: Secondary | ICD-10-CM | POA: Insufficient documentation

## 2020-04-18 DIAGNOSIS — Z9049 Acquired absence of other specified parts of digestive tract: Secondary | ICD-10-CM | POA: Insufficient documentation

## 2020-04-18 DIAGNOSIS — Z79899 Other long term (current) drug therapy: Secondary | ICD-10-CM | POA: Diagnosis not present

## 2020-04-18 DIAGNOSIS — I120 Hypertensive chronic kidney disease with stage 5 chronic kidney disease or end stage renal disease: Secondary | ICD-10-CM | POA: Diagnosis not present

## 2020-04-18 DIAGNOSIS — I7789 Other specified disorders of arteries and arterioles: Secondary | ICD-10-CM

## 2020-04-18 DIAGNOSIS — Z833 Family history of diabetes mellitus: Secondary | ICD-10-CM | POA: Diagnosis not present

## 2020-04-18 HISTORY — PX: UPPER EXTREMITY ANGIOGRAPHY: CATH118270

## 2020-04-18 SURGERY — UPPER EXTREMITY ANGIOGRAPHY
Anesthesia: Moderate Sedation | Laterality: Left

## 2020-04-18 MED ORDER — SODIUM CHLORIDE 0.9 % IV SOLN
INTRAVENOUS | Status: DC
  Administered 2020-04-18: 10 mL via INTRAVENOUS

## 2020-04-18 MED ORDER — MIDAZOLAM HCL 5 MG/5ML IJ SOLN
INTRAMUSCULAR | Status: AC
  Filled 2020-04-18: qty 5

## 2020-04-18 MED ORDER — FENTANYL CITRATE (PF) 100 MCG/2ML IJ SOLN
INTRAMUSCULAR | Status: DC | PRN
Start: 2020-04-18 — End: 2020-04-18
  Administered 2020-04-18: 50 ug via INTRAVENOUS
  Administered 2020-04-18 (×2): 12.5 ug via INTRAVENOUS
  Administered 2020-04-18: 25 ug via INTRAVENOUS

## 2020-04-18 MED ORDER — MIDAZOLAM HCL 2 MG/ML PO SYRP: 8.0000 mg | ORAL_SOLUTION | Freq: Once | ORAL | Status: DC | PRN

## 2020-04-18 MED ORDER — DIPHENHYDRAMINE HCL 50 MG/ML IJ SOLN: 50.0000 mg | Freq: Once | INTRAMUSCULAR | Status: DC | PRN

## 2020-04-18 MED ORDER — METHYLPREDNISOLONE SODIUM SUCC 125 MG IJ SOLR: 125.0000 mg | Freq: Once | INTRAMUSCULAR | Status: DC | PRN

## 2020-04-18 MED ORDER — ONDANSETRON HCL 4 MG/2ML IJ SOLN: 4.0000 mg | Freq: Four times a day (QID) | INTRAMUSCULAR | Status: DC | PRN

## 2020-04-18 MED ORDER — NITROGLYCERIN 1 MG/10 ML FOR IR/CATH LAB
INTRA_ARTERIAL | Status: AC
  Filled 2020-04-18: qty 10

## 2020-04-18 MED ORDER — HEPARIN SODIUM (PORCINE) 1000 UNIT/ML IJ SOLN
INTRAMUSCULAR | Status: DC | PRN
  Administered 2020-04-18: 2000 [IU] via INTRAVENOUS
  Administered 2020-04-18: 4000 [IU] via INTRAVENOUS

## 2020-04-18 MED ORDER — CEFAZOLIN SODIUM-DEXTROSE 1-4 GM/50ML-% IV SOLN
INTRAVENOUS | Status: AC
  Administered 2020-04-18: 1 g via INTRAVENOUS
  Filled 2020-04-18: qty 50

## 2020-04-18 MED ORDER — ASPIRIN EC 81 MG PO TBEC: 81.0000 mg | DELAYED_RELEASE_TABLET | Freq: Every day | ORAL | 2 refills | Status: DC

## 2020-04-18 MED ORDER — IODIXANOL 320 MG/ML IV SOLN
INTRAVENOUS | Status: DC | PRN
  Administered 2020-04-18: 80 mL via INTRA_ARTERIAL

## 2020-04-18 MED ORDER — FAMOTIDINE 20 MG PO TABS: 40.0000 mg | ORAL_TABLET | Freq: Once | ORAL | Status: DC | PRN

## 2020-04-18 MED ORDER — HEPARIN SODIUM (PORCINE) 1000 UNIT/ML IJ SOLN
INTRAMUSCULAR | Status: AC
  Filled 2020-04-18: qty 1

## 2020-04-18 MED ORDER — HYDROMORPHONE HCL 1 MG/ML IJ SOLN: 1.0000 mg | Freq: Once | INTRAMUSCULAR | Status: DC | PRN

## 2020-04-18 MED ORDER — CLOPIDOGREL BISULFATE 75 MG PO TABS: 75.0000 mg | ORAL_TABLET | Freq: Every day | ORAL | 11 refills | Status: DC

## 2020-04-18 MED ORDER — MIDAZOLAM HCL 2 MG/2ML IJ SOLN
INTRAMUSCULAR | Status: DC | PRN
  Administered 2020-04-18 (×3): 1 mg via INTRAVENOUS
  Administered 2020-04-18: 2 mg via INTRAVENOUS

## 2020-04-18 MED ORDER — FENTANYL CITRATE (PF) 100 MCG/2ML IJ SOLN
INTRAMUSCULAR | Status: AC
  Filled 2020-04-18: qty 2

## 2020-04-18 MED ORDER — NITROGLYCERIN 1 MG/10 ML FOR IR/CATH LAB
INTRA_ARTERIAL | Status: DC | PRN
  Administered 2020-04-18: 300 ug

## 2020-04-18 SURGICAL SUPPLY — 30 items
BALLN DORADO 6X100X135 (BALLOONS) ×2
BALLN LUTONIX 4X150X130 (BALLOONS) ×2
BALLN LUTONIX DCB 5X100X130 (BALLOONS) ×2
BALLN LUTONIX DCB 6X100X130 (BALLOONS) ×2
BALLN ULTRVRSE 6X40X130 (BALLOONS) ×1
BALLN ULTRVRSE 6X40X130 OTE (BALLOONS) ×1
BALLOON DORADO 6X100X135 (BALLOONS) ×1 IMPLANT
BALLOON LUTONIX 4X150X130 (BALLOONS) ×1 IMPLANT
BALLOON LUTONIX DCB 5X100X130 (BALLOONS) ×1 IMPLANT
BALLOON LUTONIX DCB 6X100X130 (BALLOONS) ×1 IMPLANT
BALLOON ULTRVRSE 6X40X130 OTE (BALLOONS) ×1 IMPLANT
CANISTER PENUMBRA ENGINE (MISCELLANEOUS) ×2 IMPLANT
CATH ANGIO 5F PIGTAIL 100CM (CATHETERS) ×2 IMPLANT
CATH BEACON 5 .035 100 H1 TIP (CATHETERS) ×2 IMPLANT
CATH CXI SUPP ANG 4FR 135 (CATHETERS) ×1 IMPLANT
CATH CXI SUPP ANG 4FR 135CM (CATHETERS) ×2
CATH INDIGO CAT6 KIT (CATHETERS) ×2 IMPLANT
DEVICE PRESTO INFLATION (MISCELLANEOUS) ×2 IMPLANT
DEVICE STARCLOSE SE CLOSURE (Vascular Products) ×2 IMPLANT
GLIDEWIRE ANGLED SS 035X260CM (WIRE) ×4 IMPLANT
PACK ANGIOGRAPHY (CUSTOM PROCEDURE TRAY) ×2 IMPLANT
SHEATH BRITE TIP 5FRX11 (SHEATH) ×2 IMPLANT
SHEATH SHUTTLE SELECT 6F (SHEATH) ×2 IMPLANT
STENT LIFESTENT 5F 7X30X135 (Permanent Stent) ×2 IMPLANT
SYR MEDRAD MARK 7 150ML (SYRINGE) ×2 IMPLANT
TUBING CONTRAST HIGH PRESS 72 (TUBING) ×2 IMPLANT
VALVE CHECKFLO PERFORMER (SHEATH) ×2 IMPLANT
WIRE J 3MM .035X145CM (WIRE) ×2 IMPLANT
WIRE MAGIC TORQUE 260C (WIRE) ×2 IMPLANT
WIRE MAGIC TORQUE 315CM (WIRE) ×2 IMPLANT

## 2020-04-18 NOTE — Interval H&P Note (Signed)
History and Physical Interval Note:  04/18/2020 11:46 AM  Benjamin Stewart  has presented today for surgery, with the diagnosis of LT Upper Extremity Angiography   ESRD  Steal Syndrome   BARD Rep  cc: M Godley, S Willey   Pt to have Covid test on 04-16-20.  The various methods of treatment have been discussed with the patient and family. After consideration of risks, benefits and other options for treatment, the patient has consented to  Procedure(s): UPPER EXTREMITY ANGIOGRAPHY (Left) as a surgical intervention.  The patient's history has been reviewed, patient examined, no change in status, stable for surgery.  I have reviewed the patient's chart and labs.  Questions were answered to the patient's satisfaction.     [name]

## 2020-04-18 NOTE — Discharge Instructions (Signed)
Angiogram, Care After This sheet gives you information about how to care for yourself after your procedure. Your health care provider may also give you more specific instructions. If you have problems or questions, contact your health care provider. What can I expect after the procedure? After the procedure, it is common to have bruising and tenderness at the catheter insertion area. Follow these instructions at home: Insertion site care  Follow instructions from your health care provider about how to take care of your insertion site. Make sure you: ? Wash your hands with soap and water before you change your bandage (dressing). If soap and water are not available, use hand sanitizer. ? Change your dressing as told by your health care provider. ? Leave stitches (sutures), skin glue, or adhesive strips in place. These skin closures may need to stay in place for 2 weeks or longer. If adhesive strip edges start to loosen and curl up, you may trim the loose edges. Do not remove adhesive strips completely unless your health care provider tells you to do that.  Do not take baths, swim, or use a hot tub until your health care provider approves.  You may shower 24-48 hours after the procedure or as told by your health care provider. ? Gently wash the site with plain soap and water. ? Pat the area dry with a clean towel. ? Do not rub the site. This may cause bleeding.  Do not apply powder or lotion to the site. Keep the site clean and dry.  Check your insertion site every day for signs of infection. Check for: ? Redness, swelling, or pain. ? Fluid or blood. ? Warmth. ? Pus or a bad smell. Activity  Rest as told by your health care provider, usually for 1-2 days.  Do not lift anything that is heavier than 10 lbs. (4.5 kg) or as told by your health care provider.  Do not drive for 24 hours if you were given a medicine to help you relax (sedative).  Do not drive or use heavy machinery while  taking prescription pain medicine. General instructions   Return to your normal activities as told by your health care provider, usually in about a week. Ask your health care provider what activities are safe for you.  If the catheter site starts bleeding, lie flat and put pressure on the site. If the bleeding does not stop, get help right away. This is a medical emergency.  Drink enough fluid to keep your urine clear or pale yellow. This helps flush the contrast dye from your body.  Take over-the-counter and prescription medicines only as told by your health care provider.  Keep all follow-up visits as told by your health care provider. This is important. Contact a health care provider if:  You have a fever or chills.  You have redness, swelling, or pain around your insertion site.  You have fluid or blood coming from your insertion site.  The insertion site feels warm to the touch.  You have pus or a bad smell coming from your insertion site.  You have bruising around the insertion site.  You notice blood collecting in the tissue around the catheter site (hematoma). The hematoma may be painful to the touch. Get help right away if:  You have severe pain at the catheter insertion area.  The catheter insertion area swells very fast.  The catheter insertion area is bleeding, and the bleeding does not stop when you hold steady pressure on the area.    The area near or just beyond the catheter insertion site becomes pale, cool, tingly, or numb. These symptoms may represent a serious problem that is an emergency. Do not wait to see if the symptoms will go away. Get medical help right away. Call your local emergency services (911 in the [place] Do not drive yourself to the hospital. Summary  After the procedure, it is common to have bruising and tenderness at the catheter insertion area.  After the procedure, it is important to rest and drink plenty of fluids.  Do not take baths,  swim, or use a hot tub until your health care provider says it is okay to do so. You may shower 24-48 hours after the procedure or as told by your health care provider.  If the catheter site starts bleeding, lie flat and put pressure on the site. If the bleeding does not stop, get help right away. This is a medical emergency. This information is not intended to replace advice given to you by your health care provider. Make sure you discuss any questions you have with your health care provider. Document Revised: 07/09/2017 Document Reviewed: 07/01/2016 Elsevier Patient Education  2020 [employer]  

## 2020-04-18 NOTE — Op Note (Signed)
OPERATIVE REPORT   PREOPERATIVE DIAGNOSIS: 1. End-stage renal disease. 2. 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  POSTOPERATIVE DIAGNOSIS: Same as above the fistula actually being an ulnar cephalic AV fistula  PROCEDURE PERFORMED: 1. Ultrasound guidance vascular access to right femoral artery. 2. Catheter placement to left radial artery and left ulnar arteries  from right femoral approach. 3. Thoracic aortogram and selective left upper extremity angiogram  including selective images of the radial and ulnar arteries. 4.  Percutaneous transluminal angioplasty of the left radial artery with 4 mm diameter by 15 cm length Lutonix drug-coated angioplasty balloon 5.  Mechanical thrombectomy of the left ulnar artery with the penumbra cat 6 device 6.  Percutaneous transluminal angioplasty of the left ulnar artery at and above the antecubital fossa and the location that would be typical for the brachial artery with 5 and 6 mm diameter Lutonix drug-coated angioplasty balloons 7.  Stent placement to the left ulnar artery just above the antecubital fossa and the location typical for the brachial artery with 7 mm diameter by 3 cm length stent 8. StarClose closure device right femoral artery.  SURGEON: Algernon Huxley, MD  ANESTHESIA: Local with moderate conscious sedation for 60 minutes using 5 mg of Versed and 100 mcg of Fentanyl  BLOOD LOSS: Minimal.  FLUOROSCOPY TIME: 7.6 minutes  INDICATION FOR PROCEDURE: This is a 44 y.o.male who presented to our office with steal syndrome. The patient's left arm AV fistula is working well, but their hand is numb and painful. To further evaluate this to determine what options would be possible to treat the steal syndrome, angiogram of the left upper extremity is indicated. Risks and benefits are discussed. Informed consent was obtained.  DESCRIPTION OF PROCEDURE: The patient was brought to the  vascular suite. Moderate conscious sedation was administered during a face to face encounter with the patient throughout the procedure with my supervision of the RN administering medicines and monitoring the patient's vital signs, pulse oximetry, telemetry and mental status throughout from the start of the procedure until the patient was taken to the recovery room.  Groins were shaved and prepped and sterile surgical field was created. The right femoral head was localized with fluoroscopy and the right femoral artery was then visualized with ultrasound and found to be widely patent. It was then accessed under direct ultrasound guidance without difficulty with a Seldinger needle and a permanent image was recorded. A J-wire and 5-French sheath were then placed. Pigtail catheter was placed into the ascending aorta and a thoracic aortogram was then performed in the LAO projection. This demonstrated normal origins to the great vessels without significant proximal stenoses and a normal configuration of the great vessels. The patient was given 4000 units of intravenous heparin and a headhunter catheter was used to selectively cannulate the left subclavian artery without difficulty. The catheter was advanced to the axillary artery where imaging was performed.  There was a high bifurcation with a small radial artery and a large ulnar artery which was dominant.  The bifurcation was in the proximal upper arm.  The fistula was actually based off of the ulnar artery and was an ulnar cephalic fistula.  The small radial artery was highly diseased with multiple areas of high-grade stenosis.  The ulnar artery had thrombus and was occluded at and just proximal to the anastomosis which was highly narrowed in the arterial portion itself.  I first advanced a catheter into the radial artery where selective imaging showed greater  than 90% near occlusive stenosis near the antecubital fossa and in the proximal forearm.  I  crossed this lesion without difficulty with a Magic torque wire after placing a 6 French sheath and giving additional heparin.  A 4 mm diameter by 15 cm length Lutonix drug-coated angioplasty balloon was inflated to 12 atm for 1 minute.  Completion imaging showed less than 20% residual stenosis in the areas treated although there was some spasm proximally and distally.  Intra-arterial nitroglycerin was given.  I then turned my attention to the ulnar artery.  This was selectively cannulated with the CXI catheter and selective imaging showed thrombus and occlusion in the ulnar artery just proximal to the anastomosis of the fistula.  There was reconstitution distally and large collaterals.  I crossed the lesion without difficulty with a Magic torque wire and then made 2 passes with the penumbra cat 6 device to perform mechanical thrombectomy in the area.  This did appear to remove the thrombus, but there remained a near occlusive greater than 85% stenosis in the ulnar artery.  2 balloon inflations were performed first with a 5 mm and then with a 6 mm diameter Lutonix drug-coated angioplasty balloon taking this to 8 atm for 1 minute.  Completion imaging showed a high-grade residual stenosis in the ulnar artery just proximal to the fistula and was likely the reason for the thrombosis and initial issues.  This was present after thrombectomy and angioplasty and so I elected to place a short stent there.  I elected to use a noncovered stent because of the large collaterals in the area and trying not to impinge on flow in the anastomosis.  A 7 mm diameter by 3 cm length life stent was deployed and then postdilated with a 6 mm diameter high-pressure angioplasty balloon with about a 10 to 15% residual stenosis remaining.  There is some spasm in the vessel distally but the distal flow did appear improved.  There was nothing further to be done today.  The diagnostic catheter was removed. Oblique arteriogram was performed of  the right femoral artery and StarClose closure device deployed in the usual fashion with excellent hemostatic result. The patient tolerated the procedure well and was taken to the recovery room in stable condition.   [name] 04/18/2020 2:31 PM

## 2020-04-19 ENCOUNTER — Encounter: Payer: Self-pay | Admitting: Vascular Surgery

## 2020-04-22 ENCOUNTER — Inpatient Hospital Stay: Payer: 59 | Attending: Radiation Oncology

## 2020-04-22 ENCOUNTER — Other Ambulatory Visit: Payer: Self-pay

## 2020-04-22 DIAGNOSIS — C61 Malignant neoplasm of prostate: Secondary | ICD-10-CM | POA: Diagnosis not present

## 2020-04-22 DIAGNOSIS — R9721 Rising PSA following treatment for malignant neoplasm of prostate: Secondary | ICD-10-CM | POA: Diagnosis not present

## 2020-04-22 LAB — PSA: Prostatic Specific Antigen: 4.02 ng/mL — ABNORMAL HIGH (ref 0.00–4.00)

## 2020-04-25 ENCOUNTER — Other Ambulatory Visit: Payer: Self-pay

## 2020-04-25 ENCOUNTER — Other Ambulatory Visit: Payer: 59

## 2020-04-26 ENCOUNTER — Encounter: Payer: Self-pay | Admitting: Radiation Oncology

## 2020-04-29 ENCOUNTER — Encounter: Payer: Self-pay | Admitting: Radiation Oncology

## 2020-04-29 ENCOUNTER — Other Ambulatory Visit: Payer: Self-pay | Admitting: Licensed Clinical Social Worker

## 2020-04-29 ENCOUNTER — Ambulatory Visit
Admission: RE | Admit: 2020-04-29 | Discharge: 2020-04-29 | Disposition: A | Discharge status: Home / Self Care | Payer: 59 | Source: Ambulatory Visit | Attending: Radiation Oncology | Admitting: Radiation Oncology

## 2020-04-29 ENCOUNTER — Other Ambulatory Visit: Payer: Self-pay

## 2020-04-29 VITALS — BP 126/81 | Temp 95.7°F | Wt 275.0 lb

## 2020-04-29 DIAGNOSIS — C61 Malignant neoplasm of prostate: Secondary | ICD-10-CM

## 2020-04-29 NOTE — Progress Notes (Signed)
Radiation Oncology Follow up Note  Name: Benjamin Stewart   Date:   04/29/2020 MRN:  [id number] DOB: 1976-04-02    This 44 y.o. male presents to the clinic today for 2-1/2-year follow-up status post I-125 interstitial implant for stage IIa adenocarcinoma the prostate.  REFERRING PROVIDER: Glean Hess, MD  HPI: Patient is a 44 year old male now at 2-1/2 years having completed I-125 interstitial implant for Gleason 7 (3+4) adenocarcinoma the prostate presenting with a PSA of 8.4.  His PSA has slowly been trending upward which i has gone from 2.46 months ago to 4.0 this week.  He remains asymptomatic specifically denies any increased lower urinary tract symptoms fatigue or bone pain.  COMPLICATIONS OF TREATMENT: none  FOLLOW UP COMPLIANCE: keeps appointments   PHYSICAL EXAM:  BP 126/81 (BP Location: Right Arm, Patient Position: Sitting, Cuff Size: Large)   Temp (!) 95.7 F (35.4 C) (Tympanic)   Wt 275 lb (124.7 kg)   BMI 37.30 kg/m  Well-developed well-nourished patient in NAD. HEENT reveals PERLA, EOMI, discs not visualized.  Oral cavity is clear. No oral mucosal lesions are identified. Neck is clear without evidence of cervical or supraclavicular adenopathy. Lungs are clear to A&P. Cardiac examination is essentially unremarkable with regular rate and rhythm without murmur rub or thrill. Abdomen is benign with no organomegaly or masses noted. Motor sensory and DTR levels are equal and symmetric in the upper and lower extremities. Cranial nerves II through XII are grossly intact. Proprioception is intact. No peripheral adenopathy or edema is identified. No motor or sensory levels are noted. Crude visual fields are within normal range.  RADIOLOGY RESULTS: No current films to review view  PLAN: Present time I am concerned about his rising PSA.  He does see Dr. Erlene Quan later this week and probably will start ADT therapy.  I am also making referral to medical oncology for their input in a  young man with obvious progression biochemically of his prostate cancer.  I have otherwise asked to see him back in 6 months for follow-up.  Patient knows to call with any concerns or consultation at any time.  I would like to take this opportunity to thank you for allowing me to participate in the care of your patient.Noreene Filbert, MD

## 2020-05-02 ENCOUNTER — Ambulatory Visit: Payer: 59 | Admitting: Urology

## 2020-05-04 NOTE — Progress Notes (Signed)
05/07/2020 12:51 PM   Benjamin Stewart 1976-04-07 [id number]  Referring provider: Glean Hess, MD [address] [place],  [place] [address] Chief Complaint  Patient presents with  . Prostate Cancer    1year follow up    HPI: Benjamin Stewart is a 44 y.o. male who returns for a 1 year follow up of prostate cancer.   He has a personal history of Gleason 3+4 intermediate risk prostate cancer, initially diagnosed in 2017 on active surveillance with a PSA which subsequently rose to 8.4 at the time of treatment. He elected to pursue I-125 brachytherapy prostate seed implantcompleted on 11/2017.  Recent PSA 4.02 as of 04/22/2020.  Trend below.    Renal US from 12/30/2019 noted no acute abnormality. Echogenic kidneys bilaterally which can be seen in patients with medical renal disease. No hydronephrosis.  Patient is on dialysis 3 times/week. He is tolerating it thus far. He reports fluid restriction. He is urinating without issue.   Report intentional weight loss. He is on good diet. He is concerned that his diabetes medication may be to high and his PCP has yet to adjust medication. He reports a few episode of hypoglycemia.   He does have a strong family history of prostate cancer. His father was recently diagnosed and treated with prostate cancer. He also has several uncles with prostate cancer. He has no biological children himself.   PMH: Past Medical History:  Diagnosis Date  . Anemia   . Benign essential hematuria 12/04/2014   microscopic hematuria evaluation with CT and cysto - treated for 2 months with bactrim DS; no further f/u planned unless hematuria recurs.   . Chronic kidney disease   . Controlled type 2 diabetes mellitus without complication, without long-term current use of insulin ([place]) 07/16/2015  . COVID-19 virus infection 12/29/2019  . Essential (primary) hypertension 12/04/2014  . History of kidney stones    H/O  . prostate cancer    PROSTATE     . Sleep apnea    DOES NOT USE CPAP    Surgical History: Past Surgical History:  Procedure Laterality Date  . AV FISTULA PLACEMENT Left 03/07/2020   Procedure: ARTERIOVENOUS (AV) FISTULA CREATION BRACHIAL CEPHALIC;  Surgeon: Algernon Huxley, MD;  Location: [facility];  Service: Vascular;  Laterality: Left;  . DIALYSIS/PERMA CATHETER INSERTION N/A 01/17/2020   Procedure: DIALYSIS/PERMA CATHETER INSERTION;  Surgeon: Algernon Huxley, MD;  Location: [facility];  Service: Cardiovascular;  Laterality: N/A;  . PARTIAL COLECTOMY  2005  . RADIOACTIVE SEED IMPLANT N/A 12/06/2017   Procedure: RADIOACTIVE SEED IMPLANT/BRACHYTHERAPY IMPLANT;  Surgeon: Hollice Espy, MD;  Location: [facility];  Service: Urology;  Laterality: N/A;  . UPPER EXTREMITY ANGIOGRAPHY Left 04/18/2020   Procedure: UPPER EXTREMITY ANGIOGRAPHY;  Surgeon: Algernon Huxley, MD;  Location: [facility];  Service: Cardiovascular;  Laterality: Left;    Home Medications:  Allergies as of 05/07/2020      Reactions   Glimepiride Other (See Comments)   Severe hypoglycemia   Other    Percocet [oxycodone-acetaminophen] Nausea And Vomiting      Medication List       Accurate as of May 07, 2020 11:59 PM. If you have any questions, ask your nurse or doctor.        STOP taking these medications   HYDROcodone-acetaminophen 5-325 MG tablet Commonly known as: NORCO/VICODIN Stopped by: Hollice Espy, MD   olmesartan 40 MG tablet Commonly known as: BENICAR Stopped by:  Hollice Espy, MD     TAKE these medications   amLODipine 10 MG tablet Commonly known as: NORVASC TAKE 1 TABLET BY MOUTH EVERY DAY What changed: when to take this   aspirin EC 81 MG tablet Take 1 tablet (81 mg total) by mouth daily.   atorvastatin 10 MG tablet Commonly known as: LIPITOR TAKE 1 TABLET BY MOUTH EVERY DAY WITH BREAKFAST   calcitRIOL 0.25 MCG capsule Commonly known as: ROCALTROL Take 1 capsule (0.25 mcg total) by mouth daily.    carvedilol 6.25 MG tablet Commonly known as: COREG Take 6.25 mg by mouth 2 (two) times daily with a meal.   cloNIDine 0.2 mg/24hr patch Commonly known as: CATAPRES - Dosed in mg/24 hr Place 1 patch (0.2 mg total) onto the skin once a week. What changed: additional instructions   clopidogrel 75 MG tablet Commonly known as: Plavix Take 1 tablet (75 mg total) by mouth daily.   hydrALAZINE 100 MG tablet Commonly known as: APRESOLINE Take 100 mg by mouth 3 (three) times daily.   sevelamer carbonate 800 MG tablet Commonly known as: RENVELA Take 1 tablet (800 mg total) by mouth 2 (two) times daily with a meal.   sodium bicarbonate 650 MG tablet Take 1 tablet (650 mg total) by mouth 3 (three) times daily.   traMADol 50 MG tablet Commonly known as: ULTRAM Take 1 tablet (50 mg total) by mouth every 6 (six) hours as needed.       Allergies:  Allergies  Allergen Reactions  . Glimepiride Other (See Comments)    Severe hypoglycemia  . Other   . Percocet [Oxycodone-Acetaminophen] Nausea And Vomiting    Family History: Family History  Problem Relation Age of Onset  . Diabetes Mother   . Heart disease Mother   . Hypertension Mother   . Glaucoma Mother   . Prostate cancer Father   . Diabetes Maternal Grandmother   . Diabetes Maternal Grandfather   . Prostate cancer Maternal Uncle     Social History:  reports that he has never smoked. He has never used smokeless tobacco. He reports that he does not drink alcohol and does not use drugs.   Physical Exam: BP (!) 143/88   Pulse 91   Ht 6' (1.829 m)   Wt 266 lb (120.7 kg)   BMI 36.08 kg/m   Constitutional:  Alert and oriented, No acute distress. HEENT: [place] AT, moist mucus membranes.  Trachea midline, no masses. Cardiovascular: No clubbing, cyanosis, or edema. Respiratory: Normal respiratory effort, no increased work of breathing. Skin: No rashes, bruises or suspicious lesions. Neurologic: Grossly intact, no focal  deficits, moving all 4 extremities. Psychiatric: Normal mood and affect.  Laboratory Data:  Lab Results  Component Value Date   CREATININE 6.90 (H) 03/07/2020   PSA trend:  Component     Latest Ref Rng & Units 04/08/2018 10/14/2018 10/20/2019 04/22/2020  Prostatic Specific Antigen     0.00 - 4.00 ng/mL 2.49 1.41 2.40 4.02 (H)    Lab Results  Component Value Date   HGBA1C 5.2 11/27/2019     Assessment & Plan:    1. History of prostate cancer S/p Brachytherapy 2019 Rising PSA is concerning for biochemical occurrence. Repeat PSA today, will call with results .  If PSA continues to rise I will have patient undergo restaging with a CT scan with contrast and a bone scan. I will consider initiation of ADT with possible addition of anti-androgen depending on imaging. Follow up in 1 month.  Return in about 4 weeks (around 06/04/2020) for 31mo w/scan results.  [facility] [address], [place], [place] [address] [phone]  I, Selena Batten, am acting as a scribe for Dr. Hollice Espy.  I have reviewed the above documentation for accuracy and completeness, and I agree with the above.   Hollice Espy, MD

## 2020-05-07 ENCOUNTER — Ambulatory Visit (INDEPENDENT_AMBULATORY_CARE_PROVIDER_SITE_OTHER): Payer: 59 | Admitting: Urology

## 2020-05-07 ENCOUNTER — Encounter: Payer: Self-pay | Admitting: Urology

## 2020-05-07 ENCOUNTER — Other Ambulatory Visit: Payer: Self-pay

## 2020-05-07 VITALS — BP 143/88 | HR 91 | Ht 72.0 in | Wt 266.0 lb

## 2020-05-07 DIAGNOSIS — R9721 Rising PSA following treatment for malignant neoplasm of prostate: Secondary | ICD-10-CM

## 2020-05-07 DIAGNOSIS — Z8546 Personal history of malignant neoplasm of prostate: Secondary | ICD-10-CM | POA: Diagnosis not present

## 2020-05-08 ENCOUNTER — Telehealth: Payer: Self-pay

## 2020-05-08 LAB — PSA: Prostate Specific Ag, Serum: 3.8 ng/mL (ref 0.0–4.0)

## 2020-05-08 NOTE — Telephone Encounter (Signed)
-----   Message from Hollice Espy, MD sent at 05/08/2020  1:01 PM EDT ----- PSA is actually going down, in the right direction.  Yesterday's repeat value was 3.8.  This is not quite yet reached the definition of biochemical recurrence and thus I think we should hold off on doing the CT scans and bone scans that we discussed.  I like to see him back in 4 months with another repeat PSA to see how you are doing.  Please schedule this visit with a PSA prior and cancel his imaging studies for now.  Hollice Espy, MD

## 2020-05-08 NOTE — Addendum Note (Signed)
Addended by: Alvera Novel on: 05/08/2020 04:18 PM   Modules accepted: Orders

## 2020-05-10 ENCOUNTER — Ambulatory Visit (INDEPENDENT_AMBULATORY_CARE_PROVIDER_SITE_OTHER): Payer: 59 | Admitting: Vascular Surgery

## 2020-05-10 NOTE — Telephone Encounter (Signed)
-----   Message from Hollice Espy, MD sent at 05/08/2020  1:01 PM EDT ----- PSA is actually going down, in the right direction.  Yesterday's repeat value was 3.8.  This is not quite yet reached the definition of biochemical recurrence and thus I think we should hold off on doing the CT scans and bone scans that we discussed.  I like to see him back in 4 months with another repeat PSA to see how you are doing.  Please schedule this visit with a PSA prior and cancel his imaging studies for now.  Hollice Espy, MD

## 2020-05-10 NOTE — Telephone Encounter (Signed)
Left pt mess, mychart notification sent

## 2020-05-13 NOTE — Telephone Encounter (Signed)
Last read by [name] at 3:18 PM on 05/10/2020. Pt read mychart message

## 2020-05-16 ENCOUNTER — Other Ambulatory Visit: Payer: Self-pay

## 2020-05-16 ENCOUNTER — Ambulatory Visit (INDEPENDENT_AMBULATORY_CARE_PROVIDER_SITE_OTHER): Payer: 59

## 2020-05-16 ENCOUNTER — Ambulatory Visit (INDEPENDENT_AMBULATORY_CARE_PROVIDER_SITE_OTHER): Payer: Medicare Other | Admitting: Nurse Practitioner

## 2020-05-16 ENCOUNTER — Other Ambulatory Visit (INDEPENDENT_AMBULATORY_CARE_PROVIDER_SITE_OTHER): Payer: Self-pay | Admitting: Nurse Practitioner

## 2020-05-16 VITALS — BP 149/90 | HR 89 | Ht 72.0 in | Wt 259.0 lb

## 2020-05-16 DIAGNOSIS — I10 Essential (primary) hypertension: Secondary | ICD-10-CM

## 2020-05-16 DIAGNOSIS — E1169 Type 2 diabetes mellitus with other specified complication: Secondary | ICD-10-CM

## 2020-05-16 DIAGNOSIS — Z95820 Peripheral vascular angioplasty status with implants and grafts: Secondary | ICD-10-CM

## 2020-05-16 DIAGNOSIS — M79642 Pain in left hand: Secondary | ICD-10-CM | POA: Diagnosis not present

## 2020-05-16 DIAGNOSIS — E785 Hyperlipidemia, unspecified: Secondary | ICD-10-CM

## 2020-05-16 DIAGNOSIS — N186 End stage renal disease: Secondary | ICD-10-CM

## 2020-05-16 DIAGNOSIS — E118 Type 2 diabetes mellitus with unspecified complications: Secondary | ICD-10-CM

## 2020-05-21 ENCOUNTER — Encounter (INDEPENDENT_AMBULATORY_CARE_PROVIDER_SITE_OTHER): Payer: Self-pay | Admitting: Nurse Practitioner

## 2020-05-21 NOTE — Progress Notes (Signed)
Subjective:    Patient ID: Benjamin Stewart, male    DOB: 1975/08/27, 44 y.o.   MRN: [id number] Chief Complaint  Patient presents with  . 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  The patient underwent extensive intervention including:  PROCEDURE PERFORMED: 1. Ultrasound guidance vascular access to right femoral artery. 2. Catheter placement to left radial artery and left ulnar arteries  from right femoral approach. 3. Thoracic aortogram and selective left upper extremity angiogram  including selective images of the radial and ulnar arteries. 4.  Percutaneous transluminal angioplasty of the left radial artery with 4 mm diameter by 15 cm length Lutonix drug-coated angioplasty balloon 5.  Mechanical thrombectomy of the left ulnar artery with the penumbra cat 6 device 6.  Percutaneous transluminal angioplasty of the left ulnar artery at and above the antecubital fossa and the location that would be typical for the brachial artery with 5 and 6 mm diameter Lutonix drug-coated angioplasty balloons 7.  Stent placement to the left ulnar artery just above the antecubital fossa and the location typical for the brachial artery with 7 mm diameter by 3 cm length stent 8. StarClose closure device right femoral artery.  While the pain is decreasing the patient hand he still continues to have some numbness in his left hand.  However it is not as extensive as previous.  Today noninvasive studies show a greatly improved flow volume of 2922.  This is a great improvement from his previous flow volume of 878.  However, the patient has a difficult to palpate thrill.  Subsequent imaging shows that the patient has a very large collateral running adjacent to his fistula.  This large collateral may be affecting the ability for his current access to be palpated.  Once his access is easily palpable he should  be able to begin utilizing it for dialysis.   Review of Systems  Neurological: Positive for numbness.  All other systems reviewed and are negative.      Objective:   Physical Exam Vitals reviewed.  HENT:     Head: Normocephalic.  Cardiovascular:     Rate and Rhythm: Normal rate.     Pulses: Normal pulses.     Arteriovenous access: left arteriovenous access is present.    Comments: Good bruit but hard to feel thrill due to large collateral running parallel  Pulmonary:     Effort: Pulmonary effort is normal.  Neurological:     Mental Status: He is alert and oriented to person, place, and time.  Psychiatric:        Mood and Affect: Mood normal.        Behavior: Behavior normal.        Thought Content: Thought content normal.        Judgment: Judgment normal.     BP (!) 149/90   Pulse 89   Ht 6' (1.829 m)   Wt 259 lb (117.5 kg)   BMI 35.13 kg/m   Past Medical History:  Diagnosis Date  . Anemia   . Benign essential hematuria 12/04/2014   microscopic hematuria evaluation with CT and cysto - treated for 2 months with bactrim DS; no further f/u planned unless hematuria recurs.   . Chronic kidney disease   . Controlled type 2 diabetes mellitus without complication, without long-term current use of insulin ([place]) 07/16/2015  . COVID-19 virus infection 12/29/2019  .  Essential (primary) hypertension 12/04/2014  . History of kidney stones    H/O  . prostate cancer    PROSTATE   . Sleep apnea    DOES NOT USE CPAP    Social History   Socioeconomic History  . Marital status: Divorced    Spouse name: Not on file  . Number of children: Not on file  . Years of education: Not on file  . Highest education level: Not on file  Occupational History  . Not on file  Tobacco Use  . Smoking status: Never Smoker  . Smokeless tobacco: Never Used  Vaping Use  . Vaping Use: Never used  Substance and Sexual Activity  . Alcohol use: No    Alcohol/week: 0.0 standard drinks  . Drug  use: No  . Sexual activity: Not on file  Other Topics Concern  . Not on file  Social History Narrative  . Not on file   Social Determinants of Health   Financial Resource Strain:   . Difficulty of Paying Living Expenses: Not on file  Food Insecurity:   . Worried About Charity fundraiser in the Last Year: Not on file  . Ran Out of Food in the Last Year: Not on file  Transportation Needs:   . Lack of Transportation (Medical): Not on file  . Lack of Transportation (Non-Medical): Not on file  Physical Activity:   . Days of Exercise per Week: Not on file  . Minutes of Exercise per Session: Not on file  Stress:   . Feeling of Stress : Not on file  Social Connections:   . Frequency of Communication with Friends and Family: Not on file  . Frequency of Social Gatherings with Friends and Family: Not on file  . Attends Religious Services: Not on file  . Active Member of Clubs or Organizations: Not on file  . Attends Archivist Meetings: Not on file  . Marital Status: Not on file  Intimate Partner Violence:   . Fear of Current or Ex-Partner: Not on file  . Emotionally Abused: Not on file  . Physically Abused: Not on file  . Sexually Abused: Not on file    Past Surgical History:  Procedure Laterality Date  . AV FISTULA PLACEMENT Left 03/07/2020   Procedure: ARTERIOVENOUS (AV) FISTULA CREATION BRACHIAL CEPHALIC;  Surgeon: Algernon Huxley, MD;  Location: [facility];  Service: Vascular;  Laterality: Left;  . DIALYSIS/PERMA CATHETER INSERTION N/A 01/17/2020   Procedure: DIALYSIS/PERMA CATHETER INSERTION;  Surgeon: Algernon Huxley, MD;  Location: [facility];  Service: Cardiovascular;  Laterality: N/A;  . PARTIAL COLECTOMY  2005  . RADIOACTIVE SEED IMPLANT N/A 12/06/2017   Procedure: RADIOACTIVE SEED IMPLANT/BRACHYTHERAPY IMPLANT;  Surgeon: Hollice Espy, MD;  Location: [facility];  Service: Urology;  Laterality: N/A;  . UPPER EXTREMITY ANGIOGRAPHY Left 04/18/2020   Procedure: UPPER  EXTREMITY ANGIOGRAPHY;  Surgeon: Algernon Huxley, MD;  Location: [facility];  Service: Cardiovascular;  Laterality: Left;    Family History  Problem Relation Age of Onset  . Diabetes Mother   . Heart disease Mother   . Hypertension Mother   . Glaucoma Mother   . Prostate cancer Father   . Diabetes Maternal Grandmother   . Diabetes Maternal Grandfather   . Prostate cancer Maternal Uncle     Allergies  Allergen Reactions  . Glimepiride Other (See Comments)    Severe hypoglycemia  . Other   . Percocet [Oxycodone-Acetaminophen] Nausea And Vomiting  Assessment & Plan:   1. End stage renal disease ([place]) Recommend:  The patient is experiencing increasing problems with their dialysis access.  Patient should have a fistulagram with the intention for intervention.  The intention for intervention is to restore appropriate flow and prevent thrombosis and possible loss of the access.  As well as improve the quality of dialysis therapy.  The risks, benefits and alternative therapies were reviewed in detail with the patient.  All questions were answered.  The patient agrees to proceed with angio/intervention.      2. Essential (primary) hypertension Continue antihypertensive medications as already ordered, these medications have been reviewed and there are no changes at this time.   3. Type II diabetes mellitus with complication (HCC) Continue hypoglycemic medications as already ordered, these medications have been reviewed and there are no changes at this time.  Hgb A1C to be monitored as already arranged by primary service   4. Hyperlipidemia associated with type 2 diabetes mellitus ([place]) Continue statin as ordered and reviewed, no changes at this time    Current Outpatient Medications on File Prior to Visit  Medication Sig Dispense Refill  . amLODipine (NORVASC) 10 MG tablet TAKE 1 TABLET BY MOUTH EVERY DAY (Patient taking differently: Take 10 mg by mouth every  morning. ) 90 tablet 1  . aspirin EC 81 MG tablet Take 1 tablet (81 mg total) by mouth daily. 150 tablet 2  . atorvastatin (LIPITOR) 10 MG tablet TAKE 1 TABLET BY MOUTH EVERY DAY WITH BREAKFAST 90 tablet 3  . calcitRIOL (ROCALTROL) 0.25 MCG capsule Take 1 capsule (0.25 mcg total) by mouth daily. 90 capsule 1  . carvedilol (COREG) 6.25 MG tablet Take 6.25 mg by mouth 2 (two) times daily with a meal.     . cloNIDine (CATAPRES - DOSED IN MG/24 HR) 0.2 mg/24hr patch Place 1 patch (0.2 mg total) onto the skin once a week. (Patient taking differently: Place 0.2 mg onto the skin once a week. LAST CHANGED ON 03-05-20) 4 patch 6  . clopidogrel (PLAVIX) 75 MG tablet Take 1 tablet (75 mg total) by mouth daily. 30 tablet 11  . hydrALAZINE (APRESOLINE) 100 MG tablet Take 100 mg by mouth 3 (three) times daily.    . sevelamer carbonate (RENVELA) 800 MG tablet Take 1 tablet (800 mg total) by mouth 2 (two) times daily with a meal. 60 tablet 1  . sodium bicarbonate 650 MG tablet Take 1 tablet (650 mg total) by mouth 3 (three) times daily. 90 tablet 1  . traMADol (ULTRAM) 50 MG tablet Take 1 tablet (50 mg total) by mouth every 6 (six) hours as needed. 25 tablet 0   No current facility-administered medications on file prior to visit.    There are no Patient Instructions on file for this visit. No follow-ups on file.   Kris Hartmann, NP

## 2020-05-21 NOTE — H&P (View-Only) (Signed)
Subjective:    Patient ID: Benjamin Stewart, male    DOB: May 31, 1976, 44 y.o.   MRN: [id number] Chief Complaint  Patient presents with  . 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  The patient underwent extensive intervention including:  PROCEDURE PERFORMED: 1. Ultrasound guidance vascular access to right femoral artery. 2. Catheter placement to left radial artery and left ulnar arteries  from right femoral approach. 3. Thoracic aortogram and selective left upper extremity angiogram  including selective images of the radial and ulnar arteries. 4.  Percutaneous transluminal angioplasty of the left radial artery with 4 mm diameter by 15 cm length Lutonix drug-coated angioplasty balloon 5.  Mechanical thrombectomy of the left ulnar artery with the penumbra cat 6 device 6.  Percutaneous transluminal angioplasty of the left ulnar artery at and above the antecubital fossa and the location that would be typical for the brachial artery with 5 and 6 mm diameter Lutonix drug-coated angioplasty balloons 7.  Stent placement to the left ulnar artery just above the antecubital fossa and the location typical for the brachial artery with 7 mm diameter by 3 cm length stent 8. StarClose closure device right femoral artery.  While the pain is decreasing the patient hand he still continues to have some numbness in his left hand.  However it is not as extensive as previous.  Today noninvasive studies show a greatly improved flow volume of 2922.  This is a great improvement from his previous flow volume of 878.  However, the patient has a difficult to palpate thrill.  Subsequent imaging shows that the patient has a very large collateral running adjacent to his fistula.  This large collateral may be affecting the ability for his current access to be palpated.  Once his access is easily palpable he should  be able to begin utilizing it for dialysis.   Review of Systems  Neurological: Positive for numbness.  All other systems reviewed and are negative.      Objective:   Physical Exam Vitals reviewed.  HENT:     Head: Normocephalic.  Cardiovascular:     Rate and Rhythm: Normal rate.     Pulses: Normal pulses.     Arteriovenous access: left arteriovenous access is present.    Comments: Good bruit but hard to feel thrill due to large collateral running parallel  Pulmonary:     Effort: Pulmonary effort is normal.  Neurological:     Mental Status: He is alert and oriented to person, place, and time.  Psychiatric:        Mood and Affect: Mood normal.        Behavior: Behavior normal.        Thought Content: Thought content normal.        Judgment: Judgment normal.     BP (!) 149/90   Pulse 89   Ht 6' (1.829 m)   Wt 259 lb (117.5 kg)   BMI 35.13 kg/m   Past Medical History:  Diagnosis Date  . Anemia   . Benign essential hematuria 12/04/2014   microscopic hematuria evaluation with CT and cysto - treated for 2 months with bactrim DS; no further f/u planned unless hematuria recurs.   . Chronic kidney disease   . Controlled type 2 diabetes mellitus without complication, without long-term current use of insulin ([place]) 07/16/2015  . COVID-19 virus infection 12/29/2019  .  Essential (primary) hypertension 12/04/2014  . History of kidney stones    H/O  . prostate cancer    PROSTATE   . Sleep apnea    DOES NOT USE CPAP    Social History   Socioeconomic History  . Marital status: Divorced    Spouse name: Not on file  . Number of children: Not on file  . Years of education: Not on file  . Highest education level: Not on file  Occupational History  . Not on file  Tobacco Use  . Smoking status: Never Smoker  . Smokeless tobacco: Never Used  Vaping Use  . Vaping Use: Never used  Substance and Sexual Activity  . Alcohol use: No    Alcohol/week: 0.0 standard drinks  . Drug  use: No  . Sexual activity: Not on file  Other Topics Concern  . Not on file  Social History Narrative  . Not on file   Social Determinants of Health   Financial Resource Strain:   . Difficulty of Paying Living Expenses: Not on file  Food Insecurity:   . Worried About Charity fundraiser in the Last Year: Not on file  . Ran Out of Food in the Last Year: Not on file  Transportation Needs:   . Lack of Transportation (Medical): Not on file  . Lack of Transportation (Non-Medical): Not on file  Physical Activity:   . Days of Exercise per Week: Not on file  . Minutes of Exercise per Session: Not on file  Stress:   . Feeling of Stress : Not on file  Social Connections:   . Frequency of Communication with Friends and Family: Not on file  . Frequency of Social Gatherings with Friends and Family: Not on file  . Attends Religious Services: Not on file  . Active Member of Clubs or Organizations: Not on file  . Attends Archivist Meetings: Not on file  . Marital Status: Not on file  Intimate Partner Violence:   . Fear of Current or Ex-Partner: Not on file  . Emotionally Abused: Not on file  . Physically Abused: Not on file  . Sexually Abused: Not on file    Past Surgical History:  Procedure Laterality Date  . AV FISTULA PLACEMENT Left 03/07/2020   Procedure: ARTERIOVENOUS (AV) FISTULA CREATION BRACHIAL CEPHALIC;  Surgeon: Algernon Huxley, MD;  Location: [facility];  Service: Vascular;  Laterality: Left;  . DIALYSIS/PERMA CATHETER INSERTION N/A 01/17/2020   Procedure: DIALYSIS/PERMA CATHETER INSERTION;  Surgeon: Algernon Huxley, MD;  Location: [place] [facility];  Service: Cardiovascular;  Laterality: N/A;  . PARTIAL COLECTOMY  2005  . RADIOACTIVE SEED IMPLANT N/A 12/06/2017   Procedure: RADIOACTIVE SEED IMPLANT/BRACHYTHERAPY IMPLANT;  Surgeon: Hollice Espy, MD;  Location: [facility];  Service: Urology;  Laterality: N/A;  . UPPER EXTREMITY ANGIOGRAPHY Left 04/18/2020   Procedure: UPPER  EXTREMITY ANGIOGRAPHY;  Surgeon: Algernon Huxley, MD;  Location: [place] [facility];  Service: Cardiovascular;  Laterality: Left;    Family History  Problem Relation Age of Onset  . Diabetes Mother   . Heart disease Mother   . Hypertension Mother   . Glaucoma Mother   . Prostate cancer Father   . Diabetes Maternal Grandmother   . Diabetes Maternal Grandfather   . Prostate cancer Maternal Uncle     Allergies  Allergen Reactions  . Glimepiride Other (See Comments)    Severe hypoglycemia  . Other   . Percocet [Oxycodone-Acetaminophen] Nausea And Vomiting  Assessment & Plan:   1. End stage renal disease ([place]) Recommend:  The patient is experiencing increasing problems with their dialysis access.  Patient should have a fistulagram with the intention for intervention.  The intention for intervention is to restore appropriate flow and prevent thrombosis and possible loss of the access.  As well as improve the quality of dialysis therapy.  The risks, benefits and alternative therapies were reviewed in detail with the patient.  All questions were answered.  The patient agrees to proceed with angio/intervention.      2. Essential (primary) hypertension Continue antihypertensive medications as already ordered, these medications have been reviewed and there are no changes at this time.   3. Type II diabetes mellitus with complication (HCC) Continue hypoglycemic medications as already ordered, these medications have been reviewed and there are no changes at this time.  Hgb A1C to be monitored as already arranged by primary service   4. Hyperlipidemia associated with type 2 diabetes mellitus ([place]) Continue statin as ordered and reviewed, no changes at this time    Current Outpatient Medications on File Prior to Visit  Medication Sig Dispense Refill  . amLODipine (NORVASC) 10 MG tablet TAKE 1 TABLET BY MOUTH EVERY DAY (Patient taking differently: Take 10 mg by mouth every  morning. ) 90 tablet 1  . aspirin EC 81 MG tablet Take 1 tablet (81 mg total) by mouth daily. 150 tablet 2  . atorvastatin (LIPITOR) 10 MG tablet TAKE 1 TABLET BY MOUTH EVERY DAY WITH BREAKFAST 90 tablet 3  . calcitRIOL (ROCALTROL) 0.25 MCG capsule Take 1 capsule (0.25 mcg total) by mouth daily. 90 capsule 1  . carvedilol (COREG) 6.25 MG tablet Take 6.25 mg by mouth 2 (two) times daily with a meal.     . cloNIDine (CATAPRES - DOSED IN MG/24 HR) 0.2 mg/24hr patch Place 1 patch (0.2 mg total) onto the skin once a week. (Patient taking differently: Place 0.2 mg onto the skin once a week. LAST CHANGED ON 03-05-20) 4 patch 6  . clopidogrel (PLAVIX) 75 MG tablet Take 1 tablet (75 mg total) by mouth daily. 30 tablet 11  . hydrALAZINE (APRESOLINE) 100 MG tablet Take 100 mg by mouth 3 (three) times daily.    . sevelamer carbonate (RENVELA) 800 MG tablet Take 1 tablet (800 mg total) by mouth 2 (two) times daily with a meal. 60 tablet 1  . sodium bicarbonate 650 MG tablet Take 1 tablet (650 mg total) by mouth 3 (three) times daily. 90 tablet 1  . traMADol (ULTRAM) 50 MG tablet Take 1 tablet (50 mg total) by mouth every 6 (six) hours as needed. 25 tablet 0   No current facility-administered medications on file prior to visit.    There are no Patient Instructions on file for this visit. No follow-ups on file.   Kris Hartmann, NP

## 2020-05-22 ENCOUNTER — Telehealth (INDEPENDENT_AMBULATORY_CARE_PROVIDER_SITE_OTHER): Payer: Self-pay

## 2020-05-22 NOTE — Telephone Encounter (Signed)
Spoke with the patient and he is scheduled with Dr. Lucky Cowboy for a LUE fistulagram poss. Coil on 05/30/20 with a 7:45 am arrival time to the [facility]. Covid testing on 05/28/20 between 8-1 pm at the [facility]. Pre-procedure instructions were discussed and will be mailed.

## 2020-05-23 ENCOUNTER — Encounter: Payer: Self-pay | Admitting: Internal Medicine

## 2020-05-23 ENCOUNTER — Ambulatory Visit (INDEPENDENT_AMBULATORY_CARE_PROVIDER_SITE_OTHER): Payer: 59 | Admitting: Internal Medicine

## 2020-05-23 ENCOUNTER — Other Ambulatory Visit: Payer: Self-pay

## 2020-05-23 ENCOUNTER — Other Ambulatory Visit: Payer: Self-pay | Admitting: Internal Medicine

## 2020-05-23 VITALS — BP 128/70 | HR 90 | Temp 98.7°F | Ht 72.0 in | Wt 260.0 lb

## 2020-05-23 DIAGNOSIS — I10 Essential (primary) hypertension: Secondary | ICD-10-CM | POA: Diagnosis not present

## 2020-05-23 DIAGNOSIS — N186 End stage renal disease: Secondary | ICD-10-CM | POA: Diagnosis not present

## 2020-05-23 DIAGNOSIS — Z992 Dependence on renal dialysis: Secondary | ICD-10-CM | POA: Diagnosis not present

## 2020-05-23 DIAGNOSIS — E1122 Type 2 diabetes mellitus with diabetic chronic kidney disease: Secondary | ICD-10-CM

## 2020-05-23 MED ORDER — AMLODIPINE BESYLATE 10 MG PO TABS: 10.0000 mg | ORAL_TABLET | Freq: Every day | ORAL | 1 refills | Status: DC

## 2020-05-23 NOTE — Progress Notes (Signed)
Date:  05/23/2020   Name:  Benjamin Stewart   DOB:  08/28/1975   MRN:  [id number]   Chief Complaint: Diabetes (Follow up.) and Hypertension (Follow up.)  Diabetes He presents for his follow-up diabetic visit. He has type 2 diabetes mellitus. His disease course has been improving (he has lost 60 lbs since starting HD and stopped all medications). Pertinent negatives for hypoglycemia include no dizziness, headaches or nervousness/anxiousness. Pertinent negatives for diabetes include no chest pain and no fatigue. Symptoms are resolved. Current diabetic treatment includes diet. His weight is decreasing steadily. There is no compliance with monitoring of blood glucose.  Hypertension This is a chronic problem. The problem is controlled. Pertinent negatives include no chest pain, headaches or shortness of breath. Past treatments include calcium channel blockers, beta blockers, central alpha agonists and direct vasodilators. The current treatment provides significant improvement. There are no compliance problems.  Hypertensive end-organ damage includes kidney disease (now on HD).    Lab Results  Component Value Date   CREATININE 6.90 (H) 03/07/2020   BUN 21 (H) 03/07/2020   NA 139 03/07/2020   K 3.4 (L) 03/07/2020   CL 97 (L) 03/07/2020   CO2 25 03/06/2020   Lab Results  Component Value Date   CHOL 101 08/21/2019   HDL 25 (L) 08/21/2019   LDLCALC 59 08/21/2019   TRIG 86 08/21/2019   CHOLHDL 4.0 08/21/2019   Lab Results  Component Value Date   TSH 1.173 11/09/2019   Lab Results  Component Value Date   HGBA1C 5.2 11/27/2019   Lab Results  Component Value Date   WBC 7.9 03/06/2020   HGB 8.2 (L) 03/07/2020   HCT 24.0 (L) 03/07/2020   MCV 90.4 03/06/2020   PLT 331 03/06/2020   Lab Results  Component Value Date   ALT 17 11/09/2019   AST 32 11/09/2019   ALKPHOS 37 (L) 11/09/2019   BILITOT 0.6 11/09/2019     Review of Systems  Constitutional: Positive for unexpected weight  change. Negative for chills, fatigue and fever.  Respiratory: Negative for chest tightness and shortness of breath.   Cardiovascular: Negative for chest pain and leg swelling.  Gastrointestinal: Negative for abdominal pain.  Neurological: Negative for dizziness and headaches.  Psychiatric/Behavioral: Negative for dysphoric mood and sleep disturbance. The patient is not nervous/anxious.     Patient Active Problem List   Diagnosis Date Noted   End stage renal disease ([place]) 04/11/2020   Anemia 12/29/2019   CKD (chronic kidney disease) stage V requiring chronic dialysis ([place]) 11/09/2019   Postural dizziness with presyncope 11/09/2019   Secondary hyperparathyroidism of renal origin ([place]) 04/24/2019   Hyperkalemia 04/24/2019   Localized edema 01/19/2019   Chronic gout of multiple sites 08/16/2018   Proteinuria due to type 2 diabetes mellitus ([place]) 08/22/2017   Prostate cancer ([place]) 08/23/2015   Type II diabetes mellitus with complication ([place]) 25/95/6387   Hyperlipidemia associated with type 2 diabetes mellitus ([place]) 07/16/2015   Essential (primary) hypertension 12/04/2014   Morbid obesity, unspecified obesity type ([place]) 12/04/2014    Allergies  Allergen Reactions   Glimepiride Other (See Comments)    Severe hypoglycemia   Other    Percocet [Oxycodone-Acetaminophen] Nausea And Vomiting    Past Surgical History:  Procedure Laterality Date   AV FISTULA PLACEMENT Left 03/07/2020   Procedure: ARTERIOVENOUS (AV) FISTULA CREATION BRACHIAL CEPHALIC;  Surgeon: Algernon Huxley, MD;  Location: [facility];  Service: Vascular;  Laterality: Left;   DIALYSIS/PERMA CATHETER  INSERTION N/A 01/17/2020   Procedure: DIALYSIS/PERMA CATHETER INSERTION;  Surgeon: Algernon Huxley, MD;  Location: [place] [facility];  Service: Cardiovascular;  Laterality: N/A;   PARTIAL COLECTOMY  2005   RADIOACTIVE SEED IMPLANT N/A 12/06/2017   Procedure: RADIOACTIVE SEED IMPLANT/BRACHYTHERAPY IMPLANT;   Surgeon: Hollice Espy, MD;  Location: [facility];  Service: Urology;  Laterality: N/A;   UPPER EXTREMITY ANGIOGRAPHY Left 04/18/2020   Procedure: UPPER EXTREMITY ANGIOGRAPHY;  Surgeon: Algernon Huxley, MD;  Location: [place] [facility];  Service: Cardiovascular;  Laterality: Left;    Social History   Tobacco Use   Smoking status: Never Smoker   Smokeless tobacco: Never Used  Vaping Use   Vaping Use: Never used  Substance Use Topics   Alcohol use: No    Alcohol/week: 0.0 standard drinks   Drug use: No     Medication list has been reviewed and updated.  Current Meds  Medication Sig   amLODipine (NORVASC) 10 MG tablet TAKE 1 TABLET BY MOUTH EVERY DAY (Patient taking differently: Take 10 mg by mouth every morning. )   aspirin EC 81 MG tablet Take 1 tablet (81 mg total) by mouth daily.   atorvastatin (LIPITOR) 10 MG tablet TAKE 1 TABLET BY MOUTH EVERY DAY WITH BREAKFAST   calcitRIOL (ROCALTROL) 0.25 MCG capsule TAKE 1 CAPSULE(0.25 MCG) BY MOUTH DAILY   carvedilol (COREG) 6.25 MG tablet Take 6.25 mg by mouth 2 (two) times daily with a meal.    cloNIDine (CATAPRES - DOSED IN MG/24 HR) 0.2 mg/24hr patch Place 1 patch (0.2 mg total) onto the skin once a week. (Patient taking differently: Place 0.2 mg onto the skin once a week. LAST CHANGED ON 03-05-20)   clopidogrel (PLAVIX) 75 MG tablet Take 1 tablet (75 mg total) by mouth daily.   hydrALAZINE (APRESOLINE) 100 MG tablet Take 100 mg by mouth 3 (three) times daily.   sevelamer carbonate (RENVELA) 800 MG tablet Take 1 tablet (800 mg total) by mouth 2 (two) times daily with a meal.   sodium bicarbonate 650 MG tablet Take 1 tablet (650 mg total) by mouth 3 (three) times daily.   traMADol (ULTRAM) 50 MG tablet Take 1 tablet (50 mg total) by mouth every 6 (six) hours as needed.    PHQ 2/9 Scores 05/23/2020 01/09/2020 11/27/2019 08/21/2019  PHQ - 2 Score 0 0 0 0  PHQ- 9 Score 0 0 2 0    GAD 7 : Generalized Anxiety Score 05/23/2020  01/09/2020 11/27/2019  Nervous, Anxious, on Edge 0 0 0  Control/stop worrying 0 0 0  Worry too much - different things 0 0 0  Trouble relaxing 0 0 0  Restless 0 0 0  Easily annoyed or irritable 0 0 0  Afraid - awful might happen 0 0 0  Total GAD 7 Score 0 0 0  Anxiety Difficulty Not difficult at all Not difficult at all Not difficult at all    BP Readings from Last 3 Encounters:  05/23/20 128/70  05/16/20 (!) 149/90  05/07/20 (!) 143/88    Physical Exam Vitals and nursing note reviewed.  Constitutional:      General: He is not in acute distress.    Appearance: He is well-developed.  HENT:     Head: Normocephalic and atraumatic.  Neck:     Vascular: No carotid bruit.  Cardiovascular:     Rate and Rhythm: Normal rate and regular rhythm.     Pulses: Normal pulses.  Pulmonary:  Effort: Pulmonary effort is normal. No respiratory distress.     Breath sounds: No wheezing or rhonchi.  Musculoskeletal:     Cervical back: Normal range of motion.     Right lower leg: No edema.     Left lower leg: No edema.  Lymphadenopathy:     Cervical: No cervical adenopathy.  Skin:    General: Skin is warm and dry.     Findings: No rash.  Neurological:     Mental Status: He is alert and oriented to person, place, and time.  Psychiatric:        Behavior: Behavior normal.        Thought Content: Thought content normal.     Wt Readings from Last 3 Encounters:  05/23/20 260 lb (117.9 kg)  05/16/20 259 lb (117.5 kg)  05/07/20 266 lb (120.7 kg)    BP 128/70    Pulse 90    Temp 98.7 F (37.1 C) (Oral)    Ht 6' (1.829 m)    Wt 260 lb (117.9 kg)    SpO2 98%    BMI 35.26 kg/m   Assessment and Plan: 1. Essential (primary) hypertension Clinically stable exam with well controlled BP on amlodipine, coreg, catapres patches and hydralazine. Tolerating medications without side effects at this time. Pt to continue current regimen and low sodium diet; benefits of regular exercise as able  discussed. Amlodipine refilled  2. Type 2 diabetes mellitus with chronic kidney disease on chronic dialysis, without long-term current use of insulin (HCC) DM is now considered resolved. He is no longer on medication and A1C is normal. He will continue to get labs through HD Will check lipids next visit.   Partially dictated using Editor, commissioning. Any errors are unintentional.  Halina Maidens, MD [facility]  05/23/2020

## 2020-05-23 NOTE — Telephone Encounter (Signed)
Requested medication (s) are due for refill today: Yes  Requested medication (s) are on the active medication list: Yes  Last refill:  11/20/19  Future visit scheduled: Yes  Notes to clinic:  See request.    Requested Prescriptions  Pending Prescriptions Disp Refills   calcitRIOL (ROCALTROL) 0.25 MCG capsule [Pharmacy Med Name: CALCITRIOL 0.25MCG CAPSULES] 90 capsule 1    Sig: TAKE 1 CAPSULE(0.25 MCG) BY MOUTH DAILY      Endocrinology:  Vitamins - Vitamin D Supplementation Failed - 05/23/2020  3:41 AM      Failed - 50,000 IU strengths are not delegated      Failed - Phosphate in normal range and within 360 days    Phosphorus  Date Value Ref Range Status  01/01/2020 6.1 (H) 2.5 - 4.6 mg/dL Final          Failed - Vitamin D in normal range and within 360 days    No results found for: [id number], [id number], [id number], 25OHVITD3, 25OHVITD2, 25OHVITD3, 25OHVITD2, 25OHVITD1, 25OHVITD2, 25OHVITD3, VD25OH        Passed - Ca in normal range and within 360 days    Calcium  Date Value Ref Range Status  03/06/2020 9.3 8.9 - 10.3 mg/dL Final   Calcium, Total  Date Value Ref Range Status  02/10/2012 8.8 8.5 - 10.1 mg/dL Final   Calcium, Ion  Date Value Ref Range Status  03/07/2020 1.21 1.15 - 1.40 mmol/L Final          Passed - Valid encounter within last 12 months    Recent Outpatient Visits           4 months ago Type II diabetes mellitus with complication [facility])   [facility] Glean Hess, MD   5 months ago Essential (primary) hypertension   [facility] Glean Hess, MD   9 months ago Annual physical exam   [facility] Glean Hess, MD   1 year ago Localized edema   [facility] Glean Hess, MD   1 year ago Essential (primary) hypertension   [place], Laura H, MD       Future Appointments             Today Glean Hess, MD [facility], [place]   In 1 week Hollice Espy, MD [place]   In 3 months [place] Melia, Jesse Sans, MD [facility], [facility]

## 2020-05-28 ENCOUNTER — Other Ambulatory Visit
Admission: RE | Admit: 2020-05-28 | Discharge: 2020-05-28 | Disposition: A | Discharge status: Home / Self Care | Payer: 59 | Source: Ambulatory Visit | Attending: Vascular Surgery | Admitting: Vascular Surgery

## 2020-05-28 ENCOUNTER — Other Ambulatory Visit: Payer: Self-pay

## 2020-05-28 DIAGNOSIS — Z20822 Contact with and (suspected) exposure to covid-19: Secondary | ICD-10-CM | POA: Diagnosis present

## 2020-05-28 DIAGNOSIS — Z01812 Encounter for preprocedural laboratory examination: Secondary | ICD-10-CM | POA: Diagnosis present

## 2020-05-28 LAB — SARS CORONAVIRUS 2 (TAT 6-24 HRS): SARS Coronavirus 2: NEGATIVE

## 2020-05-30 ENCOUNTER — Other Ambulatory Visit (INDEPENDENT_AMBULATORY_CARE_PROVIDER_SITE_OTHER): Payer: Self-pay | Admitting: Nurse Practitioner

## 2020-05-30 ENCOUNTER — Encounter
Admission: RE | Disposition: A | Discharge status: Home / Self Care | Payer: Self-pay | Source: Home / Self Care | Attending: Vascular Surgery

## 2020-05-30 ENCOUNTER — Ambulatory Visit
Admission: RE | Admit: 2020-05-30 | Discharge: 2020-05-30 | Disposition: A | Discharge status: Home / Self Care | Payer: 59 | Attending: Vascular Surgery | Admitting: Vascular Surgery

## 2020-05-30 ENCOUNTER — Encounter: Payer: Self-pay | Admitting: Vascular Surgery

## 2020-05-30 ENCOUNTER — Other Ambulatory Visit: Payer: Self-pay

## 2020-05-30 DIAGNOSIS — N186 End stage renal disease: Secondary | ICD-10-CM | POA: Insufficient documentation

## 2020-05-30 DIAGNOSIS — D631 Anemia in chronic kidney disease: Secondary | ICD-10-CM | POA: Diagnosis not present

## 2020-05-30 DIAGNOSIS — E785 Hyperlipidemia, unspecified: Secondary | ICD-10-CM | POA: Diagnosis not present

## 2020-05-30 DIAGNOSIS — E1122 Type 2 diabetes mellitus with diabetic chronic kidney disease: Secondary | ICD-10-CM | POA: Insufficient documentation

## 2020-05-30 DIAGNOSIS — Z7982 Long term (current) use of aspirin: Secondary | ICD-10-CM | POA: Insufficient documentation

## 2020-05-30 DIAGNOSIS — Y841 Kidney dialysis as the cause of abnormal reaction of the patient, or of later complication, without mention of misadventure at the time of the procedure: Secondary | ICD-10-CM | POA: Diagnosis not present

## 2020-05-30 DIAGNOSIS — Z7902 Long term (current) use of antithrombotics/antiplatelets: Secondary | ICD-10-CM | POA: Insufficient documentation

## 2020-05-30 DIAGNOSIS — Z8616 Personal history of COVID-19: Secondary | ICD-10-CM | POA: Diagnosis not present

## 2020-05-30 DIAGNOSIS — T82898A Other specified complication of vascular prosthetic devices, implants and grafts, initial encounter: Secondary | ICD-10-CM

## 2020-05-30 DIAGNOSIS — Z79899 Other long term (current) drug therapy: Secondary | ICD-10-CM | POA: Insufficient documentation

## 2020-05-30 DIAGNOSIS — G4730 Sleep apnea, unspecified: Secondary | ICD-10-CM | POA: Diagnosis not present

## 2020-05-30 DIAGNOSIS — Z992 Dependence on renal dialysis: Secondary | ICD-10-CM

## 2020-05-30 DIAGNOSIS — I120 Hypertensive chronic kidney disease with stage 5 chronic kidney disease or end stage renal disease: Secondary | ICD-10-CM | POA: Insufficient documentation

## 2020-05-30 DIAGNOSIS — Z8546 Personal history of malignant neoplasm of prostate: Secondary | ICD-10-CM | POA: Insufficient documentation

## 2020-05-30 DIAGNOSIS — T82510A Breakdown (mechanical) of surgically created arteriovenous fistula, initial encounter: Secondary | ICD-10-CM | POA: Insufficient documentation

## 2020-05-30 HISTORY — PX: A/V FISTULAGRAM: CATH118298

## 2020-05-30 LAB — POTASSIUM (ARMC VASCULAR LAB ONLY): Potassium (ARMC vascular lab): 4.1 (ref 3.5–5.1)

## 2020-05-30 SURGERY — A/V FISTULAGRAM
Anesthesia: Moderate Sedation | Laterality: Left

## 2020-05-30 MED ORDER — CEFAZOLIN SODIUM-DEXTROSE 1-4 GM/50ML-% IV SOLN
1.0000 g | Freq: Once | INTRAVENOUS | Status: AC
  Administered 2020-05-30: 1 g via INTRAVENOUS

## 2020-05-30 MED ORDER — MIDAZOLAM HCL 2 MG/ML PO SYRP: 8.0000 mg | ORAL_SOLUTION | Freq: Once | ORAL | Status: DC | PRN

## 2020-05-30 MED ORDER — FENTANYL CITRATE (PF) 100 MCG/2ML IJ SOLN: 12.5000 ug | Freq: Once | INTRAMUSCULAR | Status: DC | PRN

## 2020-05-30 MED ORDER — SODIUM CHLORIDE 0.9 % IV SOLN: INTRAVENOUS | Status: DC

## 2020-05-30 MED ORDER — FENTANYL CITRATE (PF) 100 MCG/2ML IJ SOLN
INTRAMUSCULAR | Status: AC
  Filled 2020-05-30: qty 2

## 2020-05-30 MED ORDER — CEFAZOLIN SODIUM-DEXTROSE 1-4 GM/50ML-% IV SOLN
INTRAVENOUS | Status: AC
  Filled 2020-05-30: qty 50

## 2020-05-30 MED ORDER — HEPARIN SODIUM (PORCINE) 1000 UNIT/ML IJ SOLN
INTRAMUSCULAR | Status: DC | PRN
  Administered 2020-05-30: 3000 [IU] via INTRAVENOUS

## 2020-05-30 MED ORDER — METHYLPREDNISOLONE SODIUM SUCC 125 MG IJ SOLR: 125.0000 mg | Freq: Once | INTRAMUSCULAR | Status: DC | PRN

## 2020-05-30 MED ORDER — ONDANSETRON HCL 4 MG/2ML IJ SOLN: 4.0000 mg | Freq: Four times a day (QID) | INTRAMUSCULAR | Status: DC | PRN

## 2020-05-30 MED ORDER — FAMOTIDINE 20 MG PO TABS: 40.0000 mg | ORAL_TABLET | Freq: Once | ORAL | Status: DC | PRN

## 2020-05-30 MED ORDER — MIDAZOLAM HCL 2 MG/2ML IJ SOLN
INTRAMUSCULAR | Status: DC | PRN
  Administered 2020-05-30: 1 mg via INTRAVENOUS
  Administered 2020-05-30: 2 mg via INTRAVENOUS

## 2020-05-30 MED ORDER — FENTANYL CITRATE (PF) 100 MCG/2ML IJ SOLN
INTRAMUSCULAR | Status: DC | PRN
  Administered 2020-05-30: 50 ug via INTRAVENOUS
  Administered 2020-05-30: 25 ug via INTRAVENOUS

## 2020-05-30 MED ORDER — MIDAZOLAM HCL 5 MG/5ML IJ SOLN
INTRAMUSCULAR | Status: AC
  Filled 2020-05-30: qty 5

## 2020-05-30 MED ORDER — HEPARIN SODIUM (PORCINE) 1000 UNIT/ML IJ SOLN
INTRAMUSCULAR | Status: AC
  Filled 2020-05-30: qty 1

## 2020-05-30 MED ORDER — DIPHENHYDRAMINE HCL 50 MG/ML IJ SOLN: 50.0000 mg | Freq: Once | INTRAMUSCULAR | Status: DC | PRN

## 2020-05-30 MED ORDER — IODIXANOL 320 MG/ML IV SOLN
INTRAVENOUS | Status: DC | PRN
  Administered 2020-05-30: 20 mL via INTRAVENOUS

## 2020-05-30 SURGICAL SUPPLY — 17 items
CATH MICROCATH PRGRT 2.8F 110 (CATHETERS) ×1 IMPLANT
COIL 400 COMPLEX SOFT 6X30CM (Vascular Products) ×4 IMPLANT
COIL 400 COMPLEX SOFT 8X35CM (Vascular Products) ×2 IMPLANT
COIL 400 COMPLEX SOFT 8X60CM (Vascular Products) ×2 IMPLANT
COVER PROBE U/S 5X48 (MISCELLANEOUS) ×2 IMPLANT
DEVICE OCCLUSION PODJ15 (Vascular Products) ×1 IMPLANT
DEVICE OCCLUSION PODJ30 (Vascular Products) ×1 IMPLANT
DRAPE BRACHIAL (DRAPES) ×2 IMPLANT
HANDLE DETACHMENT COIL (MISCELLANEOUS) ×2 IMPLANT
MICROCATH PROGREAT 2.8F 110 CM (CATHETERS) ×2
NEEDLE ENTRY 21GA 7CM ECHOTIP (NEEDLE) ×2 IMPLANT
OCCLUSION DEVICE PODJ15 (Vascular Products) ×2 IMPLANT
OCCLUSION DEVICE PODJ30 (Vascular Products) ×2 IMPLANT
PACK ANGIOGRAPHY (CUSTOM PROCEDURE TRAY) ×2 IMPLANT
SET INTRO CAPELLA COAXIAL (SET/KITS/TRAYS/PACK) ×2 IMPLANT
SHEATH BRITE TIP 6FRX5.5 (SHEATH) ×2 IMPLANT
SUT MNCRL AB 4-0 PS2 18 (SUTURE) ×2 IMPLANT

## 2020-05-30 NOTE — Progress Notes (Signed)
Patient with right chest wall perma cath, received HD  M-W-F thru cath.  Also with LUE fistula +_bruit/thrill .

## 2020-05-30 NOTE — Interval H&P Note (Signed)
History and Physical Interval Note:  05/30/2020 8:16 AM  Benjamin Stewart  has presented today for surgery, with the diagnosis of LT Upper Extremity Fistulagram   ESRD   Pt to have Covid test on 05-28-20.  The various methods of treatment have been discussed with the patient and family. After consideration of risks, benefits and other options for treatment, the patient has consented to  Procedure(s): A/V FISTULAGRAM (Left) as a surgical intervention.  The patient's history has been reviewed, patient examined, no change in status, stable for surgery.  I have reviewed the patient's chart and labs.  Questions were answered to the patient's satisfaction.     [name]

## 2020-05-30 NOTE — Op Note (Signed)
[facility]    OPERATIVE NOTE   PROCEDURE: 1.   Left brachiocephalic arteriovenous fistula cannulation under ultrasound guidance 2.   Left arm fistulagram  3.   Coil embolization of the large competing branch off of the cephalic vein with a total of 6 Ruby coils.  PRE-OPERATIVE DIAGNOSIS: 1. ESRD 2. Poorly functional, difficult to access left brachiocephalic AVF  POST-OPERATIVE DIAGNOSIS: same as above   SURGEON: [name], MD  ANESTHESIA: local with MCS  ESTIMATED BLOOD LOSS: 3 cc  FINDING(S): 1. Large competing branch in the mid to distal upper arm. Remainder of the AVF appeared fine.  SPECIMEN(S):  None  CONTRAST: 20 cc  FLUORO TIME: 2.6 minutes  MODERATE CONSCIOUS SEDATION TIME: Approximately 33 minutes with 3 mg of Versed and 75 mcg of Fentanyl   INDICATIONS: Benjamin Stewart is a 44 y.o. male who presents with malfunctioning left brachiocephalic arteriovenous fistula. It is difficult to access. The patient is scheduled for left arm fistulagram.  The patient is aware the risks include but are not limited to: bleeding, infection, thrombosis of the cannulated access, and possible anaphylactic reaction to the contrast.  The patient is aware of the risks of the procedure and elects to proceed forward.  DESCRIPTION: After full informed written consent was obtained, the patient was brought back to the angiography suite and placed supine upon the angiography table.  The patient was connected to monitoring equipment. Moderate conscious sedation was administered with a face to face encounter with the patient throughout the procedure with my supervision of the RN administering medicines and monitoring the patient's vital signs and mental status throughout from the start of the procedure until the patient was taken to the recovery room. The left arm was prepped and draped in the standard fashion for a percutaneous access intervention.  Under ultrasound guidance,  the left brachiocephalic arteriovenous fistula was cannulated with a micropuncture needle under direct ultrasound guidance in an antegrade fashion near the anastomosis where it was patent and a permanent image was performed.  The microwire was advanced into the fistula and the needle was exchanged for the a microsheath.  I then upsized to a 6 Fr Sheath and imaging was performed.  Hand injections were completed to image the access. This demonstrated a large competing branch about 6 to 8 cm beyond the anastomosis in the mid to distal upper arm cephalic vein.  There are some other smaller branches more proximally in the arm but these were not large.  The fistula otherwise was patent and had good flow.  Based on the images, this patient will need embolization of the large competing branch. I then gave the patient 3000 units of intravenous heparin. I then cannulated the large competing branch with a prograde microcatheter and advanced out to one of the main secondary branches of this branch.  I started coil embolization with a 6 mm diameter by 30 cm soft coil and used 2 of these.  I then came back into the primary part of this large competing branch and put an 8 mm diameter by 35 cm soft and an 8 mm diameter by 60 cm soft.  2 packing coils were then placed to cover proximal to the fairly large side branches off of the competing branch.  This came down to very near the origin of this competing side branch.  At this point, imaging through the sheath in the AV fistula itself showed good flow through the fistula which markedly diminished flow  through this competing branch.   Based on the completion imaging, no further intervention is necessary.  The wire and balloon were removed from the sheath.  A 4-0 Monocryl purse-string suture was sewn around the sheath.  The sheath was removed while tying down the suture.  A sterile bandage was applied to the puncture site.  COMPLICATIONS: None  CONDITION: Stable   [name]  05/30/2020 9:32 AM   This note was created with Dragon Medical transcription system. Any errors in dictation are purely unintentional.

## 2020-05-30 NOTE — Progress Notes (Signed)
Patient discharge to home. LUE fistula with bandaid, +bruit/thrill  +2 radial pulse.  Right chest wall permacath in place.  No c/o's   For HD in am.

## 2020-06-03 NOTE — Progress Notes (Signed)
Today's appointment was made in error, I will hold visit previously scheduled but not canceled.  Plan follow-up in 3 months with PSA as previously documented.  Hollice Espy, MD

## 2020-06-04 ENCOUNTER — Ambulatory Visit (INDEPENDENT_AMBULATORY_CARE_PROVIDER_SITE_OTHER): Payer: 59 | Admitting: Urology

## 2020-06-04 ENCOUNTER — Other Ambulatory Visit: Payer: Self-pay

## 2020-06-04 VITALS — BP 113/71 | HR 94

## 2020-06-04 DIAGNOSIS — R9721 Rising PSA following treatment for malignant neoplasm of prostate: Secondary | ICD-10-CM

## 2020-06-25 ENCOUNTER — Other Ambulatory Visit (INDEPENDENT_AMBULATORY_CARE_PROVIDER_SITE_OTHER): Payer: Self-pay | Admitting: Vascular Surgery

## 2020-06-25 DIAGNOSIS — T829XXS Unspecified complication of cardiac and vascular prosthetic device, implant and graft, sequela: Secondary | ICD-10-CM

## 2020-06-25 DIAGNOSIS — N186 End stage renal disease: Secondary | ICD-10-CM

## 2020-06-27 ENCOUNTER — Ambulatory Visit (INDEPENDENT_AMBULATORY_CARE_PROVIDER_SITE_OTHER): Payer: 59 | Admitting: Nurse Practitioner

## 2020-06-27 ENCOUNTER — Encounter (INDEPENDENT_AMBULATORY_CARE_PROVIDER_SITE_OTHER): Payer: Self-pay | Admitting: Nurse Practitioner

## 2020-06-27 ENCOUNTER — Other Ambulatory Visit: Payer: Self-pay

## 2020-06-27 ENCOUNTER — Ambulatory Visit (INDEPENDENT_AMBULATORY_CARE_PROVIDER_SITE_OTHER): Payer: 59

## 2020-06-27 VITALS — BP 126/83 | HR 86 | Ht 72.0 in | Wt 258.0 lb

## 2020-06-27 DIAGNOSIS — T829XXS Unspecified complication of cardiac and vascular prosthetic device, implant and graft, sequela: Secondary | ICD-10-CM | POA: Diagnosis not present

## 2020-06-27 DIAGNOSIS — I10 Essential (primary) hypertension: Secondary | ICD-10-CM | POA: Diagnosis not present

## 2020-06-27 DIAGNOSIS — N186 End stage renal disease: Secondary | ICD-10-CM | POA: Diagnosis not present

## 2020-06-27 DIAGNOSIS — E782 Mixed hyperlipidemia: Secondary | ICD-10-CM | POA: Diagnosis not present

## 2020-07-01 ENCOUNTER — Encounter (INDEPENDENT_AMBULATORY_CARE_PROVIDER_SITE_OTHER): Payer: Self-pay | Admitting: Nurse Practitioner

## 2020-07-01 NOTE — Progress Notes (Signed)
Subjective:    Patient ID: Benjamin Stewart, male    DOB: 1976/06/01, 44 y.o.   MRN: [id number] Chief Complaint  Patient presents with  . Follow-up    4 wk [facility] post AVV fistulagram HDA     The patient returns to the office for followup status post intervention of the dialysis access left brachiocephalic AV fistula. Following the intervention the access function has significantly improved, with better flow rates and improved KT/V. The patient has not been experiencing increased bleeding times following decannulation and the patient denies increased recirculation. The patient denies an increase in arm swelling. At the present time the patient denies hand pain.  The patient denies amaurosis fugax or recent TIA symptoms. There are no recent neurological changes noted. The patient denies claudication symptoms or rest pain symptoms. The patient denies history of DVT, PE or superficial thrombophlebitis. The patient denies recent episodes of angina or shortness of breath.    Today the patient has a flow volume of 2476.  No obvious areas of stenosis.  AV fistula is patent throughout     Review of Systems  Hematological: Bruises/bleeds easily.  All other systems reviewed and are negative.      Objective:   Physical Exam Vitals reviewed.  HENT:     Head: Normocephalic.  Cardiovascular:     Rate and Rhythm: Normal rate.     Pulses: Normal pulses.     Arteriovenous access: left arteriovenous access is present.    Comments: Left brachiocephalic AV fistula with good thrill and bruit Pulmonary:     Effort: Pulmonary effort is normal.  Neurological:     Mental Status: He is alert and oriented to person, place, and time.  Psychiatric:        Mood and Affect: Mood normal.        Behavior: Behavior normal.        Thought Content: Thought content normal.        Judgment: Judgment normal.     BP 126/83   Pulse 86   Ht 6' (1.829 m)   Wt 258 lb (117 kg)   BMI 34.99 kg/m   Past  Medical History:  Diagnosis Date  . Anemia   . Benign essential hematuria 12/04/2014   microscopic hematuria evaluation with CT and cysto - treated for 2 months with bactrim DS; no further f/u planned unless hematuria recurs.   . Chronic kidney disease   . Controlled type 2 diabetes mellitus without complication, without long-term current use of insulin ([place]) 07/16/2015  . COVID-19 virus infection 12/29/2019  . Essential (primary) hypertension 12/04/2014  . History of kidney stones    H/O  . prostate cancer    PROSTATE   . Sleep apnea    DOES NOT USE CPAP    Social History   Socioeconomic History  . Marital status: Divorced    Spouse name: Not on file  . Number of children: Not on file  . Years of education: Not on file  . Highest education level: Not on file  Occupational History  . Not on file  Tobacco Use  . Smoking status: Never Smoker  . Smokeless tobacco: Never Used  Vaping Use  . Vaping Use: Never used  Substance and Sexual Activity  . Alcohol use: No    Alcohol/week: 0.0 standard drinks  . Drug use: No  . Sexual activity: Not on file  Other Topics Concern  . Not on file  Social History Narrative  . Not  on file   Social Determinants of Health   Financial Resource Strain:   . Difficulty of Paying Living Expenses: Not on file  Food Insecurity:   . Worried About Charity fundraiser in the Last Year: Not on file  . Ran Out of Food in the Last Year: Not on file  Transportation Needs:   . Lack of Transportation (Medical): Not on file  . Lack of Transportation (Non-Medical): Not on file  Physical Activity:   . Days of Exercise per Week: Not on file  . Minutes of Exercise per Session: Not on file  Stress:   . Feeling of Stress : Not on file  Social Connections:   . Frequency of Communication with Friends and Family: Not on file  . Frequency of Social Gatherings with Friends and Family: Not on file  . Attends Religious Services: Not on file  . Active Member of  Clubs or Organizations: Not on file  . Attends Archivist Meetings: Not on file  . Marital Status: Not on file  Intimate Partner Violence:   . Fear of Current or Ex-Partner: Not on file  . Emotionally Abused: Not on file  . Physically Abused: Not on file  . Sexually Abused: Not on file    Past Surgical History:  Procedure Laterality Date  . A/V FISTULAGRAM Left 05/30/2020   Procedure: A/V FISTULAGRAM;  Surgeon: Algernon Huxley, MD;  Location: [facility];  Service: Cardiovascular;  Laterality: Left;  . AV FISTULA PLACEMENT Left 03/07/2020   Procedure: ARTERIOVENOUS (AV) FISTULA CREATION BRACHIAL CEPHALIC;  Surgeon: Algernon Huxley, MD;  Location: [facility];  Service: Vascular;  Laterality: Left;  . DIALYSIS/PERMA CATHETER INSERTION N/A 01/17/2020   Procedure: DIALYSIS/PERMA CATHETER INSERTION;  Surgeon: Algernon Huxley, MD;  Location: [facility];  Service: Cardiovascular;  Laterality: N/A;  . PARTIAL COLECTOMY  2005  . RADIOACTIVE SEED IMPLANT N/A 12/06/2017   Procedure: RADIOACTIVE SEED IMPLANT/BRACHYTHERAPY IMPLANT;  Surgeon: Hollice Espy, MD;  Location: [facility];  Service: Urology;  Laterality: N/A;  . UPPER EXTREMITY ANGIOGRAPHY Left 04/18/2020   Procedure: UPPER EXTREMITY ANGIOGRAPHY;  Surgeon: Algernon Huxley, MD;  Location: [facility];  Service: Cardiovascular;  Laterality: Left;    Family History  Problem Relation Age of Onset  . Diabetes Mother   . Heart disease Mother   . Hypertension Mother   . Glaucoma Mother   . Prostate cancer Father   . Diabetes Maternal Grandmother   . Diabetes Maternal Grandfather   . Prostate cancer Maternal Uncle     Allergies  Allergen Reactions  . Glimepiride Other (See Comments)    Severe hypoglycemia  . Other   . Percocet [Oxycodone-Acetaminophen] Nausea And Vomiting    CBC Latest Ref Rng & Units 03/07/2020 03/06/2020 12/31/2019  WBC 4.0 - 10.5 K/uL - 7.9 8.0  Hemoglobin 13.0 - 17.0 g/dL 8.2(L) 8.2(L) 8.6(L)   Hematocrit 39 - 52 % 24.0(L) 26.5(L) 26.0(L)  Platelets 150 - 400 K/uL - 331 326      CMP     Component Value Date/Time   NA 139 03/07/2020 1056   NA 142 11/27/2019 1526   NA 143 02/10/2012 1850   K 3.4 (L) 03/07/2020 1056   K 3.8 02/10/2012 1850   CL 97 (L) 03/07/2020 1056   CL 105 02/10/2012 1850   CO2 25 03/06/2020 1001   CO2 30 02/10/2012 1850   GLUCOSE 120 (H) 03/07/2020 1056   GLUCOSE 127 (H)  02/10/2012 1850   BUN 21 (H) 03/07/2020 1056   BUN 56 (H) 11/27/2019 1526   BUN 11 02/10/2012 1850   CREATININE 6.90 (H) 03/07/2020 1056   CREATININE 1.17 02/10/2012 1850   CALCIUM 9.3 03/06/2020 1001   CALCIUM 8.8 02/10/2012 1850   PROT 7.1 11/09/2019 0036   PROT 7.2 08/21/2019 0925   ALBUMIN 2.6 (L) 01/01/2020 0605   ALBUMIN 3.3 (L) 08/21/2019 0925   AST 32 11/09/2019 0036   ALT 17 11/09/2019 0036   ALKPHOS 37 (L) 11/09/2019 0036   BILITOT 0.6 11/09/2019 0036   BILITOT 0.6 08/21/2019 0925   GFRNONAA 7 (L) 03/06/2020 1001   GFRNONAA >60 02/10/2012 1850   GFRAA 9 (L) 03/06/2020 1001   GFRAA >60 02/10/2012 1850     No results found.     Assessment & Plan:   1. End stage renal disease ([place]) Recommend:  The patient is doing well and currently has adequate dialysis access. Flow pattern is stable when compared to the prior ultrasound. Today we marked the patient's AV fistula and we will allow for access at his dialysis center.  The patient does have a somewhat deep AV fistula but the thrill and bruit is very strong.  We will see if the dialysis center can access his fistula as is.  If there are any issues we may need to discuss a possible superficialization.   The patient should have a duplex ultrasound of the dialysis access in 6 months. The patient will follow-up with me in the office after each ultrasound     2. Hyperlipidemia, mixed Continue statin as ordered and reviewed, no changes at this time   3. Essential (primary) hypertension Continue  antihypertensive medications as already ordered, these medications have been reviewed and there are no changes at this time.    Current Outpatient Medications on File Prior to Visit  Medication Sig Dispense Refill  . amLODipine (NORVASC) 10 MG tablet Take 1 tablet (10 mg total) by mouth daily. 90 tablet 1  . aspirin EC 81 MG tablet Take 1 tablet (81 mg total) by mouth daily. 150 tablet 2  . atorvastatin (LIPITOR) 10 MG tablet TAKE 1 TABLET BY MOUTH EVERY DAY WITH BREAKFAST 90 tablet 3  . calcitRIOL (ROCALTROL) 0.25 MCG capsule TAKE 1 CAPSULE(0.25 MCG) BY MOUTH DAILY 90 capsule 1  . carvedilol (COREG) 6.25 MG tablet Take 6.25 mg by mouth 2 (two) times daily with a meal.     . cloNIDine (CATAPRES - DOSED IN MG/24 HR) 0.2 mg/24hr patch Place 1 patch (0.2 mg total) onto the skin once a week. (Patient taking differently: Place 0.2 mg onto the skin once a week. LAST CHANGED ON 03-05-20) 4 patch 6  . clopidogrel (PLAVIX) 75 MG tablet Take 1 tablet (75 mg total) by mouth daily. 30 tablet 11  . hydrALAZINE (APRESOLINE) 100 MG tablet Take 100 mg by mouth 3 (three) times daily.    . sevelamer carbonate (RENVELA) 800 MG tablet Take 1 tablet (800 mg total) by mouth 2 (two) times daily with a meal. 60 tablet 1  . sodium bicarbonate 650 MG tablet Take 1 tablet (650 mg total) by mouth 3 (three) times daily. 90 tablet 1  . traMADol (ULTRAM) 50 MG tablet Take 1 tablet (50 mg total) by mouth every 6 (six) hours as needed. 25 tablet 0   No current facility-administered medications on file prior to visit.    There are no Patient Instructions on file for this visit. No follow-ups  on file.   Kris Hartmann, NP

## 2020-07-27 ENCOUNTER — Other Ambulatory Visit: Payer: Self-pay | Admitting: Internal Medicine

## 2020-07-27 DIAGNOSIS — I10 Essential (primary) hypertension: Secondary | ICD-10-CM

## 2020-08-08 ENCOUNTER — Telehealth (INDEPENDENT_AMBULATORY_CARE_PROVIDER_SITE_OTHER): Payer: Self-pay

## 2020-08-08 NOTE — Telephone Encounter (Signed)
Spoke with the patient and he is now scheduled with Dr. Lucky Cowboy for a permcath removal on 08/15/20 with a 11:15 am arrival time to the [facility]. Covid testing on 08/13/20 between 8-1 pm at the [facility]. Pre-procedure instructions were discussed and will be mailed.

## 2020-08-13 ENCOUNTER — Other Ambulatory Visit
Admission: RE | Admit: 2020-08-13 | Discharge: 2020-08-13 | Disposition: A | Discharge status: Home / Self Care | Payer: Medicare Other | Source: Ambulatory Visit | Attending: Vascular Surgery | Admitting: Vascular Surgery

## 2020-08-13 ENCOUNTER — Other Ambulatory Visit: Payer: Self-pay

## 2020-08-13 DIAGNOSIS — Z01812 Encounter for preprocedural laboratory examination: Secondary | ICD-10-CM | POA: Diagnosis present

## 2020-08-13 DIAGNOSIS — U071 COVID-19: Secondary | ICD-10-CM | POA: Diagnosis not present

## 2020-08-14 ENCOUNTER — Telehealth (INDEPENDENT_AMBULATORY_CARE_PROVIDER_SITE_OTHER): Payer: Self-pay

## 2020-08-14 LAB — SARS CORONAVIRUS 2 (TAT 6-24 HRS): SARS Coronavirus 2: POSITIVE — AB

## 2020-08-14 NOTE — Telephone Encounter (Signed)
Patient called stated he was positive for covid, patient is scheduled with Dr. Lucky Cowboy for a permcath removal on 08/15/20. This procedure has been canceled for now.

## 2020-08-15 ENCOUNTER — Encounter: Admission: RE | Payer: Self-pay | Source: Home / Self Care

## 2020-08-15 ENCOUNTER — Ambulatory Visit: Admission: RE | Admit: 2020-08-15 | Payer: Medicare Other | Source: Home / Self Care | Admitting: Vascular Surgery

## 2020-08-15 DIAGNOSIS — N186 End stage renal disease: Secondary | ICD-10-CM

## 2020-08-15 SURGERY — DIALYSIS/PERMA CATHETER REMOVAL
Anesthesia: LOCAL

## 2020-08-21 ENCOUNTER — Encounter: Payer: 59 | Admitting: Internal Medicine

## 2020-09-03 ENCOUNTER — Other Ambulatory Visit: Payer: 59

## 2020-09-03 ENCOUNTER — Other Ambulatory Visit: Payer: Self-pay

## 2020-09-03 DIAGNOSIS — R9721 Rising PSA following treatment for malignant neoplasm of prostate: Secondary | ICD-10-CM

## 2020-09-04 ENCOUNTER — Ambulatory Visit (INDEPENDENT_AMBULATORY_CARE_PROVIDER_SITE_OTHER): Payer: 59 | Admitting: Urology

## 2020-09-04 VITALS — BP 142/88 | HR 109 | Ht 72.0 in | Wt 250.0 lb

## 2020-09-04 DIAGNOSIS — R9721 Rising PSA following treatment for malignant neoplasm of prostate: Secondary | ICD-10-CM

## 2020-09-04 DIAGNOSIS — Z8546 Personal history of malignant neoplasm of prostate: Secondary | ICD-10-CM

## 2020-09-04 DIAGNOSIS — N186 End stage renal disease: Secondary | ICD-10-CM | POA: Diagnosis not present

## 2020-09-04 LAB — PSA: Prostate Specific Ag, Serum: 5.7 ng/mL — ABNORMAL HIGH (ref 0.0–4.0)

## 2020-09-04 NOTE — Progress Notes (Unsigned)
09/04/2020 2:52 PM   Benjamin Stewart [birth year] [id number]  Referring provider: Glean Hess, MD [address] [place],  [place] [address]  Chief Complaint  Patient presents with  . Elevated PSA    HPI: 45 year old male with personal history of prostate cancer who returns today for follow-up.  Since last visit, he was diagnosed with COVID-19 but is recovering nicely.  He has a personal history of Gleason 3+4 intermediate risk prostate cancer, initially diagnosed in 2017 on active surveillance with a PSA which subsequently rose to 8.4 at the time of treatment. He elected to pursue I-125 brachytherapy prostate seed implantcompleted on 11/2017.  In the interim, has had a slow rise in his PSA, now up to   Renal US from 12/30/2019 noted no acute abnormality. Echogenic kidneys bilaterally which can be seen in patients with medical renal disease. No hydronephrosis.  Patient is on dialysis 3 times/week.  He continues to make urine but less than previously.  He has no voiding symptoms including no dysuria, urgency frequency.  He feels like he empties well.  He is on fluid restrictions which he thinks attributes to his lower output.  Component     Latest Ref Rng & Units 10/16/2015 05/29/2016 12/10/2016 06/21/2017  Prostate Specific Ag, Serum     0.0 - 4.0 ng/mL 6.5 (H) 5.6 (H) 5.8 (H) 8.4 (H)   Component     Latest Ref Rng & Units 01/20/2018 07/22/2018 04/27/2019 05/07/2020  Prostate Specific Ag, Serum     0.0 - 4.0 ng/mL 4.4 (H) 2.0 2.3 3.8   Component     Latest Ref Rng & Units 09/03/2020  Prostate Specific Ag, Serum     0.0 - 4.0 ng/mL 5.7 (H)     PMH: Past Medical History:  Diagnosis Date  . Anemia   . Benign essential hematuria 12/04/2014   microscopic hematuria evaluation with CT and cysto - treated for 2 months with bactrim DS; no further f/u planned unless hematuria recurs.   . Chronic kidney disease   . Controlled type 2 diabetes mellitus without  complication, without long-term current use of insulin ([place]) 07/16/2015  . COVID-19 virus infection 12/29/2019  . Essential (primary) hypertension 12/04/2014  . History of kidney stones    H/O  . prostate cancer    PROSTATE   . Sleep apnea    DOES NOT USE CPAP    Surgical History: Past Surgical History:  Procedure Laterality Date  . A/V FISTULAGRAM Left 05/30/2020   Procedure: A/V FISTULAGRAM;  Surgeon: Algernon Huxley, MD;  Location: [facility];  Service: Cardiovascular;  Laterality: Left;  . AV FISTULA PLACEMENT Left 03/07/2020   Procedure: ARTERIOVENOUS (AV) FISTULA CREATION BRACHIAL CEPHALIC;  Surgeon: Algernon Huxley, MD;  Location: [facility];  Service: Vascular;  Laterality: Left;  . DIALYSIS/PERMA CATHETER INSERTION N/A 01/17/2020   Procedure: DIALYSIS/PERMA CATHETER INSERTION;  Surgeon: Algernon Huxley, MD;  Location: [facility];  Service: Cardiovascular;  Laterality: N/A;  . PARTIAL COLECTOMY  2005  . RADIOACTIVE SEED IMPLANT N/A 12/06/2017   Procedure: RADIOACTIVE SEED IMPLANT/BRACHYTHERAPY IMPLANT;  Surgeon: Hollice Espy, MD;  Location: [facility];  Service: Urology;  Laterality: N/A;  . UPPER EXTREMITY ANGIOGRAPHY Left 04/18/2020   Procedure: UPPER EXTREMITY ANGIOGRAPHY;  Surgeon: Algernon Huxley, MD;  Location: [facility];  Service: Cardiovascular;  Laterality: Left;    Home Medications:  Allergies as of 09/04/2020      Reactions   Glimepiride  Other (See Comments)   Severe hypoglycemia   Other    Percocet [oxycodone-acetaminophen] Nausea And Vomiting      Medication List       Accurate as of September 04, 2020  2:52 PM. If you have any questions, ask your nurse or doctor.        amLODipine 10 MG tablet Commonly known as: NORVASC TAKE 1 TABLET BY MOUTH EVERY DAY   aspirin EC 81 MG tablet Take 1 tablet (81 mg total) by mouth daily.   atorvastatin 10 MG tablet Commonly known as: LIPITOR TAKE 1 TABLET BY MOUTH EVERY DAY WITH BREAKFAST   calcitRIOL  0.25 MCG capsule Commonly known as: ROCALTROL TAKE 1 CAPSULE(0.25 MCG) BY MOUTH DAILY   carvedilol 6.25 MG tablet Commonly known as: COREG Take 6.25 mg by mouth 2 (two) times daily with a meal.   cloNIDine 0.2 mg/24hr patch Commonly known as: CATAPRES - Dosed in mg/24 hr Place 1 patch (0.2 mg total) onto the skin once a week. What changed: additional instructions   clopidogrel 75 MG tablet Commonly known as: Plavix Take 1 tablet (75 mg total) by mouth daily.   hydrALAZINE 100 MG tablet Commonly known as: APRESOLINE Take 100 mg by mouth 3 (three) times daily.   sevelamer carbonate 800 MG tablet Commonly known as: RENVELA Take 1 tablet (800 mg total) by mouth 2 (two) times daily with a meal.   sodium bicarbonate 650 MG tablet Take 1 tablet (650 mg total) by mouth 3 (three) times daily.   traMADol 50 MG tablet Commonly known as: ULTRAM Take 1 tablet (50 mg total) by mouth every 6 (six) hours as needed.       Allergies:  Allergies  Allergen Reactions  . Glimepiride Other (See Comments)    Severe hypoglycemia  . Other   . Percocet [Oxycodone-Acetaminophen] Nausea And Vomiting    Family History: Family History  Problem Relation Age of Onset  . Diabetes Mother   . Heart disease Mother   . Hypertension Mother   . Glaucoma Mother   . Prostate cancer Father   . Diabetes Maternal Grandmother   . Diabetes Maternal Grandfather   . Prostate cancer Maternal Uncle     Social History:  reports that he has never smoked. He has never used smokeless tobacco. He reports that he does not drink alcohol and does not use drugs.   Physical Exam: BP (!) 142/88   Pulse (!) 109   Ht 6' (1.829 m)   Wt 250 lb (113.4 kg)   BMI 33.91 kg/m   Constitutional:  Alert and oriented, No acute distress. HEENT: [place] AT, moist mucus membranes.  Trachea midline, no masses. Cardiovascular: No clubbing, cyanosis, or edema. Respiratory: Normal respiratory effort, no increased work of  breathing. Skin: No rashes, bruises or suspicious lesions. Neurologic: Grossly intact, no focal deficits, moving all 4 extremities. Psychiatric: Normal mood and affect.  Assessment & Plan:    1. Increasing PSA level after treatment for prostate cancer History of intermediate risk prostate cancer status post IMRT with nadir around 2, now reaches the criteria for biochemical recurrence  At this point, recommended restaging imaging.  We will pursue more specifically PSMA PET scan as below.  He is agreeable this plan.  We will have him return with follow-up scan.  He is otherwise asymptomatic. - NM PET ([id number]) SKULL TO MID THIGH; Future  2. End stage renal disease ([place]) On hemodialysis, continues to make urine   Hollice Espy, MD  [facility] [address], [place], [place] [address] [phone]  I spent 30 total minutes on the day of the encounter including pre-visit review of the medical record, face-to-face time with the patient, and post visit ordering of labs/imaging/tests.

## 2020-09-05 ENCOUNTER — Ambulatory Visit: Payer: Self-pay | Admitting: Urology

## 2020-09-05 LAB — HEMOGLOBIN A1C: Hemoglobin A1C: 5.9

## 2020-09-09 ENCOUNTER — Telehealth: Payer: Self-pay | Admitting: *Deleted

## 2020-09-09 NOTE — Telephone Encounter (Signed)
Notified patient as instructed, patient pleased. Patient states PET scan scheduled for 09/24/2020.

## 2020-09-09 NOTE — Telephone Encounter (Addendum)
Left VM to return call  ----- Message from Hollice Espy, MD sent at 09/06/2020  9:48 AM EST ----- Regarding: FW: NM study Please let this patient know that I looked at his labs.  It still quite elevated.  1 test was done at [facility] and another one was done at [facility] so there is some variation in the test.  I do think it is wise to go ahead and move forward with PET scan.  Hollice Espy, MD   ----- Message ----- From: Benard Halsted Sent: 09/05/2020   3:33 PM EST To: Hollice Espy, MD Subject: NM study                                       I called today and got him scheduled for his NM study. While I had him on the phone he wanted me to let you know that he went to [facility] today and they drew his PSA and it went down. You should be able to see that in care everywhere.   Thanks, Sharyn Lull

## 2020-09-16 ENCOUNTER — Encounter (INDEPENDENT_AMBULATORY_CARE_PROVIDER_SITE_OTHER): Payer: Self-pay

## 2020-09-24 ENCOUNTER — Ambulatory Visit
Admission: RE | Admit: 2020-09-24 | Discharge: 2020-09-24 | Disposition: A | Discharge status: Home / Self Care | Payer: 59 | Source: Ambulatory Visit | Attending: Urology | Admitting: Urology

## 2020-09-24 ENCOUNTER — Other Ambulatory Visit: Payer: Self-pay

## 2020-09-24 DIAGNOSIS — R9721 Rising PSA following treatment for malignant neoplasm of prostate: Secondary | ICD-10-CM | POA: Insufficient documentation

## 2020-09-24 DIAGNOSIS — C61 Malignant neoplasm of prostate: Secondary | ICD-10-CM | POA: Insufficient documentation

## 2020-09-24 MED ORDER — PIFLIFOLASTAT F 18 (PYLARIFY) INJECTION
9.0000 | Freq: Once | INTRAVENOUS | Status: AC
  Administered 2020-09-24: 9.371 via INTRAVENOUS

## 2020-09-25 ENCOUNTER — Encounter: Payer: Self-pay | Admitting: Urology

## 2020-09-26 ENCOUNTER — Telehealth (INDEPENDENT_AMBULATORY_CARE_PROVIDER_SITE_OTHER): Payer: Self-pay

## 2020-09-26 NOTE — Telephone Encounter (Signed)
I received a fax from [facility] regarding the patient getting a permcath removal. Patient is scheduled with Dr. Lucky Cowboy on 10/07/20 with a 10:30 am arrival time to the [facility]. Covid testing on 10/03/20 between 8-1 pm at the [facility]. Pre-procedure instructions will be faxed to [facility].

## 2020-10-03 LAB — HM COLONOSCOPY

## 2020-10-07 ENCOUNTER — Ambulatory Visit
Admission: RE | Admit: 2020-10-07 | Discharge: 2020-10-07 | Disposition: A | Discharge status: Home / Self Care | Payer: 59 | Attending: Vascular Surgery | Admitting: Vascular Surgery

## 2020-10-07 ENCOUNTER — Encounter
Admission: RE | Disposition: A | Discharge status: Home / Self Care | Payer: Self-pay | Source: Home / Self Care | Attending: Vascular Surgery

## 2020-10-07 ENCOUNTER — Other Ambulatory Visit (INDEPENDENT_AMBULATORY_CARE_PROVIDER_SITE_OTHER): Payer: Self-pay | Admitting: Nurse Practitioner

## 2020-10-07 ENCOUNTER — Encounter: Payer: Self-pay | Admitting: Vascular Surgery

## 2020-10-07 DIAGNOSIS — N186 End stage renal disease: Secondary | ICD-10-CM

## 2020-10-07 DIAGNOSIS — I120 Hypertensive chronic kidney disease with stage 5 chronic kidney disease or end stage renal disease: Secondary | ICD-10-CM | POA: Insufficient documentation

## 2020-10-07 DIAGNOSIS — Z4901 Encounter for fitting and adjustment of extracorporeal dialysis catheter: Secondary | ICD-10-CM | POA: Diagnosis present

## 2020-10-07 DIAGNOSIS — Z992 Dependence on renal dialysis: Secondary | ICD-10-CM

## 2020-10-07 DIAGNOSIS — E1122 Type 2 diabetes mellitus with diabetic chronic kidney disease: Secondary | ICD-10-CM | POA: Insufficient documentation

## 2020-10-07 DIAGNOSIS — Z885 Allergy status to narcotic agent status: Secondary | ICD-10-CM | POA: Insufficient documentation

## 2020-10-07 HISTORY — PX: DIALYSIS/PERMA CATHETER REMOVAL: CATH118289

## 2020-10-07 SURGERY — DIALYSIS/PERMA CATHETER REMOVAL
Anesthesia: LOCAL

## 2020-10-07 SURGICAL SUPPLY — 3 items
BLADE SURG SZ11 CARB STEEL (BLADE) ×2 IMPLANT
CHLORAPREP W/TINT 26 (MISCELLANEOUS) ×4 IMPLANT
TRAY LACERAT/PLASTIC (MISCELLANEOUS) ×2 IMPLANT

## 2020-10-07 NOTE — H&P (Signed)
[facility] Admission History & Physical  MRN : [id number]  Benjamin Stewart is a 45 y.o. (1976-04-02) male who presents with chief complaint of scheduled PermCath removal.  History of Present Illness:  I am asked to evaluate the patient by the dialysis center. The patient was sent here because they have a tunneled catheter and a functioning upper extremity dialysis access. The patient reports they're not been any problems with any of their dialysis runs. They are reporting good flows with good parameters at dialysis. Patient denies pain or tenderness overlying the access.  There is no pain with dialysis.  The patient denies hand pain or finger pain consistent with steal syndrome.  No fevers or chills while on dialysis.  Patient presents for a scheduled PermCath removal.  No current facility-administered medications for this encounter.   Past Medical History:  Diagnosis Date  . Anemia   . Benign essential hematuria 12/04/2014   microscopic hematuria evaluation with CT and cysto - treated for 2 months with bactrim DS; no further f/u planned unless hematuria recurs.   . Chronic kidney disease   . Controlled type 2 diabetes mellitus without complication, without long-term current use of insulin ([place]) 07/16/2015  . COVID-19 virus infection 12/29/2019  . Essential (primary) hypertension 12/04/2014  . History of kidney stones    H/O  . prostate cancer    PROSTATE   . Sleep apnea    DOES NOT USE CPAP   Past Surgical History:  Procedure Laterality Date  . A/V FISTULAGRAM Left 05/30/2020   Procedure: A/V FISTULAGRAM;  Surgeon: Algernon Huxley, MD;  Location: [facility];  Service: Cardiovascular;  Laterality: Left;  . AV FISTULA PLACEMENT Left 03/07/2020   Procedure: ARTERIOVENOUS (AV) FISTULA CREATION BRACHIAL CEPHALIC;  Surgeon: Algernon Huxley, MD;  Location: [facility];  Service: Vascular;  Laterality: Left;  . DIALYSIS/PERMA CATHETER INSERTION N/A 01/17/2020    Procedure: DIALYSIS/PERMA CATHETER INSERTION;  Surgeon: Algernon Huxley, MD;  Location: [facility];  Service: Cardiovascular;  Laterality: N/A;  . PARTIAL COLECTOMY  2005  . RADIOACTIVE SEED IMPLANT N/A 12/06/2017   Procedure: RADIOACTIVE SEED IMPLANT/BRACHYTHERAPY IMPLANT;  Surgeon: Hollice Espy, MD;  Location: [facility];  Service: Urology;  Laterality: N/A;  . UPPER EXTREMITY ANGIOGRAPHY Left 04/18/2020   Procedure: UPPER EXTREMITY ANGIOGRAPHY;  Surgeon: Algernon Huxley, MD;  Location: [facility];  Service: Cardiovascular;  Laterality: Left;   Social History Social History   Tobacco Use  . Smoking status: Never Smoker  . Smokeless tobacco: Never Used  Vaping Use  . Vaping Use: Never used  Substance Use Topics  . Alcohol use: No    Alcohol/week: 0.0 standard drinks  . Drug use: No   Family History Family History  Problem Relation Age of Onset  . Diabetes Mother   . Heart disease Mother   . Hypertension Mother   . Glaucoma Mother   . Prostate cancer Father   . Diabetes Maternal Grandmother   . Diabetes Maternal Grandfather   . Prostate cancer Maternal Uncle   No family history of bleeding or clotting disorders, autoimmune disease or porphyria  Allergies  Allergen Reactions  . Glimepiride Other (See Comments)    Severe hypoglycemia  . Other   . Percocet [Oxycodone-Acetaminophen] Nausea And Vomiting   REVIEW OF SYSTEMS (Negative unless checked)  Constitutional: [] Weight loss  [] Fever  [] Chills Cardiac: [] Chest pain   [] Chest pressure   [] Palpitations   [] Shortness of breath  when laying flat   [] Shortness of breath at rest   [x] Shortness of breath with exertion. Vascular:  [] Pain in legs with walking   [] Pain in legs at rest   [] Pain in legs when laying flat   [] Claudication   [] Pain in feet when walking  [] Pain in feet at rest  [] Pain in feet when laying flat   [] History of DVT   [] Phlebitis   [] Swelling in legs   [] Varicose veins   [] Non-healing  ulcers Pulmonary:   [] Uses home oxygen   [] Productive cough   [] Hemoptysis   [] Wheeze  [] COPD   [] Asthma Neurologic:  [] Dizziness  [] Blackouts   [] Seizures   [] History of stroke   [] History of TIA  [] Aphasia   [] Temporary blindness   [] Dysphagia   [] Weakness or numbness in arms   [] Weakness or numbness in legs Musculoskeletal:  [] Arthritis   [] Joint swelling   [] Joint pain   [] Low back pain Hematologic:  [] Easy bruising  [] Easy bleeding   [] Hypercoagulable state   [] Anemic  [] Hepatitis Gastrointestinal:  [] Blood in stool   [] Vomiting blood  [] Gastroesophageal reflux/heartburn   [] Difficulty swallowing. Genitourinary:  [x] Chronic kidney disease   [] Difficult urination  [] Frequent urination  [] Burning with urination   [] Blood in urine Skin:  [] Rashes   [] Ulcers   [] Wounds Psychological:  [] History of anxiety   []  History of major depression.  Physical Examination  Vitals:   10/07/20 1031  BP: (!) 145/89  Pulse: 82  Resp: 18  Temp: 98.5 F (36.9 C)  TempSrc: Oral  SpO2: 100%   There is no height or weight on file to calculate BMI. Gen: WD/WN, NAD Head: [place]/AT, No temporalis wasting. Prominent temp pulse not noted. Ear/Nose/Throat: Hearing grossly intact, nares w/o erythema or drainage, oropharynx w/o Erythema/Exudate,  Eyes: Conjunctiva clear, sclera non-icteric Neck: Trachea midline.  No JVD.  Pulmonary:  Good air movement, respirations not labored, no use of accessory muscles.  Cardiac: RRR, normal S1, S2. Vascular:  Vessel Right Left  Radial Palpable Palpable  Ulnar Not Palpable Not Palpable  Brachial Palpable Palpable  Carotid Palpable, without bruit Palpable, without bruit   Right IJ PermCath: Intact.  No signs of infection.  Gastrointestinal: soft, non-tender/non-distended. No guarding/reflex.  Musculoskeletal: M/S 5/5 throughout.  Extremities without ischemic changes.  No deformity or atrophy.  Neurologic: Sensation grossly intact in extremities.  Symmetrical.  Speech is  fluent. Motor exam as listed above. Psychiatric: Judgment intact, Mood & affect appropriate for pt's clinical situation. Dermatologic: No rashes or ulcers noted.  No cellulitis or open wounds. Lymph : No Cervical, Axillary, or Inguinal lymphadenopathy.   CBC Lab Results  Component Value Date   WBC 7.9 03/06/2020   HGB 8.2 (L) 03/07/2020   HCT 24.0 (L) 03/07/2020   MCV 90.4 03/06/2020   PLT 331 03/06/2020   BMET    Component Value Date/Time   NA 139 03/07/2020 1056   NA 142 11/27/2019 1526   NA 143 02/10/2012 1850   K 3.4 (L) 03/07/2020 1056   K 3.8 02/10/2012 1850   CL 97 (L) 03/07/2020 1056   CL 105 02/10/2012 1850   CO2 25 03/06/2020 1001   CO2 30 02/10/2012 1850   GLUCOSE 120 (H) 03/07/2020 1056   GLUCOSE 127 (H) 02/10/2012 1850   BUN 21 (H) 03/07/2020 1056   BUN 56 (H) 11/27/2019 1526   BUN 11 02/10/2012 1850   CREATININE 6.90 (H) 03/07/2020 1056   CREATININE 1.17 02/10/2012 1850   CALCIUM 9.3 03/06/2020 1001  CALCIUM 8.8 02/10/2012 1850   GFRNONAA 7 (L) 03/06/2020 1001   GFRNONAA >60 02/10/2012 1850   GFRAA 9 (L) 03/06/2020 1001   GFRAA >60 02/10/2012 1850   CrCl cannot be calculated (Patient's most recent lab result is older than the maximum 21 days allowed.).  COAG Lab Results  Component Value Date   INR 1.1 03/06/2020   Radiology NM PET (F18-PYLARIFY) SKULL TO MID THIGH  Result Date: 09/25/2020 CLINICAL DATA:  Prostate carcinoma with biochemical recurrence. Increasing PSA after radiation treatment. Prostate carcinoma. Seed implants 2019. EXAM: NUCLEAR MEDICINE PET SKULL BASE TO THIGH TECHNIQUE: 9.4 mCi F18 Piflufolastat (Pylarify) was injected intravenously. Full-ring PET imaging was performed from the skull base to thigh after the radiotracer. CT data was obtained and used for attenuation correction and anatomic localization. COMPARISON:  None available FINDINGS: NECK No radiotracer activity in neck lymph nodes. Incidental CT finding: None CHEST No  radiotracer accumulation within mediastinal or hilar lymph nodes. No suspicious pulmonary nodules on the CT scan. Incidental CT finding: Central venous line with tip in distal SVC. ABDOMEN/PELVIS Prostate: Focus radiotracer activity in the posterior RIGHT prostate gland just off of midline with SUV max equal 10.0 (image 64). Lesion is relatively small measuring approximately 10 mm. Lesion is slightly posterior to the brachytherapy seed pattern. Lymph nodes: No abnormal radiotracer accumulation within pelvic or abdominal nodes. Liver: No evidence of liver metastasis Incidental CT finding: Small midline ventral hernia contains peritoneal fat. SKELETON No focal  activity to suggest skeletal metastasis. IMPRESSION: 1. Focal radiotracer activity in the posterior RIGHT prostate gland concerning for local prostate cancer recurrence. 2. No evidence of nodal metastasis in the pelvis. 3. No visceral metastasis or skeletal metastasis Electronically Signed   By: Suzy Bouchard M.D.   On: 09/25/2020 12:46   Assessment/Plan 1.  End-stage renal disease requiring hemodialysis: The patient has an extremity access that is functioning well. Therefore, the patient will undergo removal of the tunneled catheter under local anesthesia.  The risks and benefits were described to the patient.  All questions were answered.  The patient agrees to proceed with angiography and intervention. Potassium will be drawn to ensure that it is an appropriate level prior to performing intervention. Patient will continue dialysis therapy without further interruption if a successful intervention is not achieved then a tunneled catheter will be placed. Dialysis has already been arranged. 2.  Hypertension:  Patient will continue medical management; nephrology is following no changes in oral medications. 3. Diabetes mellitus:  Glucose will be monitored and oral medications been held this morning once the patient has undergone the patient's procedure po  intake will be reinitiated and again Accu-Cheks will be used to assess the blood glucose level and treat as needed. The patient will be restarted on the patient's usual hypoglycemic regime  Discussed with Dr. Mayme Genta, PA-C  10/07/2020 11:09 AM

## 2020-10-07 NOTE — Discharge Instructions (Signed)
Tunneled Catheter Removal, Care After Refer to this sheet in the next few weeks. These instructions provide you with information about caring for yourself after your procedure. Your health care provider may also give you more specific instructions. Your treatment has been planned according to current medical practices, but problems sometimes occur. Call your health care provider if you have any problems or questions after your procedure. What can I expect after the procedure? After the procedure, it is common to have: Some mild redness, swelling, and pain around your catheter site.   Follow these instructions at home: Incision care  Check your removal site  every day for signs of infection. Check for: More redness, swelling, or pain. More fluid or blood. Warmth. Pus or a bad smell. Remove your dressing in 48hrs leave open to air  Activity  Return to your normal activities as told by your health care provider. Ask your health care provider what activities are safe for you. Do not lift anything that is heavier than 10 lb (4.5 kg) for 3 days  You may shower tomorrow  Contact a health care provider if: You have more fluid or blood coming from your removal site You have more redness, swelling, or pain at your incisions or around the area where your catheter was removed Your removal site feel warm to the touch. You feel unusually weak. You feel nauseous.. Get help right away if You have swelling in your arm, shoulder, neck, or face. You develop chest pain. You have difficulty breathing. You feel dizzy or light-headed. You have pus or a bad smell coming from your removal site You have a fever. You develop bleeding from your removal site, and your bleeding does not stop. This information is not intended to replace advice given to you by your health care provider. Make sure you discuss any questions you have with your health care provider. Document Released: 07/13/2012 Document Revised:  03/29/2016 Document Reviewed: 04/22/2015 Elsevier Interactive Patient Education  2017 Elsevier Inc[employer] 

## 2020-10-07 NOTE — Op Note (Signed)
Operative Note  Preoperative diagnosis:    1. ESRD with functional permanent access  Postoperative diagnosis:    1. ESRD with functional permanent access  Procedure:  Removal of RIGHT Permcath  Performed By: Hezzie Bump PA-C  Surgeon:  [name], MD  Anesthesia:  Local  EBL:  Minimal  Indication for the Procedure:  The patient has a functional permanent dialysis access and no longer needs their permcath.  This can be removed.  Risks and benefits are discussed and informed consent is obtained.  Description of the Procedure:  The patient's RIGHT neck, chest and existing catheter were sterilely prepped and draped. The area around the catheter was anesthetized copiously with 1% lidocaine. The catheter was dissected out with curved hemostats until the cuff was freed from the surrounding fibrous sheath. The fiber sheath was transected, and the catheter was then removed in its entirety using gentle traction. Pressure was held and sterile dressings were placed. The patient tolerated the procedure well and was taken to the recovery room in stable condition.  Shulamis Wenberg A Nikai Quest  10/07/2020, 11:22 AM  This note was created with Dragon Medical transcription system. Any errors in dictation are purely unintentional.

## 2020-10-09 ENCOUNTER — Ambulatory Visit (INDEPENDENT_AMBULATORY_CARE_PROVIDER_SITE_OTHER): Payer: 59 | Admitting: Urology

## 2020-10-09 ENCOUNTER — Other Ambulatory Visit: Payer: Self-pay

## 2020-10-09 ENCOUNTER — Encounter: Payer: Self-pay | Admitting: Urology

## 2020-10-09 VITALS — BP 113/79 | HR 103 | Ht 72.0 in | Wt 250.0 lb

## 2020-10-09 DIAGNOSIS — R9721 Rising PSA following treatment for malignant neoplasm of prostate: Secondary | ICD-10-CM | POA: Diagnosis not present

## 2020-10-09 DIAGNOSIS — R310 Gross hematuria: Secondary | ICD-10-CM | POA: Diagnosis not present

## 2020-10-09 DIAGNOSIS — R350 Frequency of micturition: Secondary | ICD-10-CM | POA: Diagnosis not present

## 2020-10-10 ENCOUNTER — Other Ambulatory Visit: Payer: Medicare Other

## 2020-10-10 DIAGNOSIS — R310 Gross hematuria: Secondary | ICD-10-CM

## 2020-10-11 ENCOUNTER — Encounter: Payer: Self-pay | Admitting: Urology

## 2020-10-11 LAB — URINALYSIS, COMPLETE
Bilirubin, UA: NEGATIVE
Glucose, UA: NEGATIVE
Ketones, UA: NEGATIVE
Nitrite, UA: NEGATIVE
Specific Gravity, UA: 1.02 (ref 1.005–1.030)
Urobilinogen, Ur: 0.2 mg/dL (ref 0.2–1.0)
pH, UA: 5.5 (ref 5.0–7.5)

## 2020-10-11 LAB — MICROSCOPIC EXAMINATION: RBC, Urine: >30 /hpf — AB (ref 0–2)

## 2020-10-11 MED ORDER — CEPHALEXIN 500 MG PO CAPS: 500.0000 mg | ORAL_CAPSULE | Freq: Four times a day (QID) | ORAL | 0 refills | Status: DC

## 2020-10-11 NOTE — Telephone Encounter (Addendum)
Left VM to return call with any questions-sent MyChart message as well.  ----- Message from Hollice Espy, MD sent at 10/11/2020 11:15 AM EST ----- Lets go ahead and treat this like it is a possible infection.  Please prescribe him Keflex 500 mg 4 times a day x7 days.  I had like to see him back in 2 weeks for urine recheck.  Please have him swing by and pick up a sterile cup to ensure that he will be able to provide a sample on that day.  Hollice Espy, MD

## 2020-10-11 NOTE — Progress Notes (Signed)
10/09/2020 11:13 AM   Benjamin Stewart 10-27-1975 [id number]  Referring provider: Glean Hess, MD [address] [place],  [place] [address]  Chief Complaint  Patient presents with  . Follow-up    HPI: 45 year old male with a personal history of prostate cancer status post brachytherapy he returns today with rising PSA with PSMA PET scan.  He has a personal history of Gleason 3+4 intermediate risk prostate cancer, initially diagnosed in 2017 on active surveillance with a PSA which subsequently rose to 8.4 at the time of treatment. He elected to pursue I-125 brachytherapy prostate seed implantcompleted on 11/2017.  PSA has risen 4.02   PET scan on 2/22 shows focal radiotracer activity in the posterior right prostate gland.  No other evidence of metastatic disease.  He also mentions today that he has been having some gross hematuria.  He has some mild discomfort with voiding.  This is a new problem.  No fevers or chills.  No flank pain.   He is on hemodialysis and unable to leave a urine sample today.  PMH: Past Medical History:  Diagnosis Date  . Anemia   . Benign essential hematuria 12/04/2014   microscopic hematuria evaluation with CT and cysto - treated for 2 months with bactrim DS; no further f/u planned unless hematuria recurs.   . Chronic kidney disease   . Controlled type 2 diabetes mellitus without complication, without long-term current use of insulin ([place]) 07/16/2015  . COVID-19 virus infection 12/29/2019  . Essential (primary) hypertension 12/04/2014  . History of kidney stones    H/O  . prostate cancer    PROSTATE   . Sleep apnea    DOES NOT USE CPAP    Surgical History: Past Surgical History:  Procedure Laterality Date  . A/V FISTULAGRAM Left 05/30/2020   Procedure: A/V FISTULAGRAM;  Surgeon: Algernon Huxley, MD;  Location: [facility];  Service: Cardiovascular;  Laterality: Left;  . AV FISTULA PLACEMENT Left 03/07/2020   Procedure:  ARTERIOVENOUS (AV) FISTULA CREATION BRACHIAL CEPHALIC;  Surgeon: Algernon Huxley, MD;  Location: [facility];  Service: Vascular;  Laterality: Left;  . DIALYSIS/PERMA CATHETER INSERTION N/A 01/17/2020   Procedure: DIALYSIS/PERMA CATHETER INSERTION;  Surgeon: Algernon Huxley, MD;  Location: [facility];  Service: Cardiovascular;  Laterality: N/A;  . DIALYSIS/PERMA CATHETER REMOVAL N/A 10/07/2020   Procedure: DIALYSIS/PERMA CATHETER REMOVAL;  Surgeon: Algernon Huxley, MD;  Location: [facility];  Service: Cardiovascular;  Laterality: N/A;  . PARTIAL COLECTOMY  2005  . RADIOACTIVE SEED IMPLANT N/A 12/06/2017   Procedure: RADIOACTIVE SEED IMPLANT/BRACHYTHERAPY IMPLANT;  Surgeon: Hollice Espy, MD;  Location: [facility];  Service: Urology;  Laterality: N/A;  . UPPER EXTREMITY ANGIOGRAPHY Left 04/18/2020   Procedure: UPPER EXTREMITY ANGIOGRAPHY;  Surgeon: Algernon Huxley, MD;  Location: [facility];  Service: Cardiovascular;  Laterality: Left;    Home Medications:  Allergies as of 10/09/2020      Reactions   Glimepiride Other (See Comments)   Severe hypoglycemia   Other    Percocet [oxycodone-acetaminophen] Nausea And Vomiting      Medication List       Accurate as of October 09, 2020 11:59 PM. If you have any questions, ask your nurse or doctor.        STOP taking these medications   cloNIDine 0.2 mg/24hr patch Commonly known as: CATAPRES - Dosed in mg/24 hr Stopped by: Hollice Espy, MD   sodium bicarbonate 650 MG  tablet Stopped by: Hollice Espy, MD   traMADol 50 MG tablet Commonly known as: ULTRAM Stopped by: Hollice Espy, MD     TAKE these medications   amLODipine 10 MG tablet Commonly known as: NORVASC TAKE 1 TABLET BY MOUTH EVERY DAY   aspirin EC 81 MG tablet Take 1 tablet (81 mg total) by mouth daily.   atorvastatin 10 MG tablet Commonly known as: LIPITOR TAKE 1 TABLET BY MOUTH EVERY DAY WITH BREAKFAST   calcitRIOL 0.25 MCG capsule Commonly known as:  ROCALTROL TAKE 1 CAPSULE(0.25 MCG) BY MOUTH DAILY   carvedilol 6.25 MG tablet Commonly known as: COREG Take 6.25 mg by mouth 2 (two) times daily with a meal.   clopidogrel 75 MG tablet Commonly known as: Plavix Take 1 tablet (75 mg total) by mouth daily.   hydrALAZINE 100 MG tablet Commonly known as: APRESOLINE Take 100 mg by mouth 3 (three) times daily.   sevelamer carbonate 800 MG tablet Commonly known as: RENVELA Take 1 tablet (800 mg total) by mouth 2 (two) times daily with a meal.       Allergies:  Allergies  Allergen Reactions  . Glimepiride Other (See Comments)    Severe hypoglycemia  . Other   . Percocet [Oxycodone-Acetaminophen] Nausea And Vomiting    Family History: Family History  Problem Relation Age of Onset  . Diabetes Mother   . Heart disease Mother   . Hypertension Mother   . Glaucoma Mother   . Prostate cancer Father   . Diabetes Maternal Grandmother   . Diabetes Maternal Grandfather   . Prostate cancer Maternal Uncle     Social History:  reports that he has never smoked. He has never used smokeless tobacco. He reports that he does not drink alcohol and does not use drugs.   Physical Exam: BP 113/79   Pulse (!) 103   Ht 6' (1.829 m)   Wt 250 lb (113.4 kg)   BMI 33.91 kg/m   Constitutional:  Alert and oriented, No acute distress. HEENT:  AT, moist mucus membranes.  Trachea midline, no masses. Cardiovascular: No clubbing, cyanosis, or edema. Respiratory: Normal respiratory effort, no increased work of breathing. Skin: No rashes, bruises or suspicious lesions. Neurologic: Grossly intact, no focal deficits, moving all 4 extremities. Psychiatric: Normal mood and affect.  Imaging: Narrative & Impression  CLINICAL DATA:  Prostate carcinoma with biochemical recurrence. Increasing PSA after radiation treatment. Prostate carcinoma. Seed implants 2019.  EXAM: NUCLEAR MEDICINE PET SKULL BASE TO THIGH  TECHNIQUE: 9.4 mCi F18  Piflufolastat (Pylarify) was injected intravenously. Full-ring PET imaging was performed from the skull base to thigh after the radiotracer. CT data was obtained and used for attenuation correction and anatomic localization.  COMPARISON:  None available  FINDINGS: NECK  No radiotracer activity in neck lymph nodes.  Incidental CT finding: None  CHEST  No radiotracer accumulation within mediastinal or hilar lymph nodes. No suspicious pulmonary nodules on the CT scan.  Incidental CT finding: Central venous line with tip in distal SVC.  ABDOMEN/PELVIS  Prostate: Focus radiotracer activity in the posterior RIGHT prostate gland just off of midline with SUV max equal 10.0 (image 64). Lesion is relatively small measuring approximately 10 mm. Lesion is slightly posterior to the brachytherapy seed pattern.  Lymph nodes: No abnormal radiotracer accumulation within pelvic or abdominal nodes.  Liver: No evidence of liver metastasis  Incidental CT finding: Small midline ventral hernia contains peritoneal fat.  SKELETON  No focal  activity to suggest skeletal metastasis.  IMPRESSION: 1. Focal radiotracer activity in the posterior RIGHT prostate gland concerning for local prostate cancer recurrence. 2. No evidence of nodal metastasis in the pelvis. 3. No visceral metastasis or skeletal metastasis   Electronically Signed   By: Suzy Bouchard M.D.   On: 09/25/2020 12:46     Assessment & Plan:    1. Gross hematuria No gross hematuria  Unable to give a urine sample today, given cup and will bring one in tomorrow.  Will rule out infection.  If no infection, will consider cystoscopy to work this up further.  He is agreeable this plan.  - Urinalysis, Complete; Future - CULTURE, URINE COMPREHENSIVE; Future  2. Prostate cancer PET scan with possible focal recurrence in the setting of rising PSA  We discussed consideration of biopsy to confirm this  diagnosis.  Given that the possible recurrence appears to be focal, could consider option of focal ablative therapies such as HIFU and/or cryo.  These are not performed in the community setting.  We will refer patient to [facility] (Dr. Rutherford Limerick) and hold off on biopsy here, defer whether or not to perform this to treating urologist.  We will work-up hematuria first and then facilitate referral.   Hollice Espy, MD  [facility] [address], [place], [place] [address] [phone]

## 2020-10-13 LAB — CULTURE, URINE COMPREHENSIVE

## 2020-10-24 ENCOUNTER — Other Ambulatory Visit: Payer: Self-pay

## 2020-10-24 ENCOUNTER — Other Ambulatory Visit: Payer: Medicare Other

## 2020-10-24 DIAGNOSIS — R310 Gross hematuria: Secondary | ICD-10-CM

## 2020-10-24 LAB — URINALYSIS, COMPLETE
Bilirubin, UA: NEGATIVE
Glucose, UA: NEGATIVE
Nitrite, UA: NEGATIVE
Specific Gravity, UA: 1.025 (ref 1.005–1.030)
Urobilinogen, Ur: 0.2 mg/dL (ref 0.2–1.0)
pH, UA: 5.5 (ref 5.0–7.5)

## 2020-10-24 LAB — MICROSCOPIC EXAMINATION: WBC, UA: >30 /hpf — AB (ref 0–5)

## 2020-10-25 ENCOUNTER — Telehealth: Payer: Self-pay

## 2020-10-25 NOTE — Telephone Encounter (Signed)
Notified patient as instructed, patient scheduled cysto for 11/19/2020

## 2020-10-25 NOTE — Telephone Encounter (Signed)
Called pt regarding his UA from 10/24/2020, still having RBC. Left message for pt to return my call to schedule cystoscopy as per Dr. Erlene Quan.

## 2020-10-25 NOTE — Telephone Encounter (Signed)
Pt LVM on triage line stating he is returning a call. Called pt back no answer.

## 2020-10-31 ENCOUNTER — Other Ambulatory Visit: Payer: Self-pay

## 2020-10-31 ENCOUNTER — Other Ambulatory Visit: Payer: Self-pay | Admitting: *Deleted

## 2020-10-31 ENCOUNTER — Ambulatory Visit
Admission: RE | Admit: 2020-10-31 | Discharge: 2020-10-31 | Disposition: A | Discharge status: Home / Self Care | Payer: Medicare Other | Source: Ambulatory Visit | Attending: Radiation Oncology | Admitting: Radiation Oncology

## 2020-10-31 ENCOUNTER — Encounter: Payer: Self-pay | Admitting: Radiation Oncology

## 2020-10-31 VITALS — BP 132/87 | HR 87 | Temp 97.9°F | Resp 16 | Wt 260.5 lb

## 2020-10-31 DIAGNOSIS — C61 Malignant neoplasm of prostate: Secondary | ICD-10-CM

## 2020-10-31 NOTE — Progress Notes (Signed)
Radiation Oncology Follow up Note  Name: Benjamin Stewart   Date:   10/31/2020 MRN:  [id number] DOB: 1976/01/29    This 45 y.o. male presents to the clinic today for 3-year follow-up status post I-125 interstitial implant for stage IIa adenocarcinoma the prostate.  REFERRING PROVIDER: Glean Hess, MD  HPI: Patient is a 45 year old male with end-stage renal disease on dialysis.  He is now 3 years out having completed I-125 interstitial implant for Gleason 7 (3+4) adenocarcinoma the prostate presenting with a PSA of 8.4.  His PSA has been trending.  Upwards most recently 1 month ago 5.7.  PSA never dropped below 2.0.  He had a recent Axium PET CT scan showing some hypermetabolic activity in the base of the prostate although on my review this looks like it is involving the wall of the rectum.  He is having no rectal pain or discomfort or bone pain.  He is to be referred to [facility] for second opinion on treatment options by Dr. Erlene Quan who is also following the patient.  COMPLICATIONS OF TREATMENT: none  FOLLOW UP COMPLIANCE: keeps appointments   PHYSICAL EXAM:  BP 132/87   Pulse 87   Temp 97.9 F (36.6 C)   Resp 16   Wt 260 lb 8 oz (118.2 kg)   SpO2 98%   BMI 35.33 kg/m  Well-developed well-nourished patient in NAD. HEENT reveals PERLA, EOMI, discs not visualized.  Oral cavity is clear. No oral mucosal lesions are identified. Neck is clear without evidence of cervical or supraclavicular adenopathy. Lungs are clear to A&P. Cardiac examination is essentially unremarkable with regular rate and rhythm without murmur rub or thrill. Abdomen is benign with no organomegaly or masses noted. Motor sensory and DTR levels are equal and symmetric in the upper and lower extremities. Cranial nerves II through XII are grossly intact. Proprioception is intact. No peripheral adenopathy or edema is identified. No motor or sensory levels are noted. Crude visual fields are within normal range.  RADIOLOGY  RESULTS: PET CT scan reviewed compatible with above-stated findings no other evidence of distant metastatic disease is noted.  There is a slight hypermetabolic activity in the left symphysis pubis.  PLAN: At this time we will try to firm up his referral to [facility] for second opinion.  I am also referring patient to medical oncology for initiation of Eligard as well as any other agents at this time for early stage biochemical progression of prostate cancer.  I have asked to see him back in 6 months for follow-up.  He continues close follow-up care with Dr. Erlene Quan.  Patient knows to call with any concerns.  I would like to take this opportunity to thank you for allowing me to participate in the care of your patient.Noreene Filbert, MD

## 2020-11-01 ENCOUNTER — Telehealth: Payer: Self-pay | Admitting: *Deleted

## 2020-11-01 ENCOUNTER — Other Ambulatory Visit: Payer: Self-pay | Admitting: *Deleted

## 2020-11-01 DIAGNOSIS — C61 Malignant neoplasm of prostate: Secondary | ICD-10-CM

## 2020-11-01 NOTE — Telephone Encounter (Signed)
Called patient to let him know Dr. Baruch Gouty has ordered an MRI of his pelvis and I am waiting on insurance auth prior to scheduling.   No answer, VM left.   Instructed patient to call back with any questions.

## 2020-11-05 ENCOUNTER — Other Ambulatory Visit: Payer: Self-pay | Admitting: *Deleted

## 2020-11-05 ENCOUNTER — Inpatient Hospital Stay: Payer: 59 | Attending: Oncology | Admitting: Oncology

## 2020-11-05 ENCOUNTER — Inpatient Hospital Stay: Payer: 59

## 2020-11-05 VITALS — BP 135/90 | HR 84 | Temp 98.1°F | Wt 271.4 lb

## 2020-11-05 DIAGNOSIS — C61 Malignant neoplasm of prostate: Secondary | ICD-10-CM

## 2020-11-05 DIAGNOSIS — Z7982 Long term (current) use of aspirin: Secondary | ICD-10-CM | POA: Insufficient documentation

## 2020-11-05 DIAGNOSIS — Z8616 Personal history of COVID-19: Secondary | ICD-10-CM | POA: Diagnosis not present

## 2020-11-05 DIAGNOSIS — Z6835 Body mass index (BMI) 35.0-35.9, adult: Secondary | ICD-10-CM | POA: Diagnosis not present

## 2020-11-05 DIAGNOSIS — E669 Obesity, unspecified: Secondary | ICD-10-CM | POA: Diagnosis not present

## 2020-11-05 DIAGNOSIS — Z992 Dependence on renal dialysis: Secondary | ICD-10-CM | POA: Diagnosis not present

## 2020-11-05 DIAGNOSIS — E119 Type 2 diabetes mellitus without complications: Secondary | ICD-10-CM | POA: Diagnosis not present

## 2020-11-05 DIAGNOSIS — I120 Hypertensive chronic kidney disease with stage 5 chronic kidney disease or end stage renal disease: Secondary | ICD-10-CM | POA: Insufficient documentation

## 2020-11-05 DIAGNOSIS — N186 End stage renal disease: Secondary | ICD-10-CM | POA: Insufficient documentation

## 2020-11-05 DIAGNOSIS — Z7189 Other specified counseling: Secondary | ICD-10-CM | POA: Diagnosis not present

## 2020-11-05 DIAGNOSIS — Z79899 Other long term (current) drug therapy: Secondary | ICD-10-CM | POA: Diagnosis not present

## 2020-11-05 LAB — PSA: Prostatic Specific Antigen: 6.2 ng/mL — ABNORMAL HIGH (ref 0.00–4.00)

## 2020-11-06 ENCOUNTER — Encounter: Payer: Self-pay | Admitting: Oncology

## 2020-11-06 LAB — TESTOSTERONE: Testosterone: 438 ng/dL (ref 264–916)

## 2020-11-06 NOTE — Progress Notes (Signed)
Hematology/Oncology Consult note [facility] Telephone:([phone] [phone]  Patient Care Team: Glean Hess, MD as PCP - General (Internal Medicine) Nickie Retort, MD (Inactive) as Consulting Physician (Urology) Dagoberto Ligas, MD as Referring Physician (Nephrology)   Name of the patient: Benjamin Stewart  [id number]  [birth year]    Reason for referral-nonmetastatic prostate cancer   Referring physician-Dr. Baruch Gouty  Date of visit: 11/06/20   History of presenting illness- Patient is a 45 year old male with past medical history significant for hypertension diabetes, obesity and end-stage renal disease on dialysis.  He has a history of Gleason 73+4 adenocarcinoma of the prostate that was diagnosed in 2019 for which he underwent I-125 interstitial implant with radiation oncology.  He has not had any prostate surgery for that.  He has never been on ADT so far.  More recently his PSA has been steadily going up since March 2020.  It was 1.4 in March 2020, 2.4 in March 2021, 3.8 in September 2021 and 5.7 in January 2022.  Patient has seen Dr. Erlene Quan as well as Dr. Donella Stade and will be having an appointment at [facility] for second opinion at some point.  He also had a pylarify PET scan which showed a focus of radiotracer activity in the posterior right prostate gland just off midline with an SUV of 10.  Lesion measuring 10 mm.  No abnormal lymph nodes seen.  No evidence of visceral or skeletal metastases.  Patient has been referred to medical oncology for consideration of ADT.  Patient reports doing well overall.  Appetite and weight have remained stable.  Denies any new aches and pains anywhere.   ECOG PS- 1  Pain scale- 0   Review of systems- Review of Systems  Constitutional: Positive for malaise/fatigue. Negative for chills, fever and weight loss.  HENT: Negative for congestion, ear discharge and nosebleeds.   Eyes: Negative for blurred vision.   Respiratory: Negative for cough, hemoptysis, sputum production, shortness of breath and wheezing.   Cardiovascular: Negative for chest pain, palpitations, orthopnea and claudication.  Gastrointestinal: Negative for abdominal pain, blood in stool, constipation, diarrhea, heartburn, melena, nausea and vomiting.  Genitourinary: Negative for dysuria, flank pain, frequency, hematuria and urgency.  Musculoskeletal: Negative for back pain, joint pain and myalgias.  Skin: Negative for rash.  Neurological: Negative for dizziness, tingling, focal weakness, seizures, weakness and headaches.  Endo/Heme/Allergies: Does not bruise/bleed easily.  Psychiatric/Behavioral: Negative for depression and suicidal ideas. The patient does not have insomnia.     Allergies  Allergen Reactions  . Glimepiride Other (See Comments)    Severe hypoglycemia  . Other   . Percocet [Oxycodone-Acetaminophen] Nausea And Vomiting    Patient Active Problem List   Diagnosis Date Noted  . End stage renal disease ([place]) 04/11/2020  . Anemia 12/29/2019  . Postural dizziness with presyncope 11/09/2019  . Secondary hyperparathyroidism of renal origin ([place]) 04/24/2019  . Hyperkalemia 04/24/2019  . Localized edema 01/19/2019  . Chronic gout of multiple sites 08/16/2018  . Prostate cancer ([place]) 08/23/2015  . Hyperlipidemia, mixed 07/16/2015  . Essential (primary) hypertension 12/04/2014  . BMI 35.0-35.9,adult 12/04/2014  . Malignant essential hypertension 12/04/2014     Past Medical History:  Diagnosis Date  . Anemia   . Benign essential hematuria 12/04/2014   microscopic hematuria evaluation with CT and cysto - treated for 2 months with bactrim DS; no further f/u planned unless hematuria recurs.   . Chronic kidney disease   . Controlled type 2 diabetes mellitus  without complication, without long-term current use of insulin ([place]) 07/16/2015  . COVID-19 virus infection 12/29/2019  . Essential (primary) hypertension  12/04/2014  . History of kidney stones    H/O  . prostate cancer    PROSTATE   . Sleep apnea    DOES NOT USE CPAP     Past Surgical History:  Procedure Laterality Date  . A/V FISTULAGRAM Left 05/30/2020   Procedure: A/V FISTULAGRAM;  Surgeon: Algernon Huxley, MD;  Location: [place] [facility];  Service: Cardiovascular;  Laterality: Left;  . AV FISTULA PLACEMENT Left 03/07/2020   Procedure: ARTERIOVENOUS (AV) FISTULA CREATION BRACHIAL CEPHALIC;  Surgeon: Algernon Huxley, MD;  Location: [facility];  Service: Vascular;  Laterality: Left;  . DIALYSIS/PERMA CATHETER INSERTION N/A 01/17/2020   Procedure: DIALYSIS/PERMA CATHETER INSERTION;  Surgeon: Algernon Huxley, MD;  Location: [place] [facility];  Service: Cardiovascular;  Laterality: N/A;  . DIALYSIS/PERMA CATHETER REMOVAL N/A 10/07/2020   Procedure: DIALYSIS/PERMA CATHETER REMOVAL;  Surgeon: Algernon Huxley, MD;  Location: [place] [facility];  Service: Cardiovascular;  Laterality: N/A;  . PARTIAL COLECTOMY  2005  . RADIOACTIVE SEED IMPLANT N/A 12/06/2017   Procedure: RADIOACTIVE SEED IMPLANT/BRACHYTHERAPY IMPLANT;  Surgeon: Hollice Espy, MD;  Location: [facility];  Service: Urology;  Laterality: N/A;  . UPPER EXTREMITY ANGIOGRAPHY Left 04/18/2020   Procedure: UPPER EXTREMITY ANGIOGRAPHY;  Surgeon: Algernon Huxley, MD;  Location: [place] [facility];  Service: Cardiovascular;  Laterality: Left;    Social History   Socioeconomic History  . Marital status: Divorced    Spouse name: Not on file  . Number of children: Not on file  . Years of education: Not on file  . Highest education level: Not on file  Occupational History  . Not on file  Tobacco Use  . Smoking status: Never Smoker  . Smokeless tobacco: Never Used  Vaping Use  . Vaping Use: Never used  Substance and Sexual Activity  . Alcohol use: No    Alcohol/week: 0.0 standard drinks  . Drug use: No  . Sexual activity: Not on file  Other Topics Concern  . Not on file  Social History  Narrative  . Not on file   Social Determinants of Health   Financial Resource Strain: Not on file  Food Insecurity: Not on file  Transportation Needs: Not on file  Physical Activity: Not on file  Stress: Not on file  Social Connections: Not on file  Intimate Partner Violence: Not on file     Family History  Problem Relation Age of Onset  . Diabetes Mother   . Heart disease Mother   . Hypertension Mother   . Glaucoma Mother   . Prostate cancer Father   . Diabetes Maternal Grandmother   . Diabetes Maternal Grandfather   . Prostate cancer Maternal Uncle      Current Outpatient Medications:  .  amLODipine (NORVASC) 10 MG tablet, TAKE 1 TABLET BY MOUTH EVERY DAY, Disp: 90 tablet, Rfl: 0 .  aspirin EC 81 MG tablet, Take 1 tablet (81 mg total) by mouth daily., Disp: 150 tablet, Rfl: 2 .  atorvastatin (LIPITOR) 10 MG tablet, TAKE 1 TABLET BY MOUTH EVERY DAY WITH BREAKFAST, Disp: 90 tablet, Rfl: 3 .  calcitRIOL (ROCALTROL) 0.25 MCG capsule, TAKE 1 CAPSULE(0.25 MCG) BY MOUTH DAILY, Disp: 90 capsule, Rfl: 1 .  carvedilol (COREG) 6.25 MG tablet, Take 6.25 mg by mouth 2 (two) times daily with a meal. , Disp: , Rfl:  .  clopidogrel (PLAVIX) 75 MG tablet, Take 1 tablet (75 mg total) by mouth daily., Disp: 30 tablet, Rfl: 11 .  sevelamer carbonate (RENVELA) 800 MG tablet, Take 1 tablet (800 mg total) by mouth 2 (two) times daily with a meal., Disp: 60 tablet, Rfl: 1   Physical exam:  Vitals:   11/05/20 1518  BP: 135/90  Pulse: 84  Temp: 98.1 F (36.7 C)  TempSrc: Tympanic  SpO2: 100%  Weight: 271 lb 6.4 oz (123.1 kg)   Physical Exam Constitutional:      General: He is not in acute distress. Cardiovascular:     Rate and Rhythm: Normal rate and regular rhythm.     Heart sounds: Normal heart sounds.  Pulmonary:     Effort: Pulmonary effort is normal.     Breath sounds: Normal breath sounds.  Abdominal:     General: Bowel sounds are normal.     Palpations: Abdomen is soft.   Skin:    General: Skin is warm and dry.  Neurological:     Mental Status: He is alert and oriented to person, place, and time.        CMP Latest Ref Rng & Units 03/07/2020  Glucose 70 - 99 mg/dL 120(H)  BUN 6 - 20 mg/dL 21(H)  Creatinine 0.61 - 1.24 mg/dL 6.90(H)  Sodium 135 - 145 mmol/L 139  Potassium 3.5 - 5.1 mmol/L 3.4(L)  Chloride 98 - 111 mmol/L 97(L)  CO2 22 - 32 mmol/L -  Calcium 8.9 - 10.3 mg/dL -  Total Protein 6.5 - 8.1 g/dL -  Total Bilirubin 0.3 - 1.2 mg/dL -  Alkaline Phos 38 - 126 U/L -  AST 15 - 41 U/L -  ALT 0 - 44 U/L -   CBC Latest Ref Rng & Units 03/07/2020  WBC 4.0 - 10.5 K/uL -  Hemoglobin 13.0 - 17.0 g/dL 8.2(L)  Hematocrit 39.0 - 52.0 % 24.0(L)  Platelets 150 - 400 K/uL -    Assessment and plan- Patient is a 45 y.o. male with history of nonmetastatic prostate cancer treated with I-125 seed implant in the past now with local recurrence in the prostate bed and rising PSA ER for further management  Based on his recent PET scan there was a focus of radiotracer uptake in the prostatic bed posterior to his prior I-125 seed placement.  There was no other evidence of lymph node, visceral or skeletal metastases.  PSA doubling time presently is about 11.5 months.  ADT is certainly reasonable consideration at this time.  In the absence of distant metastatic disease or castrate resistant setting and with the prostate doubling time of greater than 10 months I will hold off on additional oral antiandrogen therapy at this time.  There would also be a possible role for local radiation To the area that was abnormal on the PET scan I will await Dr. Cherrie Gauze return from vacation early next week and coordinate his care with both Dr. Erlene Quan and Dr. Donella Stade.  I will be checking his PSA today.  I will call the patient after my discussion with them regarding his further management   Thank you for this kind referral and the opportunity to participate in the care of this  patient   Visit Diagnosis 1. Prostate cancer ([place])   2. Goals of care, counseling/discussion     Dr. Randa Evens, MD, MPH [facility] [phone] 11/06/2020 1:37 PM

## 2020-11-18 ENCOUNTER — Other Ambulatory Visit: Payer: Self-pay | Admitting: Internal Medicine

## 2020-11-18 NOTE — Telephone Encounter (Signed)
Requested medication (s) are due for refill today: yes  Requested medication (s) are on the active medication list:  yes  Last refill:  08/22/2020  Future visit scheduled: no  Notes to clinic:  this refill cannot be delegated    Requested Prescriptions  Pending Prescriptions Disp Refills   calcitRIOL (ROCALTROL) 0.25 MCG capsule [Pharmacy Med Name: CALCITRIOL 0.25MCG CAPSULES] 90 capsule 1    Sig: TAKE 1 CAPSULE(0.25 MCG) BY MOUTH DAILY      Endocrinology:  Vitamins - Vitamin D Supplementation Failed - 11/18/2020  9:53 AM      Failed - 50,000 IU strengths are not delegated      Failed - Phosphate in normal range and within 360 days    Phosphorus  Date Value Ref Range Status  01/01/2020 6.1 (H) 2.5 - 4.6 mg/dL Final          Failed - Vitamin D in normal range and within 360 days    No results found for: OM6004HT9, [id number], [id number], 25OHVITD3, 25OHVITD2, 25OHVITD3, 25OHVITD2, 25OHVITD1, 25OHVITD2, 25OHVITD3, VD25OH        Passed - Ca in normal range and within 360 days    Calcium  Date Value Ref Range Status  03/06/2020 9.3 8.9 - 10.3 mg/dL Final   Calcium, Total  Date Value Ref Range Status  02/10/2012 8.8 8.5 - 10.1 mg/dL Final   Calcium, Ion  Date Value Ref Range Status  03/07/2020 1.21 1.15 - 1.40 mmol/L Final          Passed - Valid encounter within last 12 months    Recent Outpatient Visits           5 months ago Type 2 diabetes mellitus with chronic kidney disease on chronic dialysis, without long-term current use of insulin [facility])   [facility] Glean Hess, MD   10 months ago Type II diabetes mellitus with complication [facility])   [facility] Glean Hess, MD   11 months ago Essential (primary) hypertension   [facility] Glean Hess, MD   1 year ago Annual physical exam   [facility] Glean Hess, MD   1 year ago Localized edema   [facility] Glean Hess, MD

## 2020-11-19 ENCOUNTER — Telehealth: Payer: Self-pay

## 2020-11-19 ENCOUNTER — Encounter: Payer: Self-pay | Admitting: Urology

## 2020-11-19 ENCOUNTER — Ambulatory Visit (INDEPENDENT_AMBULATORY_CARE_PROVIDER_SITE_OTHER): Payer: 59 | Admitting: Urology

## 2020-11-19 ENCOUNTER — Other Ambulatory Visit: Payer: Self-pay

## 2020-11-19 ENCOUNTER — Ambulatory Visit
Admission: RE | Admit: 2020-11-19 | Discharge: 2020-11-19 | Disposition: A | Discharge status: Home / Self Care | Payer: 59 | Source: Ambulatory Visit | Attending: Radiation Oncology | Admitting: Radiation Oncology

## 2020-11-19 VITALS — BP 134/83 | HR 98 | Ht 71.0 in | Wt 268.0 lb

## 2020-11-19 DIAGNOSIS — R310 Gross hematuria: Secondary | ICD-10-CM | POA: Diagnosis not present

## 2020-11-19 DIAGNOSIS — R9721 Rising PSA following treatment for malignant neoplasm of prostate: Secondary | ICD-10-CM

## 2020-11-19 DIAGNOSIS — C61 Malignant neoplasm of prostate: Secondary | ICD-10-CM | POA: Insufficient documentation

## 2020-11-19 LAB — MICROSCOPIC EXAMINATION: RBC, Urine: >30 /hpf — AB (ref 0–2)

## 2020-11-19 LAB — URINALYSIS, COMPLETE
Bilirubin, UA: NEGATIVE
Nitrite, UA: NEGATIVE
Specific Gravity, UA: 1.02 (ref 1.005–1.030)
Urobilinogen, Ur: 0.2 mg/dL (ref 0.2–1.0)
pH, UA: 5.5 (ref 5.0–7.5)

## 2020-11-19 MED ORDER — GADOBUTROL 1 MMOL/ML IV SOLN
10.0000 mL | Freq: Once | INTRAVENOUS | Status: AC | PRN
  Administered 2020-11-19: 10 mL via INTRAVENOUS

## 2020-11-19 NOTE — Telephone Encounter (Signed)
Left voice mail to have patient call back to set up appointment for his Blood pressure.

## 2020-11-19 NOTE — Progress Notes (Signed)
   11/19/20  CC:  Chief Complaint  Patient presents with  . Cysto    HPI: 45 year old male with history of end-stage renal disease on hemodialysis with recurrent gross hematuria.  He is also being worked up for likely focal prostate cancer recurrence, consult at [facility] with Dr. Alford Highland pending.  Blood pressure 134/83, pulse 98, height 5\' 11"  (1.803 m), weight 268 lb (121.6 kg). NED. A&Ox3.   No respiratory distress   Abd soft, NT, ND Normal phallus with bilateral descended testicles  Cystoscopy Procedure Note  Patient identification was confirmed, informed consent was obtained, and patient was prepped using Betadine solution.  Lidocaine jelly was administered per urethral meatus.     Pre-Procedure: - Inspection reveals a normal caliber ureteral meatus.  Procedure: The flexible cystoscope was introduced without difficulty - No urethral strictures/lesions are present. - Normal prostate  - Normal bladder neck - Bilateral ureteral orifices identified - Bladder mucosa  reveals no ulcers, tumors, or lesions, small amount of bladder debris but otherwise unremarkable - No bladder stones - No trabeculation  Retroflexion shows unremarkable   Post-Procedure: - Patient tolerated the procedure well  Assessment/ Plan:  1. Gross hematuria Cystoscopy today is reassuringly unremarkable, etiology of gross hematuria unclear  Recent upper tract imaging in the form of renal ultrasound 2021 unremarkable other than for evidence of medical renal disease, recent MRI of the pelvis also unremarkable for lower GU pathology outside of #2  ?  If related to urinary stasis - Urinalysis, Complete  2. Rising PSA following treatment for malignant neoplasm of prostate Probable focal prostate cancer recurrence, consult pending at [facility] for consideration of focal salvage treatment  Advised patient to contact us once he has been seen at [facility] to arrange for appropriate follow-up   Hollice Espy,  MD

## 2020-11-20 ENCOUNTER — Encounter: Payer: Self-pay | Admitting: Internal Medicine

## 2020-11-20 ENCOUNTER — Ambulatory Visit (INDEPENDENT_AMBULATORY_CARE_PROVIDER_SITE_OTHER): Payer: 59 | Admitting: Internal Medicine

## 2020-11-20 VITALS — BP 108/72 | HR 93 | Temp 98.8°F | Ht 71.0 in | Wt 260.0 lb

## 2020-11-20 DIAGNOSIS — E1169 Type 2 diabetes mellitus with other specified complication: Secondary | ICD-10-CM | POA: Diagnosis not present

## 2020-11-20 DIAGNOSIS — Z992 Dependence on renal dialysis: Secondary | ICD-10-CM

## 2020-11-20 DIAGNOSIS — I10 Essential (primary) hypertension: Secondary | ICD-10-CM

## 2020-11-20 DIAGNOSIS — E1122 Type 2 diabetes mellitus with diabetic chronic kidney disease: Secondary | ICD-10-CM

## 2020-11-20 DIAGNOSIS — N186 End stage renal disease: Secondary | ICD-10-CM

## 2020-11-20 DIAGNOSIS — N2581 Secondary hyperparathyroidism of renal origin: Secondary | ICD-10-CM

## 2020-11-20 DIAGNOSIS — Z6836 Body mass index (BMI) 36.0-36.9, adult: Secondary | ICD-10-CM | POA: Diagnosis not present

## 2020-11-20 DIAGNOSIS — E785 Hyperlipidemia, unspecified: Secondary | ICD-10-CM

## 2020-11-20 MED ORDER — ATORVASTATIN CALCIUM 10 MG PO TABS: 10.0000 mg | ORAL_TABLET | Freq: Every day | ORAL | 3 refills | Status: DC

## 2020-11-20 MED ORDER — AMLODIPINE BESYLATE 10 MG PO TABS: 1.0000 | ORAL_TABLET | Freq: Every day | ORAL | 3 refills | Status: DC

## 2020-11-20 NOTE — Progress Notes (Signed)
Date:  11/20/2020   Name:  Benjamin Stewart   DOB:  January 20, 1976   MRN:  [id number]   Chief Complaint: Follow-up, Hypertension (Taking amlodipine 10 mg daily and carvedilol 6.25 mg twice daily), and Diabetes (Last HA1C 5.9 on 09/05/20)  Hypertension This is a chronic problem. The problem is unchanged. The problem is controlled. Pertinent negatives include no chest pain, headaches, palpitations or shortness of breath. Past treatments include beta blockers and calcium channel blockers. The current treatment provides significant improvement. There are no compliance problems.  Hypertensive end-organ damage includes kidney disease.  Diabetes He presents for his follow-up diabetic visit. He has type 2 diabetes mellitus. His disease course has been stable. Pertinent negatives for hypoglycemia include no headaches, nervousness/anxiousness or tremors. Pertinent negatives for diabetes include no chest pain and no fatigue. Symptoms are stable. Current diabetic treatment includes diet. He is compliant with treatment most of the time. His weight is stable. An ACE inhibitor/angiotensin II receptor blocker is contraindicated.  Prostate cancer - his PSA is higher.  He is seeing a specialist at [facility] next month to see if there is anything to do to "cure" him.  He is on the transplant list and will be rejected if he has active cancer.  Lab Results  Component Value Date   CREATININE 6.90 (H) 03/07/2020   BUN 21 (H) 03/07/2020   NA 139 03/07/2020   K 3.4 (L) 03/07/2020   CL 97 (L) 03/07/2020   CO2 25 03/06/2020   Lab Results  Component Value Date   CHOL 101 08/21/2019   HDL 25 (L) 08/21/2019   LDLCALC 59 08/21/2019   TRIG 86 08/21/2019   CHOLHDL 4.0 08/21/2019   Lab Results  Component Value Date   TSH 1.173 11/09/2019   Lab Results  Component Value Date   HGBA1C 5.9 09/05/2020   Lab Results  Component Value Date   WBC 7.9 03/06/2020   HGB 8.2 (L) 03/07/2020   HCT 24.0 (L) 03/07/2020   MCV 90.4  03/06/2020   PLT 331 03/06/2020   Lab Results  Component Value Date   ALT 17 11/09/2019   AST 32 11/09/2019   ALKPHOS 37 (L) 11/09/2019   BILITOT 0.6 11/09/2019     Review of Systems  Constitutional: Negative for appetite change, fatigue and unexpected weight change.  Eyes: Negative for visual disturbance.  Respiratory: Negative for cough, shortness of breath and wheezing.   Cardiovascular: Negative for chest pain, palpitations and leg swelling.  Gastrointestinal: Negative for abdominal pain and blood in stool.  Skin: Negative for color change and rash.  Neurological: Negative for tremors, numbness and headaches.  Psychiatric/Behavioral: Negative for dysphoric mood. The patient is not nervous/anxious.     Patient Active Problem List   Diagnosis Date Noted  . End stage renal disease ([place]) 04/11/2020  . Anemia 12/29/2019  . Postural dizziness with presyncope 11/09/2019  . Secondary hyperparathyroidism of renal origin ([place]) 04/24/2019  . Hyperkalemia 04/24/2019  . Localized edema 01/19/2019  . Chronic gout of multiple sites 08/16/2018  . Prostate cancer ([place]) 08/23/2015  . Hyperlipidemia, mixed 07/16/2015  . Essential (primary) hypertension 12/04/2014  . BMI 35.0-35.9,adult 12/04/2014  . Malignant essential hypertension 12/04/2014    Allergies  Allergen Reactions  . Glimepiride Other (See Comments)    Severe hypoglycemia  . Other   . Percocet [Oxycodone-Acetaminophen] Nausea And Vomiting    Past Surgical History:  Procedure Laterality Date  . A/V FISTULAGRAM Left 05/30/2020   Procedure: A/V FISTULAGRAM;  Surgeon: Algernon Huxley, MD;  Location: [place] [facility];  Service: Cardiovascular;  Laterality: Left;  . AV FISTULA PLACEMENT Left 03/07/2020   Procedure: ARTERIOVENOUS (AV) FISTULA CREATION BRACHIAL CEPHALIC;  Surgeon: Algernon Huxley, MD;  Location: [facility] ORS;  Service: Vascular;  Laterality: Left;  . DIALYSIS/PERMA CATHETER INSERTION N/A 01/17/2020   Procedure:  DIALYSIS/PERMA CATHETER INSERTION;  Surgeon: Algernon Huxley, MD;  Location: [place] [facility];  Service: Cardiovascular;  Laterality: N/A;  . DIALYSIS/PERMA CATHETER REMOVAL N/A 10/07/2020   Procedure: DIALYSIS/PERMA CATHETER REMOVAL;  Surgeon: Algernon Huxley, MD;  Location: [place] [facility];  Service: Cardiovascular;  Laterality: N/A;  . PARTIAL COLECTOMY  2005  . RADIOACTIVE SEED IMPLANT N/A 12/06/2017   Procedure: RADIOACTIVE SEED IMPLANT/BRACHYTHERAPY IMPLANT;  Surgeon: Hollice Espy, MD;  Location: [facility] ORS;  Service: Urology;  Laterality: N/A;  . UPPER EXTREMITY ANGIOGRAPHY Left 04/18/2020   Procedure: UPPER EXTREMITY ANGIOGRAPHY;  Surgeon: Algernon Huxley, MD;  Location: [place] [facility];  Service: Cardiovascular;  Laterality: Left;    Social History   Tobacco Use  . Smoking status: Never Smoker  . Smokeless tobacco: Never Used  Vaping Use  . Vaping Use: Never used  Substance Use Topics  . Alcohol use: No    Alcohol/week: 0.0 standard drinks  . Drug use: No     Medication list has been reviewed and updated.  Current Meds  Medication Sig  . amLODipine (NORVASC) 10 MG tablet TAKE 1 TABLET BY MOUTH EVERY DAY  . aspirin EC 81 MG tablet Take 1 tablet (81 mg total) by mouth daily.  Marland Kitchen atorvastatin (LIPITOR) 10 MG tablet TAKE 1 TABLET BY MOUTH EVERY DAY WITH BREAKFAST  . calcitRIOL (ROCALTROL) 0.25 MCG capsule TAKE 1 CAPSULE(0.25 MCG) BY MOUTH DAILY  . carvedilol (COREG) 6.25 MG tablet Take 6.25 mg by mouth 2 (two) times daily with a meal.   . clopidogrel (PLAVIX) 75 MG tablet Take 1 tablet (75 mg total) by mouth daily.  . sevelamer carbonate (RENVELA) 800 MG tablet Take 1 tablet (800 mg total) by mouth 2 (two) times daily with a meal.    PHQ 2/9 Scores 11/20/2020 05/23/2020 01/09/2020 11/27/2019  PHQ - 2 Score 0 0 0 0  PHQ- 9 Score 0 0 0 2    GAD 7 : Generalized Anxiety Score 11/20/2020 05/23/2020 01/09/2020 11/27/2019  Nervous, Anxious, on Edge 0 0 0 0  Control/stop worrying 0  0 0 0  Worry too much - different things 0 0 0 0  Trouble relaxing 0 0 0 0  Restless 0 0 0 0  Easily annoyed or irritable 0 0 0 0  Afraid - awful might happen 0 0 0 0  Total GAD 7 Score 0 0 0 0  Anxiety Difficulty - Not difficult at all Not difficult at all Not difficult at all    BP Readings from Last 3 Encounters:  11/20/20 108/72  11/19/20 134/83  11/05/20 135/90    Physical Exam Vitals and nursing note reviewed.  Constitutional:      General: He is not in acute distress.    Appearance: He is well-developed.  HENT:     Head: Normocephalic and atraumatic.  Cardiovascular:     Rate and Rhythm: Normal rate and regular rhythm.  No extrasystoles are present.    Pulses: Normal pulses.     Heart sounds: Normal heart sounds. No murmur heard.     Arteriovenous access: left arteriovenous access is present. Pulmonary:  Effort: Pulmonary effort is normal. No respiratory distress.  Musculoskeletal:     Right lower leg: No edema.     Left lower leg: No edema.  Skin:    General: Skin is warm and dry.     Findings: No rash.  Neurological:     Mental Status: He is alert and oriented to person, place, and time.  Psychiatric:        Mood and Affect: Mood normal.        Behavior: Behavior normal.     Wt Readings from Last 3 Encounters:  11/20/20 260 lb (117.9 kg)  11/19/20 268 lb (121.6 kg)  11/05/20 271 lb 6.4 oz (123.1 kg)    BP 108/72 (BP Location: Right Arm, Patient Position: Sitting, Cuff Size: Large)   Pulse 93   Temp 98.8 F (37.1 C) (Oral)   Ht 5\' 11"  (1.803 m)   Wt 260 lb (117.9 kg)   SpO2 97%   BMI 36.26 kg/m   Assessment and Plan: 1. Essential (primary) hypertension Clinically stable exam with well controlled BP. Tolerating medications without side effects at this time. Pt to continue current regimen and low sodium diet; benefits of regular exercise as able discussed. - amLODipine (NORVASC) 10 MG tablet; Take 1 tablet (10 mg total) by mouth daily.   Dispense: 90 tablet; Refill: 3  2. Type 2 diabetes mellitus with chronic kidney disease on chronic dialysis, without long-term current use of insulin (HCC) BS controlled without medications at this time Continue labs through HD On Trans[facility] list [facility]  3. Hyperlipidemia associated with type 2 diabetes mellitus ([place]) Tolerating statin therapy well - atorvastatin (LIPITOR) 10 MG tablet; Take 1 tablet (10 mg total) by mouth daily.  Dispense: 90 tablet; Refill: 3  4. BMI 36.0-36.9,adult  5. Secondary hyperparathyroidism of renal origin [facility]) Followed by Nephrology On Rocaltrol and Renvela   Partially dictated using Dragon software. Any errors are unintentional.  Halina Maidens, MD [facility]  11/20/2020

## 2020-12-19 ENCOUNTER — Encounter: Payer: Self-pay | Admitting: Urology

## 2020-12-19 NOTE — Telephone Encounter (Signed)
Spoke with patient ongoing hematuria started last night with some discomfort. Denies difficulty voiding or any other symptoms. Offered same day appointment-declined due to dialysis. Scheduled appointment for tomorrow. Aware if symptoms worsen to seek medical attention.

## 2020-12-20 ENCOUNTER — Encounter: Payer: Self-pay | Admitting: Physician Assistant

## 2020-12-20 ENCOUNTER — Ambulatory Visit (INDEPENDENT_AMBULATORY_CARE_PROVIDER_SITE_OTHER): Payer: 59 | Admitting: Physician Assistant

## 2020-12-20 ENCOUNTER — Other Ambulatory Visit: Payer: Self-pay

## 2020-12-20 VITALS — BP 129/83 | HR 102 | Ht 72.0 in | Wt 265.0 lb

## 2020-12-20 DIAGNOSIS — R310 Gross hematuria: Secondary | ICD-10-CM | POA: Diagnosis not present

## 2020-12-20 LAB — BLADDER SCAN AMB NON-IMAGING: Scan Result: 0

## 2020-12-20 NOTE — Progress Notes (Signed)
12/20/2020 4:48 PM   Benjamin Stewart 11-25-1975 [id number]  CC: Chief Complaint  Patient presents with  . Hematuria    HPI: Benjamin Stewart is a 45 y.o. 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  Today he reports return of gross hematuria 2 days ago after completing hemodialysis.  He states it got progressively worse and was accompanied by continuous pain in the area of the prostate yesterday.  His bleeding has significantly improved today and pain has resolved.  He describes his urine today as dark but without gross hematuria.  Unfortunately, he is unable to provide a urine sample today.  PVR 0 mL.  Notably, he saw Dr. Alford Highland at [facility] last week, who recommended cryotherapy for focal prostate cancer recurrence.  However, Dr. Alford Highland recommended that he be seen by radiation oncology for a second opinion with his colleague before proceeding with treatment.  PMH: Past Medical History:  Diagnosis Date  . Anemia   . Benign essential hematuria 12/04/2014   microscopic hematuria evaluation with CT and cysto - treated for 2 months with bactrim DS; no further f/u planned unless hematuria recurs.   . Chronic kidney disease   . Controlled type 2 diabetes mellitus without complication, without long-term current use of insulin ([place]) 07/16/2015  . COVID-19 virus infection 12/29/2019  . Essential (primary) hypertension 12/04/2014  . History of kidney stones    H/O  . prostate cancer    PROSTATE   . Sleep apnea    DOES NOT USE CPAP    Surgical History: Past Surgical History:  Procedure Laterality Date  . A/V FISTULAGRAM Left 05/30/2020   Procedure: A/V FISTULAGRAM;  Surgeon: Algernon Huxley, MD;  Location: [place] [facility];  Service: Cardiovascular;  Laterality: Left;  . AV FISTULA PLACEMENT Left 03/07/2020   Procedure: ARTERIOVENOUS (AV) FISTULA CREATION  BRACHIAL CEPHALIC;  Surgeon: Algernon Huxley, MD;  Location: [facility] ORS;  Service: Vascular;  Laterality: Left;  . DIALYSIS/PERMA CATHETER INSERTION N/A 01/17/2020   Procedure: DIALYSIS/PERMA CATHETER INSERTION;  Surgeon: Algernon Huxley, MD;  Location: [place] [facility];  Service: Cardiovascular;  Laterality: N/A;  . DIALYSIS/PERMA CATHETER REMOVAL N/A 10/07/2020   Procedure: DIALYSIS/PERMA CATHETER REMOVAL;  Surgeon: Algernon Huxley, MD;  Location: [place] [facility];  Service: Cardiovascular;  Laterality: N/A;  . PARTIAL COLECTOMY  2005  . RADIOACTIVE SEED IMPLANT N/A 12/06/2017   Procedure: RADIOACTIVE SEED IMPLANT/BRACHYTHERAPY IMPLANT;  Surgeon: Hollice Espy, MD;  Location: [facility] ORS;  Service: Urology;  Laterality: N/A;  . UPPER EXTREMITY ANGIOGRAPHY Left 04/18/2020   Procedure: UPPER EXTREMITY ANGIOGRAPHY;  Surgeon: Algernon Huxley, MD;  Location: [place] [facility];  Service: Cardiovascular;  Laterality: Left;    Home Medications:  Allergies as of 12/20/2020      Reactions   Glimepiride Other (See Comments)   Severe hypoglycemia   Other    Percocet [oxycodone-acetaminophen] Nausea And Vomiting      Medication List       Accurate as of Dec 20, 2020  4:48 PM. If you have any questions, ask your nurse or doctor.        amLODipine 10 MG tablet Commonly known as: NORVASC Take 1 tablet (10 mg total) by mouth daily.   aspirin EC 81 MG tablet Take 1 tablet (81 mg total) by mouth daily.   atorvastatin 10 MG tablet Commonly known as:  LIPITOR Take 1 tablet (10 mg total) by mouth daily.   calcitRIOL 0.25 MCG capsule Commonly known as: ROCALTROL TAKE 1 CAPSULE(0.25 MCG) BY MOUTH DAILY   carvedilol 6.25 MG tablet Commonly known as: COREG Take 6.25 mg by mouth 2 (two) times daily with a meal.   clopidogrel 75 MG tablet Commonly known as: Plavix Take 1 tablet (75 mg total) by mouth daily.   sevelamer carbonate 800 MG tablet Commonly known as: RENVELA Take 1 tablet (800 mg total) by  mouth 2 (two) times daily with a meal.       Allergies:  Allergies  Allergen Reactions  . Glimepiride Other (See Comments)    Severe hypoglycemia  . Other   . Percocet [Oxycodone-Acetaminophen] Nausea And Vomiting    Family History: Family History  Problem Relation Age of Onset  . Diabetes Mother   . Heart disease Mother   . Hypertension Mother   . Glaucoma Mother   . Prostate cancer Father   . Diabetes Maternal Grandmother   . Diabetes Maternal Grandfather   . Prostate cancer Maternal Uncle     Social History:   reports that he has never smoked. He has never used smokeless tobacco. He reports that he does not drink alcohol and does not use drugs.  Physical Exam: BP 129/83   Pulse (!) 102   Ht 6' (1.829 m)   Wt 265 lb (120.2 kg)   BMI 35.94 kg/m   Constitutional:  Alert and oriented, no acute distress, nontoxic appearing HEENT: [place], AT Cardiovascular: No clubbing, cyanosis, or edema Respiratory: Normal respiratory effort, no increased work of breathing Skin: No rashes, bruises or suspicious lesions Neurologic: Grossly intact, no focal deficits, moving all 4 extremities Psychiatric: Normal mood and affect  Laboratory Data: Results for orders placed or performed in visit on 12/20/20  Bladder Scan (Post Void Residual) in office  Result Value Ref Range   Scan Result 0    Assessment & Plan:   1. Gross hematuria 2 days of gross hematuria and continuous pain at the bladder base, now improving to resolved without intervention.  Possibly associated with underlying focal prostate cancer recurrence.  I offered the patient a sterile urine cup today and requested that he return a sample next week for testing.  We will treat as indicated.  He expressed understanding. - Bladder Scan (Post Void Residual) in office - Urinalysis, Complete; Future - CULTURE, URINE COMPREHENSIVE; Future  Return for UA drop-off when available.  Debroah Loop, PA-C  [facility] [address], [place], [place] [address] [phone]

## 2020-12-23 ENCOUNTER — Other Ambulatory Visit: Payer: Medicare Other

## 2020-12-23 ENCOUNTER — Other Ambulatory Visit: Payer: Self-pay

## 2020-12-23 DIAGNOSIS — R310 Gross hematuria: Secondary | ICD-10-CM

## 2020-12-24 ENCOUNTER — Other Ambulatory Visit (INDEPENDENT_AMBULATORY_CARE_PROVIDER_SITE_OTHER): Payer: Self-pay | Admitting: Nurse Practitioner

## 2020-12-24 DIAGNOSIS — N186 End stage renal disease: Secondary | ICD-10-CM

## 2020-12-26 ENCOUNTER — Ambulatory Visit (INDEPENDENT_AMBULATORY_CARE_PROVIDER_SITE_OTHER): Payer: 59

## 2020-12-26 ENCOUNTER — Encounter (INDEPENDENT_AMBULATORY_CARE_PROVIDER_SITE_OTHER): Payer: Self-pay | Admitting: Nurse Practitioner

## 2020-12-26 ENCOUNTER — Ambulatory Visit (INDEPENDENT_AMBULATORY_CARE_PROVIDER_SITE_OTHER): Payer: 59 | Admitting: Nurse Practitioner

## 2020-12-26 ENCOUNTER — Other Ambulatory Visit: Payer: Self-pay

## 2020-12-26 VITALS — BP 124/79 | HR 78 | Ht 72.0 in | Wt 264.0 lb

## 2020-12-26 DIAGNOSIS — I10 Essential (primary) hypertension: Secondary | ICD-10-CM | POA: Diagnosis not present

## 2020-12-26 DIAGNOSIS — N186 End stage renal disease: Secondary | ICD-10-CM | POA: Diagnosis not present

## 2020-12-26 DIAGNOSIS — E782 Mixed hyperlipidemia: Secondary | ICD-10-CM

## 2020-12-26 LAB — URINALYSIS, COMPLETE
Bilirubin, UA: NEGATIVE
Glucose, UA: NEGATIVE
Ketones, UA: NEGATIVE
Nitrite, UA: NEGATIVE
Specific Gravity, UA: 1.015 (ref 1.005–1.030)
Urobilinogen, Ur: 0.2 mg/dL (ref 0.2–1.0)
pH, UA: 5.5 (ref 5.0–7.5)

## 2020-12-26 LAB — CULTURE, URINE COMPREHENSIVE

## 2020-12-26 LAB — MICROSCOPIC EXAMINATION

## 2020-12-26 NOTE — Progress Notes (Signed)
Subjective:    Patient ID: Benjamin Stewart, male    DOB: 1976-02-03, 45 y.o.   MRN: [id number] Chief Complaint  Patient presents with  . Follow-up    6 mo  U/S     The patient returns to the office for followup of their dialysis access. The function of the access has been stable. The patient denies increased bleeding time or increased recirculation. Patient denies difficulty with cannulation. The patient denies hand pain or other symptoms consistent with steal phenomena.  No significant arm swelling.  The patient denies redness or swelling at the access site. The patient denies fever or chills at home or while on dialysis.  The patient denies amaurosis fugax or recent TIA symptoms. There are no recent neurological changes noted. The patient denies claudication symptoms or rest pain symptoms. The patient denies history of DVT, PE or superficial thrombophlebitis. The patient denies recent episodes of angina or shortness of breath.     The patient has a flow volume of 3251.  No areas of significant stenosis however the patient's fistula is very tortuous.   Review of Systems  Hematological: Does not bruise/bleed easily.  All other systems reviewed and are negative.      Objective:   Physical Exam Vitals reviewed.  HENT:     Head: Normocephalic.  Cardiovascular:     Rate and Rhythm: Normal rate.     Pulses: Normal pulses.          Radial pulses are 2+ on the left side.     Arteriovenous access: left arteriovenous access is present.    Comments:  Good thrill and bruit in left brachial cephalic AV fistula, somewhat higher pitched near the tortuous areas Pulmonary:     Effort: Pulmonary effort is normal.  Skin:    General: Skin is warm and dry.  Neurological:     Mental Status: He is alert and oriented to person, place, and time.  Psychiatric:        Mood and Affect: Mood normal.        Behavior: Behavior normal.        Thought Content: Thought content normal.         Judgment: Judgment normal.     BP 124/79   Pulse 78   Ht 6' (1.829 m)   Wt 264 lb (119.7 kg)   BMI 35.80 kg/m   Past Medical History:  Diagnosis Date  . Anemia   . Benign essential hematuria 12/04/2014   microscopic hematuria evaluation with CT and cysto - treated for 2 months with bactrim DS; no further f/u planned unless hematuria recurs.   . Chronic kidney disease   . Controlled type 2 diabetes mellitus without complication, without long-term current use of insulin ([place]) 07/16/2015  . COVID-19 virus infection 12/29/2019  . Essential (primary) hypertension 12/04/2014  . History of kidney stones    H/O  . prostate cancer    PROSTATE   . Sleep apnea    DOES NOT USE CPAP    Social History   Socioeconomic History  . Marital status: Divorced    Spouse name: Not on file  . Number of children: Not on file  . Years of education: Not on file  . Highest education level: Not on file  Occupational History  . Not on file  Tobacco Use  . Smoking status: Never Smoker  . Smokeless tobacco: Never Used  Vaping Use  . Vaping Use: Never used  Substance and Sexual Activity  .  Alcohol use: No    Alcohol/week: 0.0 standard drinks  . Drug use: No  . Sexual activity: Yes  Other Topics Concern  . Not on file  Social History Narrative  . Not on file   Social Determinants of Health   Financial Resource Strain: Not on file  Food Insecurity: Not on file  Transportation Needs: Not on file  Physical Activity: Not on file  Stress: Not on file  Social Connections: Not on file  Intimate Partner Violence: Not on file    Past Surgical History:  Procedure Laterality Date  . A/V FISTULAGRAM Left 05/30/2020   Procedure: A/V FISTULAGRAM;  Surgeon: Algernon Huxley, MD;  Location: [place] [facility];  Service: Cardiovascular;  Laterality: Left;  . AV FISTULA PLACEMENT Left 03/07/2020   Procedure: ARTERIOVENOUS (AV) FISTULA CREATION BRACHIAL CEPHALIC;  Surgeon: Algernon Huxley, MD;  Location: [facility]  ORS;  Service: Vascular;  Laterality: Left;  . DIALYSIS/PERMA CATHETER INSERTION N/A 01/17/2020   Procedure: DIALYSIS/PERMA CATHETER INSERTION;  Surgeon: Algernon Huxley, MD;  Location: [place] [facility];  Service: Cardiovascular;  Laterality: N/A;  . DIALYSIS/PERMA CATHETER REMOVAL N/A 10/07/2020   Procedure: DIALYSIS/PERMA CATHETER REMOVAL;  Surgeon: Algernon Huxley, MD;  Location: [place] [facility];  Service: Cardiovascular;  Laterality: N/A;  . PARTIAL COLECTOMY  2005  . RADIOACTIVE SEED IMPLANT N/A 12/06/2017   Procedure: RADIOACTIVE SEED IMPLANT/BRACHYTHERAPY IMPLANT;  Surgeon: Hollice Espy, MD;  Location: [facility] ORS;  Service: Urology;  Laterality: N/A;  . UPPER EXTREMITY ANGIOGRAPHY Left 04/18/2020   Procedure: UPPER EXTREMITY ANGIOGRAPHY;  Surgeon: Algernon Huxley, MD;  Location: [place] [facility];  Service: Cardiovascular;  Laterality: Left;    Family History  Problem Relation Age of Onset  . Diabetes Mother   . Heart disease Mother   . Hypertension Mother   . Glaucoma Mother   . Prostate cancer Father   . Diabetes Maternal Grandmother   . Diabetes Maternal Grandfather   . Prostate cancer Maternal Uncle     Allergies  Allergen Reactions  . Glimepiride Other (See Comments)    Severe hypoglycemia  . Other   . Percocet [Oxycodone-Acetaminophen] Nausea And Vomiting    CBC Latest Ref Rng & Units 03/07/2020 03/06/2020 12/31/2019  WBC 4.0 - 10.5 K/uL - 7.9 8.0  Hemoglobin 13.0 - 17.0 g/dL 8.2(L) 8.2(L) 8.6(L)  Hematocrit 39.0 - 52.0 % 24.0(L) 26.5(L) 26.0(L)  Platelets 150 - 400 K/uL - 331 326      CMP     Component Value Date/Time   NA 139 03/07/2020 1056   NA 142 11/27/2019 1526   NA 143 02/10/2012 1850   K 3.4 (L) 03/07/2020 1056   K 3.8 02/10/2012 1850   CL 97 (L) 03/07/2020 1056   CL 105 02/10/2012 1850   CO2 25 03/06/2020 1001   CO2 30 02/10/2012 1850   GLUCOSE 120 (H) 03/07/2020 1056   GLUCOSE 127 (H) 02/10/2012 1850   BUN 21 (H) 03/07/2020 1056   BUN 56 (H)  11/27/2019 1526   BUN 11 02/10/2012 1850   CREATININE 6.90 (H) 03/07/2020 1056   CREATININE 1.17 02/10/2012 1850   CALCIUM 9.3 03/06/2020 1001   CALCIUM 8.8 02/10/2012 1850   PROT 7.1 11/09/2019 0036   PROT 7.2 08/21/2019 0925   ALBUMIN 2.6 (L) 01/01/2020 0605   ALBUMIN 3.3 (L) 08/21/2019 0925   AST 32 11/09/2019 0036   ALT 17 11/09/2019 0036   ALKPHOS 37 (L) 11/09/2019 0036   BILITOT 0.6  11/09/2019 0036   BILITOT 0.6 08/21/2019 0925   GFRNONAA 7 (L) 03/06/2020 1001   GFRNONAA >60 02/10/2012 1850   GFRAA 9 (L) 03/06/2020 1001   GFRAA >60 02/10/2012 1850     No results found.     Assessment & Plan:   1. ESRD (end stage renal disease) ([place]) Recommend:  The patient is doing well and currently has adequate dialysis access. The patient's dialysis center is not reporting any access issues. Flow pattern is stable when compared to the prior ultrasound.  The patient should have a duplex ultrasound of the dialysis access in 6 months. The patient will follow-up with me in the office after each ultrasound     2. Hyperlipidemia, mixed Continue statin as ordered and reviewed, no changes at this time   3. Essential (primary) hypertension Continue antihypertensive medications as already ordered, these medications have been reviewed and there are no changes at this time.    Current Outpatient Medications on File Prior to Visit  Medication Sig Dispense Refill  . amLODipine (NORVASC) 10 MG tablet Take 1 tablet (10 mg total) by mouth daily. 90 tablet 3  . aspirin EC 81 MG tablet Take 1 tablet (81 mg total) by mouth daily. 150 tablet 2  . atorvastatin (LIPITOR) 10 MG tablet Take 1 tablet (10 mg total) by mouth daily. 90 tablet 3  . calcitRIOL (ROCALTROL) 0.25 MCG capsule TAKE 1 CAPSULE(0.25 MCG) BY MOUTH DAILY 90 capsule 0  . carvedilol (COREG) 6.25 MG tablet Take 6.25 mg by mouth 2 (two) times daily with a meal.     . clopidogrel (PLAVIX) 75 MG tablet Take 1 tablet (75 mg total) by  mouth daily. 30 tablet 11  . sevelamer carbonate (RENVELA) 800 MG tablet Take 1 tablet (800 mg total) by mouth 2 (two) times daily with a meal. 60 tablet 1   No current facility-administered medications on file prior to visit.    There are no Patient Instructions on file for this visit. No follow-ups on file.   Kris Hartmann, NP

## 2021-02-14 ENCOUNTER — Other Ambulatory Visit: Payer: Self-pay | Admitting: Internal Medicine

## 2021-02-14 NOTE — Telephone Encounter (Signed)
Requested medication (s) are due for refill today: yes  Requested medication (s) are on the active medication list: yes  Last refill:  11/19/2020  Future visit scheduled: yes   Notes to clinic: 50,000 IU strengths are not delegated  Requested Prescriptions  Pending Prescriptions Disp Refills   calcitRIOL (ROCALTROL) 0.25 MCG capsule [Pharmacy Med Name: CALCITRIOL 0.25MCG CAPSULES] 90 capsule 0    Sig: TAKE 1 CAPSULE(0.25 MCG) BY MOUTH DAILY      Endocrinology:  Vitamins - Vitamin D Supplementation Failed - 02/14/2021 11:46 AM      Failed - 50,000 IU strengths are not delegated      Failed - Phosphate in normal range and within 360 days    Phosphorus  Date Value Ref Range Status  01/01/2020 6.1 (H) 2.5 - 4.6 mg/dL Final          Failed - Vitamin D in normal range and within 360 days    No results found for: [id number], [id number], [id number], [place], [place], [place], 25OHVITD2, 25OHVITD1, 25OHVITD2, 25OHVITD3, VD25OH        Passed - Ca in normal range and within 360 days    Calcium  Date Value Ref Range Status  03/06/2020 9.3 8.9 - 10.3 mg/dL Final   Calcium, Total  Date Value Ref Range Status  02/10/2012 8.8 8.5 - 10.1 mg/dL Final   Calcium, Ion  Date Value Ref Range Status  03/07/2020 1.21 1.15 - 1.40 mmol/L Final          Passed - Valid encounter within last 12 months    Recent Outpatient Visits           2 months ago Type 2 diabetes mellitus with chronic kidney disease on chronic dialysis, without long-term current use of insulin [facility])   [facility] Glean Hess, MD   8 months ago Type 2 diabetes mellitus with chronic kidney disease on chronic dialysis, without long-term current use of insulin [facility])   [facility] Glean Hess, MD   1 year ago Type II diabetes mellitus with complication [facility])   [facility] Glean Hess, MD   1 year ago Essential (primary) hypertension   [facility] Glean Hess,  MD   1 year ago Annual physical exam   [facility] Glean Hess, MD       Future Appointments             In 3 months Hollice Espy, MD [place]   In 3 months Army Melia, Jesse Sans, MD [facility], [facility]

## 2021-04-25 ENCOUNTER — Other Ambulatory Visit (INDEPENDENT_AMBULATORY_CARE_PROVIDER_SITE_OTHER): Payer: Self-pay | Admitting: Vascular Surgery

## 2021-05-01 ENCOUNTER — Ambulatory Visit: Payer: 59 | Admitting: Radiation Oncology

## 2021-05-03 ENCOUNTER — Other Ambulatory Visit
Admission: RE | Admit: 2021-05-03 | Discharge: 2021-05-03 | Disposition: A | Discharge status: Home / Self Care | Payer: 59 | Source: Other Acute Inpatient Hospital | Attending: Nephrology | Admitting: Nephrology

## 2021-05-03 DIAGNOSIS — E1122 Type 2 diabetes mellitus with diabetic chronic kidney disease: Secondary | ICD-10-CM | POA: Diagnosis present

## 2021-05-03 LAB — BASIC METABOLIC PANEL
Anion gap: 11 (ref 5–15)
BUN: 40 mg/dL — ABNORMAL HIGH (ref 6–20)
CO2: 14 mmol/L — ABNORMAL LOW (ref 22–32)
Calcium: 8.9 mg/dL (ref 8.9–10.3)
Chloride: 104 mmol/L (ref 98–111)
Creatinine, Ser: 7.49 mg/dL — ABNORMAL HIGH (ref 0.61–1.24)
GFR, Estimated: 8 mL/min — ABNORMAL LOW (ref >60)
Glucose, Bld: 177 mg/dL — ABNORMAL HIGH (ref 70–99)
Potassium: 4 mmol/L (ref 3.5–5.1)
Sodium: 129 mmol/L — ABNORMAL LOW (ref 135–145)

## 2021-05-03 LAB — CBC WITH DIFFERENTIAL/PLATELET
Abs Immature Granulocytes: 0.03 10*3/uL (ref 0.00–0.07)
Basophils Absolute: 0.1 10*3/uL (ref 0.0–0.1)
Basophils Relative: 1 %
Eosinophils Absolute: 0.2 10*3/uL (ref 0.0–0.5)
Eosinophils Relative: 3 %
HCT: 38.7 % — ABNORMAL LOW (ref 39.0–52.0)
Hemoglobin: 13.5 g/dL (ref 13.0–17.0)
Immature Granulocytes: 0 %
Lymphocytes Relative: 20 %
Lymphs Abs: 1.5 10*3/uL (ref 0.7–4.0)
MCH: 33.2 pg (ref 26.0–34.0)
MCHC: 34.9 g/dL (ref 30.0–36.0)
MCV: 95.1 fL (ref 80.0–100.0)
Monocytes Absolute: 0.7 10*3/uL (ref 0.1–1.0)
Monocytes Relative: 9 %
Neutro Abs: 5.1 10*3/uL (ref 1.7–7.7)
Neutrophils Relative %: 67 %
Platelets: 284 10*3/uL (ref 150–400)
RBC: 4.07 MIL/uL — ABNORMAL LOW (ref 4.22–5.81)
RDW: 12.7 % (ref 11.5–15.5)
WBC: 7.6 10*3/uL (ref 4.0–10.5)
nRBC: 0 % (ref 0.0–0.2)

## 2021-05-07 ENCOUNTER — Other Ambulatory Visit
Admission: RE | Admit: 2021-05-07 | Discharge: 2021-05-07 | Disposition: A | Discharge status: Home / Self Care | Payer: 59 | Source: Ambulatory Visit | Attending: Nephrology | Admitting: Nephrology

## 2021-05-07 DIAGNOSIS — N186 End stage renal disease: Secondary | ICD-10-CM | POA: Diagnosis not present

## 2021-05-07 LAB — RENAL FUNCTION PANEL
Albumin: 3.8 g/dL (ref 3.5–5.0)
Anion gap: 17 — ABNORMAL HIGH (ref 5–15)
BUN: 76 mg/dL — ABNORMAL HIGH (ref 6–20)
CO2: 20 mmol/L — ABNORMAL LOW (ref 22–32)
Calcium: 9.5 mg/dL (ref 8.9–10.3)
Chloride: 100 mmol/L (ref 98–111)
Creatinine, Ser: 12.22 mg/dL — ABNORMAL HIGH (ref 0.61–1.24)
GFR, Estimated: 5 mL/min — ABNORMAL LOW (ref >60)
Glucose, Bld: 108 mg/dL — ABNORMAL HIGH (ref 70–99)
Phosphorus: 8.5 mg/dL — ABNORMAL HIGH (ref 2.5–4.6)
Potassium: 4.3 mmol/L (ref 3.5–5.1)
Sodium: 137 mmol/L (ref 135–145)

## 2021-05-21 ENCOUNTER — Ambulatory Visit: Payer: 59 | Admitting: Urology

## 2021-05-23 ENCOUNTER — Other Ambulatory Visit: Payer: Self-pay

## 2021-05-23 ENCOUNTER — Encounter: Payer: Self-pay | Admitting: Internal Medicine

## 2021-05-23 ENCOUNTER — Ambulatory Visit (INDEPENDENT_AMBULATORY_CARE_PROVIDER_SITE_OTHER): Payer: 59 | Admitting: Internal Medicine

## 2021-05-23 VITALS — BP 124/78 | HR 97 | Ht 72.0 in | Wt 269.0 lb

## 2021-05-23 DIAGNOSIS — Z6836 Body mass index (BMI) 36.0-36.9, adult: Secondary | ICD-10-CM

## 2021-05-23 DIAGNOSIS — Z992 Dependence on renal dialysis: Secondary | ICD-10-CM | POA: Diagnosis not present

## 2021-05-23 DIAGNOSIS — I10 Essential (primary) hypertension: Secondary | ICD-10-CM | POA: Diagnosis not present

## 2021-05-23 DIAGNOSIS — N186 End stage renal disease: Secondary | ICD-10-CM | POA: Diagnosis not present

## 2021-05-23 DIAGNOSIS — E1122 Type 2 diabetes mellitus with diabetic chronic kidney disease: Secondary | ICD-10-CM

## 2021-05-23 LAB — POCT GLYCOSYLATED HEMOGLOBIN (HGB A1C): Hemoglobin A1C: 5.4 % (ref 4.0–5.6)

## 2021-05-23 NOTE — Progress Notes (Signed)
Date:  05/23/2021   Name:  Benjamin Stewart   DOB:  January 31, 1976   MRN:  [id number]   Chief Complaint: Diabetes and Hypertension  Hypertension This is a chronic problem. The problem is controlled. Pertinent negatives include no chest pain, headaches, palpitations or shortness of breath. There are no associated agents to hypertension. Past treatments include calcium channel blockers and beta blockers. The current treatment provides moderate improvement. Hypertensive end-organ damage includes kidney disease.  Diabetes He presents for his follow-up diabetic visit. He has type 2 diabetes mellitus. His disease course has been improving (now off of all medications - A1C almost normal last check). Pertinent negatives for hypoglycemia include no dizziness, headaches or nervousness/anxiousness. Pertinent negatives for diabetes include no chest pain, no fatigue and no weakness. Diabetic complications include nephropathy. When asked about meal planning, he reported none.  ESRD - changed to [employer] at [employer]. He had an issue here in [place].  Lab Results  Component Value Date   CREATININE 12.22 (H) 05/07/2021   BUN 76 (H) 05/07/2021   NA 137 05/07/2021   K 4.3 05/07/2021   CL 100 05/07/2021   CO2 20 (L) 05/07/2021   Lab Results  Component Value Date   CHOL 101 08/21/2019   HDL 25 (L) 08/21/2019   LDLCALC 59 08/21/2019   TRIG 86 08/21/2019   CHOLHDL 4.0 08/21/2019   Lab Results  Component Value Date   TSH 1.173 11/09/2019   Lab Results  Component Value Date   HGBA1C 5.9 09/05/2020   Lab Results  Component Value Date   WBC 7.6 05/03/2021   HGB 13.5 05/03/2021   HCT 38.7 (L) 05/03/2021   MCV 95.1 05/03/2021   PLT 284 05/03/2021   Lab Results  Component Value Date   ALT 17 11/09/2019   AST 32 11/09/2019   ALKPHOS 37 (L) 11/09/2019   BILITOT 0.6 11/09/2019     Review of Systems  Constitutional:  Negative for fatigue and unexpected weight change.  HENT:  Negative for  nosebleeds.   Eyes:  Negative for visual disturbance.  Respiratory:  Negative for cough, chest tightness, shortness of breath and wheezing.   Cardiovascular:  Negative for chest pain, palpitations and leg swelling.  Gastrointestinal:  Negative for abdominal pain, constipation and diarrhea.  Neurological:  Negative for dizziness, weakness, light-headedness and headaches.  Psychiatric/Behavioral:  Negative for dysphoric mood and sleep disturbance. The patient is not nervous/anxious.    Patient Active Problem List   Diagnosis Date Noted   Morbid (severe) obesity due to excess calories ([place]) 11/20/2020   End stage renal disease ([place]) 04/11/2020   Anemia 12/29/2019   Postural dizziness with presyncope 11/09/2019   Secondary hyperparathyroidism of renal origin ([place]) 04/24/2019   Hyperkalemia 04/24/2019   Localized edema 01/19/2019   Chronic gout of multiple sites 08/16/2018   Prostate cancer ([place]) 08/23/2015   Hyperlipidemia, mixed 07/16/2015   Essential (primary) hypertension 12/04/2014   BMI 35.0-35.9,adult 12/04/2014    Allergies  Allergen Reactions   Glimepiride Other (See Comments)    Severe hypoglycemia   Other    Percocet [Oxycodone-Acetaminophen] Nausea And Vomiting    Past Surgical History:  Procedure Laterality Date   A/V FISTULAGRAM Left 05/30/2020   Procedure: A/V FISTULAGRAM;  Surgeon: Algernon Huxley, MD;  Location: [place] [facility];  Service: Cardiovascular;  Laterality: Left;   AV FISTULA PLACEMENT Left 03/07/2020   Procedure: ARTERIOVENOUS (AV) FISTULA CREATION BRACHIAL CEPHALIC;  Surgeon: Algernon Huxley, MD;  Location:  [facility] OR[facility];  Service: Vascular;  Laterality: Left;   DIALYSIS/PERMA CATHETER INSERTION N/A 01/17/2020   Procedure: DIALYSIS/PERMA CATHETER INSERTION;  Surgeon: Algernon Huxley, MD;  Location: [place] [facility];  Service: Cardiovascular;  Laterality: N/A;   DIALYSIS/PERMA CATHETER REMOVAL N/A 10/07/2020   Procedure: DIALYSIS/PERMA CATHETER REMOVAL;   Surgeon: Algernon Huxley, MD;  Location: [place] [facility];  Service: Cardiovascular;  Laterality: N/A;   PARTIAL COLECTOMY  2005   RADIOACTIVE SEED IMPLANT N/A 12/06/2017   Procedure: RADIOACTIVE SEED IMPLANT/BRACHYTHERAPY IMPLANT;  Surgeon: Hollice Espy, MD;  Location: [facility] OR[facility];  Service: Urology;  Laterality: N/A;   UPPER EXTREMITY ANGIOGRAPHY Left 04/18/2020   Procedure: UPPER EXTREMITY ANGIOGRAPHY;  Surgeon: Algernon Huxley, MD;  Location: [place] [facility];  Service: Cardiovascular;  Laterality: Left;    Social History   Tobacco Use   Smoking status: Never   Smokeless tobacco: Never  Vaping Use   Vaping Use: Never used  Substance Use Topics   Alcohol use: No    Alcohol/week: 0.0 standard drinks   Drug use: No     Medication list has been reviewed and updated.  Current Meds  Medication Sig   amLODipine (NORVASC) 10 MG tablet Take 1 tablet (10 mg total) by mouth daily.   aspirin EC 81 MG tablet Take 1 tablet (81 mg total) by mouth daily.   atorvastatin (LIPITOR) 10 MG tablet Take 1 tablet (10 mg total) by mouth daily.   calcitRIOL (ROCALTROL) 0.25 MCG capsule TAKE 1 CAPSULE(0.25 MCG) BY MOUTH DAILY   carvedilol (COREG) 6.25 MG tablet Take 6.25 mg by mouth 2 (two) times daily with a meal.    clopidogrel (PLAVIX) 75 MG tablet TAKE 1 TABLET(75 MG) BY MOUTH DAILY   sevelamer carbonate (RENVELA) 800 MG tablet Take 1 tablet (800 mg total) by mouth 2 (two) times daily with a meal.    PHQ 2/9 Scores 05/23/2021 11/20/2020 05/23/2020 01/09/2020  PHQ - 2 Score 0 0 0 0  PHQ- 9 Score 0 0 0 0    GAD 7 : Generalized Anxiety Score 05/23/2021 11/20/2020 05/23/2020 01/09/2020  Nervous, Anxious, on Edge 0 0 0 0  Control/stop worrying 0 0 0 0  Worry too much - different things 0 0 0 0  Trouble relaxing 0 0 0 0  Restless 0 0 0 0  Easily annoyed or irritable 0 0 0 0  Afraid - awful might happen 0 0 0 0  Total GAD 7 Score 0 0 0 0  Anxiety Difficulty Not difficult at all - Not difficult at all  Not difficult at all    BP Readings from Last 3 Encounters:  05/23/21 124/78  12/26/20 124/79  12/20/20 129/83    Physical Exam Vitals and nursing note reviewed.  Constitutional:      General: He is not in acute distress.    Appearance: He is well-developed.  HENT:     Head: Normocephalic and atraumatic.  Cardiovascular:     Rate and Rhythm: Normal rate and regular rhythm.     Pulses: Normal pulses.     Heart sounds: No murmur heard. Pulmonary:     Effort: Pulmonary effort is normal. No respiratory distress.     Breath sounds: No wheezing or rhonchi.  Musculoskeletal:     Cervical back: Normal range of motion.     Right lower leg: No edema.     Left lower leg: No edema.  Skin:    General: Skin is warm and dry.  Findings: No rash.  Neurological:     Mental Status: He is alert and oriented to person, place, and time.  Psychiatric:        Mood and Affect: Mood normal.        Behavior: Behavior normal.    Wt Readings from Last 3 Encounters:  05/23/21 269 lb (122 kg)  12/26/20 264 lb (119.7 kg)  12/20/20 265 lb (120.2 kg)    BP 124/78   Pulse 97   Ht 6' (1.829 m)   Wt 269 lb (122 kg)   SpO2 98%   BMI 36.48 kg/m   Assessment and Plan: 1. Essential (primary) hypertension Clinically stable exam with well controlled BP. Tolerating medications without side effects at this time. Pt to continue current regimen and low sodium diet; benefits of regular exercise as able discussed.  2. Type 2 diabetes mellitus with chronic kidney disease on chronic dialysis, without long-term current use of insulin ([place]) Clinically stable by exam and report without s/s of hypoglycemia. DM complicated by hypertension and dyslipidemia. Much improved with weight loss and renal failure. If A1C is less than 5.5 will resolve this diagnosis. POC A1C = 5.4  3. BMI 36.0-36.9,adult Weight is stable.  Continue healthy diet.   Partially dictated using Editor, commissioning. Any errors are  unintentional.  Halina Maidens, MD [facility]  05/23/2021

## 2021-06-23 ENCOUNTER — Other Ambulatory Visit (INDEPENDENT_AMBULATORY_CARE_PROVIDER_SITE_OTHER): Payer: Self-pay | Admitting: Nurse Practitioner

## 2021-06-23 DIAGNOSIS — N186 End stage renal disease: Secondary | ICD-10-CM

## 2021-06-24 ENCOUNTER — Ambulatory Visit (INDEPENDENT_AMBULATORY_CARE_PROVIDER_SITE_OTHER): Payer: 59

## 2021-06-24 ENCOUNTER — Other Ambulatory Visit: Payer: Self-pay

## 2021-06-24 ENCOUNTER — Encounter (INDEPENDENT_AMBULATORY_CARE_PROVIDER_SITE_OTHER): Payer: Self-pay | Admitting: Vascular Surgery

## 2021-06-24 ENCOUNTER — Ambulatory Visit (INDEPENDENT_AMBULATORY_CARE_PROVIDER_SITE_OTHER): Payer: 59 | Admitting: Vascular Surgery

## 2021-06-24 VITALS — BP 149/91 | HR 88 | Resp 16 | Wt 264.0 lb

## 2021-06-24 DIAGNOSIS — N186 End stage renal disease: Secondary | ICD-10-CM

## 2021-06-24 DIAGNOSIS — E782 Mixed hyperlipidemia: Secondary | ICD-10-CM

## 2021-06-24 DIAGNOSIS — C61 Malignant neoplasm of prostate: Secondary | ICD-10-CM

## 2021-06-24 DIAGNOSIS — I10 Essential (primary) hypertension: Secondary | ICD-10-CM | POA: Diagnosis not present

## 2021-06-24 MED ORDER — ASPIRIN EC 81 MG PO TBEC: 81.0000 mg | DELAYED_RELEASE_TABLET | Freq: Every day | ORAL | 2 refills | Status: DC

## 2021-06-24 NOTE — Progress Notes (Signed)
MRN : [id number]  Benjamin Stewart is a 45 y.o. (05/30/1976) male who presents with chief complaint of  Chief Complaint  Patient presents with   Follow-up    Ultrasound follow up  .  History of Present Illness: Patient returns today in follow up of his dialysis access.  He has an AV fistula in the left arm which has been used now for about a year and a half.  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  The fistula itself is widely patent on duplex today and has no focal stenosis identified.  It is working well without any difficulty with the access, prolonged bleeding, or diminished flow.  Current Outpatient Medications  Medication Sig Dispense Refill   amLODipine (NORVASC) 10 MG tablet Take 1 tablet (10 mg total) by mouth daily. 90 tablet 3   aspirin EC 81 MG tablet Take 1 tablet (81 mg total) by mouth daily. 150 tablet 2   atorvastatin (LIPITOR) 10 MG tablet Take 1 tablet (10 mg total) by mouth daily. 90 tablet 3   calcitRIOL (ROCALTROL) 0.25 MCG capsule TAKE 1 CAPSULE(0.25 MCG) BY MOUTH DAILY 90 capsule 0   carvedilol (COREG) 6.25 MG tablet Take 6.25 mg by mouth 2 (two) times daily with a meal.      clopidogrel (PLAVIX) 75 MG tablet TAKE 1 TABLET(75 MG) BY MOUTH DAILY 30 tablet 11   sevelamer carbonate (RENVELA) 800 MG tablet Take 1 tablet (800 mg total) by mouth 2 (two) times daily with a meal. 60 tablet 1   No current facility-administered medications for this visit.    Past Medical History:  Diagnosis Date   Anemia    Benign essential hematuria 12/04/2014   microscopic hematuria evaluation with CT and cysto - treated for 2 months with bactrim DS; no further f/u planned unless hematuria recurs.    Chronic kidney disease    Controlled type 2 diabetes mellitus without complication, without long-term current use of insulin ([place]) 07/16/2015   COVID-19 virus infection 12/29/2019   Essential (primary) hypertension  12/04/2014   History of kidney stones    H/O   Malignant essential hypertension 12/04/2014   prostate cancer    PROSTATE    Sleep apnea    DOES NOT USE CPAP    Past Surgical History:  Procedure Laterality Date   A/V FISTULAGRAM Left 05/30/2020   Procedure: A/V FISTULAGRAM;  Surgeon: Algernon Huxley, MD;  Location: [place] [facility];  Service: Cardiovascular;  Laterality: Left;   AV FISTULA PLACEMENT Left 03/07/2020   Procedure: ARTERIOVENOUS (AV) FISTULA CREATION BRACHIAL CEPHALIC;  Surgeon: Algernon Huxley, MD;  Location: [facility];  Service: Vascular;  Laterality: Left;   DIALYSIS/PERMA CATHETER INSERTION N/A 01/17/2020   Procedure: DIALYSIS/PERMA CATHETER INSERTION;  Surgeon: Algernon Huxley, MD;  Location: [place] [facility];  Service: Cardiovascular;  Laterality: N/A;   DIALYSIS/PERMA CATHETER REMOVAL N/A 10/07/2020   Procedure: DIALYSIS/PERMA CATHETER REMOVAL;  Surgeon: Algernon Huxley, MD;  Location: [place] [facility];  Service: Cardiovascular;  Laterality: N/A;   PARTIAL COLECTOMY  2005   RADIOACTIVE SEED IMPLANT N/A 12/06/2017   Procedure: RADIOACTIVE SEED IMPLANT/BRACHYTHERAPY IMPLANT;  Surgeon: Hollice Espy, MD;  Location: [facility];  Service: Urology;  Laterality: N/A;   UPPER EXTREMITY ANGIOGRAPHY Left 04/18/2020   Procedure: UPPER EXTREMITY ANGIOGRAPHY;  Surgeon: Algernon Huxley, MD;  Location: [place] [facility];  Service: Cardiovascular;  Laterality: Left;  Social History   Tobacco Use   Smoking status: Never   Smokeless tobacco: Never  Vaping Use   Vaping Use: Never used  Substance Use Topics   Alcohol use: No    Alcohol/week: 0.0 standard drinks   Drug use: No       Family History  Problem Relation Age of Onset   Diabetes Mother    Heart disease Mother    Hypertension Mother    Glaucoma Mother    Prostate cancer Father    Diabetes Maternal Grandmother    Diabetes Maternal Grandfather    Prostate cancer Maternal Uncle      Allergies  Allergen Reactions    Glimepiride Other (See Comments)    Severe hypoglycemia   Other    Percocet [Oxycodone-Acetaminophen] Nausea And Vomiting     REVIEW OF SYSTEMS (Negative unless checked)  Constitutional: [] Weight loss  [] Fever  [] Chills Cardiac: [] Chest pain   [] Chest pressure   [] Palpitations   [] Shortness of breath when laying flat   [] Shortness of breath at rest   [] Shortness of breath with exertion. Vascular:  [] Pain in legs with walking   [] Pain in legs at rest   [] Pain in legs when laying flat   [] Claudication   [] Pain in feet when walking  [] Pain in feet at rest  [] Pain in feet when laying flat   [] History of DVT   [] Phlebitis   [] Swelling in legs   [] Varicose veins   [] Non-healing ulcers Pulmonary:   [] Uses home oxygen   [] Productive cough   [] Hemoptysis   [] Wheeze  [] COPD   [] Asthma Neurologic:  [] Dizziness  [] Blackouts   [] Seizures   [] History of stroke   [] History of TIA  [] Aphasia   [] Temporary blindness   [] Dysphagia   [] Weakness or numbness in arms   [] Weakness or numbness in legs Musculoskeletal:  [] Arthritis   [] Joint swelling   [x] Joint pain   [] Low back pain Hematologic:  [] Easy bruising  [] Easy bleeding   [] Hypercoagulable state   [x] Anemic   Gastrointestinal:  [] Blood in stool   [] Vomiting blood  [] Gastroesophageal reflux/heartburn   [] Abdominal pain Genitourinary:  [x] Chronic kidney disease   [] Difficult urination  [x] Frequent urination  [] Burning with urination   [] Hematuria Skin:  [] Rashes   [] Ulcers   [] Wounds Psychological:  [] History of anxiety   []  History of major depression.  Physical Examination  BP (!) 149/91 (BP Location: Right Arm)   Pulse 88   Resp 16   Wt 264 lb (119.7 kg)   BMI 35.80 kg/m  Gen:  WD/WN, NAD.  Appears older than stated age Head: [place]/AT, No temporalis wasting. Ear/Nose/Throat: Hearing grossly intact, nares w/o erythema or drainage Eyes: Conjunctiva clear. Sclera non-icteric Neck: Supple.  Trachea midline Pulmonary:  Good air movement, no use of  accessory muscles.  Cardiac: RRR, no JVD Vascular: Left brachiocephalic AV fistula with excellent thrill.  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  The more distal access site is not aneurysmal.  The access sites are in very close proximity with plenty of the fistula having not been used for dialysis yet. Vessel Right Left  Radial Palpable Palpable   Musculoskeletal: M/S 5/5 throughout.  No deformity or atrophy. No edema. Neurologic: Sensation grossly intact in extremities.  Symmetrical.  Speech is fluent.  Psychiatric: Judgment intact, Mood & affect appropriate for pt's clinical situation. Dermatologic: No rashes or ulcers noted.  No cellulitis or open wounds.      Labs Recent Results (from the past 2160  hour(s))  CBC with Differential/Platelet     Status: Abnormal   Collection Time: 05/03/21 10:00 AM  Result Value Ref Range   WBC 7.6 4.0 - 10.5 K/uL   RBC 4.07 (L) 4.22 - 5.81 MIL/uL   Hemoglobin 13.5 13.0 - 17.0 g/dL   HCT 38.7 (L) 39.0 - 52.0 %   MCV 95.1 80.0 - 100.0 fL   MCH 33.2 26.0 - 34.0 pg   MCHC 34.9 30.0 - 36.0 g/dL   RDW 12.7 11.5 - 15.5 %   Platelets 284 150 - 400 K/uL   nRBC 0.0 0.0 - 0.2 %   Neutrophils Relative % 67 %   Neutro Abs 5.1 1.7 - 7.7 K/uL   Lymphocytes Relative 20 %   Lymphs Abs 1.5 0.7 - 4.0 K/uL   Monocytes Relative 9 %   Monocytes Absolute 0.7 0.1 - 1.0 K/uL   Eosinophils Relative 3 %   Eosinophils Absolute 0.2 0.0 - 0.5 K/uL   Basophils Relative 1 %   Basophils Absolute 0.1 0.0 - 0.1 K/uL   Immature Granulocytes 0 %   Abs Immature Granulocytes 0.03 0.00 - 0.07 K/uL    Comment: Performed at [facility], [address] [place], [place] [address]  Basic metabolic panel     Status: Abnormal   Collection Time: 05/03/21 10:00 AM  Result Value Ref Range   Sodium 129 (L) 135 - 145 mmol/L   Potassium 4.0 3.5 - 5.1 mmol/L   Chloride 104 98 - 111 mmol/L   CO2 14 (L) 22 - 32 mmol/L   Glucose, Bld 177 (H) 70 - 99 mg/dL     Comment: Glucose reference range applies only to samples taken after fasting for at least 8 hours.   BUN 40 (H) 6 - 20 mg/dL   Creatinine, Ser 7.49 (H) 0.61 - 1.24 mg/dL   Calcium 8.9 8.9 - 10.3 mg/dL   GFR, Estimated 8 (L) >60 mL/min    Comment: (NOTE) Calculated using the CKD-EPI Creatinine Equation (2021)    Anion gap 11 5 - 15    Comment: Performed at [facility], [address] [place], [place] [address]  Renal function panel     Status: Abnormal   Collection Time: 05/07/21  5:50 PM  Result Value Ref Range   Sodium 137 135 - 145 mmol/L   Potassium 4.3 3.5 - 5.1 mmol/L   Chloride 100 98 - 111 mmol/L   CO2 20 (L) 22 - 32 mmol/L   Glucose, Bld 108 (H) 70 - 99 mg/dL    Comment: Glucose reference range applies only to samples taken after fasting for at least 8 hours.   BUN 76 (H) 6 - 20 mg/dL   Creatinine, Ser 12.22 (H) 0.61 - 1.24 mg/dL   Calcium 9.5 8.9 - 10.3 mg/dL   Phosphorus 8.5 (H) 2.5 - 4.6 mg/dL   Albumin 3.8 3.5 - 5.0 g/dL   GFR, Estimated 5 (L) >60 mL/min    Comment: (NOTE) Calculated using the CKD-EPI Creatinine Equation (2021)    Anion gap 17 (H) 5 - 15    Comment: Performed at [facility], [address] [place], [place] [address]  POCT glycosylated hemoglobin (Hb A1C)     Status: Abnormal   Collection Time: 05/23/21  2:27 PM  Result Value Ref Range   Hemoglobin A1C 5.4 4.0 - 5.6 %    Radiology No results found.  Assessment/Plan  End stage renal disease (HCC) Duplex today shows a widely  patent left brachiocephalic AV fistula without any obvious stenosis and excellent flow volumes.  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  He would have a much improved durability of this fistula if we would rotate the access sites more.  No intervention of benefit currently.  Repeat duplex in 6 months.  Essential (primary) hypertension An underlying cause of his renal failure and  blood pressure control important in reducing the progression of atherosclerotic disease. On appropriate oral medications.   Prostate cancer [facility]) Has a prostate biopsy next month and can hold his Plavix for 7 days prior to the procedure.  Hyperlipidemia, mixed lipid control important in reducing the progression of atherosclerotic disease. Continue statin therapy    [name], MD  06/24/2021 4:46 PM    This note was created with Dragon medical transcription system.  Any errors from dictation are purely unintentional

## 2021-06-24 NOTE — Assessment & Plan Note (Signed)
Duplex today shows a widely patent left brachiocephalic AV fistula without any obvious stenosis and excellent flow volumes.  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  He would have a much improved durability of this fistula if we would rotate the access sites more.  No intervention of benefit currently.  Repeat duplex in 6 months.

## 2021-06-24 NOTE — Assessment & Plan Note (Signed)
Has a prostate biopsy next month and can hold his Plavix for 7 days prior to the procedure.

## 2021-06-24 NOTE — Assessment & Plan Note (Signed)
lipid control important in reducing the progression of atherosclerotic disease. Continue statin therapy  

## 2021-06-24 NOTE — Assessment & Plan Note (Signed)
An underlying cause of his renal failure and blood pressure control important in reducing the progression of atherosclerotic disease. On appropriate oral medications.

## 2021-06-27 ENCOUNTER — Other Ambulatory Visit: Payer: Self-pay | Admitting: Internal Medicine

## 2021-06-27 NOTE — Telephone Encounter (Signed)
Requested medication (s) are due for refill today: Yes  Requested medication (s) are on the active medication list: Yes  Last refill:  02/14/21 #90/0 RF  Future visit scheduled: Yes  Notes to clinic:  Unable to refill per protocol, cannot delegate.      Requested Prescriptions  Pending Prescriptions Disp Refills   calcitRIOL (ROCALTROL) 0.25 MCG capsule [Pharmacy Med Name: CALCITRIOL 0.25MCG CAPSULES] 90 capsule 0    Sig: TAKE 1 CAPSULE(0.25 MCG) BY MOUTH DAILY     Endocrinology:  Vitamins - Vitamin D Supplementation Failed - 06/27/2021 12:10 PM      Failed - 50,000 IU strengths are not delegated      Failed - Phosphate in normal range and within 360 days    Phosphorus  Date Value Ref Range Status  05/07/2021 8.5 (H) 2.5 - 4.6 mg/dL Final          Failed - Vitamin D in normal range and within 360 days    No results found for: [id number], [id number], [id number], 25OHVITD3, 25OHVITD2, 25OHVITD3, 25OHVITD2, 25OHVITD1, 25OHVITD2, 25OHVITD3, VD25OH        Passed - Ca in normal range and within 360 days    Calcium  Date Value Ref Range Status  05/07/2021 9.5 8.9 - 10.3 mg/dL Final   Calcium, Total  Date Value Ref Range Status  02/10/2012 8.8 8.5 - 10.1 mg/dL Final   Calcium, Ion  Date Value Ref Range Status  03/07/2020 1.21 1.15 - 1.40 mmol/L Final          Passed - Valid encounter within last 12 months    Recent Outpatient Visits           1 month ago Essential (primary) hypertension   [facility] Glean Hess, MD   7 months ago Type 2 diabetes mellitus with chronic kidney disease on chronic dialysis, without long-term current use of insulin [facility])   [facility] Glean Hess, MD   1 year ago Type 2 diabetes mellitus with chronic kidney disease on chronic dialysis, without long-term current use of insulin [facility])   [facility] Glean Hess, MD   1 year ago Type II diabetes mellitus with complication [facility])   [facility] Glean Hess, MD   1 year ago Essential (primary) hypertension   [facility] Glean Hess, MD       Future Appointments             In 4 months Hollice Espy, MD [place]   In 5 months Army Melia, Jesse Sans, MD [facility], [facility]

## 2021-06-30 ENCOUNTER — Other Ambulatory Visit: Payer: Self-pay | Admitting: Internal Medicine

## 2021-07-01 MED ORDER — CALCITRIOL 0.25 MCG PO CAPS: 0.2500 ug | ORAL_CAPSULE | Freq: Every day | ORAL | 3 refills | Status: DC

## 2021-09-08 ENCOUNTER — Encounter: Payer: Self-pay | Admitting: Internal Medicine

## 2021-10-15 LAB — BASIC METABOLIC PANEL
BUN: 69 — AB (ref 4–21)
CO2: 22 (ref 13–22)
Chloride: 94 — AB (ref 99–108)
Creatinine: 12.4 — AB (ref 0.6–1.3)
Potassium: 5.6 mEq/L — AB (ref 3.5–5.1)
Sodium: 135 — AB (ref 137–147)

## 2021-10-15 LAB — HEPATIC FUNCTION PANEL
ALT: 18 U/L (ref 10–40)
AST: 16 (ref 14–40)
Alkaline Phosphatase: 67 (ref 25–125)

## 2021-10-15 LAB — CBC AND DIFFERENTIAL
HCT: 44 (ref 41–53)
Hemoglobin: 14.9 (ref 13.5–17.5)
Platelets: 274 10*3/uL (ref 150–400)
WBC: 8.7

## 2021-11-18 NOTE — Progress Notes (Signed)
? ?11/19/21 ?10:46 AM  ? ?Benjamin Stewart ?1976/05/02 ?[id number] ? ?Referring provider:  ?Glean Hess, MD ?[address] ?[address] ?[place],  [place] [address] ?Chief Complaint  ?Patient presents with  ? Prostate Cancer  ? ? ? ? ?HPI: ?Benjamin Stewart is a 46 y.o.male with a personal history of ESRD on H, prostate cancer recurrence, and recurrent gross hematuria, who presents today for a follow-up.  ? ?He has a personal history of Gleason 3+4 intermediate risk prostate cancer, initially diagnosed in 2017 on active surveillance with a PSA which subsequently rose to 8.4 at the time of treatment.  He elected to pursue I-125 brachytherapy prostate seed implant completed on 11/2017. ? ?He was last seen in clinic on 12/20/2020 with Debroah Loop, PA-C.  ? ?He saw Dr. Rutherford Limerick for consideration of cryotherapy he  wanted him to have a second opinion at radiation oncology in June 2022. Radiation oncology were scheduled to undergo another PET scan that visualized  focal PSMA activity in the posterior right prostate consistent with locally recurrent disease. No evidence of nodal or visceral metastatic disease. No osseous metastatic disease. He was further referred back to medical oncology.  ? ?He underwent a prostate biopsy on 03/13/2021. TRUS volume of 24.0 mm x 37.6 mm x 38.7 mm for a total estimated volume of 18.3 cm^3.  Path: Gleason 3+3, Gleason 3+4, Gleason 4+3.  ? ?He was noted to have a PSA>2 he underwent a repeat prostate biopsy on 09/08/2021; TRUS 20.1 cc. Path: SV bxs neg. Multifocal GS 3+4 cancerHe was offered cryotherapy and genomic testing. He agreed to genomic testing. He declined cryotherapy. He is not a candidate for salvage radical prostatectomy.  ? ?He is notably being followed by [facility] oncology for prostate cancer he was last seen on 10/15/2021  ? ?He reports that his is primarily being followed by Dr Thomos Lemons.  ? ?His has no new urinary symptoms.  ? ?He reports that his father passed away from  metastatic prostate cancer and his brother passed of sepsis. They both recently passed away.   ? ?PMH: ?Past Medical History:  ?Diagnosis Date  ? Anemia   ? Benign essential hematuria 12/04/2014  ? microscopic hematuria evaluation with CT and cysto - treated for 2 months with bactrim DS; no further f/u planned unless hematuria recurs.   ? Chronic kidney disease   ? Controlled type 2 diabetes mellitus without complication, without long-term current use of insulin ([place]) 07/16/2015  ? COVID-19 virus infection 12/29/2019  ? Essential (primary) hypertension 12/04/2014  ? History of kidney stones   ? H/O  ? Malignant essential hypertension 12/04/2014  ? prostate cancer   ? PROSTATE   ? Sleep apnea   ? DOES NOT USE CPAP  ? ? ?Surgical History: ?Past Surgical History:  ?Procedure Laterality Date  ? A/V FISTULAGRAM Left 05/30/2020  ? Procedure: A/V FISTULAGRAM;  Surgeon: Algernon Huxley, MD;  Location: [facility];  Service: Cardiovascular;  Laterality: Left;  ? AV FISTULA PLACEMENT Left 03/07/2020  ? Procedure: ARTERIOVENOUS (AV) FISTULA CREATION BRACHIAL CEPHALIC;  Surgeon: Algernon Huxley, MD;  Location: [facility];  Service: Vascular;  Laterality: Left;  ? DIALYSIS/PERMA CATHETER INSERTION N/A 01/17/2020  ? Procedure: DIALYSIS/PERMA CATHETER INSERTION;  Surgeon: Algernon Huxley, MD;  Location: [facility];  Service: Cardiovascular;  Laterality: N/A;  ? DIALYSIS/PERMA CATHETER REMOVAL N/A 10/07/2020  ? Procedure: DIALYSIS/PERMA CATHETER REMOVAL;  Surgeon: Algernon Huxley, MD;  Location: [facility];  Service: Cardiovascular;  Laterality: N/A;  ? PARTIAL COLECTOMY  2005  ? RADIOACTIVE SEED IMPLANT N/A 12/06/2017  ? Procedure: RADIOACTIVE SEED IMPLANT/BRACHYTHERAPY IMPLANT;  Surgeon: Hollice Espy, MD;  Location: [facility];  Service: Urology;  Laterality: N/A;  ? UPPER EXTREMITY ANGIOGRAPHY Left 04/18/2020  ? Procedure: UPPER EXTREMITY ANGIOGRAPHY;  Surgeon: Algernon Huxley, MD;  Location: [facility];  Service:  Cardiovascular;  Laterality: Left;  ? ? ?Home Medications:  ?Allergies as of 11/19/2021   ? ?   Reactions  ? Glimepiride Other (See Comments)  ? Severe hypoglycemia  ? Other   ? Percocet [oxycodone-acetaminophen] Nausea And Vomiting  ? ?  ? ?  ?Medication List  ?  ? ?  ? Accurate as of November 19, 2021 10:46 AM. If you have any questions, ask your nurse or doctor.  ?  ?  ? ?  ? ?STOP taking these medications   ? ?sevelamer carbonate 800 MG tablet ?Commonly known as: RENVELA ?  ? ?  ? ?TAKE these medications   ? ?amLODipine 10 MG tablet ?Commonly known as: NORVASC ?Take 1 tablet (10 mg total) by mouth daily. ?  ?aspirin EC 81 MG tablet ?Take 1 tablet (81 mg total) by mouth daily. ?What changed: Another medication with the same name was removed. Continue taking this medication, and follow the directions you see here. ?  ?atorvastatin 10 MG tablet ?Commonly known as: LIPITOR ?Take 1 tablet (10 mg total) by mouth daily. ?  ?calcitRIOL 0.25 MCG capsule ?Commonly known as: ROCALTROL ?Take 1 capsule (0.25 mcg total) by mouth daily. ?  ?carvedilol 6.25 MG tablet ?Commonly known as: COREG ?Take 6.25 mg by mouth 2 (two) times daily with a meal. ?  ?cinacalcet 30 MG tablet ?Commonly known as: SENSIPAR ?Take 30 mg by mouth daily. ?  ?clopidogrel 75 MG tablet ?Commonly known as: PLAVIX ?TAKE 1 TABLET(75 MG) BY MOUTH DAILY ?  ?furosemide 80 MG tablet ?Commonly known as: LASIX ?Take 80 mg by mouth daily. ?  ?lanthanum 1000 MG chewable tablet ?Commonly known as: FOSRENOL ?Chew 1,000 mg by mouth 3 (three) times daily. ?  ?mometasone 0.1 % cream ?Commonly known as: ELOCON ?Apply topically daily. ?  ? ?  ? ? ?Allergies:  ?Allergies  ?Allergen Reactions  ? Glimepiride Other (See Comments)  ?  Severe hypoglycemia  ? Other   ? Percocet [Oxycodone-Acetaminophen] Nausea And Vomiting  ? ? ?Family History: ?Family History  ?Problem Relation Age of Onset  ? Diabetes Mother   ? Heart disease Mother   ? Hypertension Mother   ? Glaucoma Mother   ?  Prostate cancer Father   ? Diabetes Maternal Grandmother   ? Diabetes Maternal Grandfather   ? Prostate cancer Maternal Uncle   ? ? ?Social History:  reports that he has never smoked. He has never used smokeless tobacco. He reports that he does not drink alcohol and does not use drugs. ? ? ?Physical Exam: ?BP 128/84   Pulse 82   Ht '5\' 11"'$  (1.803 m)   Wt 265 lb (120.2 kg)   BMI 36.96 kg/m?   ?Constitutional:  Alert and oriented, No acute distress. ?HEENT: [place] AT, moist mucus membranes.  Trachea midline, no masses. ?Cardiovascular: No clubbing, cyanosis, or edema. ?Respiratory: Normal respiratory effort, no increased work of breathing. ?Skin: No rashes, bruises or suspicious lesions. ?Neurologic: Grossly intact, no focal deficits, moving all 4 extremities. ?Psychiatric: Normal mood and affect. ? ?Laboratory Data: ? ?Lab Results  ?Component Value Date  ?  CREATININE 12.22 (H) 05/07/2021  ? ?Lab Results  ?Component Value Date  ? HGBA1C 5.4 05/23/2021  ? ? ?Assessment & Plan:   ? ?Prostate cancer  ?- Focal recurrence s/p brachytherapy followed by [facility] oncologist Dr. Thomos Lemons  ?All records reviewed  ?- Agree with medical oncology plan at this tim in absence of no urinary symptoms will release care to [facility].  ? ? ?I,Kailey Littlejohn,acting as a scribe for Hollice Espy, MD.,have documented all relevant documentation on the behalf of Hollice Espy, MD,as directed by  Hollice Espy, MD while in the presence of Hollice Espy, MD. ? ?I have reviewed the above documentation for accuracy and completeness, and I agree with the above.  ? ?Hollice Espy, MD ? ? ?[facility] ?[address] ?[place], [place] [address] ?[phone] ?

## 2021-11-19 ENCOUNTER — Ambulatory Visit (INDEPENDENT_AMBULATORY_CARE_PROVIDER_SITE_OTHER): Payer: 59 | Admitting: Urology

## 2021-11-19 VITALS — BP 128/84 | HR 82 | Ht 71.0 in | Wt 265.0 lb

## 2021-11-19 DIAGNOSIS — R350 Frequency of micturition: Secondary | ICD-10-CM

## 2021-11-19 DIAGNOSIS — R9721 Rising PSA following treatment for malignant neoplasm of prostate: Secondary | ICD-10-CM | POA: Diagnosis not present

## 2021-11-26 ENCOUNTER — Ambulatory Visit (INDEPENDENT_AMBULATORY_CARE_PROVIDER_SITE_OTHER): Payer: 59 | Admitting: Internal Medicine

## 2021-11-26 ENCOUNTER — Encounter: Payer: Self-pay | Admitting: Internal Medicine

## 2021-11-26 VITALS — BP 128/70 | HR 78 | Ht 71.0 in | Wt 264.0 lb

## 2021-11-26 DIAGNOSIS — M1A09X0 Idiopathic chronic gout, multiple sites, without tophus (tophi): Secondary | ICD-10-CM | POA: Diagnosis not present

## 2021-11-26 DIAGNOSIS — N186 End stage renal disease: Secondary | ICD-10-CM

## 2021-11-26 DIAGNOSIS — E782 Mixed hyperlipidemia: Secondary | ICD-10-CM

## 2021-11-26 DIAGNOSIS — N2581 Secondary hyperparathyroidism of renal origin: Secondary | ICD-10-CM

## 2021-11-26 DIAGNOSIS — E785 Hyperlipidemia, unspecified: Secondary | ICD-10-CM

## 2021-11-26 DIAGNOSIS — I10 Essential (primary) hypertension: Secondary | ICD-10-CM | POA: Diagnosis not present

## 2021-11-26 DIAGNOSIS — Z1211 Encounter for screening for malignant neoplasm of colon: Secondary | ICD-10-CM | POA: Diagnosis not present

## 2021-11-26 DIAGNOSIS — E1169 Type 2 diabetes mellitus with other specified complication: Secondary | ICD-10-CM

## 2021-11-26 DIAGNOSIS — C61 Malignant neoplasm of prostate: Secondary | ICD-10-CM

## 2021-11-26 MED ORDER — ATORVASTATIN CALCIUM 10 MG PO TABS: 10.0000 mg | ORAL_TABLET | Freq: Every day | ORAL | 3 refills | Status: DC

## 2021-11-26 MED ORDER — AMLODIPINE BESYLATE 10 MG PO TABS: 10.0000 mg | ORAL_TABLET | Freq: Every day | ORAL | 3 refills | Status: DC

## 2021-11-26 NOTE — Progress Notes (Signed)
? ? ?Date:  11/26/2021  ? ?Name:  Benjamin Stewart   DOB:  20-Sep-1975   MRN:  [id number] ? ? ?Chief Complaint: Annual Exam ?Benjamin Stewart is a 46 y.o. male who presents today for his Complete Annual Exam. He feels well. He reports exercising none. He reports he is sleeping well.  ? ?Colonoscopy: 09/2020 - no polyps, pill fragments; repeat 7 yrs ? ?Immunization History  ?Administered Date(s) Administered  ? Hepatitis B, adult 03/08/2020, 04/08/2020, 05/06/2020, 01/20/2021, 02/17/2021, 03/25/2021, 06/28/2021, 07/31/2021, 08/28/2021, 09/27/2021  ? Influenza,inj,Quad PF,6+ Mos 05/22/2014, 08/18/2016, 08/19/2017, 07/25/2018, 05/17/2019, 05/31/2020, 04/10/2021  ? Influenza-Unspecified 05/22/2014, 06/14/2015, 08/18/2016, 08/19/2017, 07/25/2018, 05/17/2019, 01/22/2020, 05/31/2020  ? Moderna SARS-COV2 Booster Vaccination 09/20/2020  ? PFIZER Comirnaty(Gray Top)Covid-19 Tri-Sucrose Vaccine 11/02/2019, 11/23/2019  ? PFIZER(Purple Top)SARS-COV-2 Vaccination 11/02/2019, 11/23/2019  ? PPD Test 03/11/2020, 03/10/2021, 06/17/2021  ? Pneumococcal Conjugate-13 07/17/2020  ? Pneumococcal Polysaccharide-23 07/25/2018  ? Pneumococcal-Unspecified 06/14/2015, 07/17/2020  ? Tdap 06/14/2015  ? ?There are no preventive care reminders to display for this patient. ?  ?Lab Results  ?Component Value Date  ? PSA1 5.7 (H) 09/03/2020  ? PSA1 3.8 05/07/2020  ? PSA1 2.3 04/27/2019  ? ? ? ?Hypertension ?This is a chronic problem. The problem is controlled. Pertinent negatives include no chest pain, headaches, palpitations or shortness of breath. Past treatments include calcium channel blockers, beta blockers and diuretics. The current treatment provides significant improvement. Hypertensive end-organ damage includes kidney disease (ESRD).  ?Hyperlipidemia ?This is a chronic problem. The problem is controlled. Pertinent negatives include no chest pain, myalgias or shortness of breath. Current antihyperlipidemic treatment includes statins.  ? ?Lab  Results  ?Component Value Date  ? NA 135 (A) 10/15/2021  ? K 5.6 (A) 10/15/2021  ? CO2 22 10/15/2021  ? GLUCOSE 108 (H) 05/07/2021  ? BUN 69 (A) 10/15/2021  ? CREATININE 12.4 (A) 10/15/2021  ? CALCIUM 9.5 05/07/2021  ? GFRNONAA 5 (L) 05/07/2021  ? ?Lab Results  ?Component Value Date  ? CHOL 101 08/21/2019  ? HDL 25 (L) 08/21/2019  ? Velma 59 08/21/2019  ? TRIG 86 08/21/2019  ? CHOLHDL 4.0 08/21/2019  ? ?Lab Results  ?Component Value Date  ? TSH 1.173 11/09/2019  ? ?Lab Results  ?Component Value Date  ? HGBA1C 5.4 05/23/2021  ? ?Lab Results  ?Component Value Date  ? WBC 8.7 10/15/2021  ? HGB 14.9 10/15/2021  ? HCT 44 10/15/2021  ? MCV 95.1 05/03/2021  ? PLT 274 10/15/2021  ? ?Lab Results  ?Component Value Date  ? ALT 18 10/15/2021  ? AST 16 10/15/2021  ? ALKPHOS 67 10/15/2021  ? BILITOT 0.6 11/09/2019  ? ?No results found for: 25OHVITD2, [place], VD25OH  ? ?Review of Systems  ?Constitutional:  Negative for appetite change, chills, diaphoresis, fatigue and unexpected weight change.  ?HENT:  Negative for hearing loss, tinnitus, trouble swallowing and voice change.   ?Eyes:  Negative for visual disturbance.  ?Respiratory:  Negative for choking, shortness of breath and wheezing.   ?Cardiovascular:  Negative for chest pain, palpitations and leg swelling.  ?Gastrointestinal:  Negative for abdominal pain, blood in stool, constipation and diarrhea.  ?Genitourinary:  Negative for dysuria and hematuria.  ?Musculoskeletal:  Negative for arthralgias, back pain and myalgias.  ?Skin:  Negative for color change and rash.  ?Neurological:  Negative for dizziness, syncope and headaches.  ?Hematological:  Negative for adenopathy.  ?Psychiatric/Behavioral:  Negative for dysphoric mood and sleep disturbance. The patient is not nervous/anxious.   ? ?Patient Active Problem  List  ? Diagnosis Date Noted  ? End stage renal disease ([place]) 04/11/2020  ? Secondary hyperparathyroidism of renal origin ([facility]) 04/24/2019  ? Hyperkalemia 04/24/2019   ? Localized edema 01/19/2019  ? Chronic gout of multiple sites 08/16/2018  ? Prostate cancer ([place]) 08/23/2015  ? Hyperlipidemia, mixed 07/16/2015  ? Essential (primary) hypertension 12/04/2014  ? ? ?Allergies  ?Allergen Reactions  ? Glimepiride Other (See Comments)  ?  Severe hypoglycemia  ? Other   ? Percocet [Oxycodone-Acetaminophen] Nausea And Vomiting  ? ? ?Past Surgical History:  ?Procedure Laterality Date  ? A/V FISTULAGRAM Left 05/30/2020  ? Procedure: A/V FISTULAGRAM;  Surgeon: Algernon Huxley, MD;  Location: [facility];  Service: Cardiovascular;  Laterality: Left;  ? AV FISTULA PLACEMENT Left 03/07/2020  ? Procedure: ARTERIOVENOUS (AV) FISTULA CREATION BRACHIAL CEPHALIC;  Surgeon: Algernon Huxley, MD;  Location: [facility];  Service: Vascular;  Laterality: Left;  ? DIALYSIS/PERMA CATHETER INSERTION N/A 01/17/2020  ? Procedure: DIALYSIS/PERMA CATHETER INSERTION;  Surgeon: Algernon Huxley, MD;  Location: [facility];  Service: Cardiovascular;  Laterality: N/A;  ? DIALYSIS/PERMA CATHETER REMOVAL N/A 10/07/2020  ? Procedure: DIALYSIS/PERMA CATHETER REMOVAL;  Surgeon: Algernon Huxley, MD;  Location: [facility];  Service: Cardiovascular;  Laterality: N/A;  ? PARTIAL COLECTOMY  2005  ? RADIOACTIVE SEED IMPLANT N/A 12/06/2017  ? Procedure: RADIOACTIVE SEED IMPLANT/BRACHYTHERAPY IMPLANT;  Surgeon: Hollice Espy, MD;  Location: [facility];  Service: Urology;  Laterality: N/A;  ? UPPER EXTREMITY ANGIOGRAPHY Left 04/18/2020  ? Procedure: UPPER EXTREMITY ANGIOGRAPHY;  Surgeon: Algernon Huxley, MD;  Location: [facility];  Service: Cardiovascular;  Laterality: Left;  ? ? ?Social History  ? ?Tobacco Use  ? Smoking status: Never  ? Smokeless tobacco: Never  ?Vaping Use  ? Vaping Use: Never used  ?Substance Use Topics  ? Alcohol use: No  ?  Alcohol/week: 0.0 standard drinks  ? Drug use: No  ? ? ? ?Medication list has been reviewed and updated. ? ?Current Meds  ?Medication Sig  ? amLODipine (NORVASC) 10 MG  tablet Take 1 tablet (10 mg total) by mouth daily.  ? aspirin EC 81 MG tablet Take 1 tablet (81 mg total) by mouth daily.  ? atorvastatin (LIPITOR) 10 MG tablet Take 1 tablet (10 mg total) by mouth daily.  ? calcitRIOL (ROCALTROL) 0.25 MCG capsule Take 1 capsule (0.25 mcg total) by mouth daily.  ? carvedilol (COREG) 6.25 MG tablet Take 6.25 mg by mouth 2 (two) times daily with a meal.   ? cinacalcet (SENSIPAR) 30 MG tablet Take 30 mg by mouth daily.  ? clopidogrel (PLAVIX) 75 MG tablet TAKE 1 TABLET(75 MG) BY MOUTH DAILY  ? furosemide (LASIX) 80 MG tablet Take 80 mg by mouth daily.  ? lanthanum (FOSRENOL) 1000 MG chewable tablet Chew 1,000 mg by mouth 3 (three) times daily.  ? mometasone (ELOCON) 0.1 % cream Apply topically daily.  ? ? ? ?  11/26/2021  ?  8:46 AM 05/23/2021  ?  1:55 PM 11/20/2020  ?  1:34 PM 05/23/2020  ? 11:07 AM  ?GAD 7 : Generalized Anxiety Score  ?Nervous, Anxious, on Edge 0 0 0 0  ?Control/stop worrying 0 0 0 0  ?Worry too much - different things 0 0 0 0  ?Trouble relaxing 0 0 0 0  ?Restless 0 0 0 0  ?Easily annoyed or irritable 0 0 0 0  ?Afraid - awful might happen 0 0 0 0  ?  Total GAD 7 Score 0 0 0 0  ?Anxiety Difficulty Not difficult at all Not difficult at all  Not difficult at all  ? ? ? ?  11/26/2021  ?  8:45 AM  ?Depression screen PHQ 2/9  ?Decreased Interest 0  ?Down, Depressed, Hopeless 0  ?PHQ - 2 Score 0  ?Altered sleeping 0  ?Tired, decreased energy 0  ?Change in appetite 0  ?Feeling bad or failure about yourself  0  ?Trouble concentrating 0  ?Moving slowly or fidgety/restless 0  ?Suicidal thoughts 0  ?PHQ-9 Score 0  ?Difficult doing work/chores Not difficult at all  ? ? ?BP Readings from Last 3 Encounters:  ?11/26/21 128/70  ?11/19/21 128/84  ?06/24/21 (!) 149/91  ? ? ?Physical Exam ?Vitals and nursing note reviewed.  ?Constitutional:   ?   Appearance: Normal appearance. He is well-developed.  ?HENT:  ?   Head: Normocephalic.  ?   Right Ear: Tympanic membrane, ear canal and external  ear normal.  ?   Left Ear: Tympanic membrane, ear canal and external ear normal.  ?   Nose: Nose normal.  ?Eyes:  ?   Conjunctiva/sclera: Conjunctivae normal.  ?   Pupils: Pupils are equal, round, and reactive

## 2021-11-27 LAB — LIPID PANEL
Chol/HDL Ratio: 3.3 ratio (ref 0.0–5.0)
Cholesterol, Total: 117 mg/dL (ref 100–199)
HDL: 36 mg/dL — ABNORMAL LOW (ref >39)
LDL Chol Calc (NIH): 64 mg/dL (ref 0–99)
Triglycerides: 85 mg/dL (ref 0–149)
VLDL Cholesterol Cal: 17 mg/dL (ref 5–40)

## 2021-11-27 LAB — HEMOGLOBIN A1C
Est. average glucose Bld gHb Est-mCnc: 108 mg/dL
Hgb A1c MFr Bld: 5.4 % (ref 4.8–5.6)

## 2021-11-27 LAB — URIC ACID: Uric Acid: 5.7 mg/dL (ref 3.8–8.4)

## 2021-12-11 ENCOUNTER — Encounter: Payer: Self-pay | Admitting: Internal Medicine

## 2021-12-11 NOTE — Telephone Encounter (Signed)
Please review.  KP

## 2021-12-23 ENCOUNTER — Ambulatory Visit (INDEPENDENT_AMBULATORY_CARE_PROVIDER_SITE_OTHER): Payer: 59 | Admitting: Vascular Surgery

## 2021-12-23 ENCOUNTER — Ambulatory Visit (INDEPENDENT_AMBULATORY_CARE_PROVIDER_SITE_OTHER): Payer: 59

## 2021-12-23 DIAGNOSIS — N186 End stage renal disease: Secondary | ICD-10-CM | POA: Diagnosis not present

## 2021-12-26 ENCOUNTER — Encounter (INDEPENDENT_AMBULATORY_CARE_PROVIDER_SITE_OTHER): Payer: Self-pay | Admitting: *Deleted

## 2022-02-06 IMAGING — MR MR PELVIS WO/W CM
6 of 8 series · 32 of 48 positions shown · IV contrast (10ml Gadavist)
Comparison: PET of 09/25/2020.  Prior prostate MRI of 08/04/2017.

CLINICAL DATA: History of prostate cancer. On dialysis. Impending
renal transplant. Status post radiation therapy in 4880.

EXAM:
MRI PELVIS WITHOUT AND WITH CONTRAST
TECHNIQUE: Multiplanar multisequence MR imaging of the pelvis was performed
both before and after administration of intravenous contrast.
CONTRAST:  10mL GADAVIST GADOBUTROL 1 MMOL/ML IV SOLN

[Series 8: T1 · axial · 4.0mm · 0.78mm/px · z∈[-104,+151]mm · 6 of 52 slices shown (1 of 2)]
[im 1/52]
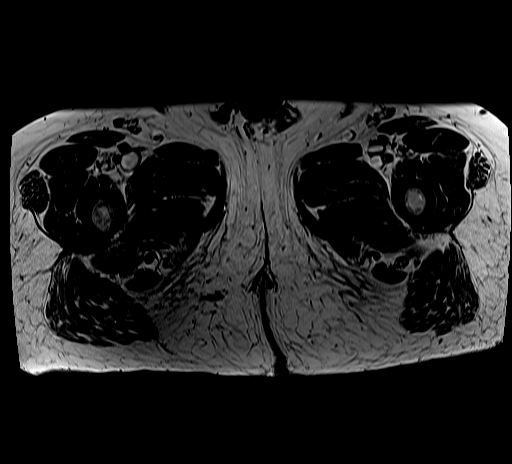
[im 11/52]
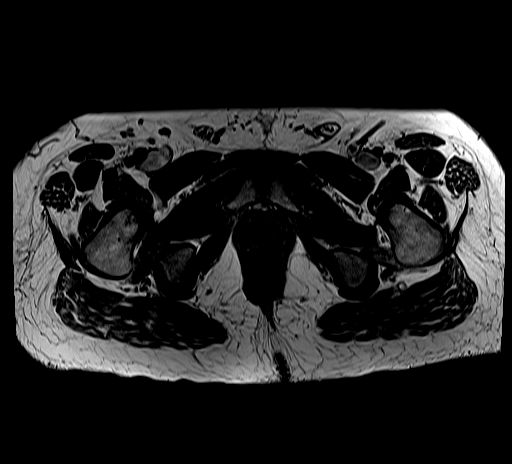
[im 21/52]
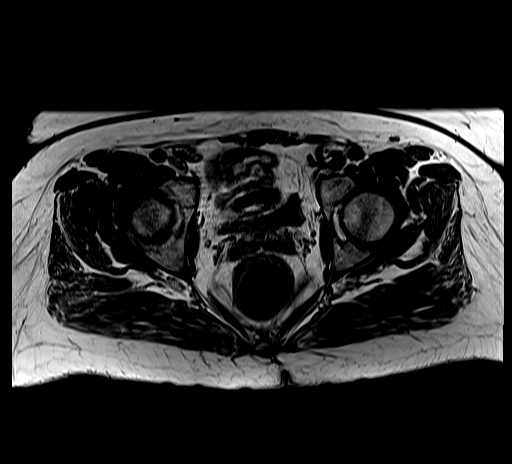
[im 31/52]
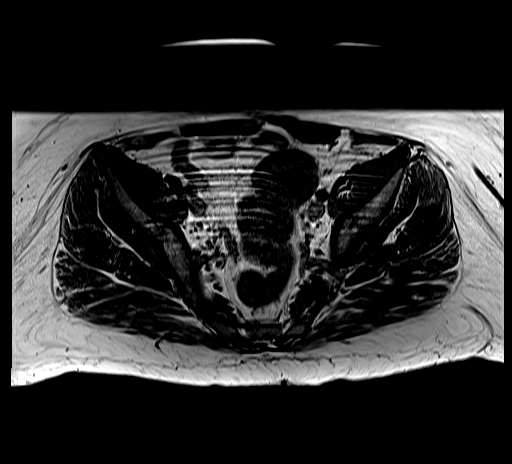
[im 41/52]
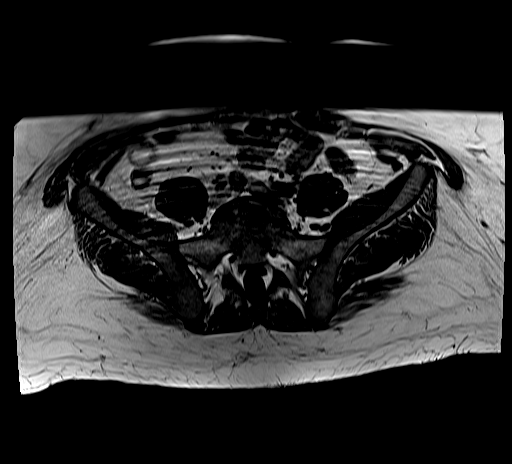
[im 52/52]
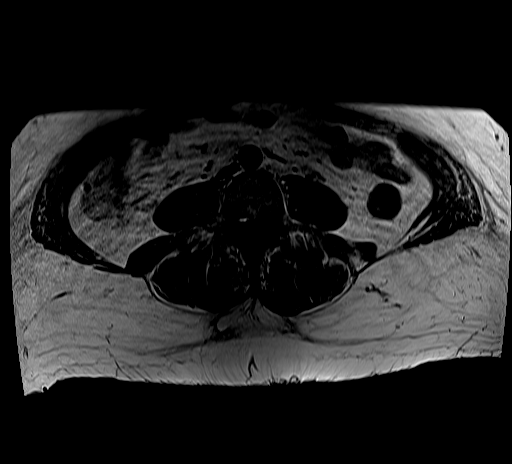

[Series 9: T2 fat-sat · axial · 4.0mm · 0.78mm/px · z∈[-104,+151]mm · 6 of 52 slices shown (1 of 2)]
[im 1/52]
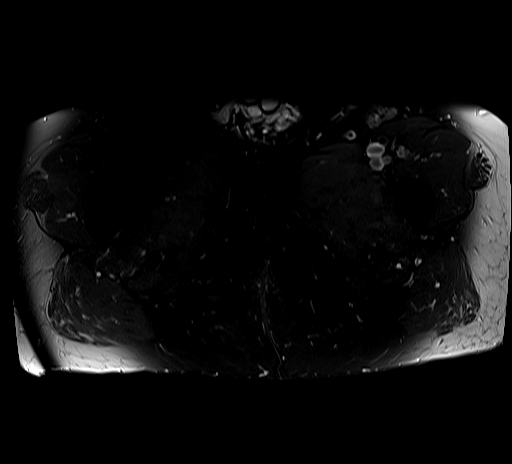
[im 11/52]
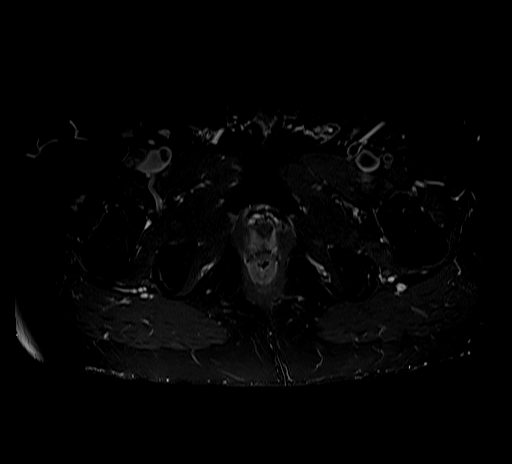
[im 21/52]
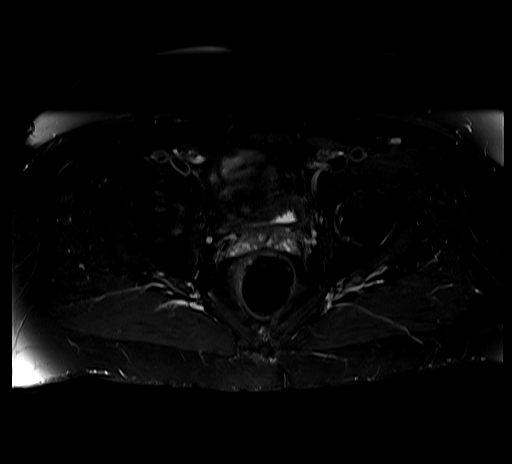
[im 31/52]
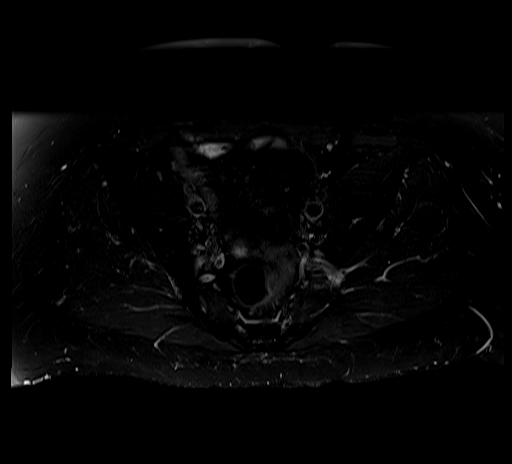
[im 41/52]
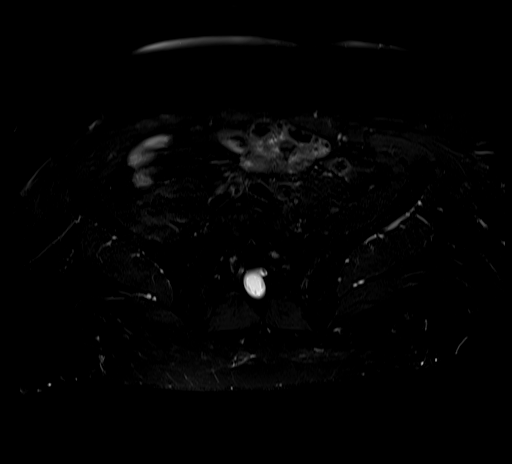
[im 52/52]
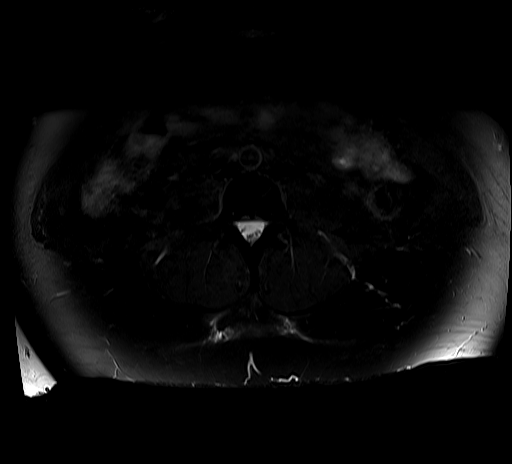

[Series 10: STIR · coronal · 4.0mm · 1.25mm/px · 5 of 40 slices shown]
[im 1/40]
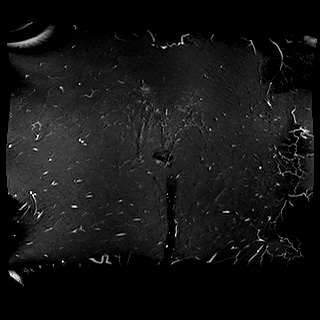
[im 10/40]
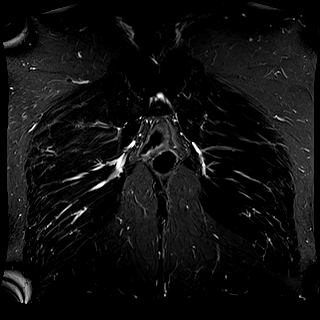
[im 20/40]
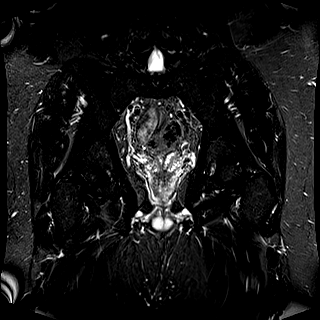
[im 30/40]
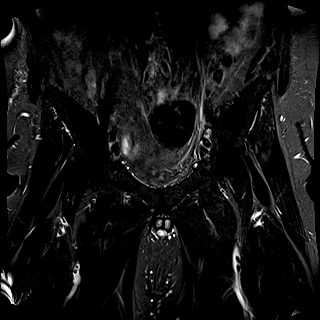
[im 40/40]
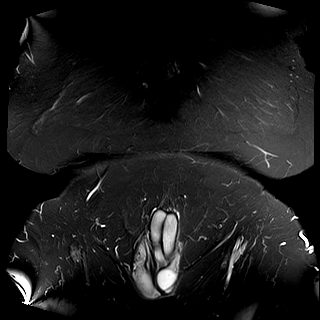

[Series 11: T1 · coronal · 4.0mm · 1.25mm/px · 5 of 40 slices shown (2 of 2)]
[im 1/40]
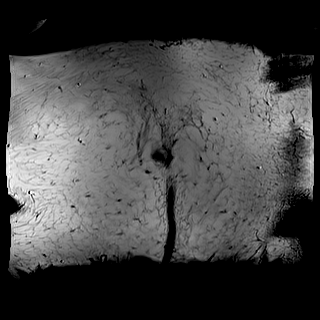
[im 10/40]
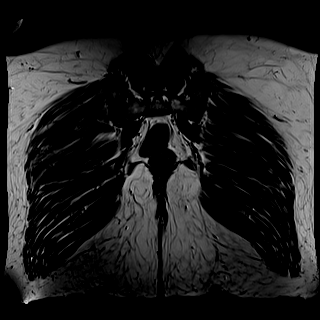
[im 20/40]
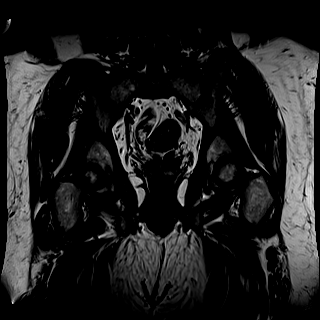
[im 30/40]
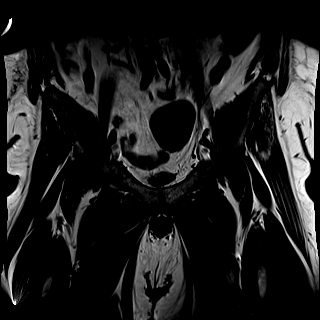
[im 40/40]
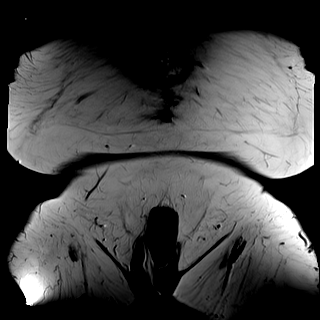

[Series 12: T2 fat-sat · sagittal · 4.0mm · 0.94mm/px · 8 of 72 slices shown (2 of 2)]
[im 1/72]
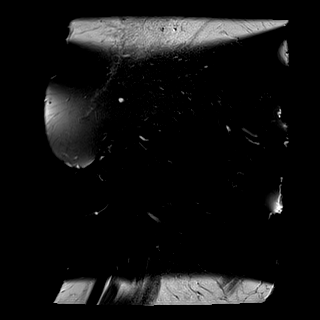
[im 9/72]
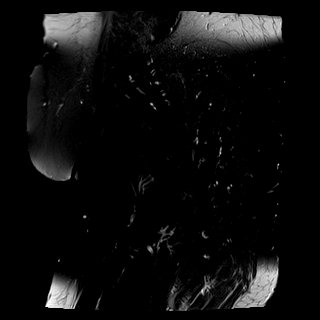
[im 18/72]
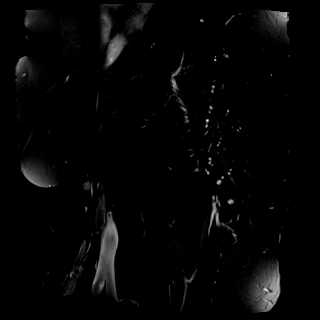
[im 27/72]
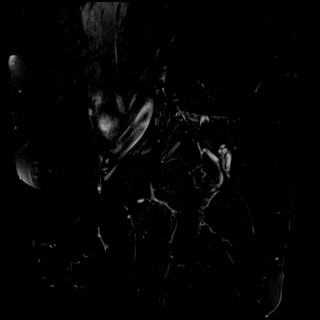
[im 45/72]
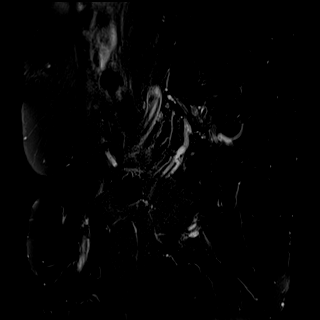
[im 54/72]
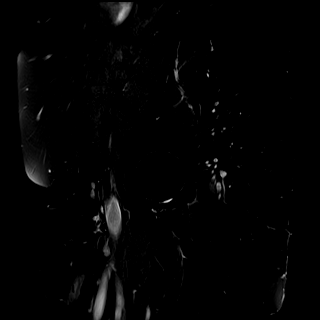
[im 63/72]
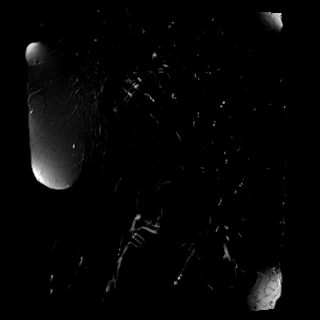
[im 72/72]
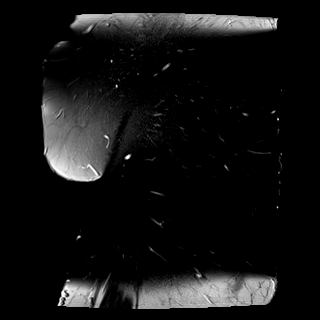

[Series 13: T1 fat-sat post-contrast · axial · non-contrast · 4.0mm · 1.25mm/px · z∈[-104,-54]mm · 2 of 52 slices shown]
[im 1/52]
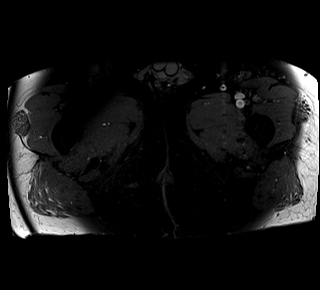
[im 11/52]
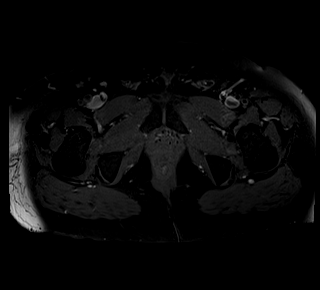

[32 of 48 positions shown; findings below may reference images not displayed]

FINDINGS: Urinary Tract:  No distal hydroureter.  Decompressed urinary bladder

Bowel: Large amount of stool within the rectum and sigmoid. Normal
small bowel caliber.

Vascular/Lymphatic: No pelvic sidewall adenopathy or aneurysm.

Reproductive: Radiation seeds within the prostate. The exam was not
protocol or performed to evaluate the prostate optimally. No border
deforming residual or recurrent prostate mass identified. Symmetric
seminal vesicles.

Other:  No significant free fluid.

Musculoskeletal: Left iliac wing vague 6 mm T2 hyperintense lesion
on [DATE] and [DATE] was similar back to 04/19/2018 CT, favoring a
benign lesion such as a hemangioma.
IMPRESSION: 1. No evidence of pelvic nodal or osseous metastasis.
2. Radiation seeds in the prostate. Exam not optimally performed to
evaluate residual/recurrent prostate primary.
3.  Possible constipation.

## 2022-04-29 ENCOUNTER — Other Ambulatory Visit (INDEPENDENT_AMBULATORY_CARE_PROVIDER_SITE_OTHER): Payer: Self-pay | Admitting: Nurse Practitioner

## 2022-06-08 ENCOUNTER — Encounter (INDEPENDENT_AMBULATORY_CARE_PROVIDER_SITE_OTHER): Payer: Self-pay

## 2022-06-15 LAB — PSA: PSA: 11

## 2022-06-17 ENCOUNTER — Other Ambulatory Visit (INDEPENDENT_AMBULATORY_CARE_PROVIDER_SITE_OTHER): Payer: Self-pay | Admitting: Vascular Surgery

## 2022-06-17 DIAGNOSIS — N186 End stage renal disease: Secondary | ICD-10-CM

## 2022-06-23 ENCOUNTER — Ambulatory Visit (INDEPENDENT_AMBULATORY_CARE_PROVIDER_SITE_OTHER): Payer: 59 | Admitting: Nurse Practitioner

## 2022-06-23 ENCOUNTER — Encounter (INDEPENDENT_AMBULATORY_CARE_PROVIDER_SITE_OTHER): Payer: Self-pay | Admitting: Nurse Practitioner

## 2022-06-23 ENCOUNTER — Ambulatory Visit (INDEPENDENT_AMBULATORY_CARE_PROVIDER_SITE_OTHER): Payer: 59

## 2022-06-23 VITALS — BP 116/81 | HR 92 | Resp 16 | Wt 278.2 lb

## 2022-06-23 DIAGNOSIS — N186 End stage renal disease: Secondary | ICD-10-CM | POA: Diagnosis not present

## 2022-06-23 DIAGNOSIS — I10 Essential (primary) hypertension: Secondary | ICD-10-CM

## 2022-06-23 DIAGNOSIS — E782 Mixed hyperlipidemia: Secondary | ICD-10-CM

## 2022-07-06 ENCOUNTER — Encounter (INDEPENDENT_AMBULATORY_CARE_PROVIDER_SITE_OTHER): Payer: Self-pay | Admitting: Nurse Practitioner

## 2022-07-06 NOTE — Progress Notes (Signed)
Subjective:    Patient ID: Benjamin Stewart, male    DOB: 05-05-1976, 46 y.o.   MRN: [id number] Chief Complaint  Patient presents with   Follow-up    Ultrasound follow up    The patient returns to the office for followup of their dialysis access.   The patient reports the function of the access has been stable. Patient denies difficulty with cannulation. The patient denies increased bleeding time after removing the needles. The patient denies hand pain or other symptoms consistent with steal phenomena.  No significant arm swelling.  The patient denies any complaints from the dialysis center or their nephrologist.  The patient denies redness or swelling at the access site. The patient denies fever or chills at home or while on dialysis.  No recent shortening of the patient's walking distance or new symptoms consistent with claudication.  No history of rest pain symptoms. No new ulcers or wounds of the lower extremities have occurred.  The patient denies amaurosis fugax or recent TIA symptoms. There are no recent neurological changes noted. There is no history of DVT, PE or superficial thrombophlebitis. No recent episodes of angina or shortness of breath documented.   Duplex ultrasound of the AV access shows a patent access.  The previously noted stenosis is not significantly changed compared to last study.  Flow volume today is 2422 cc/min (previous flow volume was 2616 cc/min)      Review of Systems  All other systems reviewed and are negative.      Objective:   Physical Exam Vitals reviewed.  HENT:     Head: Normocephalic.  Cardiovascular:     Rate and Rhythm: Normal rate.     Pulses:          Radial pulses are 2+ on the left side.     Arteriovenous access: Left arteriovenous access is present.    Comments: Good thrill and bruit Pulmonary:     Effort: Pulmonary effort is normal.  Skin:    General: Skin is warm and dry.  Neurological:     Mental Status: He is alert  and oriented to person, place, and time.  Psychiatric:        Mood and Affect: Mood normal.        Behavior: Behavior normal.        Thought Content: Thought content normal.        Judgment: Judgment normal.     BP 116/81 (BP Location: Right Arm)   Pulse 92   Resp 16   Wt 278 lb 3.2 oz (126.2 kg)   BMI 38.80 kg/m   Past Medical History:  Diagnosis Date   Anemia    Benign essential hematuria 12/04/2014   microscopic hematuria evaluation with CT and cysto - treated for 2 months with bactrim DS; no further f/u planned unless hematuria recurs.    Chronic kidney disease    Controlled type 2 diabetes mellitus without complication, without long-term current use of insulin ([place]) 07/16/2015   COVID-19 virus infection 12/29/2019   Essential (primary) hypertension 12/04/2014   History of kidney stones    H/O   Malignant essential hypertension 12/04/2014   prostate cancer    PROSTATE    Sleep apnea    DOES NOT USE CPAP    Social History   Socioeconomic History   Marital status: Divorced    Spouse name: Not on file   Number of children: Not on file   Years of education: Not on file  Highest education level: Not on file  Occupational History   Not on file  Tobacco Use   Smoking status: Never   Smokeless tobacco: Never  Vaping Use   Vaping Use: Never used  Substance and Sexual Activity   Alcohol use: No    Alcohol/week: 0.0 standard drinks of alcohol   Drug use: No   Sexual activity: Yes  Other Topics Concern   Not on file  Social History Narrative   Not on file   Social Determinants of Health   Financial Resource Strain: Not on file  Food Insecurity: Not on file  Transportation Needs: Not on file  Physical Activity: Not on file  Stress: Not on file  Social Connections: Not on file  Intimate Partner Violence: Not on file    Past Surgical History:  Procedure Laterality Date   A/V FISTULAGRAM Left 05/30/2020   Procedure: A/V FISTULAGRAM;  Surgeon: Algernon Huxley, MD;   Location: [facility];  Service: Cardiovascular;  Laterality: Left;   AV FISTULA PLACEMENT Left 03/07/2020   Procedure: ARTERIOVENOUS (AV) FISTULA CREATION BRACHIAL CEPHALIC;  Surgeon: Algernon Huxley, MD;  Location: [facility];  Service: Vascular;  Laterality: Left;   DIALYSIS/PERMA CATHETER INSERTION N/A 01/17/2020   Procedure: DIALYSIS/PERMA CATHETER INSERTION;  Surgeon: Algernon Huxley, MD;  Location: [facility];  Service: Cardiovascular;  Laterality: N/A;   DIALYSIS/PERMA CATHETER REMOVAL N/A 10/07/2020   Procedure: DIALYSIS/PERMA CATHETER REMOVAL;  Surgeon: Algernon Huxley, MD;  Location: [facility];  Service: Cardiovascular;  Laterality: N/A;   PARTIAL COLECTOMY  2005   RADIOACTIVE SEED IMPLANT N/A 12/06/2017   Procedure: RADIOACTIVE SEED IMPLANT/BRACHYTHERAPY IMPLANT;  Surgeon: Hollice Espy, MD;  Location: [facility];  Service: Urology;  Laterality: N/A;   UPPER EXTREMITY ANGIOGRAPHY Left 04/18/2020   Procedure: UPPER EXTREMITY ANGIOGRAPHY;  Surgeon: Algernon Huxley, MD;  Location: [facility];  Service: Cardiovascular;  Laterality: Left;    Family History  Problem Relation Age of Onset   Diabetes Mother    Heart disease Mother    Hypertension Mother    Glaucoma Mother    Prostate cancer Father    Diabetes Maternal Grandmother    Diabetes Maternal Grandfather    Prostate cancer Maternal Uncle     Allergies  Allergen Reactions   Glimepiride Other (See Comments)    Severe hypoglycemia   Other    Percocet [Oxycodone-Acetaminophen] Nausea And Vomiting       Latest Ref Rng & Units 10/15/2021   12:00 AM 05/03/2021   10:00 AM 03/07/2020   10:56 AM  CBC  WBC  8.7     7.6    Hemoglobin 13.5 - 17.5 14.9     13.5  8.2   Hematocrit 41 - 53 44     38.7  24.0   Platelets 150 - 400 K/uL 274     284       This result is from an external source.      CMP     Component Value Date/Time   NA 135 (A) 10/15/2021 0000   NA 143 02/10/2012 1850   K 5.6 (A)  10/15/2021 0000   K 3.8 02/10/2012 1850   CL 94 (A) 10/15/2021 0000   CL 105 02/10/2012 1850   CO2 22 10/15/2021 0000   CO2 30 02/10/2012 1850   GLUCOSE 108 (H) 05/07/2021 1750   GLUCOSE 127 (H) 02/10/2012 1850   BUN 69 (A) 10/15/2021 0000   BUN  11 02/10/2012 1850   CREATININE 12.4 (A) 10/15/2021 0000   CREATININE 12.22 (H) 05/07/2021 1750   CREATININE 1.17 02/10/2012 1850   CALCIUM 9.5 05/07/2021 1750   CALCIUM 8.8 02/10/2012 1850   PROT 7.1 11/09/2019 0036   PROT 7.2 08/21/2019 0925   ALBUMIN 3.8 05/07/2021 1750   ALBUMIN 3.3 (L) 08/21/2019 0925   AST 16 10/15/2021 0000   ALT 18 10/15/2021 0000   ALKPHOS 67 10/15/2021 0000   BILITOT 0.6 11/09/2019 0036   BILITOT 0.6 08/21/2019 0925   GFRNONAA 5 (L) 05/07/2021 1750   GFRNONAA >60 02/10/2012 1850   GFRAA 9 (L) 03/06/2020 1001   GFRAA >60 02/10/2012 1850     No results found.     Assessment & Plan:   1. ESRD (end stage renal disease) ([place]) Recommend:  The patient is doing well and currently has adequate dialysis access. The patient's dialysis center is not reporting any access issues. Flow pattern is stable when compared to the prior ultrasound.  The patient should have a duplex ultrasound of the dialysis access in 6 months. The patient will follow-up with me in the office after each ultrasound    2. Essential (primary) hypertension Continue antihypertensive medications as already ordered, these medications have been reviewed and there are no changes at this time.  3. Hyperlipidemia, mixed Continue statin as ordered and reviewed, no changes at this time   Current Outpatient Medications on File Prior to Visit  Medication Sig Dispense Refill   aspirin EC 81 MG tablet Take 1 tablet (81 mg total) by mouth daily. 150 tablet 2   atorvastatin (LIPITOR) 10 MG tablet Take 1 tablet (10 mg total) by mouth daily. 90 tablet 3   carvedilol (COREG) 6.25 MG tablet Take 6.25 mg by mouth 2 (two) times daily with a meal.       cinacalcet (SENSIPAR) 30 MG tablet Take 30 mg by mouth daily.     clopidogrel (PLAVIX) 75 MG tablet TAKE 1 TABLET(75 MG) BY MOUTH DAILY 30 tablet 11   furosemide (LASIX) 80 MG tablet Take 80 mg by mouth daily.     lanthanum (FOSRENOL) 1000 MG chewable tablet Chew 1,000 mg by mouth 3 (three) times daily.     mometasone (ELOCON) 0.1 % cream Apply topically daily.     amLODipine (NORVASC) 10 MG tablet Take 1 tablet (10 mg total) by mouth daily. 90 tablet 3   calcitRIOL (ROCALTROL) 0.25 MCG capsule Take 1 capsule (0.25 mcg total) by mouth daily. 90 capsule 3   No current facility-administered medications on file prior to visit.    There are no Patient Instructions on file for this visit. No follow-ups on file.   Kris Hartmann, NP

## 2022-10-11 ENCOUNTER — Other Ambulatory Visit (INDEPENDENT_AMBULATORY_CARE_PROVIDER_SITE_OTHER): Payer: Self-pay | Admitting: Vascular Surgery

## 2022-11-10 DIAGNOSIS — N2581 Secondary hyperparathyroidism of renal origin: Secondary | ICD-10-CM | POA: Diagnosis not present

## 2022-11-10 DIAGNOSIS — Z992 Dependence on renal dialysis: Secondary | ICD-10-CM | POA: Diagnosis not present

## 2022-11-10 DIAGNOSIS — N186 End stage renal disease: Secondary | ICD-10-CM | POA: Diagnosis not present

## 2022-11-12 DIAGNOSIS — N186 End stage renal disease: Secondary | ICD-10-CM | POA: Diagnosis not present

## 2022-11-12 DIAGNOSIS — Z992 Dependence on renal dialysis: Secondary | ICD-10-CM | POA: Diagnosis not present

## 2022-11-12 DIAGNOSIS — N2581 Secondary hyperparathyroidism of renal origin: Secondary | ICD-10-CM | POA: Diagnosis not present

## 2022-11-14 DIAGNOSIS — N2581 Secondary hyperparathyroidism of renal origin: Secondary | ICD-10-CM | POA: Diagnosis not present

## 2022-11-14 DIAGNOSIS — Z992 Dependence on renal dialysis: Secondary | ICD-10-CM | POA: Diagnosis not present

## 2022-11-14 DIAGNOSIS — N186 End stage renal disease: Secondary | ICD-10-CM | POA: Diagnosis not present

## 2022-11-17 DIAGNOSIS — N2581 Secondary hyperparathyroidism of renal origin: Secondary | ICD-10-CM | POA: Diagnosis not present

## 2022-11-17 DIAGNOSIS — N186 End stage renal disease: Secondary | ICD-10-CM | POA: Diagnosis not present

## 2022-11-17 DIAGNOSIS — Z992 Dependence on renal dialysis: Secondary | ICD-10-CM | POA: Diagnosis not present

## 2022-11-20 DIAGNOSIS — N186 End stage renal disease: Secondary | ICD-10-CM | POA: Diagnosis not present

## 2022-11-20 DIAGNOSIS — Z992 Dependence on renal dialysis: Secondary | ICD-10-CM | POA: Diagnosis not present

## 2022-11-20 DIAGNOSIS — N2581 Secondary hyperparathyroidism of renal origin: Secondary | ICD-10-CM | POA: Diagnosis not present

## 2022-11-21 DIAGNOSIS — N186 End stage renal disease: Secondary | ICD-10-CM | POA: Diagnosis not present

## 2022-11-21 DIAGNOSIS — N2581 Secondary hyperparathyroidism of renal origin: Secondary | ICD-10-CM | POA: Diagnosis not present

## 2022-11-21 DIAGNOSIS — Z992 Dependence on renal dialysis: Secondary | ICD-10-CM | POA: Diagnosis not present

## 2022-11-24 DIAGNOSIS — N2581 Secondary hyperparathyroidism of renal origin: Secondary | ICD-10-CM | POA: Diagnosis not present

## 2022-11-24 DIAGNOSIS — N186 End stage renal disease: Secondary | ICD-10-CM | POA: Diagnosis not present

## 2022-11-24 DIAGNOSIS — Z992 Dependence on renal dialysis: Secondary | ICD-10-CM | POA: Diagnosis not present

## 2022-11-26 DIAGNOSIS — Z992 Dependence on renal dialysis: Secondary | ICD-10-CM | POA: Diagnosis not present

## 2022-11-26 DIAGNOSIS — N186 End stage renal disease: Secondary | ICD-10-CM | POA: Diagnosis not present

## 2022-11-26 DIAGNOSIS — N2581 Secondary hyperparathyroidism of renal origin: Secondary | ICD-10-CM | POA: Diagnosis not present

## 2022-11-28 DIAGNOSIS — N2581 Secondary hyperparathyroidism of renal origin: Secondary | ICD-10-CM | POA: Diagnosis not present

## 2022-11-28 DIAGNOSIS — Z992 Dependence on renal dialysis: Secondary | ICD-10-CM | POA: Diagnosis not present

## 2022-11-28 DIAGNOSIS — N186 End stage renal disease: Secondary | ICD-10-CM | POA: Diagnosis not present

## 2022-11-30 ENCOUNTER — Ambulatory Visit (INDEPENDENT_AMBULATORY_CARE_PROVIDER_SITE_OTHER): Payer: Medicare HMO | Admitting: Internal Medicine

## 2022-11-30 ENCOUNTER — Encounter: Payer: Self-pay | Admitting: Internal Medicine

## 2022-11-30 ENCOUNTER — Ambulatory Visit (INDEPENDENT_AMBULATORY_CARE_PROVIDER_SITE_OTHER): Payer: Medicare HMO

## 2022-11-30 VITALS — Ht 71.0 in | Wt 282.3 lb

## 2022-11-30 VITALS — BP 128/74 | HR 77 | Ht 71.0 in | Wt 282.8 lb

## 2022-11-30 DIAGNOSIS — Z0000 Encounter for general adult medical examination without abnormal findings: Secondary | ICD-10-CM | POA: Diagnosis not present

## 2022-11-30 DIAGNOSIS — I10 Essential (primary) hypertension: Secondary | ICD-10-CM

## 2022-11-30 DIAGNOSIS — N2581 Secondary hyperparathyroidism of renal origin: Secondary | ICD-10-CM

## 2022-11-30 DIAGNOSIS — N186 End stage renal disease: Secondary | ICD-10-CM | POA: Diagnosis not present

## 2022-11-30 DIAGNOSIS — Z131 Encounter for screening for diabetes mellitus: Secondary | ICD-10-CM | POA: Diagnosis not present

## 2022-11-30 DIAGNOSIS — C61 Malignant neoplasm of prostate: Secondary | ICD-10-CM | POA: Diagnosis not present

## 2022-11-30 DIAGNOSIS — E782 Mixed hyperlipidemia: Secondary | ICD-10-CM | POA: Diagnosis not present

## 2022-11-30 MED ORDER — ATORVASTATIN CALCIUM 10 MG PO TABS: 10.0000 mg | ORAL_TABLET | Freq: Every day | ORAL | 3 refills | Status: DC

## 2022-11-30 NOTE — Progress Notes (Signed)
Subjective:   Benjamin Stewart is a 47 y.o. male who presents for an Initial Medicare Annual Wellness Visit.  I connected with  Benjamin Stewart on 11/30/22 by an in person visit and verified that I am speaking with the correct person using two identifiers.  Patient Location: Other:  Chiropractor Location: Office/Clinic   Review of Systems    Defer to PCP  Cardiac Risk Factors include: advanced age (>1men, >75 women);obesity (BMI >30kg/m2)     Objective:    Today's Vitals   11/30/22 0918 11/30/22 0919  Weight: 282 lb 4.8 oz (128.1 kg)   Height:  (1.803 m)   PainSc:  0-No pain   Body mass index is 39.37 kg/m.     11/30/2022    9:20 AM 10/31/2020    9:30 AM 05/30/2020    8:14 AM 04/26/2020    9:37 AM 04/18/2020   11:44 AM 03/06/2020    2:04 PM 01/17/2020    7:06 AM  Advanced Directives  Does Patient Have a Medical Advance Directive? No No No No No No No  Would patient like information on creating a medical advance directive? No - Patient declined No - Patient declined No - Patient declined No - Patient declined   No - Patient declined    Current Medications (verified) Outpatient Encounter Medications as of 11/30/2022  Medication Sig   ASPIRIN LOW DOSE 81 MG tablet TAKE 1 TABLET BY MOUTH DAILY   atorvastatin (LIPITOR) 10 MG tablet Take 1 tablet (10 mg total) by mouth daily.   carvedilol (COREG) 6.25 MG tablet Take 6.25 mg by mouth 2 (two) times daily with a meal.    cinacalcet (SENSIPAR) 30 MG tablet Take 30 mg by mouth daily.   clopidogrel (PLAVIX) 75 MG tablet TAKE 1 TABLET(75 MG) BY MOUTH DAILY   furosemide (LASIX) 80 MG tablet Take 80 mg by mouth daily.   mometasone (ELOCON) 0.1 % cream Apply topically daily.   No facility-administered encounter medications on file as of 11/30/2022.    Allergies (verified) Glimepiride, Other, and Percocet [oxycodone-acetaminophen]   History: Past Medical History:  Diagnosis Date   Anemia    Benign essential  hematuria 12/04/2014   microscopic hematuria evaluation with CT and cysto - treated for 2 months with bactrim DS; no further f/u planned unless hematuria recurs.    Chronic kidney disease    Controlled type 2 diabetes mellitus without complication, without long-term current use of insulin 07/16/2015   COVID-19 virus infection 12/29/2019   Essential (primary) hypertension 12/04/2014   History of kidney stones    H/O   Malignant essential hypertension 12/04/2014   prostate cancer    PROSTATE    Sleep apnea    DOES NOT USE CPAP   Past Surgical History:  Procedure Laterality Date   A/V FISTULAGRAM Left 05/30/2020   Procedure: A/V FISTULAGRAM;  Surgeon: Annice Needy, MD;  Location: [facility];  Service: Cardiovascular;  Laterality: Left;   AV FISTULA PLACEMENT Left 03/07/2020   Procedure: ARTERIOVENOUS (AV) FISTULA CREATION BRACHIAL CEPHALIC;  Surgeon: Annice Needy, MD;  Location: [facility];  Service: Vascular;  Laterality: Left;   DIALYSIS/PERMA CATHETER INSERTION N/A 01/17/2020   Procedure: DIALYSIS/PERMA CATHETER INSERTION;  Surgeon: Annice Needy, MD;  Location: [facility];  Service: Cardiovascular;  Laterality: N/A;   DIALYSIS/PERMA CATHETER REMOVAL N/A 10/07/2020   Procedure: DIALYSIS/PERMA CATHETER REMOVAL;  Surgeon: Annice Needy, MD;  Location: [facility];  Service: Cardiovascular;  Laterality: N/A;   PARTIAL COLECTOMY  2005   RADIOACTIVE SEED IMPLANT N/A 12/06/2017   Procedure: RADIOACTIVE SEED IMPLANT/BRACHYTHERAPY IMPLANT;  Surgeon: Vanna Scotland, MD;  Location: [facility];  Service: Urology;  Laterality: N/A;   UPPER EXTREMITY ANGIOGRAPHY Left 04/18/2020   Procedure: UPPER EXTREMITY ANGIOGRAPHY;  Surgeon: Annice Needy, MD;  Location: [facility];  Service: Cardiovascular;  Laterality: Left;   Family History  Problem Relation Age of Onset   Diabetes Mother    Heart disease Mother    Hypertension Mother    Glaucoma Mother    Prostate cancer Father     Diabetes Maternal Grandmother    Diabetes Maternal Grandfather    Prostate cancer Maternal Uncle    Social History   Socioeconomic History   Marital status: Divorced    Spouse name: Not on file   Number of children: Not on file   Years of education: Not on file   Highest education level: Not on file  Occupational History   Not on file  Tobacco Use   Smoking status: Never   Smokeless tobacco: Never  Vaping Use   Vaping Use: Never used  Substance and Sexual Activity   Alcohol use: No    Alcohol/week: 0.0 standard drinks of alcohol   Drug use: No   Sexual activity: Yes  Other Topics Concern   Not on file  Social History Narrative   Not on file   Social Determinants of Health   Financial Resource Strain: Low Risk  (11/30/2022)   Overall Financial Resource Strain (CARDIA)    Difficulty of Paying Living Expenses: Not hard at all  Food Insecurity: No Food Insecurity (11/30/2022)   Hunger Vital Sign    Worried About Running Out of Food in the Last Year: Never true    Ran Out of Food in the Last Year: Never true  Transportation Needs: No Transportation Needs (11/30/2022)   PRAPARE - Administrator, Civil Service (Medical): No    Lack of Transportation (Non-Medical): No  Physical Activity: Inactive (11/30/2022)   Exercise Vital Sign    Days of Exercise per Week: 0 days    Minutes of Exercise per Session: 0 min  Stress: No Stress Concern Present (11/30/2022)   [employer] of Occupational Health - Occupational Stress Questionnaire    Feeling of Stress : Only a little  Social Connections: Not on file    Tobacco Counseling Counseling given: Not Answered   Clinical Intake:  Pre-visit preparation completed: Yes  Pain : No/denies pain Pain Score: 0-No pain     BMI - recorded: 39.97 Nutritional Status: BMI > 30  Obese Nutritional Risks: None Diabetes: No  How often do you need to have someone help you when you read instructions, pamphlets, or other  written materials from your doctor or pharmacy?: 1 - Never  Diabetic? No.  Interpreter Needed?: No  Information entered by :: Margaretha Sheffield, CMA   Activities of Daily Living    11/30/2022    9:20 AM  In your present state of health, do you have any difficulty performing the following activities:  Hearing? 0  Vision? 0  Difficulty concentrating or making decisions? 0  Walking or climbing stairs? 0  Dressing or bathing? 0  Doing errands, shopping? 0  Preparing Food and eating ? N  Using the Toilet? N  In the past six months, have you accidently leaked urine? N  Do you have problems with loss  of bowel control? N  Managing your Medications? N  Managing your Finances? N  Housekeeping or managing your Housekeeping? N    Patient Care Team: Reubin Milan, MD as PCP - General (Internal Medicine) Hildred Laser, MD (Inactive) as Consulting Physician (Urology) Camillo Flaming, MD as Referring Physician (Nephrology)  Indicate any recent Medical Services you may have received from other than [facility] providers in the past year (date may be approximate).     Assessment:   This is a routine wellness examination for [employer].  Hearing/Vision screen Hearing Screening - Comments:: No Concerns. Vision Screening - Comments:: No concerns.  Dietary issues and exercise activities discussed: Current Exercise Habits: The patient does not participate in regular exercise at present, Exercise limited by: None identified   Goals Addressed   None    Depression Screen    11/30/2022    9:12 AM 11/26/2021    8:45 AM 05/23/2021    1:55 PM 11/20/2020    1:33 PM 05/23/2020   11:07 AM 01/09/2020    3:07 PM 11/27/2019    2:52 PM  PHQ 2/9 Scores  PHQ - 2 Score 0 0 0 0 0 0 0  PHQ- 9 Score 0 0 0 0 0 0 2    Fall Risk    11/30/2022    9:12 AM 11/26/2021    8:46 AM 05/23/2021    1:55 PM 11/20/2020    1:25 PM 05/23/2020   11:07 AM  Fall Risk   Falls in the past year? 0 0 0 0 0  Number falls in  past yr: 0 0 0 0 0  Injury with Fall? 0 0 0 0 0  Risk for fall due to : No Fall Risks No Fall Risks No Fall Risks No Fall Risks No Fall Risks  Follow up Falls evaluation completed Falls evaluation completed Falls evaluation completed Falls evaluation completed Falls evaluation completed    FALL RISK PREVENTION PERTAINING TO THE HOME:  Any stairs in or around the home? No  If so, are there any without handrails? No  Home free of loose throw rugs in walkways, pet beds, electrical cords, etc? Yes  Adequate lighting in your home to reduce risk of falls? Yes   ASSISTIVE DEVICES UTILIZED TO PREVENT FALLS:  Life alert? No  Use of a cane, walker or w/c? No  Grab bars in the bathroom? No  Shower chair or bench in shower? No  Elevated toilet seat or a handicapped toilet? No   TIMED UP AND GO:  Was the test performed? Yes .  Gait steady and fast without use of assistive device  Immunizations Immunization History  Administered Date(s) Administered   Hepatitis B, ADULT 03/08/2020, 04/08/2020, 05/06/2020, 01/20/2021, 02/17/2021, 03/25/2021, 06/28/2021, 07/31/2021, 08/28/2021, 09/27/2021   Influenza,inj,Quad PF,6+ Mos 05/22/2014, 08/18/2016, 08/19/2017, 07/25/2018, 05/17/2019, 05/31/2020, 04/10/2021   Influenza-Unspecified 05/22/2014, 06/14/2015, 08/18/2016, 08/19/2017, 07/25/2018, 05/17/2019, 01/22/2020, 05/31/2020   Moderna SARS-COV2 Booster Vaccination 09/20/2020   PFIZER Comirnaty(Gray Top)Covid-19 Tri-Sucrose Vaccine 11/02/2019, 11/23/2019   PFIZER(Purple Top)SARS-COV-2 Vaccination 11/02/2019, 11/23/2019   PPD Test 03/11/2020, 03/10/2021, 06/17/2021   Pneumococcal Conjugate-13 07/17/2020   Pneumococcal Polysaccharide-23 07/25/2018   Pneumococcal-Unspecified 06/14/2015, 07/17/2020   Tdap 06/14/2015    TDAP status: Up to date  Flu Vaccine status: Declined, Education has been provided regarding the importance of this vaccine but patient still declined. Advised may receive this vaccine  at local pharmacy or Health Dept. Aware to provide a copy of the vaccination record if obtained from local pharmacy or Health  Dept. Verbalized acceptance and understanding.  Pneumococcal vaccine status: Up to date  Covid-19 vaccine status: Completed vaccines  Qualifies for Shingles Vaccine? No   Zostavax completed No   Shingrix Completed?: No.    Education has been provided regarding the importance of this vaccine. Patient has been advised to call insurance company to determine out of pocket expense if they have not yet received this vaccine. Advised may also receive vaccine at local pharmacy or Health Dept. Verbalized acceptance and understanding.  Screening Tests Health Maintenance  Topic Date Due   COVID-19 Vaccine (5 - 2023-24 season) 04/10/2022   INFLUENZA VACCINE  03/11/2023   Pneumococcal Vaccine 11-97 Years old (3 of 3 - PPSV23 or PCV20) 07/26/2023   Medicare Annual Wellness (AWV)  11/30/2023   DTaP/Tdap/Td (2 - Td or Tdap) 06/13/2025   COLONOSCOPY (Pts 45-16yrs Insurance coverage will need to be confirmed)  10/04/2027   Hepatitis C Screening  Completed   HIV Screening  Completed   HPV VACCINES  Aged Out    Health Maintenance  Health Maintenance Due  Topic Date Due   COVID-19 Vaccine (5 - 2023-24 season) 04/10/2022    Colorectal cancer screening: Type of screening: Colonoscopy. Completed 10/03/2020. Repeat every 7 years  Lung Cancer Screening: (Low Dose CT Chest recommended if Age 46-80 years, 30 pack-year currently smoking OR have quit w/in 15years.) does not qualify.   Additional Screening:  Hepatitis C Screening: does qualify; Completed 09/05/2020  Vision Screening: Recommended annual ophthalmology exams for early detection of glaucoma and other disorders of the eye. Is the patient up to date with their annual eye exam?  No   Dental Screening: Recommended annual dental exams for proper oral hygiene  Community Resource Referral / Chronic Care Management: CRR  required this visit?  No   CCM required this visit?  No      Plan:     I have personally reviewed and noted the following in the patient's chart:   Medical and social history Use of alcohol, tobacco or illicit drugs  Current medications and supplements including opioid prescriptions. Patient is not currently taking opioid prescriptions. Functional ability and status Nutritional status Physical activity Advanced directives List of other physicians Hospitalizations, surgeries, and ER visits in previous 12 months Vitals Screenings to include cognitive, depression, and falls Referrals and appointments  In addition, I have reviewed and discussed with patient certain preventive protocols, quality metrics, and best practice recommendations. A written personalized care plan for preventive services as well as general preventive health recommendations were provided to patient.     Mariel Sleet, CMA   11/30/2022   Nurse Notes: None.

## 2022-11-30 NOTE — Assessment & Plan Note (Signed)
Tolerating statin medications without concerns LDL is  Lab Results  Component Value Date   LDLCALC 64 11/26/2021   with a goal of < 70. Current dose will be adjusted if needed.

## 2022-11-30 NOTE — Progress Notes (Signed)
Date:  11/30/2022   Name:  Benjamin Stewart   DOB:  07-Nov-1975   MRN:  [id number]   Chief Complaint: Annual Exam Benjamin Stewart is a 47 y.o. male who presents today for his Complete Annual Exam. He feels well. He reports exercising - none. He reports he is sleeping well. He is going to [facility] and caring for his mother with dementia.  Colonoscopy: 11/2020 repeat 7 yrs  Immunization History  Administered Date(s) Administered   Hepatitis B, ADULT 03/08/2020, 04/08/2020, 05/06/2020, 01/20/2021, 02/17/2021, 03/25/2021, 06/28/2021, 07/31/2021, 08/28/2021, 09/27/2021   Influenza,inj,Quad PF,6+ Mos 05/22/2014, 08/18/2016, 08/19/2017, 07/25/2018, 05/17/2019, 05/31/2020, 04/10/2021   Influenza-Unspecified 05/22/2014, 06/14/2015, 08/18/2016, 08/19/2017, 07/25/2018, 05/17/2019, 01/22/2020, 05/31/2020   Moderna SARS-COV2 Booster Vaccination 09/20/2020   PFIZER Comirnaty(Gray Top)Covid-19 Tri-Sucrose Vaccine 11/02/2019, 11/23/2019   PFIZER(Purple Top)SARS-COV-2 Vaccination 11/02/2019, 11/23/2019   PPD Test 03/11/2020, 03/10/2021, 06/17/2021   Pneumococcal Conjugate-13 07/17/2020   Pneumococcal Polysaccharide-23 07/25/2018   Pneumococcal-Unspecified 06/14/2015, 07/17/2020   Tdap 06/14/2015   Health Maintenance Due  Topic Date Due   COVID-19 Vaccine (5 - 2023-24 season) 04/10/2022    Lab Results  Component Value Date   PSA1 5.7 (H) 09/03/2020   PSA1 3.8 05/07/2020   PSA1 2.3 04/27/2019   PSA 11 06/15/2022     Hypertension This is a chronic problem. The problem is controlled. Pertinent negatives include no chest pain, headaches, palpitations or shortness of breath.  Hyperlipidemia This is a chronic problem. The problem is controlled. Pertinent negatives include no chest pain, myalgias or shortness of breath. Current antihyperlipidemic treatment includes statins.    Lab Results  Component Value Date   NA 135 (A) 10/15/2021   K 5.6 (A) 10/15/2021   CO2 22 10/15/2021   GLUCOSE 108  (H) 05/07/2021   BUN 69 (A) 10/15/2021   CREATININE 12.4 (A) 10/15/2021   CALCIUM 9.5 05/07/2021   GFRNONAA 5 (L) 05/07/2021   Lab Results  Component Value Date   CHOL 117 11/26/2021   HDL 36 (L) 11/26/2021   LDLCALC 64 11/26/2021   TRIG 85 11/26/2021   CHOLHDL 3.3 11/26/2021   Lab Results  Component Value Date   TSH 1.173 11/09/2019   Lab Results  Component Value Date   HGBA1C 5.4 11/26/2021   Lab Results  Component Value Date   WBC 8.7 10/15/2021   HGB 14.9 10/15/2021   HCT 44 10/15/2021   MCV 95.1 05/03/2021   PLT 274 10/15/2021   Lab Results  Component Value Date   ALT 18 10/15/2021   AST 16 10/15/2021   ALKPHOS 67 10/15/2021   BILITOT 0.6 11/09/2019   No results found for: "25OHVITD2", "25OHVITD3", "VD25OH"   Review of Systems  Constitutional:  Negative for appetite change, chills, diaphoresis, fatigue and unexpected weight change.  HENT:  Negative for hearing loss, tinnitus, trouble swallowing and voice change.   Eyes:  Negative for visual disturbance.  Respiratory:  Negative for choking, shortness of breath and wheezing.   Cardiovascular:  Negative for chest pain, palpitations and leg swelling.  Gastrointestinal:  Negative for abdominal pain, blood in stool, constipation and diarrhea.  Musculoskeletal:  Negative for arthralgias, back pain and myalgias.  Skin:  Negative for color change and rash.  Neurological:  Negative for dizziness, syncope and headaches.  Hematological:  Negative for adenopathy.  Psychiatric/Behavioral:  Negative for dysphoric mood and sleep disturbance. The patient is not nervous/anxious.     Patient Active Problem List   Diagnosis Date Noted   End stage  renal disease 04/11/2020   Secondary hyperparathyroidism of renal origin 04/24/2019   Hyperkalemia 04/24/2019   Localized edema 01/19/2019   Chronic gout of multiple sites 08/16/2018   Prostate cancer 08/23/2015   Hyperlipidemia, mixed 07/16/2015   Essential (primary)  hypertension 12/04/2014    Allergies  Allergen Reactions   Glimepiride Other (See Comments)    Severe hypoglycemia   Other    Percocet [Oxycodone-Acetaminophen] Nausea And Vomiting    Past Surgical History:  Procedure Laterality Date   A/V FISTULAGRAM Left 05/30/2020   Procedure: A/V FISTULAGRAM;  Surgeon: Annice Needy, MD;  Location: [facility];  Service: Cardiovascular;  Laterality: Left;   AV FISTULA PLACEMENT Left 03/07/2020   Procedure: ARTERIOVENOUS (AV) FISTULA CREATION BRACHIAL CEPHALIC;  Surgeon: Annice Needy, MD;  Location: [facility];  Service: Vascular;  Laterality: Left;   DIALYSIS/PERMA CATHETER INSERTION N/A 01/17/2020   Procedure: DIALYSIS/PERMA CATHETER INSERTION;  Surgeon: Annice Needy, MD;  Location: [facility];  Service: Cardiovascular;  Laterality: N/A;   DIALYSIS/PERMA CATHETER REMOVAL N/A 10/07/2020   Procedure: DIALYSIS/PERMA CATHETER REMOVAL;  Surgeon: Annice Needy, MD;  Location: [facility];  Service: Cardiovascular;  Laterality: N/A;   PARTIAL COLECTOMY  2005   RADIOACTIVE SEED IMPLANT N/A 12/06/2017   Procedure: RADIOACTIVE SEED IMPLANT/BRACHYTHERAPY IMPLANT;  Surgeon: Vanna Scotland, MD;  Location: [facility];  Service: Urology;  Laterality: N/A;   UPPER EXTREMITY ANGIOGRAPHY Left 04/18/2020   Procedure: UPPER EXTREMITY ANGIOGRAPHY;  Surgeon: Annice Needy, MD;  Location: [facility];  Service: Cardiovascular;  Laterality: Left;    Social History   Tobacco Use   Smoking status: Never   Smokeless tobacco: Never  Vaping Use   Vaping Use: Never used  Substance Use Topics   Alcohol use: No    Alcohol/week: 0.0 standard drinks of alcohol   Drug use: No     Medication list has been reviewed and updated.  Current Meds  Medication Sig   ASPIRIN LOW DOSE 81 MG tablet TAKE 1 TABLET BY MOUTH DAILY   carvedilol (COREG) 6.25 MG tablet Take 6.25 mg by mouth 2 (two) times daily with a meal.    cinacalcet (SENSIPAR) 30 MG tablet  Take 30 mg by mouth daily.   clopidogrel (PLAVIX) 75 MG tablet TAKE 1 TABLET(75 MG) BY MOUTH DAILY   furosemide (LASIX) 80 MG tablet Take 80 mg by mouth daily.   mometasone (ELOCON) 0.1 % cream Apply topically daily.   [DISCONTINUED] atorvastatin (LIPITOR) 10 MG tablet Take 1 tablet (10 mg total) by mouth daily.       11/30/2022    9:12 AM 11/26/2021    8:46 AM 05/23/2021    1:55 PM 11/20/2020    1:34 PM  GAD 7 : Generalized Anxiety Score  Nervous, Anxious, on Edge 0 0 0 0  Control/stop worrying 0 0 0 0  Worry too much - different things 0 0 0 0  Trouble relaxing 0 0 0 0  Restless 0 0 0 0  Easily annoyed or irritable 0 0 0 0  Afraid - awful might happen 0 0 0 0  Total GAD 7 Score 0 0 0 0  Anxiety Difficulty Not difficult at all Not difficult at all Not difficult at all        11/30/2022    9:12 AM 11/26/2021    8:45 AM 05/23/2021    1:55 PM  Depression screen PHQ 2/9  Decreased Interest 0 0 0  Down, Depressed, Hopeless 0 0 0  PHQ - 2 Score 0 0 0  Altered sleeping 0 0 0  Tired, decreased energy 0 0 0  Change in appetite 0 0 0  Feeling bad or failure about yourself  0 0 0  Trouble concentrating 0 0 0  Moving slowly or fidgety/restless 0 0 0  Suicidal thoughts 0 0 0  PHQ-9 Score 0 0 0  Difficult doing work/chores Not difficult at all Not difficult at all Not difficult at all    BP Readings from Last 3 Encounters:  11/30/22 128/74  06/23/22 116/81  11/26/21 128/70    Physical Exam Vitals and nursing note reviewed.  Constitutional:      Appearance: Normal appearance. He is well-developed.  HENT:     Head: Normocephalic.     Right Ear: Tympanic membrane, ear canal and external ear normal.     Left Ear: Tympanic membrane, ear canal and external ear normal.     Nose: Nose normal.  Eyes:     Conjunctiva/sclera: Conjunctivae normal.     Pupils: Pupils are equal, round, and reactive to light.  Neck:     Thyroid: No thyromegaly.     Vascular: No carotid bruit.   Cardiovascular:     Rate and Rhythm: Normal rate and regular rhythm.     Heart sounds: Normal heart sounds.  Pulmonary:     Effort: Pulmonary effort is normal.     Breath sounds: Normal breath sounds. No wheezing.  Chest:  Breasts:    Right: No mass.     Left: No mass.  Abdominal:     General: Bowel sounds are normal.     Palpations: Abdomen is soft.     Tenderness: There is no abdominal tenderness.  Musculoskeletal:        General: Normal range of motion.     Cervical back: Normal range of motion and neck supple.  Lymphadenopathy:     Cervical: No cervical adenopathy.  Skin:    General: Skin is warm and dry.  Neurological:     Mental Status: He is alert and oriented to person, place, and time.     Deep Tendon Reflexes: Reflexes are normal and symmetric.  Psychiatric:        Attention and Perception: Attention normal.        Mood and Affect: Mood normal.        Thought Content: Thought content normal.     Wt Readings from Last 3 Encounters:  11/30/22 282 lb 12.8 oz (128.3 kg)  06/23/22 278 lb 3.2 oz (126.2 kg)  11/26/21 264 lb (119.7 kg)    BP 128/74   Pulse 77   Ht 5\' 11"  (1.803 m)   Wt 282 lb 12.8 oz (128.3 kg)   SpO2 99%   BMI 39.44 kg/m   Assessment and Plan:  Problem List Items Addressed This Visit       Cardiovascular and Mediastinum   Essential (primary) hypertension (Chronic)    Clinically stable exam with well controlled BP on coreg and lasix. Tolerating medications without side effects. Tolerating HD 3 times per week. Pt to continue current regimen.       Relevant Medications   atorvastatin (LIPITOR) 10 MG tablet   Other Relevant Orders   Comprehensive metabolic panel   CBC with Differential/Platelet     Endocrine   Secondary hyperparathyroidism of renal origin (Chronic)     Genitourinary   End stage renal disease (Chronic)    On  HD and tolerating well.      Relevant Orders   Comprehensive metabolic panel   Prostate cancer  (Chronic)    Recommend he contact Oncology at [facility] for a follow up        Other   Hyperlipidemia, mixed (Chronic)    Tolerating statin medications without concerns LDL is  Lab Results  Component Value Date   LDLCALC 64 11/26/2021  with a goal of < 70. Current dose will be adjusted if needed.       Relevant Medications   atorvastatin (LIPITOR) 10 MG tablet   Other Relevant Orders   Lipid panel   Other Visit Diagnoses     Annual physical exam    -  Primary   screenings and immunizations are up to date   Relevant Orders   Lipid panel   Hemoglobin A1c   Comprehensive metabolic panel   CBC with Differential/Platelet   Screening for diabetes mellitus       Relevant Orders   Hemoglobin A1c       Return in about 6 months (around 06/01/2023) for HTN.   MAW completed today by CMA.  Partially dictated using Dragon software, any errors are not intentional.  Reubin Milan, MD [facility] [place], [place]

## 2022-11-30 NOTE — Assessment & Plan Note (Signed)
On HD and tolerating well.

## 2022-11-30 NOTE — Assessment & Plan Note (Signed)
Recommend he contact Oncology at [facility] for a follow up

## 2022-11-30 NOTE — Assessment & Plan Note (Signed)
Clinically stable exam with well controlled BP on coreg and lasix. Tolerating medications without side effects. Tolerating HD 3 times per week. Pt to continue current regimen.

## 2022-12-01 DIAGNOSIS — Z992 Dependence on renal dialysis: Secondary | ICD-10-CM | POA: Diagnosis not present

## 2022-12-01 DIAGNOSIS — N2581 Secondary hyperparathyroidism of renal origin: Secondary | ICD-10-CM | POA: Diagnosis not present

## 2022-12-01 DIAGNOSIS — N186 End stage renal disease: Secondary | ICD-10-CM | POA: Diagnosis not present

## 2022-12-01 LAB — LIPID PANEL
Chol/HDL Ratio: 3.4 ratio (ref 0.0–5.0)
Cholesterol, Total: 118 mg/dL (ref 100–199)
HDL: 35 mg/dL — ABNORMAL LOW (ref >39)
LDL Chol Calc (NIH): 65 mg/dL (ref 0–99)
Triglycerides: 92 mg/dL (ref 0–149)
VLDL Cholesterol Cal: 18 mg/dL (ref 5–40)

## 2022-12-01 LAB — CBC WITH DIFFERENTIAL/PLATELET
Basophils Absolute: 0.1 10*3/uL (ref 0.0–0.2)
Basos: 1 %
EOS (ABSOLUTE): 0.4 10*3/uL (ref 0.0–0.4)
Eos: 4 %
Hematocrit: 47.7 % (ref 37.5–51.0)
Hemoglobin: 16.1 g/dL (ref 13.0–17.7)
Immature Grans (Abs): 0 10*3/uL (ref 0.0–0.1)
Immature Granulocytes: 0 %
Lymphocytes Absolute: 1.5 10*3/uL (ref 0.7–3.1)
Lymphs: 16 %
MCH: 30.6 pg (ref 26.6–33.0)
MCHC: 33.8 g/dL (ref 31.5–35.7)
MCV: 91 fL (ref 79–97)
Monocytes Absolute: 0.8 10*3/uL (ref 0.1–0.9)
Monocytes: 9 %
Neutrophils Absolute: 6.8 10*3/uL (ref 1.4–7.0)
Neutrophils: 70 %
Platelets: 309 10*3/uL (ref 150–450)
RBC: 5.27 x10E6/uL (ref 4.14–5.80)
RDW: 13.5 % (ref 11.6–15.4)
WBC: 9.6 10*3/uL (ref 3.4–10.8)

## 2022-12-01 LAB — COMPREHENSIVE METABOLIC PANEL
ALT: 7 IU/L (ref 0–44)
AST: 9 IU/L (ref 0–40)
Albumin/Globulin Ratio: 1.2 (ref 1.2–2.2)
Albumin: 4.2 g/dL (ref 4.1–5.1)
Alkaline Phosphatase: 80 IU/L (ref 44–121)
BUN/Creatinine Ratio: 5 — ABNORMAL LOW (ref 9–20)
BUN: 66 mg/dL — ABNORMAL HIGH (ref 6–24)
Bilirubin Total: 0.6 mg/dL (ref 0.0–1.2)
CO2: 18 mmol/L — ABNORMAL LOW (ref 20–29)
Calcium: 9.1 mg/dL (ref 8.7–10.2)
Chloride: 93 mmol/L — ABNORMAL LOW (ref 96–106)
Creatinine, Ser: 12.17 mg/dL — ABNORMAL HIGH (ref 0.76–1.27)
Globulin, Total: 3.5 g/dL (ref 1.5–4.5)
Glucose: 98 mg/dL (ref 70–99)
Potassium: 5.7 mmol/L — ABNORMAL HIGH (ref 3.5–5.2)
Sodium: 138 mmol/L (ref 134–144)
Total Protein: 7.7 g/dL (ref 6.0–8.5)
eGFR: 5 mL/min/{1.73_m2} — ABNORMAL LOW (ref >59)

## 2022-12-01 LAB — HEMOGLOBIN A1C
Est. average glucose Bld gHb Est-mCnc: 123 mg/dL
Hgb A1c MFr Bld: 5.9 % — ABNORMAL HIGH (ref 4.8–5.6)

## 2022-12-03 DIAGNOSIS — N2581 Secondary hyperparathyroidism of renal origin: Secondary | ICD-10-CM | POA: Diagnosis not present

## 2022-12-03 DIAGNOSIS — N186 End stage renal disease: Secondary | ICD-10-CM | POA: Diagnosis not present

## 2022-12-03 DIAGNOSIS — Z992 Dependence on renal dialysis: Secondary | ICD-10-CM | POA: Diagnosis not present

## 2022-12-05 DIAGNOSIS — N2581 Secondary hyperparathyroidism of renal origin: Secondary | ICD-10-CM | POA: Diagnosis not present

## 2022-12-05 DIAGNOSIS — Z992 Dependence on renal dialysis: Secondary | ICD-10-CM | POA: Diagnosis not present

## 2022-12-05 DIAGNOSIS — N186 End stage renal disease: Secondary | ICD-10-CM | POA: Diagnosis not present

## 2022-12-08 DIAGNOSIS — N186 End stage renal disease: Secondary | ICD-10-CM | POA: Diagnosis not present

## 2022-12-08 DIAGNOSIS — Z992 Dependence on renal dialysis: Secondary | ICD-10-CM | POA: Diagnosis not present

## 2022-12-08 DIAGNOSIS — N2581 Secondary hyperparathyroidism of renal origin: Secondary | ICD-10-CM | POA: Diagnosis not present

## 2022-12-10 DIAGNOSIS — Z992 Dependence on renal dialysis: Secondary | ICD-10-CM | POA: Diagnosis not present

## 2022-12-10 DIAGNOSIS — N2581 Secondary hyperparathyroidism of renal origin: Secondary | ICD-10-CM | POA: Diagnosis not present

## 2022-12-10 DIAGNOSIS — N186 End stage renal disease: Secondary | ICD-10-CM | POA: Diagnosis not present

## 2022-12-12 DIAGNOSIS — Z992 Dependence on renal dialysis: Secondary | ICD-10-CM | POA: Diagnosis not present

## 2022-12-12 DIAGNOSIS — N186 End stage renal disease: Secondary | ICD-10-CM | POA: Diagnosis not present

## 2022-12-12 DIAGNOSIS — N2581 Secondary hyperparathyroidism of renal origin: Secondary | ICD-10-CM | POA: Diagnosis not present

## 2022-12-15 DIAGNOSIS — N2581 Secondary hyperparathyroidism of renal origin: Secondary | ICD-10-CM | POA: Diagnosis not present

## 2022-12-15 DIAGNOSIS — Z992 Dependence on renal dialysis: Secondary | ICD-10-CM | POA: Diagnosis not present

## 2022-12-15 DIAGNOSIS — N186 End stage renal disease: Secondary | ICD-10-CM | POA: Diagnosis not present

## 2022-12-16 ENCOUNTER — Other Ambulatory Visit (INDEPENDENT_AMBULATORY_CARE_PROVIDER_SITE_OTHER): Payer: Self-pay | Admitting: Nurse Practitioner

## 2022-12-16 DIAGNOSIS — N186 End stage renal disease: Secondary | ICD-10-CM

## 2022-12-16 DIAGNOSIS — T829XXS Unspecified complication of cardiac and vascular prosthetic device, implant and graft, sequela: Secondary | ICD-10-CM

## 2022-12-17 DIAGNOSIS — N186 End stage renal disease: Secondary | ICD-10-CM | POA: Diagnosis not present

## 2022-12-17 DIAGNOSIS — Z992 Dependence on renal dialysis: Secondary | ICD-10-CM | POA: Diagnosis not present

## 2022-12-17 DIAGNOSIS — N2581 Secondary hyperparathyroidism of renal origin: Secondary | ICD-10-CM | POA: Diagnosis not present

## 2022-12-19 DIAGNOSIS — N2581 Secondary hyperparathyroidism of renal origin: Secondary | ICD-10-CM | POA: Diagnosis not present

## 2022-12-19 DIAGNOSIS — N186 End stage renal disease: Secondary | ICD-10-CM | POA: Diagnosis not present

## 2022-12-19 DIAGNOSIS — Z992 Dependence on renal dialysis: Secondary | ICD-10-CM | POA: Diagnosis not present

## 2022-12-21 ENCOUNTER — Ambulatory Visit (INDEPENDENT_AMBULATORY_CARE_PROVIDER_SITE_OTHER): Payer: Medicare HMO | Admitting: Nurse Practitioner

## 2022-12-21 ENCOUNTER — Ambulatory Visit (INDEPENDENT_AMBULATORY_CARE_PROVIDER_SITE_OTHER): Payer: Medicare HMO

## 2022-12-21 VITALS — BP 131/85 | HR 80 | Resp 16 | Ht 71.0 in | Wt 283.4 lb

## 2022-12-21 DIAGNOSIS — E782 Mixed hyperlipidemia: Secondary | ICD-10-CM

## 2022-12-21 DIAGNOSIS — T829XXS Unspecified complication of cardiac and vascular prosthetic device, implant and graft, sequela: Secondary | ICD-10-CM

## 2022-12-21 DIAGNOSIS — I10 Essential (primary) hypertension: Secondary | ICD-10-CM | POA: Diagnosis not present

## 2022-12-21 DIAGNOSIS — N186 End stage renal disease: Secondary | ICD-10-CM

## 2022-12-22 ENCOUNTER — Encounter (INDEPENDENT_AMBULATORY_CARE_PROVIDER_SITE_OTHER): Payer: Self-pay | Admitting: Nurse Practitioner

## 2022-12-22 DIAGNOSIS — N186 End stage renal disease: Secondary | ICD-10-CM | POA: Diagnosis not present

## 2022-12-22 DIAGNOSIS — Z992 Dependence on renal dialysis: Secondary | ICD-10-CM | POA: Diagnosis not present

## 2022-12-22 DIAGNOSIS — N2581 Secondary hyperparathyroidism of renal origin: Secondary | ICD-10-CM | POA: Diagnosis not present

## 2022-12-22 NOTE — Progress Notes (Signed)
Subjective:    Patient ID: Benjamin Stewart, male    DOB: 12/26/1975, 47 y.o.   MRN: [id number] Chief Complaint  Patient presents with   Follow-up    The patient returns to the office for follow up regarding a problem with their dialysis access.   The patient notes there has been a whistling in his fistula.  Otherwise he denies any other issues.   The patient denies hand pain or other symptoms consistent with steal phenomena.  No significant arm swelling.  The patient denies redness or swelling at the access site. The patient denies fever or chills at home or while on dialysis.  No recent shortening of the patient's walking distance or new symptoms consistent with claudication.  No history of rest pain symptoms. No new ulcers or wounds of the lower extremities have occurred.  The patient denies amaurosis fugax or recent TIA symptoms. There are no recent neurological changes noted. There is no history of DVT, PE or superficial thrombophlebitis. No recent episodes of angina or shortness of breath documented.   Duplex ultrasound of the AV access shows a patent access.  The previously noted stenosis is significantly increased compared to last study.  Flow volume today is 3088 cc/min (previous flow volume was 2422 cc/min).  The patient has fairly aneurysmal access.    Review of Systems  All other systems reviewed and are negative.      Objective:   Physical Exam Vitals reviewed.  HENT:     Head: Normocephalic.  Cardiovascular:     Rate and Rhythm: Normal rate.     Pulses:          Radial pulses are 2+ on the left side.     Arteriovenous access: Left arteriovenous access is present.    Comments: Good thrill and bruit but slight whistle Pulmonary:     Effort: Pulmonary effort is normal.  Skin:    General: Skin is warm and dry.  Neurological:     Mental Status: He is alert and oriented to person, place, and time.  Psychiatric:        Mood and Affect: Mood normal.         Behavior: Behavior normal.        Thought Content: Thought content normal.        Judgment: Judgment normal.     BP 131/85 (BP Location: Right Arm)   Pulse 80   Resp 16   Ht 5\' 11"  (1.803 m)   Wt 283 lb 6.4 oz (128.5 kg)   BMI 39.53 kg/m   Past Medical History:  Diagnosis Date   Anemia    Benign essential hematuria 12/04/2014   microscopic hematuria evaluation with CT and cysto - treated for 2 months with bactrim DS; no further f/u planned unless hematuria recurs.    Chronic kidney disease    Controlled type 2 diabetes mellitus without complication, without long-term current use of insulin (HCC) 07/16/2015   COVID-19 virus infection 12/29/2019   Essential (primary) hypertension 12/04/2014   History of kidney stones    H/O   Malignant essential hypertension 12/04/2014   prostate cancer    PROSTATE    Sleep apnea    DOES NOT USE CPAP    Social History   Socioeconomic History   Marital status: Divorced    Spouse name: Not on file   Number of children: Not on file   Years of education: Not on file   Highest education level: Not on file  Occupational History   Not on file  Tobacco Use   Smoking status: Never   Smokeless tobacco: Never  Vaping Use   Vaping Use: Never used  Substance and Sexual Activity   Alcohol use: No    Alcohol/week: 0.0 standard drinks of alcohol   Drug use: No   Sexual activity: Yes  Other Topics Concern   Not on file  Social History Narrative   Not on file   Social Determinants of Health   Financial Resource Strain: Low Risk  (11/30/2022)   Overall Financial Resource Strain (CARDIA)    Difficulty of Paying Living Expenses: Not hard at all  Food Insecurity: No Food Insecurity (11/30/2022)   Hunger Vital Sign    Worried About Running Out of Food in the Last Year: Never true    Ran Out of Food in the Last Year: Never true  Transportation Needs: No Transportation Needs (11/30/2022)   PRAPARE - Administrator, Civil Service (Medical):  No    Lack of Transportation (Non-Medical): No  Physical Activity: Inactive (11/30/2022)   Exercise Vital Sign    Days of Exercise per Week: 0 days    Minutes of Exercise per Session: 0 min  Stress: No Stress Concern Present (11/30/2022)   [employer] of Occupational Health - Occupational Stress Questionnaire    Feeling of Stress : Only a little  Social Connections: Not on file  Intimate Partner Violence: Not At Risk (11/30/2022)   Humiliation, Afraid, Rape, and Kick questionnaire    Fear of Current or Ex-Partner: No    Emotionally Abused: No    Physically Abused: No    Sexually Abused: No    Past Surgical History:  Procedure Laterality Date   A/V FISTULAGRAM Left 05/30/2020   Procedure: A/V FISTULAGRAM;  Surgeon: Annice Needy, MD;  Location: [facility];  Service: Cardiovascular;  Laterality: Left;   AV FISTULA PLACEMENT Left 03/07/2020   Procedure: ARTERIOVENOUS (AV) FISTULA CREATION BRACHIAL CEPHALIC;  Surgeon: Annice Needy, MD;  Location: [facility];  Service: Vascular;  Laterality: Left;   DIALYSIS/PERMA CATHETER INSERTION N/A 01/17/2020   Procedure: DIALYSIS/PERMA CATHETER INSERTION;  Surgeon: Annice Needy, MD;  Location: [facility];  Service: Cardiovascular;  Laterality: N/A;   DIALYSIS/PERMA CATHETER REMOVAL N/A 10/07/2020   Procedure: DIALYSIS/PERMA CATHETER REMOVAL;  Surgeon: Annice Needy, MD;  Location: [facility];  Service: Cardiovascular;  Laterality: N/A;   PARTIAL COLECTOMY  2005   RADIOACTIVE SEED IMPLANT N/A 12/06/2017   Procedure: RADIOACTIVE SEED IMPLANT/BRACHYTHERAPY IMPLANT;  Surgeon: Vanna Scotland, MD;  Location: [facility];  Service: Urology;  Laterality: N/A;   UPPER EXTREMITY ANGIOGRAPHY Left 04/18/2020   Procedure: UPPER EXTREMITY ANGIOGRAPHY;  Surgeon: Annice Needy, MD;  Location: [facility];  Service: Cardiovascular;  Laterality: Left;    Family History  Problem Relation Age of Onset   Diabetes Mother    Heart disease  Mother    Hypertension Mother    Glaucoma Mother    Prostate cancer Father    Diabetes Maternal Grandmother    Diabetes Maternal Grandfather    Prostate cancer Maternal Uncle     Allergies  Allergen Reactions   Glimepiride Other (See Comments)    Severe hypoglycemia   Other    Percocet [Oxycodone-Acetaminophen] Nausea And Vomiting       Latest Ref Rng & Units 11/30/2022   10:08 AM 10/15/2021   12:00 AM 05/03/2021   10:00 AM  CBC  WBC 3.4 - 10.8 x10E3/uL 9.6  8.7     7.6   Hemoglobin 13.0 - 17.7 g/dL 16.1  09.6     04.5   Hematocrit 37.5 - 51.0 % 47.7  44     38.7   Platelets 150 - 450 x10E3/uL 309  274     284      This result is from an external source.      CMP     Component Value Date/Time   NA 138 11/30/2022 1008   NA 143 02/10/2012 1850   K 5.7 (H) 11/30/2022 1008   K 3.8 02/10/2012 1850   CL 93 (L) 11/30/2022 1008   CL 105 02/10/2012 1850   CO2 18 (L) 11/30/2022 1008   CO2 30 02/10/2012 1850   GLUCOSE 98 11/30/2022 1008   GLUCOSE 108 (H) 05/07/2021 1750   GLUCOSE 127 (H) 02/10/2012 1850   BUN 66 (H) 11/30/2022 1008   BUN 11 02/10/2012 1850   CREATININE 12.17 (H) 11/30/2022 1008   CREATININE 1.17 02/10/2012 1850   CALCIUM 9.1 11/30/2022 1008   CALCIUM 8.8 02/10/2012 1850   PROT 7.7 11/30/2022 1008   ALBUMIN 4.2 11/30/2022 1008   AST 9 11/30/2022 1008   ALT 7 11/30/2022 1008   ALKPHOS 80 11/30/2022 1008   BILITOT 0.6 11/30/2022 1008   GFRNONAA 5 (L) 05/07/2021 1750   GFRNONAA >60 02/10/2012 1850   GFRAA 9 (L) 03/06/2020 1001   GFRAA >60 02/10/2012 1850     No results found.     Assessment & Plan:   1. ESRD (end stage renal disease) (HCC) The patient has a faint also in his bruit but this has been present for about a month at this point.  He denies that he is having any issues with dialysis currently.  His flow-volume loop remains very good and there is no evidence of significant stenosis.  Based on this we will maintain close follow-up and have  him return in 3 months.  He is advised that if he begins to experience any issues during dialysis that we will have him follow-up sooner and we can discuss intervention.  2. Essential (primary) hypertension Continue antihypertensive medications as already ordered, these medications have been reviewed and there are no changes at this time.  3. Hyperlipidemia, mixed Continue statin as ordered and reviewed, no changes at this time   Current Outpatient Medications on File Prior to Visit  Medication Sig Dispense Refill   ASPIRIN LOW DOSE 81 MG tablet TAKE 1 TABLET BY MOUTH DAILY 150 tablet 2   atorvastatin (LIPITOR) 10 MG tablet Take 1 tablet (10 mg total) by mouth daily. 90 tablet 3   carvedilol (COREG) 6.25 MG tablet Take 6.25 mg by mouth 2 (two) times daily with a meal.      cinacalcet (SENSIPAR) 30 MG tablet Take 30 mg by mouth daily.     clopidogrel (PLAVIX) 75 MG tablet TAKE 1 TABLET(75 MG) BY MOUTH DAILY 30 tablet 11   furosemide (LASIX) 80 MG tablet Take 80 mg by mouth daily.     mometasone (ELOCON) 0.1 % cream Apply topically daily.     XPHOZAH 30 MG TABS Take by mouth.     No current facility-administered medications on file prior to visit.    There are no Patient Instructions on file for this visit. No follow-ups on file.   Georgiana Spinner, NP

## 2022-12-25 DIAGNOSIS — Z992 Dependence on renal dialysis: Secondary | ICD-10-CM | POA: Diagnosis not present

## 2022-12-25 DIAGNOSIS — N186 End stage renal disease: Secondary | ICD-10-CM | POA: Diagnosis not present

## 2022-12-25 DIAGNOSIS — N2581 Secondary hyperparathyroidism of renal origin: Secondary | ICD-10-CM | POA: Diagnosis not present

## 2022-12-26 DIAGNOSIS — Z992 Dependence on renal dialysis: Secondary | ICD-10-CM | POA: Diagnosis not present

## 2022-12-26 DIAGNOSIS — N2581 Secondary hyperparathyroidism of renal origin: Secondary | ICD-10-CM | POA: Diagnosis not present

## 2022-12-26 DIAGNOSIS — N186 End stage renal disease: Secondary | ICD-10-CM | POA: Diagnosis not present

## 2022-12-29 DIAGNOSIS — N2581 Secondary hyperparathyroidism of renal origin: Secondary | ICD-10-CM | POA: Diagnosis not present

## 2022-12-29 DIAGNOSIS — N186 End stage renal disease: Secondary | ICD-10-CM | POA: Diagnosis not present

## 2022-12-29 DIAGNOSIS — Z992 Dependence on renal dialysis: Secondary | ICD-10-CM | POA: Diagnosis not present

## 2022-12-31 DIAGNOSIS — N2581 Secondary hyperparathyroidism of renal origin: Secondary | ICD-10-CM | POA: Diagnosis not present

## 2022-12-31 DIAGNOSIS — Z992 Dependence on renal dialysis: Secondary | ICD-10-CM | POA: Diagnosis not present

## 2022-12-31 DIAGNOSIS — N186 End stage renal disease: Secondary | ICD-10-CM | POA: Diagnosis not present

## 2023-01-02 DIAGNOSIS — N2581 Secondary hyperparathyroidism of renal origin: Secondary | ICD-10-CM | POA: Diagnosis not present

## 2023-01-02 DIAGNOSIS — N186 End stage renal disease: Secondary | ICD-10-CM | POA: Diagnosis not present

## 2023-01-02 DIAGNOSIS — Z992 Dependence on renal dialysis: Secondary | ICD-10-CM | POA: Diagnosis not present

## 2023-01-05 DIAGNOSIS — N2581 Secondary hyperparathyroidism of renal origin: Secondary | ICD-10-CM | POA: Diagnosis not present

## 2023-01-05 DIAGNOSIS — Z992 Dependence on renal dialysis: Secondary | ICD-10-CM | POA: Diagnosis not present

## 2023-01-05 DIAGNOSIS — N186 End stage renal disease: Secondary | ICD-10-CM | POA: Diagnosis not present

## 2023-01-07 DIAGNOSIS — Z992 Dependence on renal dialysis: Secondary | ICD-10-CM | POA: Diagnosis not present

## 2023-01-07 DIAGNOSIS — N186 End stage renal disease: Secondary | ICD-10-CM | POA: Diagnosis not present

## 2023-01-07 DIAGNOSIS — N2581 Secondary hyperparathyroidism of renal origin: Secondary | ICD-10-CM | POA: Diagnosis not present

## 2023-01-08 DIAGNOSIS — Z992 Dependence on renal dialysis: Secondary | ICD-10-CM | POA: Diagnosis not present

## 2023-01-08 DIAGNOSIS — N186 End stage renal disease: Secondary | ICD-10-CM | POA: Diagnosis not present

## 2023-01-09 DIAGNOSIS — Z992 Dependence on renal dialysis: Secondary | ICD-10-CM | POA: Diagnosis not present

## 2023-01-09 DIAGNOSIS — N2581 Secondary hyperparathyroidism of renal origin: Secondary | ICD-10-CM | POA: Diagnosis not present

## 2023-01-09 DIAGNOSIS — N186 End stage renal disease: Secondary | ICD-10-CM | POA: Diagnosis not present

## 2023-01-12 DIAGNOSIS — N186 End stage renal disease: Secondary | ICD-10-CM | POA: Diagnosis not present

## 2023-01-12 DIAGNOSIS — N2581 Secondary hyperparathyroidism of renal origin: Secondary | ICD-10-CM | POA: Diagnosis not present

## 2023-01-12 DIAGNOSIS — Z992 Dependence on renal dialysis: Secondary | ICD-10-CM | POA: Diagnosis not present

## 2023-01-14 DIAGNOSIS — N186 End stage renal disease: Secondary | ICD-10-CM | POA: Diagnosis not present

## 2023-01-14 DIAGNOSIS — Z992 Dependence on renal dialysis: Secondary | ICD-10-CM | POA: Diagnosis not present

## 2023-01-14 DIAGNOSIS — N2581 Secondary hyperparathyroidism of renal origin: Secondary | ICD-10-CM | POA: Diagnosis not present

## 2023-01-16 DIAGNOSIS — N186 End stage renal disease: Secondary | ICD-10-CM | POA: Diagnosis not present

## 2023-01-16 DIAGNOSIS — N2581 Secondary hyperparathyroidism of renal origin: Secondary | ICD-10-CM | POA: Diagnosis not present

## 2023-01-16 DIAGNOSIS — Z992 Dependence on renal dialysis: Secondary | ICD-10-CM | POA: Diagnosis not present

## 2023-01-19 DIAGNOSIS — N2581 Secondary hyperparathyroidism of renal origin: Secondary | ICD-10-CM | POA: Diagnosis not present

## 2023-01-19 DIAGNOSIS — N186 End stage renal disease: Secondary | ICD-10-CM | POA: Diagnosis not present

## 2023-01-19 DIAGNOSIS — Z992 Dependence on renal dialysis: Secondary | ICD-10-CM | POA: Diagnosis not present

## 2023-01-21 DIAGNOSIS — Z992 Dependence on renal dialysis: Secondary | ICD-10-CM | POA: Diagnosis not present

## 2023-01-21 DIAGNOSIS — E785 Hyperlipidemia, unspecified: Secondary | ICD-10-CM | POA: Diagnosis not present

## 2023-01-21 DIAGNOSIS — I120 Hypertensive chronic kidney disease with stage 5 chronic kidney disease or end stage renal disease: Secondary | ICD-10-CM | POA: Diagnosis not present

## 2023-01-21 DIAGNOSIS — N2581 Secondary hyperparathyroidism of renal origin: Secondary | ICD-10-CM | POA: Diagnosis not present

## 2023-01-21 DIAGNOSIS — E1122 Type 2 diabetes mellitus with diabetic chronic kidney disease: Secondary | ICD-10-CM | POA: Diagnosis not present

## 2023-01-21 DIAGNOSIS — N186 End stage renal disease: Secondary | ICD-10-CM | POA: Diagnosis not present

## 2023-01-21 DIAGNOSIS — D631 Anemia in chronic kidney disease: Secondary | ICD-10-CM | POA: Diagnosis not present

## 2023-01-21 DIAGNOSIS — C61 Malignant neoplasm of prostate: Secondary | ICD-10-CM | POA: Diagnosis not present

## 2023-01-21 DIAGNOSIS — D8481 Immunodeficiency due to conditions classified elsewhere: Secondary | ICD-10-CM | POA: Diagnosis not present

## 2023-01-23 DIAGNOSIS — N186 End stage renal disease: Secondary | ICD-10-CM | POA: Diagnosis not present

## 2023-01-23 DIAGNOSIS — Z992 Dependence on renal dialysis: Secondary | ICD-10-CM | POA: Diagnosis not present

## 2023-01-23 DIAGNOSIS — N2581 Secondary hyperparathyroidism of renal origin: Secondary | ICD-10-CM | POA: Diagnosis not present

## 2023-01-26 DIAGNOSIS — K8020 Calculus of gallbladder without cholecystitis without obstruction: Secondary | ICD-10-CM | POA: Diagnosis not present

## 2023-01-26 DIAGNOSIS — C61 Malignant neoplasm of prostate: Secondary | ICD-10-CM | POA: Diagnosis not present

## 2023-01-26 DIAGNOSIS — I120 Hypertensive chronic kidney disease with stage 5 chronic kidney disease or end stage renal disease: Secondary | ICD-10-CM | POA: Diagnosis not present

## 2023-01-26 DIAGNOSIS — K429 Umbilical hernia without obstruction or gangrene: Secondary | ICD-10-CM | POA: Diagnosis not present

## 2023-01-26 DIAGNOSIS — Z992 Dependence on renal dialysis: Secondary | ICD-10-CM | POA: Diagnosis not present

## 2023-01-26 DIAGNOSIS — Z885 Allergy status to narcotic agent status: Secondary | ICD-10-CM | POA: Diagnosis not present

## 2023-01-26 DIAGNOSIS — N2581 Secondary hyperparathyroidism of renal origin: Secondary | ICD-10-CM | POA: Diagnosis not present

## 2023-01-26 DIAGNOSIS — R1032 Left lower quadrant pain: Secondary | ICD-10-CM | POA: Diagnosis not present

## 2023-01-26 DIAGNOSIS — E1122 Type 2 diabetes mellitus with diabetic chronic kidney disease: Secondary | ICD-10-CM | POA: Diagnosis not present

## 2023-01-26 DIAGNOSIS — N186 End stage renal disease: Secondary | ICD-10-CM | POA: Diagnosis not present

## 2023-01-27 ENCOUNTER — Telehealth: Payer: Self-pay

## 2023-01-27 NOTE — Transitions of Care (Post Inpatient/ED Visit) (Signed)
   01/27/2023  Name: Benjamin Stewart MRN: [id number] DOB: May 09, 1976  Today's TOC FU Call Status: Today's TOC FU Call Status:: Successful TOC FU Call Competed TOC FU Call Complete Date: 01/27/23  Transition Care Management Follow-up Telephone Call Date of Discharge: 01/26/23 Discharge Facility: [facility]) Type of Discharge: Emergency Department How have you been since you were released from the hospital?: Better Any questions or concerns?: No  Items Reviewed: Did you receive and understand the discharge instructions provided?: Yes Medications obtained,verified, and reconciled?: Yes (Medications Reviewed) Any new allergies since your discharge?: No Dietary orders reviewed?: No Do you have support at home?: No  Medications Reviewed Today: Medications Reviewed Today     Reviewed by Benjamin Spinner, NP (Nurse Practitioner) on 12/22/22 at 0238  Med List Status: <None>   Medication Order Taking? Sig Documenting Provider Last Dose Status Informant  ASPIRIN LOW DOSE 81 MG tablet [id number] Yes TAKE 1 TABLET BY MOUTH DAILY Benjamin Spinner, NP Taking Active   atorvastatin (LIPITOR) 10 MG tablet [id number] Yes Take 1 tablet (10 mg total) by mouth daily. Benjamin Milan, MD Taking Active   carvedilol (COREG) 6.25 MG tablet [id number] Yes Take 6.25 mg by mouth 2 (two) times daily with a meal.  [provider] Taking Active Self  cinacalcet (SENSIPAR) 30 MG tablet [id number] Yes Take 30 mg by mouth daily. [provider] Taking Active   clopidogrel (PLAVIX) 75 MG tablet [id number] Yes TAKE 1 TABLET(75 MG) BY MOUTH DAILY Benjamin Spinner, NP Taking Active   furosemide (LASIX) 80 MG tablet [id number] Yes Take 80 mg by mouth daily. [provider] Taking Active   mometasone (ELOCON) 0.1 % cream [id number] Yes Apply topically daily. [provider] Taking Active   XPHOZAH 30 MG TABS [id number] Yes Take by mouth. [provider] Taking  Active             Home Care and Equipment/Supplies: Were Home Health Services Ordered?: No Any new equipment or medical supplies ordered?: No  Functional Questionnaire: Do you need assistance with bathing/showering or dressing?: No Do you need assistance with meal preparation?: No Do you need assistance with eating?: No Do you have difficulty maintaining continence: No Do you need assistance with getting out of bed/getting out of a chair/moving?: No Do you have difficulty managing or taking your medications?: No  Follow up appointments reviewed: PCP Follow-up appointment confirmed?: Yes Date of PCP follow-up appointment?: 02/03/23 Follow-up Provider: Bari Edward, MD [facility] Follow-up appointment confirmed?: No Do you need transportation to your follow-up appointment?: No Do you understand care options if your condition(s) worsen?: Yes-patient verbalized understanding    Benjamin Stewart [facility]  Primary Care & Sports Medicine at [facility] CMA, AAMA [address]  [place] [address] Office [phone]  Fax: [phone]

## 2023-01-28 DIAGNOSIS — Z992 Dependence on renal dialysis: Secondary | ICD-10-CM | POA: Diagnosis not present

## 2023-01-28 DIAGNOSIS — N186 End stage renal disease: Secondary | ICD-10-CM | POA: Diagnosis not present

## 2023-01-28 DIAGNOSIS — N2581 Secondary hyperparathyroidism of renal origin: Secondary | ICD-10-CM | POA: Diagnosis not present

## 2023-01-30 DIAGNOSIS — Z992 Dependence on renal dialysis: Secondary | ICD-10-CM | POA: Diagnosis not present

## 2023-01-30 DIAGNOSIS — N2581 Secondary hyperparathyroidism of renal origin: Secondary | ICD-10-CM | POA: Diagnosis not present

## 2023-01-30 DIAGNOSIS — N186 End stage renal disease: Secondary | ICD-10-CM | POA: Diagnosis not present

## 2023-02-02 DIAGNOSIS — Z992 Dependence on renal dialysis: Secondary | ICD-10-CM | POA: Diagnosis not present

## 2023-02-02 DIAGNOSIS — N2581 Secondary hyperparathyroidism of renal origin: Secondary | ICD-10-CM | POA: Diagnosis not present

## 2023-02-02 DIAGNOSIS — N186 End stage renal disease: Secondary | ICD-10-CM | POA: Diagnosis not present

## 2023-02-03 ENCOUNTER — Ambulatory Visit (INDEPENDENT_AMBULATORY_CARE_PROVIDER_SITE_OTHER): Payer: Medicare HMO | Admitting: Internal Medicine

## 2023-02-03 ENCOUNTER — Encounter: Payer: Self-pay | Admitting: Internal Medicine

## 2023-02-03 VITALS — BP 128/82 | HR 86 | Ht 71.0 in | Wt 286.0 lb

## 2023-02-03 DIAGNOSIS — Z992 Dependence on renal dialysis: Secondary | ICD-10-CM | POA: Insufficient documentation

## 2023-02-03 DIAGNOSIS — I770 Arteriovenous fistula, acquired: Secondary | ICD-10-CM | POA: Insufficient documentation

## 2023-02-03 DIAGNOSIS — K8070 Calculus of gallbladder and bile duct without cholecystitis without obstruction: Secondary | ICD-10-CM | POA: Diagnosis not present

## 2023-02-03 NOTE — Assessment & Plan Note (Signed)
His pain resolved spontaneously - he likely passed the stone that was in his duct. Hopefully the remaining stones will remain asymptomatic. He follows a low fat renal diet and will continue.

## 2023-02-03 NOTE — Progress Notes (Signed)
Date:  02/03/2023   Name:  Benjamin Stewart   DOB:  1976-03-20   MRN:  [id number]   Chief Complaint: Hospitalization Follow-up Patient seen in ED at [facility] on 01/26/23. He had abdominal pain and a CT scan showed gallstones. His pain resolved over time without specific treatment.    CT A/P remarkable for cholelithiasis, including within the neck of the gallbladder with questionable pericholecystic stranding. This nonspecific but can be seen with early obstruction/cholecystitis. There is also stranding of inferior paraumbilical fat-containing hernia which may represent which may be reactive or related to early fat necrosis. No evidence of diverticulitis as clinically questioned.   WBC 11.5 with normal diff.  HPI  Lab Results  Component Value Date   NA 138 11/30/2022   K 5.7 (H) 11/30/2022   CO2 18 (L) 11/30/2022   GLUCOSE 98 11/30/2022   BUN 66 (H) 11/30/2022   CREATININE 12.17 (H) 11/30/2022   CALCIUM 9.1 11/30/2022   EGFR 5 (L) 11/30/2022   GFRNONAA 5 (L) 05/07/2021   Lab Results  Component Value Date   CHOL 118 11/30/2022   HDL 35 (L) 11/30/2022   LDLCALC 65 11/30/2022   TRIG 92 11/30/2022   CHOLHDL 3.4 11/30/2022   Lab Results  Component Value Date   TSH 1.173 11/09/2019   Lab Results  Component Value Date   HGBA1C 5.9 (H) 11/30/2022   Lab Results  Component Value Date   WBC 9.6 11/30/2022   HGB 16.1 11/30/2022   HCT 47.7 11/30/2022   MCV 91 11/30/2022   PLT 309 11/30/2022   Lab Results  Component Value Date   ALT 7 11/30/2022   AST 9 11/30/2022   ALKPHOS 80 11/30/2022   BILITOT 0.6 11/30/2022   No results found for: "25OHVITD2", "25OHVITD3", "VD25OH"   Review of Systems  Constitutional:  Negative for chills and fatigue.  Respiratory:  Negative for chest tightness, shortness of breath and wheezing.   Cardiovascular:  Negative for chest pain.  Gastrointestinal:  Negative for abdominal pain, constipation, diarrhea and vomiting.   Neurological:  Negative for dizziness and light-headedness.  Psychiatric/Behavioral:  Negative for dysphoric mood and sleep disturbance. The patient is not nervous/anxious.     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) 02/03/2023   Calculus of gallbladder and bile duct without cholecystitis or obstruction 02/03/2023   End stage renal disease (HCC) 04/11/2020   Secondary hyperparathyroidism of renal origin (HCC) 04/24/2019   Hyperkalemia 04/24/2019   Localized edema 01/19/2019   Chronic gout of multiple sites 08/16/2018   Prostate cancer (HCC) 08/23/2015   Hyperlipidemia, mixed 07/16/2015   Essential (primary) hypertension 12/04/2014    Allergies  Allergen Reactions   Glimepiride Other (See Comments)    Severe hypoglycemia   Other    Percocet [Oxycodone-Acetaminophen] Nausea And Vomiting    Past Surgical History:  Procedure Laterality Date   A/V FISTULAGRAM Left 05/30/2020   Procedure: A/V FISTULAGRAM;  Surgeon: Annice Needy, MD;  Location: [facility];  Service: Cardiovascular;  Laterality: Left;   AV FISTULA PLACEMENT Left 03/07/2020   Procedure: ARTERIOVENOUS (AV) FISTULA CREATION BRACHIAL CEPHALIC;  Surgeon: Annice Needy, MD;  Location: [facility];  Service: Vascular;  Laterality: Left;   DIALYSIS/PERMA CATHETER INSERTION N/A 01/17/2020   Procedure: DIALYSIS/PERMA CATHETER INSERTION;  Surgeon: Annice Needy, MD;  Location: [facility];  Service: Cardiovascular;  Laterality:  N/A;   DIALYSIS/PERMA CATHETER REMOVAL N/A 10/07/2020   Procedure: DIALYSIS/PERMA CATHETER REMOVAL;  Surgeon: Annice Needy, MD;  Location: [facility];  Service: Cardiovascular;  Laterality: N/A;   PARTIAL COLECTOMY  2005   RADIOACTIVE SEED IMPLANT N/A 12/06/2017   Procedure: RADIOACTIVE SEED IMPLANT/BRACHYTHERAPY IMPLANT;  Surgeon: Vanna Scotland, MD;  Location: [facility];  Service:  Urology;  Laterality: N/A;   UPPER EXTREMITY ANGIOGRAPHY Left 04/18/2020   Procedure: UPPER EXTREMITY ANGIOGRAPHY;  Surgeon: Annice Needy, MD;  Location: [facility];  Service: Cardiovascular;  Laterality: Left;    Social History   Tobacco Use   Smoking status: Never   Smokeless tobacco: Never  Vaping Use   Vaping Use: Never used  Substance Use Topics   Alcohol use: No    Alcohol/week: 0.0 standard drinks of alcohol   Drug use: No     Medication list has been reviewed and updated.  Current Meds  Medication Sig   amLODipine (NORVASC) 10 MG tablet 10 mg daily.   ASPIRIN LOW DOSE 81 MG tablet TAKE 1 TABLET BY MOUTH DAILY   atorvastatin (LIPITOR) 10 MG tablet Take 1 tablet (10 mg total) by mouth daily.   carvedilol (COREG) 6.25 MG tablet Take 6.25 mg by mouth 2 (two) times daily with a meal.    cinacalcet (SENSIPAR) 30 MG tablet Take 30 mg by mouth daily.   clopidogrel (PLAVIX) 75 MG tablet TAKE 1 TABLET(75 MG) BY MOUTH DAILY   furosemide (LASIX) 80 MG tablet Take 80 mg by mouth daily.   midodrine (PROAMATINE) 5 MG tablet Take 5 mg by mouth 2 (two) times daily with a meal.   mometasone (ELOCON) 0.1 % cream Apply topically daily.   sevelamer carbonate (RENVELA) 800 MG tablet Take 800 mg by mouth 3 (three) times daily with meals.   XPHOZAH 30 MG TABS Take by mouth.       02/03/2023    1:41 PM 11/30/2022    9:12 AM 11/26/2021    8:46 AM 05/23/2021    1:55 PM  GAD 7 : Generalized Anxiety Score  Nervous, Anxious, on Edge 0 0 0 0  Control/stop worrying 0 0 0 0  Worry too much - different things 0 0 0 0  Trouble relaxing 0 0 0 0  Restless 0 0 0 0  Easily annoyed or irritable 0 0 0 0  Afraid - awful might happen 0 0 0 0  Total GAD 7 Score 0 0 0 0  Anxiety Difficulty Not difficult at all Not difficult at all Not difficult at all Not difficult at all       02/03/2023    1:41 PM 11/30/2022    9:12 AM 11/26/2021    8:45 AM  Depression screen PHQ 2/9  Decreased Interest 0  0 0  Down, Depressed, Hopeless 0 0 0  PHQ - 2 Score 0 0 0  Altered sleeping 0 0 0  Tired, decreased energy 0 0 0  Change in appetite 0 0 0  Feeling bad or failure about yourself  0 0 0  Trouble concentrating 0 0 0  Moving slowly or fidgety/restless 0 0 0  Suicidal thoughts 0 0 0  PHQ-9 Score 0 0 0  Difficult doing work/chores Not difficult at all Not difficult at all Not difficult at all    BP Readings from Last 3 Encounters:  02/03/23 128/82  12/21/22 131/85  11/30/22 128/74    Physical Exam Vitals and nursing note reviewed.  Constitutional:      General: He is not in acute distress.    Appearance: Normal appearance. He is well-developed.  HENT:     Head: Normocephalic and atraumatic.  Cardiovascular:     Rate and Rhythm: Normal rate and regular rhythm.  Pulmonary:     Effort: Pulmonary effort is normal. No respiratory distress.     Breath sounds: No wheezing or rhonchi.  Abdominal:     General: There is no distension.     Palpations: Abdomen is soft.     Tenderness: There is no abdominal tenderness. There is no guarding or rebound.     Hernia: No hernia is present.  Skin:    General: Skin is warm and dry.     Findings: No rash.  Neurological:     Mental Status: He is alert and oriented to person, place, and time.  Psychiatric:        Mood and Affect: Mood normal.        Behavior: Behavior normal.     Wt Readings from Last 3 Encounters:  02/03/23 286 lb (129.7 kg)  12/21/22 283 lb 6.4 oz (128.5 kg)  11/30/22 282 lb 4.8 oz (128.1 kg)    BP 128/82   Pulse 86   Ht 5\' 11"  (1.803 m)   Wt 286 lb (129.7 kg)   SpO2 97%   BMI 39.89 kg/m   Assessment and Plan:  Problem List Items Addressed This Visit     Calculus of gallbladder and bile duct without cholecystitis or obstruction - Primary    His pain resolved spontaneously - he likely passed the stone that was in his duct. Hopefully the remaining stones will remain asymptomatic. He follows a low fat renal  diet and will continue.       No follow-ups on file.   Partially dictated using Dragon software, any errors are not intentional.  Reubin Milan, MD [facility] [place], [place]

## 2023-02-04 DIAGNOSIS — N186 End stage renal disease: Secondary | ICD-10-CM | POA: Diagnosis not present

## 2023-02-04 DIAGNOSIS — Z992 Dependence on renal dialysis: Secondary | ICD-10-CM | POA: Diagnosis not present

## 2023-02-04 DIAGNOSIS — N2581 Secondary hyperparathyroidism of renal origin: Secondary | ICD-10-CM | POA: Diagnosis not present

## 2023-02-06 DIAGNOSIS — N2581 Secondary hyperparathyroidism of renal origin: Secondary | ICD-10-CM | POA: Diagnosis not present

## 2023-02-06 DIAGNOSIS — N186 End stage renal disease: Secondary | ICD-10-CM | POA: Diagnosis not present

## 2023-02-06 DIAGNOSIS — Z992 Dependence on renal dialysis: Secondary | ICD-10-CM | POA: Diagnosis not present

## 2023-02-07 DIAGNOSIS — Z992 Dependence on renal dialysis: Secondary | ICD-10-CM | POA: Diagnosis not present

## 2023-02-07 DIAGNOSIS — N186 End stage renal disease: Secondary | ICD-10-CM | POA: Diagnosis not present

## 2023-02-09 DIAGNOSIS — Z992 Dependence on renal dialysis: Secondary | ICD-10-CM | POA: Diagnosis not present

## 2023-02-09 DIAGNOSIS — N186 End stage renal disease: Secondary | ICD-10-CM | POA: Diagnosis not present

## 2023-02-09 DIAGNOSIS — N2581 Secondary hyperparathyroidism of renal origin: Secondary | ICD-10-CM | POA: Diagnosis not present

## 2023-02-11 DIAGNOSIS — N186 End stage renal disease: Secondary | ICD-10-CM | POA: Diagnosis not present

## 2023-02-11 DIAGNOSIS — N2581 Secondary hyperparathyroidism of renal origin: Secondary | ICD-10-CM | POA: Diagnosis not present

## 2023-02-11 DIAGNOSIS — Z992 Dependence on renal dialysis: Secondary | ICD-10-CM | POA: Diagnosis not present

## 2023-02-13 DIAGNOSIS — N186 End stage renal disease: Secondary | ICD-10-CM | POA: Diagnosis not present

## 2023-02-13 DIAGNOSIS — Z992 Dependence on renal dialysis: Secondary | ICD-10-CM | POA: Diagnosis not present

## 2023-02-13 DIAGNOSIS — N2581 Secondary hyperparathyroidism of renal origin: Secondary | ICD-10-CM | POA: Diagnosis not present

## 2023-02-16 DIAGNOSIS — N186 End stage renal disease: Secondary | ICD-10-CM | POA: Diagnosis not present

## 2023-02-16 DIAGNOSIS — Z992 Dependence on renal dialysis: Secondary | ICD-10-CM | POA: Diagnosis not present

## 2023-02-16 DIAGNOSIS — N2581 Secondary hyperparathyroidism of renal origin: Secondary | ICD-10-CM | POA: Diagnosis not present

## 2023-02-18 DIAGNOSIS — Z992 Dependence on renal dialysis: Secondary | ICD-10-CM | POA: Diagnosis not present

## 2023-02-18 DIAGNOSIS — N2581 Secondary hyperparathyroidism of renal origin: Secondary | ICD-10-CM | POA: Diagnosis not present

## 2023-02-18 DIAGNOSIS — N186 End stage renal disease: Secondary | ICD-10-CM | POA: Diagnosis not present

## 2023-02-20 DIAGNOSIS — Z992 Dependence on renal dialysis: Secondary | ICD-10-CM | POA: Diagnosis not present

## 2023-02-20 DIAGNOSIS — N2581 Secondary hyperparathyroidism of renal origin: Secondary | ICD-10-CM | POA: Diagnosis not present

## 2023-02-20 DIAGNOSIS — N186 End stage renal disease: Secondary | ICD-10-CM | POA: Diagnosis not present

## 2023-02-23 DIAGNOSIS — N2581 Secondary hyperparathyroidism of renal origin: Secondary | ICD-10-CM | POA: Diagnosis not present

## 2023-02-23 DIAGNOSIS — N186 End stage renal disease: Secondary | ICD-10-CM | POA: Diagnosis not present

## 2023-02-23 DIAGNOSIS — Z992 Dependence on renal dialysis: Secondary | ICD-10-CM | POA: Diagnosis not present

## 2023-02-25 DIAGNOSIS — N186 End stage renal disease: Secondary | ICD-10-CM | POA: Diagnosis not present

## 2023-02-25 DIAGNOSIS — N2581 Secondary hyperparathyroidism of renal origin: Secondary | ICD-10-CM | POA: Diagnosis not present

## 2023-02-25 DIAGNOSIS — Z992 Dependence on renal dialysis: Secondary | ICD-10-CM | POA: Diagnosis not present

## 2023-02-27 DIAGNOSIS — Z992 Dependence on renal dialysis: Secondary | ICD-10-CM | POA: Diagnosis not present

## 2023-02-27 DIAGNOSIS — N2581 Secondary hyperparathyroidism of renal origin: Secondary | ICD-10-CM | POA: Diagnosis not present

## 2023-02-27 DIAGNOSIS — N186 End stage renal disease: Secondary | ICD-10-CM | POA: Diagnosis not present

## 2023-03-02 DIAGNOSIS — N186 End stage renal disease: Secondary | ICD-10-CM | POA: Diagnosis not present

## 2023-03-02 DIAGNOSIS — Z992 Dependence on renal dialysis: Secondary | ICD-10-CM | POA: Diagnosis not present

## 2023-03-02 DIAGNOSIS — N2581 Secondary hyperparathyroidism of renal origin: Secondary | ICD-10-CM | POA: Diagnosis not present

## 2023-03-04 DIAGNOSIS — N186 End stage renal disease: Secondary | ICD-10-CM | POA: Diagnosis not present

## 2023-03-04 DIAGNOSIS — Z992 Dependence on renal dialysis: Secondary | ICD-10-CM | POA: Diagnosis not present

## 2023-03-04 DIAGNOSIS — N2581 Secondary hyperparathyroidism of renal origin: Secondary | ICD-10-CM | POA: Diagnosis not present

## 2023-03-06 DIAGNOSIS — N186 End stage renal disease: Secondary | ICD-10-CM | POA: Diagnosis not present

## 2023-03-06 DIAGNOSIS — N2581 Secondary hyperparathyroidism of renal origin: Secondary | ICD-10-CM | POA: Diagnosis not present

## 2023-03-06 DIAGNOSIS — Z992 Dependence on renal dialysis: Secondary | ICD-10-CM | POA: Diagnosis not present

## 2023-03-09 DIAGNOSIS — Z992 Dependence on renal dialysis: Secondary | ICD-10-CM | POA: Diagnosis not present

## 2023-03-09 DIAGNOSIS — N2581 Secondary hyperparathyroidism of renal origin: Secondary | ICD-10-CM | POA: Diagnosis not present

## 2023-03-09 DIAGNOSIS — N186 End stage renal disease: Secondary | ICD-10-CM | POA: Diagnosis not present

## 2023-03-10 DIAGNOSIS — N186 End stage renal disease: Secondary | ICD-10-CM | POA: Diagnosis not present

## 2023-03-10 DIAGNOSIS — Z992 Dependence on renal dialysis: Secondary | ICD-10-CM | POA: Diagnosis not present

## 2023-03-11 DIAGNOSIS — E8779 Other fluid overload: Secondary | ICD-10-CM | POA: Diagnosis not present

## 2023-03-11 DIAGNOSIS — N2581 Secondary hyperparathyroidism of renal origin: Secondary | ICD-10-CM | POA: Diagnosis not present

## 2023-03-11 DIAGNOSIS — Z992 Dependence on renal dialysis: Secondary | ICD-10-CM | POA: Diagnosis not present

## 2023-03-11 DIAGNOSIS — N186 End stage renal disease: Secondary | ICD-10-CM | POA: Diagnosis not present

## 2023-03-13 DIAGNOSIS — N186 End stage renal disease: Secondary | ICD-10-CM | POA: Diagnosis not present

## 2023-03-13 DIAGNOSIS — E8779 Other fluid overload: Secondary | ICD-10-CM | POA: Diagnosis not present

## 2023-03-13 DIAGNOSIS — N2581 Secondary hyperparathyroidism of renal origin: Secondary | ICD-10-CM | POA: Diagnosis not present

## 2023-03-13 DIAGNOSIS — Z992 Dependence on renal dialysis: Secondary | ICD-10-CM | POA: Diagnosis not present

## 2023-03-16 DIAGNOSIS — E8779 Other fluid overload: Secondary | ICD-10-CM | POA: Diagnosis not present

## 2023-03-16 DIAGNOSIS — N2581 Secondary hyperparathyroidism of renal origin: Secondary | ICD-10-CM | POA: Diagnosis not present

## 2023-03-16 DIAGNOSIS — N186 End stage renal disease: Secondary | ICD-10-CM | POA: Diagnosis not present

## 2023-03-16 DIAGNOSIS — Z992 Dependence on renal dialysis: Secondary | ICD-10-CM | POA: Diagnosis not present

## 2023-03-18 DIAGNOSIS — N2581 Secondary hyperparathyroidism of renal origin: Secondary | ICD-10-CM | POA: Diagnosis not present

## 2023-03-18 DIAGNOSIS — E8779 Other fluid overload: Secondary | ICD-10-CM | POA: Diagnosis not present

## 2023-03-18 DIAGNOSIS — Z992 Dependence on renal dialysis: Secondary | ICD-10-CM | POA: Diagnosis not present

## 2023-03-18 DIAGNOSIS — N186 End stage renal disease: Secondary | ICD-10-CM | POA: Diagnosis not present

## 2023-03-20 DIAGNOSIS — N2581 Secondary hyperparathyroidism of renal origin: Secondary | ICD-10-CM | POA: Diagnosis not present

## 2023-03-20 DIAGNOSIS — Z992 Dependence on renal dialysis: Secondary | ICD-10-CM | POA: Diagnosis not present

## 2023-03-20 DIAGNOSIS — N186 End stage renal disease: Secondary | ICD-10-CM | POA: Diagnosis not present

## 2023-03-20 DIAGNOSIS — E8779 Other fluid overload: Secondary | ICD-10-CM | POA: Diagnosis not present

## 2023-03-22 ENCOUNTER — Encounter (INDEPENDENT_AMBULATORY_CARE_PROVIDER_SITE_OTHER): Payer: 59

## 2023-03-22 ENCOUNTER — Ambulatory Visit (INDEPENDENT_AMBULATORY_CARE_PROVIDER_SITE_OTHER): Payer: 59 | Admitting: Nurse Practitioner

## 2023-03-23 DIAGNOSIS — E8779 Other fluid overload: Secondary | ICD-10-CM | POA: Diagnosis not present

## 2023-03-23 DIAGNOSIS — N186 End stage renal disease: Secondary | ICD-10-CM | POA: Diagnosis not present

## 2023-03-23 DIAGNOSIS — Z992 Dependence on renal dialysis: Secondary | ICD-10-CM | POA: Diagnosis not present

## 2023-03-23 DIAGNOSIS — N2581 Secondary hyperparathyroidism of renal origin: Secondary | ICD-10-CM | POA: Diagnosis not present

## 2023-03-24 ENCOUNTER — Other Ambulatory Visit (INDEPENDENT_AMBULATORY_CARE_PROVIDER_SITE_OTHER): Payer: Self-pay | Admitting: Nurse Practitioner

## 2023-03-24 DIAGNOSIS — N186 End stage renal disease: Secondary | ICD-10-CM

## 2023-03-25 DIAGNOSIS — N186 End stage renal disease: Secondary | ICD-10-CM | POA: Diagnosis not present

## 2023-03-25 DIAGNOSIS — Z992 Dependence on renal dialysis: Secondary | ICD-10-CM | POA: Diagnosis not present

## 2023-03-25 DIAGNOSIS — N2581 Secondary hyperparathyroidism of renal origin: Secondary | ICD-10-CM | POA: Diagnosis not present

## 2023-03-25 DIAGNOSIS — E8779 Other fluid overload: Secondary | ICD-10-CM | POA: Diagnosis not present

## 2023-03-26 ENCOUNTER — Ambulatory Visit (INDEPENDENT_AMBULATORY_CARE_PROVIDER_SITE_OTHER): Payer: 59

## 2023-03-26 ENCOUNTER — Ambulatory Visit (INDEPENDENT_AMBULATORY_CARE_PROVIDER_SITE_OTHER): Payer: Medicare HMO | Admitting: Nurse Practitioner

## 2023-03-26 ENCOUNTER — Encounter (INDEPENDENT_AMBULATORY_CARE_PROVIDER_SITE_OTHER): Payer: Self-pay | Admitting: Nurse Practitioner

## 2023-03-26 VITALS — BP 128/93 | HR 69 | Resp 16 | Wt 291.8 lb

## 2023-03-26 DIAGNOSIS — N186 End stage renal disease: Secondary | ICD-10-CM

## 2023-03-26 DIAGNOSIS — I10 Essential (primary) hypertension: Secondary | ICD-10-CM

## 2023-03-26 DIAGNOSIS — E782 Mixed hyperlipidemia: Secondary | ICD-10-CM | POA: Diagnosis not present

## 2023-03-26 NOTE — Progress Notes (Signed)
Subjective:    Patient ID: KCI BAES, male    DOB: [birth year], 47 y.o.   MRN: [id number] Chief Complaint  Patient presents with   Follow-up    3 month HDA    The patient returns to the office for followup of their dialysis access.   The patient reports the function of the access has been stable. Patient denies difficulty with cannulation. The patient denies increased bleeding time after removing the needles. The patient denies hand pain or other symptoms consistent with steal phenomena.  No significant arm swelling.  The patient denies any complaints from the dialysis center or their nephrologist.  The patient denies redness or swelling at the access site. The patient denies fever or chills at home or while on dialysis.  No recent shortening of the patient's walking distance or new symptoms consistent with claudication.  No history of rest pain symptoms. No new ulcers or wounds of the lower extremities have occurred.  The patient denies amaurosis fugax or recent TIA symptoms. There are no recent neurological changes noted. There is no history of DVT, PE or superficial thrombophlebitis. No recent episodes of angina or shortness of breath documented.   Duplex ultrasound of the AV access shows a patent access.  Today no significant stenosis is noted.  He has a flow volume of 1789.  He had a previous flow volume of 3088.      Review of Systems  Hematological:  Does not bruise/bleed easily.  All other systems reviewed and are negative.      Objective:   Physical Exam Vitals reviewed.  HENT:     Head: Normocephalic.  Cardiovascular:     Rate and Rhythm: Normal rate.     Arteriovenous access: Left arteriovenous access is present.    Comments: Tortuous aneurysmal fistula with no evidence of skin threatening.  Good thrill with slight whistling and bruit.  This has been consistent with his previous physical exam Pulmonary:     Effort: Pulmonary effort is normal.  Skin:     General: Skin is warm and dry.  Neurological:     Mental Status: He is alert and oriented to person, place, and time.  Psychiatric:        Mood and Affect: Mood normal.        Behavior: Behavior normal.        Thought Content: Thought content normal.        Judgment: Judgment normal.     There were no vitals taken for this visit.  Past Medical History:  Diagnosis Date   Anemia    Benign essential hematuria 12/04/2014   microscopic hematuria evaluation with CT and cysto - treated for 2 months with bactrim DS; no further f/u planned unless hematuria recurs.    Chronic kidney disease    Controlled type 2 diabetes mellitus without complication, without long-term current use of insulin (HCC) 07/16/2015   COVID-19 virus infection 12/29/2019   Essential (primary) hypertension 12/04/2014   History of kidney stones    H/O   Malignant essential hypertension 12/04/2014   prostate cancer    PROSTATE    Sleep apnea    DOES NOT USE CPAP    Social History   Socioeconomic History   Marital status: Divorced    Spouse name: Not on file   Number of children: Not on file   Years of education: Not on file   Highest education level: Not on file  Occupational History   Not on file  Tobacco Use   Smoking status: Never   Smokeless tobacco: Never  Vaping Use   Vaping status: Never Used  Substance and Sexual Activity   Alcohol use: No    Alcohol/week: 0.0 standard drinks of alcohol   Drug use: No   Sexual activity: Yes  Other Topics Concern   Not on file  Social History Narrative   Not on file   Social Determinants of Health   Financial Resource Strain: Low Risk  (11/30/2022)   Overall Financial Resource Strain (CARDIA)    Difficulty of Paying Living Expenses: Not hard at all  Food Insecurity: No Food Insecurity (11/30/2022)   Hunger Vital Sign    Worried About Running Out of Food in the Last Year: Never true    Ran Out of Food in the Last Year: Never true  Transportation Needs: No  Transportation Needs (11/30/2022)   PRAPARE - Administrator, Civil Service (Medical): No    Lack of Transportation (Non-Medical): No  Physical Activity: Inactive (11/30/2022)   Exercise Vital Sign    Days of Exercise per Week: 0 days    Minutes of Exercise per Session: 0 min  Stress: No Stress Concern Present (11/30/2022)   [employer] of Occupational Health - Occupational Stress Questionnaire    Feeling of Stress : Only a little  Social Connections: Not on file  Intimate Partner Violence: Not At Risk (11/30/2022)   Humiliation, Afraid, Rape, and Kick questionnaire    Fear of Current or Ex-Partner: No    Emotionally Abused: No    Physically Abused: No    Sexually Abused: No    Past Surgical History:  Procedure Laterality Date   A/V FISTULAGRAM Left 05/30/2020   Procedure: A/V FISTULAGRAM;  Surgeon: Annice Needy, MD;  Location: [facility];  Service: Cardiovascular;  Laterality: Left;   AV FISTULA PLACEMENT Left 03/07/2020   Procedure: ARTERIOVENOUS (AV) FISTULA CREATION BRACHIAL CEPHALIC;  Surgeon: Annice Needy, MD;  Location: [facility];  Service: Vascular;  Laterality: Left;   DIALYSIS/PERMA CATHETER INSERTION N/A 01/17/2020   Procedure: DIALYSIS/PERMA CATHETER INSERTION;  Surgeon: Annice Needy, MD;  Location: [facility];  Service: Cardiovascular;  Laterality: N/A;   DIALYSIS/PERMA CATHETER REMOVAL N/A 10/07/2020   Procedure: DIALYSIS/PERMA CATHETER REMOVAL;  Surgeon: Annice Needy, MD;  Location: [facility];  Service: Cardiovascular;  Laterality: N/A;   PARTIAL COLECTOMY  2005   RADIOACTIVE SEED IMPLANT N/A 12/06/2017   Procedure: RADIOACTIVE SEED IMPLANT/BRACHYTHERAPY IMPLANT;  Surgeon: Vanna Scotland, MD;  Location: [facility];  Service: Urology;  Laterality: N/A;   UPPER EXTREMITY ANGIOGRAPHY Left 04/18/2020   Procedure: UPPER EXTREMITY ANGIOGRAPHY;  Surgeon: Annice Needy, MD;  Location: [facility];  Service: Cardiovascular;   Laterality: Left;    Family History  Problem Relation Age of Onset   Diabetes Mother    Heart disease Mother    Hypertension Mother    Glaucoma Mother    Prostate cancer Father    Diabetes Maternal Grandmother    Diabetes Maternal Grandfather    Prostate cancer Maternal Uncle     Allergies  Allergen Reactions   Glimepiride Other (See Comments)    Severe hypoglycemia   Other    Percocet [Oxycodone-Acetaminophen] Nausea And Vomiting       Latest Ref Rng & Units 11/30/2022   10:08 AM 10/15/2021   12:00 AM 05/03/2021   10:00 AM  CBC  WBC 3.4 - 10.8 x10E3/uL 9.6  8.7     7.6   Hemoglobin 13.0 - 17.7 g/dL 42.5  95.6     38.7   Hematocrit 37.5 - 51.0 % 47.7  44     38.7   Platelets 150 - 450 x10E3/uL 309  274     284      This result is from an external source.      CMP     Component Value Date/Time   NA 138 11/30/2022 1008   NA 143 02/10/2012 1850   K 5.7 (H) 11/30/2022 1008   K 3.8 02/10/2012 1850   CL 93 (L) 11/30/2022 1008   CL 105 02/10/2012 1850   CO2 18 (L) 11/30/2022 1008   CO2 30 02/10/2012 1850   GLUCOSE 98 11/30/2022 1008   GLUCOSE 108 (H) 05/07/2021 1750   GLUCOSE 127 (H) 02/10/2012 1850   BUN 66 (H) 11/30/2022 1008   BUN 11 02/10/2012 1850   CREATININE 12.17 (H) 11/30/2022 1008   CREATININE 1.17 02/10/2012 1850   CALCIUM 9.1 11/30/2022 1008   CALCIUM 8.8 02/10/2012 1850   PROT 7.7 11/30/2022 1008   ALBUMIN 4.2 11/30/2022 1008   AST 9 11/30/2022 1008   ALT 7 11/30/2022 1008   ALKPHOS 80 11/30/2022 1008   BILITOT 0.6 11/30/2022 1008   EGFR 5 (L) 11/30/2022 1008   GFRNONAA 5 (L) 05/07/2021 1750   GFRNONAA >60 02/10/2012 1850     No results found.     Assessment & Plan:   1. ESRD (end stage renal disease) (HCC) Recommend:  The patient is doing well and currently has adequate dialysis access. The patient's dialysis center is not reporting any access issues. Flow pattern is stable when compared to the prior ultrasound.  The patient should  have a duplex ultrasound of the dialysis access in 6 months. The patient will follow-up with me in the office after each ultrasound    2. Essential (primary) hypertension Continue antihypertensive medications as already ordered, these medications have been reviewed and there are no changes at this time.  3. Hyperlipidemia, mixed Continue statin as ordered and reviewed, no changes at this time   Current Outpatient Medications on File Prior to Visit  Medication Sig Dispense Refill   amLODipine (NORVASC) 10 MG tablet 10 mg daily.     ASPIRIN LOW DOSE 81 MG tablet TAKE 1 TABLET BY MOUTH DAILY 150 tablet 2   atorvastatin (LIPITOR) 10 MG tablet Take 1 tablet (10 mg total) by mouth daily. 90 tablet 3   carvedilol (COREG) 6.25 MG tablet Take 6.25 mg by mouth 2 (two) times daily with a meal.      cinacalcet (SENSIPAR) 30 MG tablet Take 30 mg by mouth daily.     clopidogrel (PLAVIX) 75 MG tablet TAKE 1 TABLET(75 MG) BY MOUTH DAILY 30 tablet 11   furosemide (LASIX) 80 MG tablet Take 80 mg by mouth daily.     midodrine (PROAMATINE) 5 MG tablet Take 5 mg by mouth 2 (two) times daily with a meal.     mometasone (ELOCON) 0.1 % cream Apply topically daily.     sevelamer carbonate (RENVELA) 800 MG tablet Take 800 mg by mouth 3 (three) times daily with meals.     XPHOZAH 30 MG TABS Take by mouth.     No current facility-administered medications on file prior to visit.    There are no Patient Instructions on file for this visit. No follow-ups on file.   Georgiana Spinner, NP

## 2023-03-27 DIAGNOSIS — N186 End stage renal disease: Secondary | ICD-10-CM | POA: Diagnosis not present

## 2023-03-27 DIAGNOSIS — N2581 Secondary hyperparathyroidism of renal origin: Secondary | ICD-10-CM | POA: Diagnosis not present

## 2023-03-27 DIAGNOSIS — E8779 Other fluid overload: Secondary | ICD-10-CM | POA: Diagnosis not present

## 2023-03-27 DIAGNOSIS — Z992 Dependence on renal dialysis: Secondary | ICD-10-CM | POA: Diagnosis not present

## 2023-03-29 DIAGNOSIS — E8779 Other fluid overload: Secondary | ICD-10-CM | POA: Diagnosis not present

## 2023-03-29 DIAGNOSIS — Z992 Dependence on renal dialysis: Secondary | ICD-10-CM | POA: Diagnosis not present

## 2023-03-29 DIAGNOSIS — N186 End stage renal disease: Secondary | ICD-10-CM | POA: Diagnosis not present

## 2023-03-29 DIAGNOSIS — N2581 Secondary hyperparathyroidism of renal origin: Secondary | ICD-10-CM | POA: Diagnosis not present

## 2023-03-30 DIAGNOSIS — E8779 Other fluid overload: Secondary | ICD-10-CM | POA: Diagnosis not present

## 2023-03-30 DIAGNOSIS — N2581 Secondary hyperparathyroidism of renal origin: Secondary | ICD-10-CM | POA: Diagnosis not present

## 2023-03-30 DIAGNOSIS — Z992 Dependence on renal dialysis: Secondary | ICD-10-CM | POA: Diagnosis not present

## 2023-03-30 DIAGNOSIS — N186 End stage renal disease: Secondary | ICD-10-CM | POA: Diagnosis not present

## 2023-04-01 DIAGNOSIS — N2581 Secondary hyperparathyroidism of renal origin: Secondary | ICD-10-CM | POA: Diagnosis not present

## 2023-04-01 DIAGNOSIS — N186 End stage renal disease: Secondary | ICD-10-CM | POA: Diagnosis not present

## 2023-04-01 DIAGNOSIS — E8779 Other fluid overload: Secondary | ICD-10-CM | POA: Diagnosis not present

## 2023-04-01 DIAGNOSIS — Z992 Dependence on renal dialysis: Secondary | ICD-10-CM | POA: Diagnosis not present

## 2023-04-03 DIAGNOSIS — Z992 Dependence on renal dialysis: Secondary | ICD-10-CM | POA: Diagnosis not present

## 2023-04-03 DIAGNOSIS — N2581 Secondary hyperparathyroidism of renal origin: Secondary | ICD-10-CM | POA: Diagnosis not present

## 2023-04-03 DIAGNOSIS — N186 End stage renal disease: Secondary | ICD-10-CM | POA: Diagnosis not present

## 2023-04-03 DIAGNOSIS — E8779 Other fluid overload: Secondary | ICD-10-CM | POA: Diagnosis not present

## 2023-04-06 DIAGNOSIS — N2581 Secondary hyperparathyroidism of renal origin: Secondary | ICD-10-CM | POA: Diagnosis not present

## 2023-04-06 DIAGNOSIS — E8779 Other fluid overload: Secondary | ICD-10-CM | POA: Diagnosis not present

## 2023-04-06 DIAGNOSIS — Z992 Dependence on renal dialysis: Secondary | ICD-10-CM | POA: Diagnosis not present

## 2023-04-06 DIAGNOSIS — N186 End stage renal disease: Secondary | ICD-10-CM | POA: Diagnosis not present

## 2023-04-08 DIAGNOSIS — N186 End stage renal disease: Secondary | ICD-10-CM | POA: Diagnosis not present

## 2023-04-08 DIAGNOSIS — Z992 Dependence on renal dialysis: Secondary | ICD-10-CM | POA: Diagnosis not present

## 2023-04-08 DIAGNOSIS — N2581 Secondary hyperparathyroidism of renal origin: Secondary | ICD-10-CM | POA: Diagnosis not present

## 2023-04-08 DIAGNOSIS — E8779 Other fluid overload: Secondary | ICD-10-CM | POA: Diagnosis not present

## 2023-04-10 DIAGNOSIS — N186 End stage renal disease: Secondary | ICD-10-CM | POA: Diagnosis not present

## 2023-04-10 DIAGNOSIS — E8779 Other fluid overload: Secondary | ICD-10-CM | POA: Diagnosis not present

## 2023-04-10 DIAGNOSIS — Z992 Dependence on renal dialysis: Secondary | ICD-10-CM | POA: Diagnosis not present

## 2023-04-10 DIAGNOSIS — N2581 Secondary hyperparathyroidism of renal origin: Secondary | ICD-10-CM | POA: Diagnosis not present

## 2023-04-13 DIAGNOSIS — E8770 Fluid overload, unspecified: Secondary | ICD-10-CM | POA: Diagnosis not present

## 2023-04-13 DIAGNOSIS — N186 End stage renal disease: Secondary | ICD-10-CM | POA: Diagnosis not present

## 2023-04-13 DIAGNOSIS — Z992 Dependence on renal dialysis: Secondary | ICD-10-CM | POA: Diagnosis not present

## 2023-04-13 DIAGNOSIS — I953 Hypotension of hemodialysis: Secondary | ICD-10-CM | POA: Diagnosis not present

## 2023-04-13 DIAGNOSIS — I9589 Other hypotension: Secondary | ICD-10-CM | POA: Diagnosis not present

## 2023-04-13 DIAGNOSIS — E8779 Other fluid overload: Secondary | ICD-10-CM | POA: Diagnosis not present

## 2023-04-15 DIAGNOSIS — I9589 Other hypotension: Secondary | ICD-10-CM | POA: Diagnosis not present

## 2023-04-15 DIAGNOSIS — E8770 Fluid overload, unspecified: Secondary | ICD-10-CM | POA: Diagnosis not present

## 2023-04-15 DIAGNOSIS — I953 Hypotension of hemodialysis: Secondary | ICD-10-CM | POA: Diagnosis not present

## 2023-04-15 DIAGNOSIS — N186 End stage renal disease: Secondary | ICD-10-CM | POA: Diagnosis not present

## 2023-04-15 DIAGNOSIS — Z992 Dependence on renal dialysis: Secondary | ICD-10-CM | POA: Diagnosis not present

## 2023-04-15 DIAGNOSIS — E8779 Other fluid overload: Secondary | ICD-10-CM | POA: Diagnosis not present

## 2023-04-17 DIAGNOSIS — I953 Hypotension of hemodialysis: Secondary | ICD-10-CM | POA: Diagnosis not present

## 2023-04-17 DIAGNOSIS — N186 End stage renal disease: Secondary | ICD-10-CM | POA: Diagnosis not present

## 2023-04-17 DIAGNOSIS — I9589 Other hypotension: Secondary | ICD-10-CM | POA: Diagnosis not present

## 2023-04-17 DIAGNOSIS — Z992 Dependence on renal dialysis: Secondary | ICD-10-CM | POA: Diagnosis not present

## 2023-04-17 DIAGNOSIS — E8779 Other fluid overload: Secondary | ICD-10-CM | POA: Diagnosis not present

## 2023-04-17 DIAGNOSIS — E8770 Fluid overload, unspecified: Secondary | ICD-10-CM | POA: Diagnosis not present

## 2023-04-20 DIAGNOSIS — E8770 Fluid overload, unspecified: Secondary | ICD-10-CM | POA: Diagnosis not present

## 2023-04-20 DIAGNOSIS — I9589 Other hypotension: Secondary | ICD-10-CM | POA: Diagnosis not present

## 2023-04-20 DIAGNOSIS — I953 Hypotension of hemodialysis: Secondary | ICD-10-CM | POA: Diagnosis not present

## 2023-04-20 DIAGNOSIS — Z992 Dependence on renal dialysis: Secondary | ICD-10-CM | POA: Diagnosis not present

## 2023-04-20 DIAGNOSIS — E8779 Other fluid overload: Secondary | ICD-10-CM | POA: Diagnosis not present

## 2023-04-20 DIAGNOSIS — N186 End stage renal disease: Secondary | ICD-10-CM | POA: Diagnosis not present

## 2023-04-22 DIAGNOSIS — I953 Hypotension of hemodialysis: Secondary | ICD-10-CM | POA: Diagnosis not present

## 2023-04-22 DIAGNOSIS — Z992 Dependence on renal dialysis: Secondary | ICD-10-CM | POA: Diagnosis not present

## 2023-04-22 DIAGNOSIS — E8770 Fluid overload, unspecified: Secondary | ICD-10-CM | POA: Diagnosis not present

## 2023-04-22 DIAGNOSIS — I9589 Other hypotension: Secondary | ICD-10-CM | POA: Diagnosis not present

## 2023-04-22 DIAGNOSIS — E8779 Other fluid overload: Secondary | ICD-10-CM | POA: Diagnosis not present

## 2023-04-22 DIAGNOSIS — N186 End stage renal disease: Secondary | ICD-10-CM | POA: Diagnosis not present

## 2023-04-24 DIAGNOSIS — E8770 Fluid overload, unspecified: Secondary | ICD-10-CM | POA: Diagnosis not present

## 2023-04-24 DIAGNOSIS — Z992 Dependence on renal dialysis: Secondary | ICD-10-CM | POA: Diagnosis not present

## 2023-04-24 DIAGNOSIS — E8779 Other fluid overload: Secondary | ICD-10-CM | POA: Diagnosis not present

## 2023-04-24 DIAGNOSIS — I953 Hypotension of hemodialysis: Secondary | ICD-10-CM | POA: Diagnosis not present

## 2023-04-24 DIAGNOSIS — I9589 Other hypotension: Secondary | ICD-10-CM | POA: Diagnosis not present

## 2023-04-24 DIAGNOSIS — N186 End stage renal disease: Secondary | ICD-10-CM | POA: Diagnosis not present

## 2023-04-27 DIAGNOSIS — E8770 Fluid overload, unspecified: Secondary | ICD-10-CM | POA: Diagnosis not present

## 2023-04-27 DIAGNOSIS — Z992 Dependence on renal dialysis: Secondary | ICD-10-CM | POA: Diagnosis not present

## 2023-04-27 DIAGNOSIS — I953 Hypotension of hemodialysis: Secondary | ICD-10-CM | POA: Diagnosis not present

## 2023-04-27 DIAGNOSIS — I9589 Other hypotension: Secondary | ICD-10-CM | POA: Diagnosis not present

## 2023-04-27 DIAGNOSIS — N186 End stage renal disease: Secondary | ICD-10-CM | POA: Diagnosis not present

## 2023-04-27 DIAGNOSIS — E8779 Other fluid overload: Secondary | ICD-10-CM | POA: Diagnosis not present

## 2023-04-28 DIAGNOSIS — E8770 Fluid overload, unspecified: Secondary | ICD-10-CM | POA: Diagnosis not present

## 2023-04-28 DIAGNOSIS — I953 Hypotension of hemodialysis: Secondary | ICD-10-CM | POA: Diagnosis not present

## 2023-04-28 DIAGNOSIS — N186 End stage renal disease: Secondary | ICD-10-CM | POA: Diagnosis not present

## 2023-04-28 DIAGNOSIS — E8779 Other fluid overload: Secondary | ICD-10-CM | POA: Diagnosis not present

## 2023-04-28 DIAGNOSIS — Z992 Dependence on renal dialysis: Secondary | ICD-10-CM | POA: Diagnosis not present

## 2023-04-28 DIAGNOSIS — I9589 Other hypotension: Secondary | ICD-10-CM | POA: Diagnosis not present

## 2023-04-29 DIAGNOSIS — N186 End stage renal disease: Secondary | ICD-10-CM | POA: Diagnosis not present

## 2023-04-29 DIAGNOSIS — I9589 Other hypotension: Secondary | ICD-10-CM | POA: Diagnosis not present

## 2023-04-29 DIAGNOSIS — I953 Hypotension of hemodialysis: Secondary | ICD-10-CM | POA: Diagnosis not present

## 2023-04-29 DIAGNOSIS — E8770 Fluid overload, unspecified: Secondary | ICD-10-CM | POA: Diagnosis not present

## 2023-04-29 DIAGNOSIS — Z992 Dependence on renal dialysis: Secondary | ICD-10-CM | POA: Diagnosis not present

## 2023-04-29 DIAGNOSIS — E8779 Other fluid overload: Secondary | ICD-10-CM | POA: Diagnosis not present

## 2023-05-01 DIAGNOSIS — N186 End stage renal disease: Secondary | ICD-10-CM | POA: Diagnosis not present

## 2023-05-01 DIAGNOSIS — E8779 Other fluid overload: Secondary | ICD-10-CM | POA: Diagnosis not present

## 2023-05-01 DIAGNOSIS — E8770 Fluid overload, unspecified: Secondary | ICD-10-CM | POA: Diagnosis not present

## 2023-05-01 DIAGNOSIS — Z992 Dependence on renal dialysis: Secondary | ICD-10-CM | POA: Diagnosis not present

## 2023-05-01 DIAGNOSIS — I9589 Other hypotension: Secondary | ICD-10-CM | POA: Diagnosis not present

## 2023-05-01 DIAGNOSIS — I953 Hypotension of hemodialysis: Secondary | ICD-10-CM | POA: Diagnosis not present

## 2023-05-03 DIAGNOSIS — C61 Malignant neoplasm of prostate: Secondary | ICD-10-CM | POA: Diagnosis not present

## 2023-05-04 DIAGNOSIS — N186 End stage renal disease: Secondary | ICD-10-CM | POA: Diagnosis not present

## 2023-05-04 DIAGNOSIS — I953 Hypotension of hemodialysis: Secondary | ICD-10-CM | POA: Diagnosis not present

## 2023-05-04 DIAGNOSIS — E8770 Fluid overload, unspecified: Secondary | ICD-10-CM | POA: Diagnosis not present

## 2023-05-04 DIAGNOSIS — I9589 Other hypotension: Secondary | ICD-10-CM | POA: Diagnosis not present

## 2023-05-04 DIAGNOSIS — E8779 Other fluid overload: Secondary | ICD-10-CM | POA: Diagnosis not present

## 2023-05-04 DIAGNOSIS — Z992 Dependence on renal dialysis: Secondary | ICD-10-CM | POA: Diagnosis not present

## 2023-05-06 DIAGNOSIS — N186 End stage renal disease: Secondary | ICD-10-CM | POA: Diagnosis not present

## 2023-05-06 DIAGNOSIS — E8779 Other fluid overload: Secondary | ICD-10-CM | POA: Diagnosis not present

## 2023-05-06 DIAGNOSIS — Z992 Dependence on renal dialysis: Secondary | ICD-10-CM | POA: Diagnosis not present

## 2023-05-06 DIAGNOSIS — I9589 Other hypotension: Secondary | ICD-10-CM | POA: Diagnosis not present

## 2023-05-06 DIAGNOSIS — E8770 Fluid overload, unspecified: Secondary | ICD-10-CM | POA: Diagnosis not present

## 2023-05-06 DIAGNOSIS — I953 Hypotension of hemodialysis: Secondary | ICD-10-CM | POA: Diagnosis not present

## 2023-05-07 DIAGNOSIS — I9589 Other hypotension: Secondary | ICD-10-CM | POA: Diagnosis not present

## 2023-05-07 DIAGNOSIS — E8770 Fluid overload, unspecified: Secondary | ICD-10-CM | POA: Diagnosis not present

## 2023-05-07 DIAGNOSIS — Z992 Dependence on renal dialysis: Secondary | ICD-10-CM | POA: Diagnosis not present

## 2023-05-07 DIAGNOSIS — N186 End stage renal disease: Secondary | ICD-10-CM | POA: Diagnosis not present

## 2023-05-07 DIAGNOSIS — I953 Hypotension of hemodialysis: Secondary | ICD-10-CM | POA: Diagnosis not present

## 2023-05-07 DIAGNOSIS — E8779 Other fluid overload: Secondary | ICD-10-CM | POA: Diagnosis not present

## 2023-05-08 DIAGNOSIS — I9589 Other hypotension: Secondary | ICD-10-CM | POA: Diagnosis not present

## 2023-05-08 DIAGNOSIS — I953 Hypotension of hemodialysis: Secondary | ICD-10-CM | POA: Diagnosis not present

## 2023-05-08 DIAGNOSIS — E8770 Fluid overload, unspecified: Secondary | ICD-10-CM | POA: Diagnosis not present

## 2023-05-08 DIAGNOSIS — N186 End stage renal disease: Secondary | ICD-10-CM | POA: Diagnosis not present

## 2023-05-08 DIAGNOSIS — E8779 Other fluid overload: Secondary | ICD-10-CM | POA: Diagnosis not present

## 2023-05-08 DIAGNOSIS — Z992 Dependence on renal dialysis: Secondary | ICD-10-CM | POA: Diagnosis not present

## 2023-05-10 DIAGNOSIS — N186 End stage renal disease: Secondary | ICD-10-CM | POA: Diagnosis not present

## 2023-05-10 DIAGNOSIS — Z992 Dependence on renal dialysis: Secondary | ICD-10-CM | POA: Diagnosis not present

## 2023-05-11 DIAGNOSIS — T82898A Other specified complication of vascular prosthetic devices, implants and grafts, initial encounter: Secondary | ICD-10-CM | POA: Diagnosis not present

## 2023-05-11 DIAGNOSIS — Z992 Dependence on renal dialysis: Secondary | ICD-10-CM | POA: Diagnosis not present

## 2023-05-11 DIAGNOSIS — N186 End stage renal disease: Secondary | ICD-10-CM | POA: Diagnosis not present

## 2023-05-11 DIAGNOSIS — T82848A Pain from vascular prosthetic devices, implants and grafts, initial encounter: Secondary | ICD-10-CM | POA: Diagnosis not present

## 2023-05-13 DIAGNOSIS — T82848A Pain from vascular prosthetic devices, implants and grafts, initial encounter: Secondary | ICD-10-CM | POA: Diagnosis not present

## 2023-05-13 DIAGNOSIS — T82898A Other specified complication of vascular prosthetic devices, implants and grafts, initial encounter: Secondary | ICD-10-CM | POA: Diagnosis not present

## 2023-05-13 DIAGNOSIS — N186 End stage renal disease: Secondary | ICD-10-CM | POA: Diagnosis not present

## 2023-05-13 DIAGNOSIS — Z992 Dependence on renal dialysis: Secondary | ICD-10-CM | POA: Diagnosis not present

## 2023-05-15 DIAGNOSIS — T82898A Other specified complication of vascular prosthetic devices, implants and grafts, initial encounter: Secondary | ICD-10-CM | POA: Diagnosis not present

## 2023-05-15 DIAGNOSIS — T82848A Pain from vascular prosthetic devices, implants and grafts, initial encounter: Secondary | ICD-10-CM | POA: Diagnosis not present

## 2023-05-15 DIAGNOSIS — N186 End stage renal disease: Secondary | ICD-10-CM | POA: Diagnosis not present

## 2023-05-15 DIAGNOSIS — Z992 Dependence on renal dialysis: Secondary | ICD-10-CM | POA: Diagnosis not present

## 2023-05-18 DIAGNOSIS — T82898A Other specified complication of vascular prosthetic devices, implants and grafts, initial encounter: Secondary | ICD-10-CM | POA: Diagnosis not present

## 2023-05-18 DIAGNOSIS — Z992 Dependence on renal dialysis: Secondary | ICD-10-CM | POA: Diagnosis not present

## 2023-05-18 DIAGNOSIS — T82848A Pain from vascular prosthetic devices, implants and grafts, initial encounter: Secondary | ICD-10-CM | POA: Diagnosis not present

## 2023-05-18 DIAGNOSIS — N186 End stage renal disease: Secondary | ICD-10-CM | POA: Diagnosis not present

## 2023-05-20 DIAGNOSIS — N186 End stage renal disease: Secondary | ICD-10-CM | POA: Diagnosis not present

## 2023-05-20 DIAGNOSIS — Z992 Dependence on renal dialysis: Secondary | ICD-10-CM | POA: Diagnosis not present

## 2023-05-20 DIAGNOSIS — T82898A Other specified complication of vascular prosthetic devices, implants and grafts, initial encounter: Secondary | ICD-10-CM | POA: Diagnosis not present

## 2023-05-20 DIAGNOSIS — T82848A Pain from vascular prosthetic devices, implants and grafts, initial encounter: Secondary | ICD-10-CM | POA: Diagnosis not present

## 2023-05-22 DIAGNOSIS — T82848A Pain from vascular prosthetic devices, implants and grafts, initial encounter: Secondary | ICD-10-CM | POA: Diagnosis not present

## 2023-05-22 DIAGNOSIS — Z992 Dependence on renal dialysis: Secondary | ICD-10-CM | POA: Diagnosis not present

## 2023-05-22 DIAGNOSIS — N186 End stage renal disease: Secondary | ICD-10-CM | POA: Diagnosis not present

## 2023-05-22 DIAGNOSIS — T82898A Other specified complication of vascular prosthetic devices, implants and grafts, initial encounter: Secondary | ICD-10-CM | POA: Diagnosis not present

## 2023-05-25 DIAGNOSIS — T82898A Other specified complication of vascular prosthetic devices, implants and grafts, initial encounter: Secondary | ICD-10-CM | POA: Diagnosis not present

## 2023-05-25 DIAGNOSIS — Z992 Dependence on renal dialysis: Secondary | ICD-10-CM | POA: Diagnosis not present

## 2023-05-25 DIAGNOSIS — T82848A Pain from vascular prosthetic devices, implants and grafts, initial encounter: Secondary | ICD-10-CM | POA: Diagnosis not present

## 2023-05-25 DIAGNOSIS — N186 End stage renal disease: Secondary | ICD-10-CM | POA: Diagnosis not present

## 2023-05-27 DIAGNOSIS — Z992 Dependence on renal dialysis: Secondary | ICD-10-CM | POA: Diagnosis not present

## 2023-05-27 DIAGNOSIS — T82848A Pain from vascular prosthetic devices, implants and grafts, initial encounter: Secondary | ICD-10-CM | POA: Diagnosis not present

## 2023-05-27 DIAGNOSIS — N186 End stage renal disease: Secondary | ICD-10-CM | POA: Diagnosis not present

## 2023-05-27 DIAGNOSIS — T82898A Other specified complication of vascular prosthetic devices, implants and grafts, initial encounter: Secondary | ICD-10-CM | POA: Diagnosis not present

## 2023-05-29 DIAGNOSIS — N186 End stage renal disease: Secondary | ICD-10-CM | POA: Diagnosis not present

## 2023-05-29 DIAGNOSIS — T82898A Other specified complication of vascular prosthetic devices, implants and grafts, initial encounter: Secondary | ICD-10-CM | POA: Diagnosis not present

## 2023-05-29 DIAGNOSIS — Z992 Dependence on renal dialysis: Secondary | ICD-10-CM | POA: Diagnosis not present

## 2023-05-29 DIAGNOSIS — T82848A Pain from vascular prosthetic devices, implants and grafts, initial encounter: Secondary | ICD-10-CM | POA: Diagnosis not present

## 2023-06-01 DIAGNOSIS — T82898A Other specified complication of vascular prosthetic devices, implants and grafts, initial encounter: Secondary | ICD-10-CM | POA: Diagnosis not present

## 2023-06-01 DIAGNOSIS — T82848A Pain from vascular prosthetic devices, implants and grafts, initial encounter: Secondary | ICD-10-CM | POA: Diagnosis not present

## 2023-06-01 DIAGNOSIS — Z992 Dependence on renal dialysis: Secondary | ICD-10-CM | POA: Diagnosis not present

## 2023-06-01 DIAGNOSIS — N186 End stage renal disease: Secondary | ICD-10-CM | POA: Diagnosis not present

## 2023-06-02 ENCOUNTER — Ambulatory Visit (INDEPENDENT_AMBULATORY_CARE_PROVIDER_SITE_OTHER): Payer: Medicare HMO | Admitting: Internal Medicine

## 2023-06-02 ENCOUNTER — Encounter: Payer: Self-pay | Admitting: Internal Medicine

## 2023-06-02 VITALS — BP 118/70 | HR 97 | Ht 71.0 in | Wt 293.0 lb

## 2023-06-02 DIAGNOSIS — C61 Malignant neoplasm of prostate: Secondary | ICD-10-CM

## 2023-06-02 DIAGNOSIS — E119 Type 2 diabetes mellitus without complications: Secondary | ICD-10-CM | POA: Diagnosis not present

## 2023-06-02 DIAGNOSIS — I10 Essential (primary) hypertension: Secondary | ICD-10-CM

## 2023-06-02 DIAGNOSIS — I7789 Other specified disorders of arteries and arterioles: Secondary | ICD-10-CM

## 2023-06-02 DIAGNOSIS — N186 End stage renal disease: Secondary | ICD-10-CM | POA: Diagnosis not present

## 2023-06-02 NOTE — Assessment & Plan Note (Signed)
Tolerating HD three times per week at [facility]

## 2023-06-02 NOTE — Assessment & Plan Note (Addendum)
Nov 2023 scans without evidence for metastatic disease.  He is asymptomatic and depending on updated findings is generally most comfortable with continuing the observation without intervention (aggressive local therapy with thermal ablation had been offered).  Oncology has concerns about what a solid organ transplant immunosuppression regimen would do to his cancer status. transplant to be, generally, at odds with cancer control.  PSA 9/24 = 15

## 2023-06-02 NOTE — Assessment & Plan Note (Addendum)
Normal exam with stable BP on Coreg, amlodipine and Lasix.  Holds Coreg and Lasix before HD.  Also has midodrine to use during HD if BP drops. No concerns or side effects to current medication. No change in regimen; continue low sodium diet.

## 2023-06-02 NOTE — Progress Notes (Signed)
Date:  06/02/2023   Name:  Benjamin Stewart   DOB:  1976/03/25   MRN:  [id number]   Chief Complaint: Hypertension  Hypertension This is a chronic problem. The problem is controlled. Pertinent negatives include no chest pain, headaches, palpitations or shortness of breath. Past treatments include beta blockers, diuretics and calcium channel blockers. The current treatment provides significant improvement. Hypertensive end-organ damage includes kidney disease. There is no history of CAD/MI or CVA.    Review of Systems  Constitutional:  Negative for fatigue and unexpected weight change.  HENT:  Negative for nosebleeds.   Eyes:  Negative for visual disturbance.  Respiratory:  Negative for cough, chest tightness, shortness of breath and wheezing.   Cardiovascular:  Negative for chest pain, palpitations and leg swelling.  Gastrointestinal:  Negative for abdominal pain, constipation and diarrhea.  Neurological:  Negative for dizziness, weakness, light-headedness and headaches.     Lab Results  Component Value Date   NA 138 11/30/2022   K 5.7 (H) 11/30/2022   CO2 18 (L) 11/30/2022   GLUCOSE 98 11/30/2022   BUN 66 (H) 11/30/2022   CREATININE 12.17 (H) 11/30/2022   CALCIUM 9.1 11/30/2022   EGFR 5 (L) 11/30/2022   GFRNONAA 5 (L) 05/07/2021   Lab Results  Component Value Date   CHOL 118 11/30/2022   HDL 35 (L) 11/30/2022   LDLCALC 65 11/30/2022   TRIG 92 11/30/2022   CHOLHDL 3.4 11/30/2022   Lab Results  Component Value Date   TSH 1.173 11/09/2019   Lab Results  Component Value Date   HGBA1C 5.9 (H) 11/30/2022   Lab Results  Component Value Date   WBC 9.6 11/30/2022   HGB 16.1 11/30/2022   HCT 47.7 11/30/2022   MCV 91 11/30/2022   PLT 309 11/30/2022   Lab Results  Component Value Date   ALT 7 11/30/2022   AST 9 11/30/2022   ALKPHOS 80 11/30/2022   BILITOT 0.6 11/30/2022   No results found for: "25OHVITD2", "25OHVITD3", "VD25OH"   Patient Active Problem List    Diagnosis Date Noted   Arteriovenous fistula, acquired (HCC) 02/03/2023   Calculus of gallbladder and bile duct without cholecystitis or obstruction 02/03/2023   End stage renal disease (HCC) 04/11/2020   Secondary hyperparathyroidism of renal origin (HCC) 04/24/2019   Hyperkalemia 04/24/2019   Localized edema 01/19/2019   Chronic gout of multiple sites 08/16/2018   Prostate cancer (HCC) 08/23/2015   Hyperlipidemia, mixed 07/16/2015   Essential (primary) hypertension 12/04/2014    Allergies  Allergen Reactions   Glimepiride Other (See Comments)    Severe hypoglycemia   Other    Percocet [Oxycodone-Acetaminophen] Nausea And Vomiting    Past Surgical History:  Procedure Laterality Date   A/V FISTULAGRAM Left 05/30/2020   Procedure: A/V FISTULAGRAM;  Surgeon: Annice Needy, MD;  Location: [facility];  Service: Cardiovascular;  Laterality: Left;   AV FISTULA PLACEMENT Left 03/07/2020   Procedure: ARTERIOVENOUS (AV) FISTULA CREATION BRACHIAL CEPHALIC;  Surgeon: Annice Needy, MD;  Location: [facility];  Service: Vascular;  Laterality: Left;   DIALYSIS/PERMA CATHETER INSERTION N/A 01/17/2020   Procedure: DIALYSIS/PERMA CATHETER INSERTION;  Surgeon: Annice Needy, MD;  Location: [facility];  Service: Cardiovascular;  Laterality: N/A;   DIALYSIS/PERMA CATHETER REMOVAL N/A 10/07/2020   Procedure: DIALYSIS/PERMA CATHETER REMOVAL;  Surgeon: Annice Needy, MD;  Location: [facility];  Service: Cardiovascular;  Laterality: N/A;   PARTIAL COLECTOMY  2005  RADIOACTIVE SEED IMPLANT N/A 12/06/2017   Procedure: RADIOACTIVE SEED IMPLANT/BRACHYTHERAPY IMPLANT;  Surgeon: Vanna Scotland, MD;  Location: [facility];  Service: Urology;  Laterality: N/A;   UPPER EXTREMITY ANGIOGRAPHY Left 04/18/2020   Procedure: UPPER EXTREMITY ANGIOGRAPHY;  Surgeon: Annice Needy, MD;  Location: [facility];  Service: Cardiovascular;  Laterality: Left;    Social History   Tobacco Use   Smoking  status: Never   Smokeless tobacco: Never  Vaping Use   Vaping status: Never Used  Substance Use Topics   Alcohol use: No    Alcohol/week: 0.0 standard drinks of alcohol   Drug use: No     Medication list has been reviewed and updated.  Current Meds  Medication Sig   amLODipine (NORVASC) 10 MG tablet 10 mg daily.   ASPIRIN LOW DOSE 81 MG tablet TAKE 1 TABLET BY MOUTH DAILY   atorvastatin (LIPITOR) 10 MG tablet Take 1 tablet (10 mg total) by mouth daily.   carvedilol (COREG) 6.25 MG tablet Take 6.25 mg by mouth 2 (two) times daily with a meal.    cinacalcet (SENSIPAR) 30 MG tablet Take 30 mg by mouth daily.   clopidogrel (PLAVIX) 75 MG tablet TAKE 1 TABLET(75 MG) BY MOUTH DAILY   furosemide (LASIX) 80 MG tablet Take 80 mg by mouth daily.   midodrine (PROAMATINE) 5 MG tablet Take 5 mg by mouth 2 (two) times daily with a meal.   mometasone (ELOCON) 0.1 % cream Apply topically daily.   sevelamer carbonate (RENVELA) 800 MG tablet Take 800 mg by mouth 3 (three) times daily with meals.   XPHOZAH 30 MG TABS Take by mouth.       02/03/2023    1:41 PM 11/30/2022    9:12 AM 11/26/2021    8:46 AM 05/23/2021    1:55 PM  GAD 7 : Generalized Anxiety Score  Nervous, Anxious, on Edge 0 0 0 0  Control/stop worrying 0 0 0 0  Worry too much - different things 0 0 0 0  Trouble relaxing 0 0 0 0  Restless 0 0 0 0  Easily annoyed or irritable 0 0 0 0  Afraid - awful might happen 0 0 0 0  Total GAD 7 Score 0 0 0 0  Anxiety Difficulty Not difficult at all Not difficult at all Not difficult at all Not difficult at all       02/03/2023    1:41 PM 11/30/2022    9:12 AM 11/26/2021    8:45 AM  Depression screen PHQ 2/9  Decreased Interest 0 0 0  Down, Depressed, Hopeless 0 0 0  PHQ - 2 Score 0 0 0  Altered sleeping 0 0 0  Tired, decreased energy 0 0 0  Change in appetite 0 0 0  Feeling bad or failure about yourself  0 0 0  Trouble concentrating 0 0 0  Moving slowly or fidgety/restless 0 0 0   Suicidal thoughts 0 0 0  PHQ-9 Score 0 0 0  Difficult doing work/chores Not difficult at all Not difficult at all Not difficult at all    BP Readings from Last 3 Encounters:  06/02/23 118/70  03/26/23 (!) 128/93  02/03/23 128/82    Physical Exam Vitals and nursing note reviewed.  Constitutional:      General: He is not in acute distress.    Appearance: He is well-developed.  HENT:     Head: Normocephalic and atraumatic.  Neck:     Vascular: No  carotid bruit.  Cardiovascular:     Rate and Rhythm: Normal rate and regular rhythm.     Pulses: Normal pulses.  Pulmonary:     Effort: Pulmonary effort is normal. No respiratory distress.     Breath sounds: No wheezing or rhonchi.  Musculoskeletal:     Right lower leg: No edema.     Left lower leg: No edema.  Lymphadenopathy:     Cervical: No cervical adenopathy.  Skin:    General: Skin is warm and dry.     Findings: No rash.  Neurological:     Mental Status: He is alert and oriented to person, place, and time.  Psychiatric:        Mood and Affect: Mood normal.        Behavior: Behavior normal.     Wt Readings from Last 3 Encounters:  06/02/23 293 lb (132.9 kg)  03/26/23 291 lb 12.8 oz (132.4 kg)  02/03/23 286 lb (129.7 kg)    BP 118/70   Pulse 97   Ht 5\' 11"  (1.803 m)   Wt 293 lb (132.9 kg)   SpO2 98%   BMI 40.87 kg/m   Assessment and Plan:  Problem List Items Addressed This Visit       Unprioritized   End stage renal disease (HCC) (Chronic)    Tolerating HD three times per week at [facility]      Essential (primary) hypertension - Primary (Chronic)    Normal exam with stable BP on Coreg, amlodipine and Lasix.  Holds Coreg and Lasix before HD.  Also has midodrine to use during HD if BP drops. No concerns or side effects to current medication. No change in regimen; continue low sodium diet.       Prostate cancer [facility] (Chronic)    Nov 2023 scans without evidence for metastatic disease.  He is  asymptomatic and depending on updated findings is generally most comfortable with continuing the observation without intervention (aggressive local therapy with thermal ablation had been offered).  Oncology has concerns about what a solid organ transplant immunosuppression regimen would do to his cancer status. transplant to be, generally, at odds with cancer control.  PSA 9/24 = 15      Other Visit Diagnoses     Arterial steal syndrome (HCC)   (Chronic)         Return in about 6 months (around 12/01/2023) for CPX.    Reubin Milan, MD [facility] [place]

## 2023-06-03 DIAGNOSIS — T82868A Thrombosis of vascular prosthetic devices, implants and grafts, initial encounter: Secondary | ICD-10-CM | POA: Diagnosis not present

## 2023-06-03 DIAGNOSIS — T82858A Stenosis of vascular prosthetic devices, implants and grafts, initial encounter: Secondary | ICD-10-CM | POA: Diagnosis not present

## 2023-06-03 DIAGNOSIS — I871 Compression of vein: Secondary | ICD-10-CM | POA: Diagnosis not present

## 2023-06-03 DIAGNOSIS — N186 End stage renal disease: Secondary | ICD-10-CM | POA: Diagnosis not present

## 2023-06-03 DIAGNOSIS — Z992 Dependence on renal dialysis: Secondary | ICD-10-CM | POA: Diagnosis not present

## 2023-06-04 DIAGNOSIS — D631 Anemia in chronic kidney disease: Secondary | ICD-10-CM | POA: Diagnosis not present

## 2023-06-04 DIAGNOSIS — I120 Hypertensive chronic kidney disease with stage 5 chronic kidney disease or end stage renal disease: Secondary | ICD-10-CM | POA: Diagnosis not present

## 2023-06-04 DIAGNOSIS — Z4901 Encounter for fitting and adjustment of extracorporeal dialysis catheter: Secondary | ICD-10-CM | POA: Diagnosis not present

## 2023-06-04 DIAGNOSIS — M1030 Gout due to renal impairment, unspecified site: Secondary | ICD-10-CM | POA: Diagnosis not present

## 2023-06-04 DIAGNOSIS — T82868A Thrombosis of vascular prosthetic devices, implants and grafts, initial encounter: Secondary | ICD-10-CM | POA: Diagnosis not present

## 2023-06-04 DIAGNOSIS — E1122 Type 2 diabetes mellitus with diabetic chronic kidney disease: Secondary | ICD-10-CM | POA: Diagnosis not present

## 2023-06-04 DIAGNOSIS — N186 End stage renal disease: Secondary | ICD-10-CM | POA: Diagnosis not present

## 2023-06-05 DIAGNOSIS — T82848A Pain from vascular prosthetic devices, implants and grafts, initial encounter: Secondary | ICD-10-CM | POA: Diagnosis not present

## 2023-06-05 DIAGNOSIS — Z992 Dependence on renal dialysis: Secondary | ICD-10-CM | POA: Diagnosis not present

## 2023-06-05 DIAGNOSIS — T82898A Other specified complication of vascular prosthetic devices, implants and grafts, initial encounter: Secondary | ICD-10-CM | POA: Diagnosis not present

## 2023-06-05 DIAGNOSIS — N186 End stage renal disease: Secondary | ICD-10-CM | POA: Diagnosis not present

## 2023-06-08 DIAGNOSIS — Z992 Dependence on renal dialysis: Secondary | ICD-10-CM | POA: Diagnosis not present

## 2023-06-08 DIAGNOSIS — T82848A Pain from vascular prosthetic devices, implants and grafts, initial encounter: Secondary | ICD-10-CM | POA: Diagnosis not present

## 2023-06-08 DIAGNOSIS — T82898A Other specified complication of vascular prosthetic devices, implants and grafts, initial encounter: Secondary | ICD-10-CM | POA: Diagnosis not present

## 2023-06-08 DIAGNOSIS — N186 End stage renal disease: Secondary | ICD-10-CM | POA: Diagnosis not present

## 2023-06-10 DIAGNOSIS — N186 End stage renal disease: Secondary | ICD-10-CM | POA: Diagnosis not present

## 2023-06-10 DIAGNOSIS — Z992 Dependence on renal dialysis: Secondary | ICD-10-CM | POA: Diagnosis not present

## 2023-06-10 DIAGNOSIS — T82898A Other specified complication of vascular prosthetic devices, implants and grafts, initial encounter: Secondary | ICD-10-CM | POA: Diagnosis not present

## 2023-06-10 DIAGNOSIS — T82848A Pain from vascular prosthetic devices, implants and grafts, initial encounter: Secondary | ICD-10-CM | POA: Diagnosis not present

## 2023-06-11 DIAGNOSIS — E1122 Type 2 diabetes mellitus with diabetic chronic kidney disease: Secondary | ICD-10-CM | POA: Diagnosis not present

## 2023-06-11 DIAGNOSIS — N186 End stage renal disease: Secondary | ICD-10-CM | POA: Diagnosis not present

## 2023-06-11 DIAGNOSIS — I120 Hypertensive chronic kidney disease with stage 5 chronic kidney disease or end stage renal disease: Secondary | ICD-10-CM | POA: Diagnosis not present

## 2023-06-11 DIAGNOSIS — Z992 Dependence on renal dialysis: Secondary | ICD-10-CM | POA: Diagnosis not present

## 2023-06-11 DIAGNOSIS — E785 Hyperlipidemia, unspecified: Secondary | ICD-10-CM | POA: Diagnosis not present

## 2023-06-11 DIAGNOSIS — M1030 Gout due to renal impairment, unspecified site: Secondary | ICD-10-CM | POA: Diagnosis not present

## 2023-06-12 DIAGNOSIS — Z992 Dependence on renal dialysis: Secondary | ICD-10-CM | POA: Diagnosis not present

## 2023-06-12 DIAGNOSIS — N186 End stage renal disease: Secondary | ICD-10-CM | POA: Diagnosis not present

## 2023-06-15 DIAGNOSIS — N186 End stage renal disease: Secondary | ICD-10-CM | POA: Diagnosis not present

## 2023-06-15 DIAGNOSIS — Z992 Dependence on renal dialysis: Secondary | ICD-10-CM | POA: Diagnosis not present

## 2023-06-17 DIAGNOSIS — N186 End stage renal disease: Secondary | ICD-10-CM | POA: Diagnosis not present

## 2023-06-17 DIAGNOSIS — Z992 Dependence on renal dialysis: Secondary | ICD-10-CM | POA: Diagnosis not present

## 2023-06-19 DIAGNOSIS — Z992 Dependence on renal dialysis: Secondary | ICD-10-CM | POA: Diagnosis not present

## 2023-06-19 DIAGNOSIS — N186 End stage renal disease: Secondary | ICD-10-CM | POA: Diagnosis not present

## 2023-06-22 DIAGNOSIS — Z992 Dependence on renal dialysis: Secondary | ICD-10-CM | POA: Diagnosis not present

## 2023-06-22 DIAGNOSIS — N186 End stage renal disease: Secondary | ICD-10-CM | POA: Diagnosis not present

## 2023-06-24 DIAGNOSIS — Z992 Dependence on renal dialysis: Secondary | ICD-10-CM | POA: Diagnosis not present

## 2023-06-24 DIAGNOSIS — N186 End stage renal disease: Secondary | ICD-10-CM | POA: Diagnosis not present

## 2023-06-26 DIAGNOSIS — Z992 Dependence on renal dialysis: Secondary | ICD-10-CM | POA: Diagnosis not present

## 2023-06-26 DIAGNOSIS — N186 End stage renal disease: Secondary | ICD-10-CM | POA: Diagnosis not present

## 2023-06-29 DIAGNOSIS — N186 End stage renal disease: Secondary | ICD-10-CM | POA: Diagnosis not present

## 2023-06-29 DIAGNOSIS — Z992 Dependence on renal dialysis: Secondary | ICD-10-CM | POA: Diagnosis not present

## 2023-07-01 DIAGNOSIS — Z992 Dependence on renal dialysis: Secondary | ICD-10-CM | POA: Diagnosis not present

## 2023-07-01 DIAGNOSIS — N186 End stage renal disease: Secondary | ICD-10-CM | POA: Diagnosis not present

## 2023-07-03 DIAGNOSIS — Z992 Dependence on renal dialysis: Secondary | ICD-10-CM | POA: Diagnosis not present

## 2023-07-03 DIAGNOSIS — N186 End stage renal disease: Secondary | ICD-10-CM | POA: Diagnosis not present

## 2023-07-05 DIAGNOSIS — N186 End stage renal disease: Secondary | ICD-10-CM | POA: Diagnosis not present

## 2023-07-05 DIAGNOSIS — Z992 Dependence on renal dialysis: Secondary | ICD-10-CM | POA: Diagnosis not present

## 2023-07-07 DIAGNOSIS — N186 End stage renal disease: Secondary | ICD-10-CM | POA: Diagnosis not present

## 2023-07-07 DIAGNOSIS — Z992 Dependence on renal dialysis: Secondary | ICD-10-CM | POA: Diagnosis not present

## 2023-07-10 DIAGNOSIS — Z992 Dependence on renal dialysis: Secondary | ICD-10-CM | POA: Diagnosis not present

## 2023-07-10 DIAGNOSIS — N186 End stage renal disease: Secondary | ICD-10-CM | POA: Diagnosis not present

## 2023-07-13 DIAGNOSIS — Z992 Dependence on renal dialysis: Secondary | ICD-10-CM | POA: Diagnosis not present

## 2023-07-13 DIAGNOSIS — N186 End stage renal disease: Secondary | ICD-10-CM | POA: Diagnosis not present

## 2023-07-15 DIAGNOSIS — N186 End stage renal disease: Secondary | ICD-10-CM | POA: Diagnosis not present

## 2023-07-15 DIAGNOSIS — Z992 Dependence on renal dialysis: Secondary | ICD-10-CM | POA: Diagnosis not present

## 2023-07-17 DIAGNOSIS — N186 End stage renal disease: Secondary | ICD-10-CM | POA: Diagnosis not present

## 2023-07-17 DIAGNOSIS — Z992 Dependence on renal dialysis: Secondary | ICD-10-CM | POA: Diagnosis not present

## 2023-07-19 DIAGNOSIS — C61 Malignant neoplasm of prostate: Secondary | ICD-10-CM | POA: Diagnosis not present

## 2023-07-19 LAB — HEPATIC FUNCTION PANEL
ALT: 14 U/L (ref 10–40)
AST: 14 (ref 14–40)
Alkaline Phosphatase: 62 (ref 25–125)

## 2023-07-19 LAB — CBC AND DIFFERENTIAL
HCT: 42 (ref 41–53)
Hemoglobin: 12.9 — AB (ref 13.5–17.5)
Platelets: 345 10*3/uL (ref 150–400)
WBC: 9.4

## 2023-07-19 LAB — PSA: PSA: 12.62

## 2023-07-20 DIAGNOSIS — N186 End stage renal disease: Secondary | ICD-10-CM | POA: Diagnosis not present

## 2023-07-20 DIAGNOSIS — Z992 Dependence on renal dialysis: Secondary | ICD-10-CM | POA: Diagnosis not present

## 2023-07-22 DIAGNOSIS — Z992 Dependence on renal dialysis: Secondary | ICD-10-CM | POA: Diagnosis not present

## 2023-07-22 DIAGNOSIS — N186 End stage renal disease: Secondary | ICD-10-CM | POA: Diagnosis not present

## 2023-07-24 DIAGNOSIS — Z992 Dependence on renal dialysis: Secondary | ICD-10-CM | POA: Diagnosis not present

## 2023-07-24 DIAGNOSIS — N186 End stage renal disease: Secondary | ICD-10-CM | POA: Diagnosis not present

## 2023-07-26 DIAGNOSIS — Z992 Dependence on renal dialysis: Secondary | ICD-10-CM | POA: Diagnosis not present

## 2023-07-26 DIAGNOSIS — N186 End stage renal disease: Secondary | ICD-10-CM | POA: Diagnosis not present

## 2023-07-27 DIAGNOSIS — Z8546 Personal history of malignant neoplasm of prostate: Secondary | ICD-10-CM | POA: Diagnosis not present

## 2023-07-27 DIAGNOSIS — T82868A Thrombosis of vascular prosthetic devices, implants and grafts, initial encounter: Secondary | ICD-10-CM | POA: Diagnosis not present

## 2023-07-27 DIAGNOSIS — Z992 Dependence on renal dialysis: Secondary | ICD-10-CM | POA: Diagnosis not present

## 2023-07-27 DIAGNOSIS — T82898A Other specified complication of vascular prosthetic devices, implants and grafts, initial encounter: Secondary | ICD-10-CM | POA: Diagnosis not present

## 2023-07-27 DIAGNOSIS — Z79899 Other long term (current) drug therapy: Secondary | ICD-10-CM | POA: Diagnosis not present

## 2023-07-27 DIAGNOSIS — Z01818 Encounter for other preprocedural examination: Secondary | ICD-10-CM | POA: Diagnosis not present

## 2023-07-27 DIAGNOSIS — E1122 Type 2 diabetes mellitus with diabetic chronic kidney disease: Secondary | ICD-10-CM | POA: Diagnosis not present

## 2023-07-27 DIAGNOSIS — I120 Hypertensive chronic kidney disease with stage 5 chronic kidney disease or end stage renal disease: Secondary | ICD-10-CM | POA: Diagnosis not present

## 2023-07-27 DIAGNOSIS — Z7982 Long term (current) use of aspirin: Secondary | ICD-10-CM | POA: Diagnosis not present

## 2023-07-27 DIAGNOSIS — N186 End stage renal disease: Secondary | ICD-10-CM | POA: Diagnosis not present

## 2023-07-29 DIAGNOSIS — Z992 Dependence on renal dialysis: Secondary | ICD-10-CM | POA: Diagnosis not present

## 2023-07-29 DIAGNOSIS — N186 End stage renal disease: Secondary | ICD-10-CM | POA: Diagnosis not present

## 2023-07-30 ENCOUNTER — Other Ambulatory Visit (INDEPENDENT_AMBULATORY_CARE_PROVIDER_SITE_OTHER): Payer: Self-pay | Admitting: Nurse Practitioner

## 2023-07-31 DIAGNOSIS — N186 End stage renal disease: Secondary | ICD-10-CM | POA: Diagnosis not present

## 2023-07-31 DIAGNOSIS — Z992 Dependence on renal dialysis: Secondary | ICD-10-CM | POA: Diagnosis not present

## 2023-08-02 DIAGNOSIS — Z992 Dependence on renal dialysis: Secondary | ICD-10-CM | POA: Diagnosis not present

## 2023-08-02 DIAGNOSIS — C61 Malignant neoplasm of prostate: Secondary | ICD-10-CM | POA: Diagnosis not present

## 2023-08-02 DIAGNOSIS — N186 End stage renal disease: Secondary | ICD-10-CM | POA: Diagnosis not present

## 2023-08-05 DIAGNOSIS — Z992 Dependence on renal dialysis: Secondary | ICD-10-CM | POA: Diagnosis not present

## 2023-08-05 DIAGNOSIS — N186 End stage renal disease: Secondary | ICD-10-CM | POA: Diagnosis not present

## 2023-08-06 DIAGNOSIS — N186 End stage renal disease: Secondary | ICD-10-CM | POA: Diagnosis not present

## 2023-08-07 DIAGNOSIS — N186 End stage renal disease: Secondary | ICD-10-CM | POA: Diagnosis not present

## 2023-08-07 DIAGNOSIS — Z992 Dependence on renal dialysis: Secondary | ICD-10-CM | POA: Diagnosis not present

## 2023-08-10 DIAGNOSIS — N186 End stage renal disease: Secondary | ICD-10-CM | POA: Diagnosis not present

## 2023-08-10 DIAGNOSIS — Z992 Dependence on renal dialysis: Secondary | ICD-10-CM | POA: Diagnosis not present

## 2023-08-12 DIAGNOSIS — Z992 Dependence on renal dialysis: Secondary | ICD-10-CM | POA: Diagnosis not present

## 2023-08-12 DIAGNOSIS — N186 End stage renal disease: Secondary | ICD-10-CM | POA: Diagnosis not present

## 2023-08-14 DIAGNOSIS — Z992 Dependence on renal dialysis: Secondary | ICD-10-CM | POA: Diagnosis not present

## 2023-08-14 DIAGNOSIS — N186 End stage renal disease: Secondary | ICD-10-CM | POA: Diagnosis not present

## 2023-08-17 DIAGNOSIS — N186 End stage renal disease: Secondary | ICD-10-CM | POA: Diagnosis not present

## 2023-08-17 DIAGNOSIS — Z992 Dependence on renal dialysis: Secondary | ICD-10-CM | POA: Diagnosis not present

## 2023-08-19 DIAGNOSIS — Z992 Dependence on renal dialysis: Secondary | ICD-10-CM | POA: Diagnosis not present

## 2023-08-19 DIAGNOSIS — N186 End stage renal disease: Secondary | ICD-10-CM | POA: Diagnosis not present

## 2023-08-21 DIAGNOSIS — N186 End stage renal disease: Secondary | ICD-10-CM | POA: Diagnosis not present

## 2023-08-21 DIAGNOSIS — Z992 Dependence on renal dialysis: Secondary | ICD-10-CM | POA: Diagnosis not present

## 2023-08-22 DIAGNOSIS — N186 End stage renal disease: Secondary | ICD-10-CM | POA: Diagnosis not present

## 2023-08-22 DIAGNOSIS — Z992 Dependence on renal dialysis: Secondary | ICD-10-CM | POA: Diagnosis not present

## 2023-08-24 DIAGNOSIS — N186 End stage renal disease: Secondary | ICD-10-CM | POA: Diagnosis not present

## 2023-08-24 DIAGNOSIS — Z992 Dependence on renal dialysis: Secondary | ICD-10-CM | POA: Diagnosis not present

## 2023-08-26 DIAGNOSIS — Z885 Allergy status to narcotic agent status: Secondary | ICD-10-CM | POA: Diagnosis not present

## 2023-08-26 DIAGNOSIS — N186 End stage renal disease: Secondary | ICD-10-CM | POA: Diagnosis not present

## 2023-08-26 DIAGNOSIS — E1122 Type 2 diabetes mellitus with diabetic chronic kidney disease: Secondary | ICD-10-CM | POA: Diagnosis not present

## 2023-08-26 DIAGNOSIS — Z538 Procedure and treatment not carried out for other reasons: Secondary | ICD-10-CM | POA: Diagnosis not present

## 2023-08-26 DIAGNOSIS — I120 Hypertensive chronic kidney disease with stage 5 chronic kidney disease or end stage renal disease: Secondary | ICD-10-CM | POA: Diagnosis not present

## 2023-08-27 DIAGNOSIS — N186 End stage renal disease: Secondary | ICD-10-CM | POA: Diagnosis not present

## 2023-08-27 DIAGNOSIS — Z992 Dependence on renal dialysis: Secondary | ICD-10-CM | POA: Diagnosis not present

## 2023-08-28 DIAGNOSIS — Z992 Dependence on renal dialysis: Secondary | ICD-10-CM | POA: Diagnosis not present

## 2023-08-28 DIAGNOSIS — N186 End stage renal disease: Secondary | ICD-10-CM | POA: Diagnosis not present

## 2023-08-31 DIAGNOSIS — N186 End stage renal disease: Secondary | ICD-10-CM | POA: Diagnosis not present

## 2023-08-31 DIAGNOSIS — Z992 Dependence on renal dialysis: Secondary | ICD-10-CM | POA: Diagnosis not present

## 2023-09-02 DIAGNOSIS — N186 End stage renal disease: Secondary | ICD-10-CM | POA: Diagnosis not present

## 2023-09-02 DIAGNOSIS — Z992 Dependence on renal dialysis: Secondary | ICD-10-CM | POA: Diagnosis not present

## 2023-09-04 DIAGNOSIS — Z992 Dependence on renal dialysis: Secondary | ICD-10-CM | POA: Diagnosis not present

## 2023-09-04 DIAGNOSIS — N186 End stage renal disease: Secondary | ICD-10-CM | POA: Diagnosis not present

## 2023-09-07 DIAGNOSIS — N186 End stage renal disease: Secondary | ICD-10-CM | POA: Diagnosis not present

## 2023-09-07 DIAGNOSIS — Z992 Dependence on renal dialysis: Secondary | ICD-10-CM | POA: Diagnosis not present

## 2023-09-09 DIAGNOSIS — Z992 Dependence on renal dialysis: Secondary | ICD-10-CM | POA: Diagnosis not present

## 2023-09-09 DIAGNOSIS — N186 End stage renal disease: Secondary | ICD-10-CM | POA: Diagnosis not present

## 2023-09-10 DIAGNOSIS — Z992 Dependence on renal dialysis: Secondary | ICD-10-CM | POA: Diagnosis not present

## 2023-09-10 DIAGNOSIS — N186 End stage renal disease: Secondary | ICD-10-CM | POA: Diagnosis not present

## 2023-09-11 DIAGNOSIS — Z992 Dependence on renal dialysis: Secondary | ICD-10-CM | POA: Diagnosis not present

## 2023-09-11 DIAGNOSIS — N186 End stage renal disease: Secondary | ICD-10-CM | POA: Diagnosis not present

## 2023-09-14 DIAGNOSIS — N186 End stage renal disease: Secondary | ICD-10-CM | POA: Diagnosis not present

## 2023-09-14 DIAGNOSIS — Z992 Dependence on renal dialysis: Secondary | ICD-10-CM | POA: Diagnosis not present

## 2023-09-16 DIAGNOSIS — Z992 Dependence on renal dialysis: Secondary | ICD-10-CM | POA: Diagnosis not present

## 2023-09-16 DIAGNOSIS — N186 End stage renal disease: Secondary | ICD-10-CM | POA: Diagnosis not present

## 2023-09-17 DIAGNOSIS — E1122 Type 2 diabetes mellitus with diabetic chronic kidney disease: Secondary | ICD-10-CM | POA: Diagnosis not present

## 2023-09-17 DIAGNOSIS — T8242XA Displacement of vascular dialysis catheter, initial encounter: Secondary | ICD-10-CM | POA: Diagnosis not present

## 2023-09-17 DIAGNOSIS — N186 End stage renal disease: Secondary | ICD-10-CM | POA: Diagnosis not present

## 2023-09-17 DIAGNOSIS — E785 Hyperlipidemia, unspecified: Secondary | ICD-10-CM | POA: Diagnosis not present

## 2023-09-17 DIAGNOSIS — Z4901 Encounter for fitting and adjustment of extracorporeal dialysis catheter: Secondary | ICD-10-CM | POA: Diagnosis not present

## 2023-09-17 DIAGNOSIS — M1030 Gout due to renal impairment, unspecified site: Secondary | ICD-10-CM | POA: Diagnosis not present

## 2023-09-17 DIAGNOSIS — I120 Hypertensive chronic kidney disease with stage 5 chronic kidney disease or end stage renal disease: Secondary | ICD-10-CM | POA: Diagnosis not present

## 2023-09-17 DIAGNOSIS — D631 Anemia in chronic kidney disease: Secondary | ICD-10-CM | POA: Diagnosis not present

## 2023-09-18 DIAGNOSIS — Z992 Dependence on renal dialysis: Secondary | ICD-10-CM | POA: Diagnosis not present

## 2023-09-18 DIAGNOSIS — N186 End stage renal disease: Secondary | ICD-10-CM | POA: Diagnosis not present

## 2023-09-21 DIAGNOSIS — N186 End stage renal disease: Secondary | ICD-10-CM | POA: Diagnosis not present

## 2023-09-21 DIAGNOSIS — Z992 Dependence on renal dialysis: Secondary | ICD-10-CM | POA: Diagnosis not present

## 2023-09-23 DIAGNOSIS — N186 End stage renal disease: Secondary | ICD-10-CM | POA: Diagnosis not present

## 2023-09-23 DIAGNOSIS — Z992 Dependence on renal dialysis: Secondary | ICD-10-CM | POA: Diagnosis not present

## 2023-09-25 DIAGNOSIS — N186 End stage renal disease: Secondary | ICD-10-CM | POA: Diagnosis not present

## 2023-09-25 DIAGNOSIS — Z992 Dependence on renal dialysis: Secondary | ICD-10-CM | POA: Diagnosis not present

## 2023-09-28 DIAGNOSIS — Z992 Dependence on renal dialysis: Secondary | ICD-10-CM | POA: Diagnosis not present

## 2023-09-28 DIAGNOSIS — N186 End stage renal disease: Secondary | ICD-10-CM | POA: Diagnosis not present

## 2023-10-01 DIAGNOSIS — N186 End stage renal disease: Secondary | ICD-10-CM | POA: Diagnosis not present

## 2023-10-01 DIAGNOSIS — Z992 Dependence on renal dialysis: Secondary | ICD-10-CM | POA: Diagnosis not present

## 2023-10-02 DIAGNOSIS — Z992 Dependence on renal dialysis: Secondary | ICD-10-CM | POA: Diagnosis not present

## 2023-10-02 DIAGNOSIS — N186 End stage renal disease: Secondary | ICD-10-CM | POA: Diagnosis not present

## 2023-10-05 ENCOUNTER — Other Ambulatory Visit (INDEPENDENT_AMBULATORY_CARE_PROVIDER_SITE_OTHER): Payer: Self-pay | Admitting: Nurse Practitioner

## 2023-10-05 DIAGNOSIS — N186 End stage renal disease: Secondary | ICD-10-CM

## 2023-10-05 DIAGNOSIS — Z992 Dependence on renal dialysis: Secondary | ICD-10-CM | POA: Diagnosis not present

## 2023-10-06 ENCOUNTER — Ambulatory Visit (INDEPENDENT_AMBULATORY_CARE_PROVIDER_SITE_OTHER): Payer: Medicare HMO | Admitting: Nurse Practitioner

## 2023-10-06 ENCOUNTER — Encounter (INDEPENDENT_AMBULATORY_CARE_PROVIDER_SITE_OTHER): Payer: Medicare HMO

## 2023-10-07 DIAGNOSIS — Z992 Dependence on renal dialysis: Secondary | ICD-10-CM | POA: Diagnosis not present

## 2023-10-07 DIAGNOSIS — N186 End stage renal disease: Secondary | ICD-10-CM | POA: Diagnosis not present

## 2023-10-08 DIAGNOSIS — N186 End stage renal disease: Secondary | ICD-10-CM | POA: Diagnosis not present

## 2023-10-08 DIAGNOSIS — Z992 Dependence on renal dialysis: Secondary | ICD-10-CM | POA: Diagnosis not present

## 2023-12-01 ENCOUNTER — Encounter: Payer: Self-pay | Admitting: Internal Medicine

## 2023-12-01 ENCOUNTER — Ambulatory Visit (INDEPENDENT_AMBULATORY_CARE_PROVIDER_SITE_OTHER): Payer: Self-pay | Admitting: Internal Medicine

## 2023-12-01 VITALS — BP 114/72 | HR 92 | Ht 71.0 in | Wt 298.2 lb

## 2023-12-01 DIAGNOSIS — Z131 Encounter for screening for diabetes mellitus: Secondary | ICD-10-CM

## 2023-12-01 DIAGNOSIS — I10 Essential (primary) hypertension: Secondary | ICD-10-CM | POA: Diagnosis not present

## 2023-12-01 DIAGNOSIS — C61 Malignant neoplasm of prostate: Secondary | ICD-10-CM | POA: Diagnosis not present

## 2023-12-01 DIAGNOSIS — Z0000 Encounter for general adult medical examination without abnormal findings: Secondary | ICD-10-CM

## 2023-12-01 DIAGNOSIS — E782 Mixed hyperlipidemia: Secondary | ICD-10-CM | POA: Diagnosis not present

## 2023-12-01 DIAGNOSIS — D6869 Other thrombophilia: Secondary | ICD-10-CM | POA: Insufficient documentation

## 2023-12-01 DIAGNOSIS — N186 End stage renal disease: Secondary | ICD-10-CM

## 2023-12-01 DIAGNOSIS — N2581 Secondary hyperparathyroidism of renal origin: Secondary | ICD-10-CM

## 2023-12-01 MED ORDER — ATORVASTATIN CALCIUM 10 MG PO TABS: 10.0000 mg | ORAL_TABLET | Freq: Every day | ORAL | 3 refills | Status: AC

## 2023-12-01 NOTE — Assessment & Plan Note (Signed)
 LDL is  Lab Results  Component Value Date   LDLCALC 65 11/30/2022   Current regimen is atorvastatin .  No medication side effects noted. Goal LDL is <70.

## 2023-12-01 NOTE — Progress Notes (Signed)
 Date:  12/01/2023   Name:  Benjamin Stewart   DOB:  1975/12/11   MRN:  [id number]   Chief Complaint: Annual Exam Benjamin Stewart is a 48 y.o. male who presents today for his Complete Annual Exam. He feels well. He reports exercising none. He reports he is sleeping fairly well.   Health Maintenance  Topic Date Due   Zoster (Shingles) Vaccine (1 of 2) Never done   Medicare Annual Wellness Visit  11/30/2023   COVID-19 Vaccine (5 - 2024-25 season) 12/17/2023*   Flu Shot  03/10/2024   DTaP/Tdap/Td vaccine (2 - Td or Tdap) 06/13/2025   Colon Cancer Screening  10/04/2027   Pneumococcal Vaccination  Completed   Hepatitis C Screening  Completed   HIV Screening  Completed   HPV Vaccine  Aged Out   Meningitis B Vaccine  Aged Out  *Topic was postponed. The date shown is not the original due date.    Lab Results  Component Value Date   PSA1 5.7 (H) 09/03/2020   PSA1 3.8 05/07/2020   PSA1 2.3 04/27/2019   PSA 12.62 07/19/2023   PSA 11 06/15/2022     Hypertension This is a chronic problem. The problem is controlled. Pertinent negatives include no chest pain, headaches, palpitations or shortness of breath. Hypertensive end-organ damage includes kidney disease. There is no history of CAD/MI or CVA.  Hyperlipidemia Pertinent negatives include no chest pain, myalgias or shortness of breath.  Diabetes He presents for his follow-up diabetic visit. Diabetes type: prediabetes. His disease course has been stable. Pertinent negatives for hypoglycemia include no dizziness, headaches or nervousness/anxiousness. Pertinent negatives for diabetes include no chest pain and no fatigue. Pertinent negatives for diabetic complications include no CVA. Current diabetic treatment includes diet.  ESRD - doing well in HD;   he recently had his left AVF redone and is currently using at [facility].  Prostate cancer - last PSA done 07/2023 12.62.  He is s/p seed implants and now just under monitoring.  Review of  Systems  Constitutional:  Negative for appetite change, chills, diaphoresis, fatigue and unexpected weight change.  HENT:  Negative for hearing loss, tinnitus, trouble swallowing and voice change.   Eyes:  Negative for visual disturbance.  Respiratory:  Negative for choking, shortness of breath and wheezing.   Cardiovascular:  Negative for chest pain, palpitations and leg swelling.  Gastrointestinal:  Negative for abdominal pain, blood in stool, constipation and diarrhea.  Musculoskeletal:  Negative for arthralgias, back pain and myalgias.  Skin:  Negative for color change and rash.  Neurological:  Negative for dizziness, syncope and headaches.  Hematological:  Negative for adenopathy.  Psychiatric/Behavioral:  Negative for dysphoric mood and sleep disturbance. The patient is not nervous/anxious.      Lab Results  Component Value Date   NA 138 11/30/2022   K 5.7 (H) 11/30/2022   CO2 18 (L) 11/30/2022   GLUCOSE 98 11/30/2022   BUN 66 (H) 11/30/2022   CREATININE 12.17 (H) 11/30/2022   CALCIUM  9.1 11/30/2022   EGFR 5 (L) 11/30/2022   GFRNONAA 5 (L) 05/07/2021   Lab Results  Component Value Date   CHOL 118 11/30/2022   HDL 35 (L) 11/30/2022   LDLCALC 65 11/30/2022   TRIG 92 11/30/2022   CHOLHDL 3.4 11/30/2022   Lab Results  Component Value Date   TSH 1.173 11/09/2019   Lab Results  Component Value Date   HGBA1C 5.9 (H) 11/30/2022   Lab Results  Component Value  Date   WBC 9.4 07/19/2023   HGB 12.9 (A) 07/19/2023   HCT 42 07/19/2023   MCV 91 11/30/2022   PLT 345 07/19/2023   Lab Results  Component Value Date   ALT 14 07/19/2023   AST 14 07/19/2023   ALKPHOS 62 07/19/2023   BILITOT 0.6 11/30/2022   No results found for: "25OHVITD2", "25OHVITD3", "VD25OH"   Patient Active Problem List   Diagnosis Date Noted   Acquired thrombophilia (HCC) 12/01/2023   Arteriovenous fistula, acquired (HCC) 02/03/2023   Calculus of gallbladder and bile duct without cholecystitis or  obstruction 02/03/2023   Obesity, morbid (HCC) 11/20/2020   End stage renal disease (HCC) 04/11/2020   Secondary hyperparathyroidism of renal origin (HCC) 04/24/2019   Localized edema 01/19/2019   Chronic gout of multiple sites 08/16/2018   Prostate cancer (HCC) 08/23/2015   Hyperlipidemia, mixed 07/16/2015   Essential (primary) hypertension 12/04/2014    Allergies  Allergen Reactions   Glimepiride  Other (See Comments)    Severe hypoglycemia   Other    Oxycodone Hcl Nausea And Vomiting    Past Surgical History:  Procedure Laterality Date   A/V FISTULAGRAM Left 05/30/2020   Procedure: A/V FISTULAGRAM;  Surgeon: Celso College, MD;  Location: [facility];  Service: Cardiovascular;  Laterality: Left;   AV FISTULA PLACEMENT Left 03/07/2020   Procedure: ARTERIOVENOUS (AV) FISTULA CREATION BRACHIAL CEPHALIC;  Surgeon: Celso College, MD;  Location: [facility];  Service: Vascular;  Laterality: Left;   DIALYSIS/PERMA CATHETER INSERTION N/A 01/17/2020   Procedure: DIALYSIS/PERMA CATHETER INSERTION;  Surgeon: Celso College, MD;  Location: [facility];  Service: Cardiovascular;  Laterality: N/A;   DIALYSIS/PERMA CATHETER REMOVAL N/A 10/07/2020   Procedure: DIALYSIS/PERMA CATHETER REMOVAL;  Surgeon: Celso College, MD;  Location: [facility];  Service: Cardiovascular;  Laterality: N/A;   PARTIAL COLECTOMY  2005   RADIOACTIVE SEED IMPLANT N/A 12/06/2017   Procedure: RADIOACTIVE SEED IMPLANT/BRACHYTHERAPY IMPLANT;  Surgeon: Dustin Gimenez, MD;  Location: [facility];  Service: Urology;  Laterality: N/A;   UPPER EXTREMITY ANGIOGRAPHY Left 04/18/2020   Procedure: UPPER EXTREMITY ANGIOGRAPHY;  Surgeon: Celso College, MD;  Location: [facility];  Service: Cardiovascular;  Laterality: Left;    Social History   Tobacco Use   Smoking status: Never   Smokeless tobacco: Never  Vaping Use   Vaping status: Never Used  Substance Use Topics   Alcohol use: No    Alcohol/week: 0.0  standard drinks of alcohol   Drug use: No     Medication list has been reviewed and updated.  Current Meds  Medication Sig   amLODipine  (NORVASC ) 10 MG tablet 10 mg daily.   ASPIRIN  LOW DOSE 81 MG tablet TAKE 1 TABLET BY MOUTH DAILY   calcitRIOL  (ROCALTROL ) 0.25 MCG capsule Take 0.25 mcg by mouth 3 (three) times a week.   calcium  acetate (PHOSLO) 667 MG capsule Take 2,001 mg by mouth 3 (three) times daily.   carvedilol  (COREG ) 6.25 MG tablet Take 6.25 mg by mouth 2 (two) times daily with a meal.    cinacalcet  (SENSIPAR ) 30 MG tablet Take 30 mg by mouth 3 (three) times a week.   furosemide (LASIX) 80 MG tablet Take 80 mg by mouth daily.   midodrine (PROAMATINE) 5 MG tablet Take 5 mg by mouth as needed.   XPHOZAH 30 MG TABS Take by mouth.   [DISCONTINUED] atorvastatin  (LIPITOR) 10 MG tablet Take 1 tablet (10 mg total) by mouth daily.   [  DISCONTINUED] quinapril (ACCUPRIL) 40 MG tablet Take 40 mg by mouth.       12/01/2023    8:36 AM 02/03/2023    1:41 PM 11/30/2022    9:12 AM 11/26/2021    8:46 AM  GAD 7 : Generalized Anxiety Score  Nervous, Anxious, on Edge 0 0 0 0  Control/stop worrying 0 0 0 0  Worry too much - different things 0 0 0 0  Trouble relaxing 0 0 0 0  Restless 0 0 0 0  Easily annoyed or irritable 0 0 0 0  Afraid - awful might happen 0 0 0 0  Total GAD 7 Score 0 0 0 0  Anxiety Difficulty Not difficult at all Not difficult at all Not difficult at all Not difficult at all       12/01/2023    8:36 AM 02/03/2023    1:41 PM 11/30/2022    9:12 AM  Depression screen PHQ 2/9  Decreased Interest 0 0 0  Down, Depressed, Hopeless 0 0 0  PHQ - 2 Score 0 0 0  Altered sleeping 0 0 0  Tired, decreased energy 0 0 0  Change in appetite 0 0 0  Feeling bad or failure about yourself  0 0 0  Trouble concentrating 0 0 0  Moving slowly or fidgety/restless 0 0 0  Suicidal thoughts 0 0 0  PHQ-9 Score 0 0 0  Difficult doing work/chores Not difficult at all Not difficult at all Not  difficult at all    BP Readings from Last 3 Encounters:  12/01/23 114/72  06/02/23 118/70  03/26/23 (!) 128/93    Physical Exam Vitals and nursing note reviewed.  Constitutional:      Appearance: Normal appearance. He is well-developed.  HENT:     Head: Normocephalic.     Right Ear: Tympanic membrane, ear canal and external ear normal.     Left Ear: Tympanic membrane, ear canal and external ear normal.     Nose: Nose normal.  Eyes:     Conjunctiva/sclera: Conjunctivae normal.     Pupils: Pupils are equal, round, and reactive to light.  Neck:     Thyroid: No thyromegaly.     Vascular: No carotid bruit.  Cardiovascular:     Rate and Rhythm: Normal rate and regular rhythm.     Pulses: Normal pulses.     Heart sounds: Normal heart sounds.     Arteriovenous access: Left arteriovenous access is present.    Comments: Right [facility] cath Pulmonary:     Effort: Pulmonary effort is normal.     Breath sounds: Normal breath sounds. No wheezing.  Chest:  Breasts:    Right: No mass.     Left: No mass.  Abdominal:     General: Abdomen is protuberant. Bowel sounds are normal.     Palpations: Abdomen is soft.     Tenderness: There is no abdominal tenderness. There is no guarding or rebound.  Musculoskeletal:        General: Normal range of motion.     Cervical back: Normal range of motion and neck supple.     Right lower leg: No edema.     Left lower leg: No edema.  Lymphadenopathy:     Cervical: No cervical adenopathy.  Skin:    General: Skin is warm and dry.  Neurological:     Mental Status: He is alert and oriented to person, place, and time.     Deep Tendon Reflexes: Reflexes are  normal and symmetric.  Psychiatric:        Attention and Perception: Attention normal.        Mood and Affect: Mood normal.        Thought Content: Thought content normal.     Wt Readings from Last 3 Encounters:  12/01/23 298 lb 4 oz (135.3 kg)  06/02/23 293 lb (132.9 kg)  03/26/23 291 lb  12.8 oz (132.4 kg)    BP 114/72   Pulse 92   Ht 5\' 11"  (1.803 m)   Wt 298 lb 4 oz (135.3 kg)   SpO2 97%   BMI 41.60 kg/m   Assessment and Plan:  Problem List Items Addressed This Visit       Unprioritized   Essential (primary) hypertension (Chronic)   Blood pressure is well controlled.  Current medications are Coreg  and amlodipine . Will continue same regimen along with efforts to limit dietary sodium.       Relevant Medications   atorvastatin  (LIPITOR) 10 MG tablet   Other Relevant Orders   TSH   Hyperlipidemia, mixed (Chronic)   LDL is  Lab Results  Component Value Date   LDLCALC 65 11/30/2022   Current regimen is atorvastatin .  No medication side effects noted. Goal LDL is <70.       Relevant Medications   atorvastatin  (LIPITOR) 10 MG tablet   Other Relevant Orders   Lipid panel   Prostate cancer (HCC) (Chronic)   Currently under active surveillance S/p Radioactive seed implants PSA is slowly decreasing - last 12      Secondary hyperparathyroidism of renal origin (HCC) (Chronic)   On medications from Nephrology to control symptoms      End stage renal disease (HCC) (Chronic)   Doing well in HD - via [facility] generally but recent revision has him using a [facility].      Obesity, morbid (HCC)   He has gained a bit of weight recently. Discussed limiting carbohydrates.      Acquired thrombophilia (HCC)   Due to Plavix  therapy. No bleeding issues nted.      Other Visit Diagnoses       Annual physical exam    -  Primary   up to date on screening and immunizations Rec Shingrix vaccine at local pharmacy     Screening for diabetes mellitus       Relevant Orders   Hemoglobin A1c       Return in about 6 months (around 06/01/2024) for HTN, DM.    Sheron Dixons, MD [facility] [place]

## 2023-12-01 NOTE — Assessment & Plan Note (Signed)
 Currently under active surveillance S/p Radioactive seed implants PSA is slowly decreasing - last 12

## 2023-12-01 NOTE — Assessment & Plan Note (Signed)
 Doing well in HD - via [facility] generally but recent revision has him using a [facility].

## 2023-12-01 NOTE — Assessment & Plan Note (Signed)
 On medications from Nephrology to control symptoms

## 2023-12-01 NOTE — Assessment & Plan Note (Signed)
 He has gained a bit of weight recently. Discussed limiting carbohydrates.

## 2023-12-01 NOTE — Assessment & Plan Note (Signed)
 Blood pressure is well controlled.  Current medications are Coreg  and amlodipine . Will continue same regimen along with efforts to limit dietary sodium.

## 2023-12-01 NOTE — Assessment & Plan Note (Signed)
 Due to Plavix  therapy. No bleeding issues nted.

## 2023-12-02 ENCOUNTER — Encounter: Payer: Self-pay | Admitting: Internal Medicine

## 2023-12-02 LAB — LIPID PANEL
Chol/HDL Ratio: 3.7 ratio (ref 0.0–5.0)
Cholesterol, Total: 131 mg/dL (ref 100–199)
HDL: 35 mg/dL — ABNORMAL LOW (ref >39)
LDL Chol Calc (NIH): 71 mg/dL (ref 0–99)
Triglycerides: 142 mg/dL (ref 0–149)
VLDL Cholesterol Cal: 25 mg/dL (ref 5–40)

## 2023-12-02 LAB — HEMOGLOBIN A1C
Est. average glucose Bld gHb Est-mCnc: 146 mg/dL
Hgb A1c MFr Bld: 6.7 % — ABNORMAL HIGH (ref 4.8–5.6)

## 2023-12-02 LAB — TSH: TSH: 1.59 u[IU]/mL (ref 0.450–4.500)

## 2023-12-28 ENCOUNTER — Encounter (INDEPENDENT_AMBULATORY_CARE_PROVIDER_SITE_OTHER): Payer: Self-pay

## 2024-03-02 ENCOUNTER — Ambulatory Visit: Admitting: Emergency Medicine

## 2024-03-02 VITALS — Ht 70.5 in | Wt 302.0 lb

## 2024-03-02 DIAGNOSIS — Z0000 Encounter for general adult medical examination without abnormal findings: Secondary | ICD-10-CM | POA: Diagnosis not present

## 2024-03-02 NOTE — Patient Instructions (Signed)
 Benjamin Stewart , Thank you for taking time out of your busy schedule to complete your Annual Wellness Visit with me. I enjoyed our conversation and look forward to speaking with you again next year. I, as well as your care team,  appreciate your ongoing commitment to your health goals. Please review the following plan we discussed and let me know if I can assist you in the future. Your Game plan/ To Do List    Referrals: None   Follow up Visits: Next Medicare AWV with our clinical staff: 03/15/25 @ 1:50pm (VIDEO VISIT)   Have you seen your provider in the last 6 months (3 months if uncontrolled diabetes)? Yes Next Office Visit with your provider: 05/17/24 @ 8:20am with Dr. Justus  Clinician Recommendations:  Aim for 30 minutes of exercise or brisk walking, 6-8 glasses of water, and 5 servings of fruits and vegetables each day.       This is a list of the screening recommended for you and due dates:  Health Maintenance  Topic Date Due   Zoster (Shingles) Vaccine (1 of 2) Never done   COVID-19 Vaccine (5 - 2024-25 season) 04/11/2023   Flu Shot  03/10/2024   Medicare Annual Wellness Visit  03/02/2025   DTaP/Tdap/Td vaccine (2 - Td or Tdap) 06/13/2025   Colon Cancer Screening  10/04/2027   Pneumococcal Vaccination  Completed   Hepatitis B Vaccine  Completed   Hepatitis C Screening  Completed   HIV Screening  Completed   HPV Vaccine  Aged Out   Meningitis B Vaccine  Aged Out    Advanced directives: (Declined) Advance directive discussed with you today. Even though you declined this today, please call our office should you change your mind, and we can give you the proper paperwork for you to fill out. Advance Care Planning is important because it:  [x]  Makes sure you receive the medical care that is consistent with your values, goals, and preferences  [x]  It provides guidance to your family and loved ones and reduces their decisional burden about whether or not they are making the right  decisions based on your wishes.  Follow the link provided in your after visit summary or read over the paperwork we have mailed to you to help you started getting your Advance Directives in place. If you need assistance in completing these, please reach out to us  so that we can help you!  See attachments for Preventive Care and Fall Prevention Tips.   Fall Prevention in the Home, Adult Falls can cause injuries and affect people of all ages. There are many simple things that you can do to make your home safe and to help prevent falls. If you need it, ask for help making these changes. What actions can I take to prevent falls? General information Use good lighting in all rooms. Make sure to: Replace any light bulbs that burn out. Turn on lights if it is dark and use night-lights. Keep items that you use often in easy-to-reach places. Lower the shelves around your home if needed. Move furniture so that there are clear paths around it. Do not keep throw rugs or other things on the floor that can make you trip. If any of your floors are uneven, fix them. Add color or contrast paint or tape to clearly mark and help you see: Grab bars or handrails. First and last steps of staircases. Where the edge of each step is. If you use a ladder or stepladder: Make sure  that it is fully opened. Do not climb a closed ladder. Make sure the sides of the ladder are locked in place. Have someone hold the ladder while you use it. Know where your pets are as you move through your home. What can I do in the bathroom?     Keep the floor dry. Clean up any water that is on the floor right away. Remove soap buildup in the bathtub or shower. Buildup makes bathtubs and showers slippery. Use non-skid mats or decals on the floor of the bathtub or shower. Attach bath mats securely with double-sided, non-slip rug tape. If you need to sit down while you are in the shower, use a non-slip stool. Install grab bars by  the toilet and in the bathtub and shower. Do not use towel bars as grab bars. What can I do in the bedroom? Make sure that you have a light by your bed that is easy to reach. Do not use any sheets or blankets on your bed that hang to the floor. Have a firm bench or chair with side arms that you can use for support when you get dressed. What can I do in the kitchen? Clean up any spills right away. If you need to reach something above you, use a sturdy step stool that has a grab bar. Keep electrical cables out of the way. Do not use floor polish or wax that makes floors slippery. What can I do with my stairs? Do not leave anything on the stairs. Make sure that you have a light switch at the top and the bottom of the stairs. Have them installed if you do not have them. Make sure that there are handrails on both sides of the stairs. Fix handrails that are broken or loose. Make sure that handrails are as long as the staircases. Install non-slip stair treads on all stairs in your home if they do not have carpet. Avoid having throw rugs at the top or bottom of stairs, or secure the rugs with carpet tape to prevent them from moving. Choose a carpet design that does not hide the edge of steps on the stairs. Make sure that carpet is firmly attached to the stairs. Fix any carpet that is loose or worn. What can I do on the outside of my home? Use bright outdoor lighting. Repair the edges of walkways and driveways and fix any cracks. Clear paths of anything that can make you trip, such as tools or rocks. Add color or contrast paint or tape to clearly mark and help you see high doorway thresholds. Trim any bushes or trees on the main path into your home. Check that handrails are securely fastened and in good repair. Both sides of all steps should have handrails. Install guardrails along the edges of any raised decks or porches. Have leaves, snow, and ice cleared regularly. Use sand, salt, or ice melt on  walkways during winter months if you live where there is ice and snow. In the garage, clean up any spills right away, including grease or oil spills. What other actions can I take? Review your medicines with your health care provider. Some medicines can make you confused or feel dizzy. This can increase your chance of falling. Wear closed-toe shoes that fit well and support your feet. Wear shoes that have rubber soles and low heels. Use a cane, walker, scooter, or crutches that help you move around if needed. Talk with your provider about other ways that you can decrease  your risk of falls. This may include seeing a physical therapist to learn to do exercises to improve movement and strength. Where to find more information Centers for Disease Control and Prevention, STEADI: TonerPromos.no [employer] on Aging: BaseRingTones.pl National Institute on Aging: BaseRingTones.pl Contact a health care provider if: You are afraid of falling at home. You feel weak, drowsy, or dizzy at home. You fall at home. Get help right away if you: Lose consciousness or have trouble moving after a fall. Have a fall that causes a head injury. These symptoms may be an emergency. Get help right away. Call 911. Do not wait to see if the symptoms will go away. Do not drive yourself to the hospital. This information is not intended to replace advice given to you by your health care provider. Make sure you discuss any questions you have with your health care provider. Document Revised: 03/30/2022 Document Reviewed: 03/30/2022 Elsevier Patient Education  2024 [employer].

## 2024-03-02 NOTE — Progress Notes (Signed)
 Subjective:   Benjamin Stewart is a 48 y.o. who presents for a Medicare Wellness preventive visit.  As a reminder, Annual Wellness Visits don't include a physical exam, and some assessments may be limited, especially if this visit is performed virtually. We may recommend an in-person follow-up visit with your provider if needed.  Visit Complete: Virtual I connected with  [name] on 03/02/24 by a audio enabled telemedicine application and verified that I am speaking with the correct person using two identifiers.  Patient Location: Home  Provider Location: Home Office  I discussed the limitations of evaluation and management by telemedicine. The patient expressed understanding and agreed to proceed.  Vital Signs: Because this visit was a virtual/telehealth visit, some criteria may be missing or patient reported. Any vitals not documented were not able to be obtained and vitals that have been documented are patient reported.  VideoDeclined- This patient declined Librarian, academic. Therefore the visit was completed with audio only.  Persons Participating in Visit: Patient.  AWV Questionnaire: Yes: Patient Medicare AWV questionnaire was completed by the patient on 02/24/24; I have confirmed that all information answered by patient is correct and no changes since this date.  Cardiac Risk Factors include: male gender;dyslipidemia;hypertension;obesity (BMI >30kg/m2)     Objective:    Today's Vitals   03/02/24 1550  Weight: (!) 302 lb (137 kg)  Height: 5' 10.5 (1.791 m)   Body mass index is 42.72 kg/m.     03/02/2024    4:02 PM 11/30/2022    9:20 AM 10/31/2020    9:30 AM 05/30/2020    8:14 AM 04/26/2020    9:37 AM 04/18/2020   11:44 AM 03/06/2020    2:04 PM  Advanced Directives  Does Patient Have a Medical Advance Directive? No No No No No No No  Would patient like information on creating a medical advance directive? No - Patient declined No -  Patient declined No - Patient declined No - Patient declined No - Patient declined      Current Medications (verified) Outpatient Encounter Medications as of 03/02/2024  Medication Sig   amLODipine  (NORVASC ) 10 MG tablet 10 mg daily.   ASPIRIN  LOW DOSE 81 MG tablet TAKE 1 TABLET BY MOUTH DAILY   atorvastatin  (LIPITOR) 10 MG tablet Take 1 tablet (10 mg total) by mouth daily.   calcitRIOL  (ROCALTROL ) 0.25 MCG capsule Take 0.25 mcg by mouth 3 (three) times a week.   calcium  acetate (PHOSLO) 667 MG capsule Take 2,001 mg by mouth 3 (three) times daily. (Patient taking differently: Take 2,001 mg by mouth 3 (three) times daily. Taking 3 tablet twice a day)   carvedilol  (COREG ) 6.25 MG tablet Take 6.25 mg by mouth 2 (two) times daily with a meal.    cinacalcet  (SENSIPAR ) 30 MG tablet Take 30 mg by mouth 3 (three) times a week.   clopidogrel  (PLAVIX ) 75 MG tablet Take 75 mg by mouth daily.   furosemide (LASIX) 80 MG tablet Take 80 mg by mouth daily.   midodrine (PROAMATINE) 5 MG tablet Take 5 mg by mouth as needed.   XPHOZAH 30 MG TABS Take by mouth. (Patient taking differently: Take by mouth. Takes 1 tablet twice a day with Phoslo)   No facility-administered encounter medications on file as of 03/02/2024.    Allergies (verified) Glimepiride , Other, and Oxycodone hcl   History: Past Medical History:  Diagnosis Date   Allergy    Anemia    Benign essential hematuria  12/04/2014   microscopic hematuria evaluation with CT and cysto - treated for 2 months with bactrim DS; no further f/u planned unless hematuria recurs.    Chronic kidney disease    Controlled type 2 diabetes mellitus without complication, without long-term current use of insulin  (HCC) 07/16/2015   COVID-19 virus infection 12/29/2019   Essential (primary) hypertension 12/04/2014   History of kidney stones    H/O   Malignant essential hypertension 12/04/2014   prostate cancer    PROSTATE    Prostate cancer (HCC)    Sleep apnea     DOES NOT USE CPAP   Past Surgical History:  Procedure Laterality Date   A/V FISTULAGRAM Left 05/30/2020   Procedure: A/V FISTULAGRAM;  Surgeon: Marea Selinda RAMAN, MD;  Location: [facility];  Service: Cardiovascular;  Laterality: Left;   AV FISTULA PLACEMENT Left 03/07/2020   Procedure: ARTERIOVENOUS (AV) FISTULA CREATION BRACHIAL CEPHALIC;  Surgeon: Marea Selinda RAMAN, MD;  Location: [facility];  Service: Vascular;  Laterality: Left;   DIALYSIS/PERMA CATHETER INSERTION N/A 01/17/2020   Procedure: DIALYSIS/PERMA CATHETER INSERTION;  Surgeon: Marea Selinda RAMAN, MD;  Location: [facility];  Service: Cardiovascular;  Laterality: N/A;   DIALYSIS/PERMA CATHETER REMOVAL N/A 10/07/2020   Procedure: DIALYSIS/PERMA CATHETER REMOVAL;  Surgeon: Marea Selinda RAMAN, MD;  Location: [facility];  Service: Cardiovascular;  Laterality: N/A;   PARTIAL COLECTOMY  08/11/2003   RADIOACTIVE SEED IMPLANT N/A 12/06/2017   Procedure: RADIOACTIVE SEED IMPLANT/BRACHYTHERAPY IMPLANT;  Surgeon: Penne Knee, MD;  Location: [facility];  Service: Urology;  Laterality: N/A;   SMALL INTESTINE SURGERY  05/2004   UPPER EXTREMITY ANGIOGRAPHY Left 04/18/2020   Procedure: UPPER EXTREMITY ANGIOGRAPHY;  Surgeon: Marea Selinda RAMAN, MD;  Location: [facility];  Service: Cardiovascular;  Laterality: Left;   Family History  Problem Relation Age of Onset   Diabetes Mother    Heart disease Mother    Hypertension Mother    Glaucoma Mother    Prostate cancer Father    Hypertension Father    Prostate cancer Maternal Uncle    Cancer Maternal Uncle    Cancer Maternal Uncle    Cancer Paternal Uncle    Cancer Paternal Uncle    Cancer Paternal Uncle    Diabetes Maternal Grandmother    Diabetes Maternal Grandfather    Social History   Socioeconomic History   Marital status: Divorced    Spouse name: Not on file   Number of children: 0   Years of education: Not on file   Highest education level: Associate degree:  occupational, Scientist, product/process development, or vocational program  Occupational History   Occupation: disability  Tobacco Use   Smoking status: Never   Smokeless tobacco: Never  Vaping Use   Vaping status: Never Used  Substance and Sexual Activity   Alcohol use: No   Drug use: No   Sexual activity: Not Currently    Birth control/protection: Condom  Other Topics Concern   Not on file  Social History Narrative   Not on file   Social Drivers of Health   Financial Resource Strain: Low Risk  (03/02/2024)   Overall Financial Resource Strain (CARDIA)    Difficulty of Paying Living Expenses: Not hard at all  Food Insecurity: No Food Insecurity (03/02/2024)   Hunger Vital Sign    Worried About Running Out of Food in the Last Year: Never true    Ran Out of Food in the Last Year: Never true  Transportation Needs: No  Transportation Needs (03/02/2024)   PRAPARE - Administrator, Civil Service (Medical): No    Lack of Transportation (Non-Medical): No  Physical Activity: Inactive (03/02/2024)   Exercise Vital Sign    Days of Exercise per Week: 0 days    Minutes of Exercise per Session: 0 min  Stress: No Stress Concern Present (03/02/2024)   [employer] of Occupational Health - Occupational Stress Questionnaire    Feeling of Stress: Not at all  Social Connections: Moderately Isolated (03/02/2024)   Social Connection and Isolation Panel    Frequency of Communication with Friends and Family: More than three times a week    Frequency of Social Gatherings with Friends and Family: More than three times a week    Attends Religious Services: 1 to 4 times per year    Active Member of [employer] or Organizations: No    Attends Engineer, structural: Never    Marital Status: Divorced    Tobacco Counseling Counseling given: Not Answered    Clinical Intake:  Pre-visit preparation completed: Yes  Pain : No/denies pain     BMI - recorded: 42.72 Nutritional Status: BMI > 30   Obese Nutritional Risks: None Diabetes: No  Lab Results  Component Value Date   HGBA1C 6.7 (H) 12/01/2023   HGBA1C 5.9 (H) 11/30/2022   HGBA1C 5.4 11/26/2021     How often do you need to have someone help you when you read instructions, pamphlets, or other written materials from your doctor or pharmacy?: 1 - Never  Interpreter Needed?: No  Information entered by :: Vina Ned, CMA   Activities of Daily Living     03/02/2024    3:52 PM 02/24/2024    8:10 AM  In your present state of health, do you have any difficulty performing the following activities:  Hearing? 0 0  Vision? 0 0  Difficulty concentrating or making decisions? 0 0  Walking or climbing stairs? 0 0  Dressing or bathing? 0 0  Doing errands, shopping? 0 0  Preparing Food and eating ? N N  Using the Toilet? N N  In the past six months, have you accidently leaked urine? N N  Do you have problems with loss of bowel control? N N  Managing your Medications? N N  Managing your Finances? N N  Housekeeping or managing your Housekeeping? N N    Patient Care Team: Justus Leita DEL, MD as PCP - General (Internal Medicine) Hoimes, Lonni PARAS, DO as Referring Physician (Hematology and Oncology) Lucio Franky PARAS, OD (Optometry) Lateef, Munsoor, MD (Nephrology) [facility] (Vascular Surgery) Dialysis, [facility]  I have updated your Care Teams any recent Medical Services you may have received from other providers in the past year.     Assessment:   This is a routine wellness examination for [employer].  Hearing/Vision screen Hearing Screening - Comments:: Denies hearing loss  Vision Screening - Comments:: Gets routine eye exams, Dr. Franky Lucio, [place] [place]   Goals Addressed               This Visit's Progress     Increase physical activity (pt-stated)         Depression Screen     03/02/2024    4:00 PM 12/01/2023    8:36 AM 02/03/2023    1:41 PM 11/30/2022    9:12 AM  11/26/2021    8:45 AM 05/23/2021    1:55 PM 11/20/2020    1:33 PM  PHQ 2/9 Scores  PHQ - 2 Score 0 0 0 0 0 0 0  PHQ- 9 Score 0 0 0 0 0 0 0    Fall Risk     03/02/2024    4:03 PM 02/24/2024    8:10 AM 12/01/2023    8:36 AM 02/03/2023    1:41 PM 11/30/2022    9:12 AM  Fall Risk   Falls in the past year? 0 0 0 0 0  Number falls in past yr: 0 0 0 0 0  Injury with Fall? 0 0 0 0 0  Risk for fall due to : No Fall Risks  No Fall Risks No Fall Risks No Fall Risks  Follow up Falls evaluation completed  Falls evaluation completed Falls evaluation completed Falls evaluation completed    MEDICARE RISK AT HOME:  Medicare Risk at Home Any stairs in or around the home?: Yes If so, are there any without handrails?: No Home free of loose throw rugs in walkways, pet beds, electrical cords, etc?: Yes Adequate lighting in your home to reduce risk of falls?: Yes Life alert?: No Use of a cane, walker or w/c?: No Grab bars in the bathroom?: No Shower chair or bench in shower?: No Elevated toilet seat or a handicapped toilet?: No  TIMED UP AND GO:  Was the test performed?  No  Cognitive Function: 6CIT completed        03/02/2024    4:05 PM  6CIT Screen  What Year? 0 points  What month? 0 points  What time? 0 points  Count back from 20 0 points  Months in reverse 0 points  Repeat phrase 0 points  Total Score 0 points    Immunizations Immunization History  Administered Date(s) Administered   Hepatitis B, ADULT 03/08/2020, 04/08/2020, 05/06/2020, 01/20/2021, 02/17/2021, 03/25/2021, 06/28/2021, 07/31/2021, 08/28/2021, 09/27/2021   Influenza,inj,Quad PF,6+ Mos 05/22/2014, 08/18/2016, 08/19/2017, 07/25/2018, 05/17/2019, 05/31/2020, 04/10/2021   Influenza-Unspecified 05/22/2014, 06/14/2015, 08/18/2016, 08/19/2017, 07/25/2018, 01/22/2020, 05/31/2020, 06/01/2023   Moderna SARS-COV2 Booster Vaccination 09/20/2020   PFIZER Comirnaty(Gray Top)Covid-19 Tri-Sucrose Vaccine 11/02/2019, 11/23/2019    PFIZER(Purple Top)SARS-COV-2 Vaccination 11/02/2019, 11/23/2019   PNEUMOCOCCAL CONJUGATE-20 04/15/2023   PPD Test 03/11/2020, 03/10/2021, 06/17/2021   Pneumococcal Conjugate-13 07/17/2020   Pneumococcal Polysaccharide-23 07/25/2018   Pneumococcal-Unspecified 06/14/2015, 07/17/2020   Tdap 06/14/2015    Screening Tests Health Maintenance  Topic Date Due   Zoster Vaccines- Shingrix (1 of 2) Never done   COVID-19 Vaccine (5 - 2024-25 season) 04/11/2023   INFLUENZA VACCINE  03/10/2024   Medicare Annual Wellness (AWV)  03/02/2025   DTaP/Tdap/Td (2 - Td or Tdap) 06/13/2025   Colonoscopy  10/04/2027   Pneumococcal Vaccine 8-37 Years old  Completed   Hepatitis B Vaccines  Completed   Hepatitis C Screening  Completed   HIV Screening  Completed   HPV VACCINES  Aged Out   Meningococcal B Vaccine  Aged Out    Health Maintenance  Health Maintenance Due  Topic Date Due   Zoster Vaccines- Shingrix (1 of 2) Never done   COVID-19 Vaccine (5 - 2024-25 season) 04/11/2023   Health Maintenance Items Addressed: See Nurse Notes at the end of this note  Additional Screening:  Vision Screening: Recommended annual ophthalmology exams for early detection of glaucoma and other disorders of the eye. Would you like a referral to an eye doctor? No    Dental Screening: Recommended annual dental exams for proper oral hygiene  Community Resource Referral / Chronic Care Management: CRR required this visit?  No   CCM required this visit?  No   Plan:    I have personally reviewed and noted the following in the patient's chart:   Medical and social history Use of alcohol, tobacco or illicit drugs  Current medications and supplements including opioid prescriptions. Patient is not currently taking opioid prescriptions. Functional ability and status Nutritional status Physical activity Advanced directives List of other physicians Hospitalizations, surgeries, and ER visits in previous 12  months Vitals Screenings to include cognitive, depression, and falls Referrals and appointments  In addition, I have reviewed and discussed with patient certain preventive protocols, quality metrics, and best practice recommendations. A written personalized care plan for preventive services as well as general preventive health recommendations were provided to patient.   Vina Ned, CMA   03/02/2024   After Visit Summary: (MyChart) Due to this being a telephonic visit, the after visit summary with patients personalized plan was offered to patient via MyChart   Notes:  Declined covid vaccines Declined Shingrix vaccine at this time

## 2024-03-10 DIAGNOSIS — E8770 Fluid overload, unspecified: Secondary | ICD-10-CM | POA: Diagnosis not present

## 2024-03-10 DIAGNOSIS — Z992 Dependence on renal dialysis: Secondary | ICD-10-CM | POA: Diagnosis not present

## 2024-03-10 DIAGNOSIS — N186 End stage renal disease: Secondary | ICD-10-CM | POA: Diagnosis not present

## 2024-03-13 DIAGNOSIS — Z992 Dependence on renal dialysis: Secondary | ICD-10-CM | POA: Diagnosis not present

## 2024-03-13 DIAGNOSIS — E8770 Fluid overload, unspecified: Secondary | ICD-10-CM | POA: Diagnosis not present

## 2024-03-13 DIAGNOSIS — N186 End stage renal disease: Secondary | ICD-10-CM | POA: Diagnosis not present

## 2024-03-15 DIAGNOSIS — E8770 Fluid overload, unspecified: Secondary | ICD-10-CM | POA: Diagnosis not present

## 2024-03-15 DIAGNOSIS — Z992 Dependence on renal dialysis: Secondary | ICD-10-CM | POA: Diagnosis not present

## 2024-03-15 DIAGNOSIS — N186 End stage renal disease: Secondary | ICD-10-CM | POA: Diagnosis not present

## 2024-03-17 DIAGNOSIS — E8770 Fluid overload, unspecified: Secondary | ICD-10-CM | POA: Diagnosis not present

## 2024-03-17 DIAGNOSIS — Z992 Dependence on renal dialysis: Secondary | ICD-10-CM | POA: Diagnosis not present

## 2024-03-17 DIAGNOSIS — N186 End stage renal disease: Secondary | ICD-10-CM | POA: Diagnosis not present

## 2024-03-20 DIAGNOSIS — Z992 Dependence on renal dialysis: Secondary | ICD-10-CM | POA: Diagnosis not present

## 2024-03-20 DIAGNOSIS — E8770 Fluid overload, unspecified: Secondary | ICD-10-CM | POA: Diagnosis not present

## 2024-03-20 DIAGNOSIS — N186 End stage renal disease: Secondary | ICD-10-CM | POA: Diagnosis not present

## 2024-03-22 DIAGNOSIS — C61 Malignant neoplasm of prostate: Secondary | ICD-10-CM | POA: Diagnosis not present

## 2024-03-22 DIAGNOSIS — Z992 Dependence on renal dialysis: Secondary | ICD-10-CM | POA: Diagnosis not present

## 2024-03-22 DIAGNOSIS — N186 End stage renal disease: Secondary | ICD-10-CM | POA: Diagnosis not present

## 2024-03-22 DIAGNOSIS — E8770 Fluid overload, unspecified: Secondary | ICD-10-CM | POA: Diagnosis not present

## 2024-03-23 ENCOUNTER — Other Ambulatory Visit (INDEPENDENT_AMBULATORY_CARE_PROVIDER_SITE_OTHER): Payer: Self-pay | Admitting: Nurse Practitioner

## 2024-03-24 DIAGNOSIS — E8770 Fluid overload, unspecified: Secondary | ICD-10-CM | POA: Diagnosis not present

## 2024-03-24 DIAGNOSIS — Z992 Dependence on renal dialysis: Secondary | ICD-10-CM | POA: Diagnosis not present

## 2024-03-24 DIAGNOSIS — N186 End stage renal disease: Secondary | ICD-10-CM | POA: Diagnosis not present

## 2024-03-27 DIAGNOSIS — N186 End stage renal disease: Secondary | ICD-10-CM | POA: Diagnosis not present

## 2024-03-27 DIAGNOSIS — E8770 Fluid overload, unspecified: Secondary | ICD-10-CM | POA: Diagnosis not present

## 2024-03-27 DIAGNOSIS — Z992 Dependence on renal dialysis: Secondary | ICD-10-CM | POA: Diagnosis not present

## 2024-03-29 DIAGNOSIS — E8770 Fluid overload, unspecified: Secondary | ICD-10-CM | POA: Diagnosis not present

## 2024-03-29 DIAGNOSIS — Z992 Dependence on renal dialysis: Secondary | ICD-10-CM | POA: Diagnosis not present

## 2024-03-29 DIAGNOSIS — N186 End stage renal disease: Secondary | ICD-10-CM | POA: Diagnosis not present

## 2024-03-30 DIAGNOSIS — Z992 Dependence on renal dialysis: Secondary | ICD-10-CM | POA: Diagnosis not present

## 2024-03-30 DIAGNOSIS — E8770 Fluid overload, unspecified: Secondary | ICD-10-CM | POA: Diagnosis not present

## 2024-03-30 DIAGNOSIS — N186 End stage renal disease: Secondary | ICD-10-CM | POA: Diagnosis not present

## 2024-03-31 DIAGNOSIS — Z885 Allergy status to narcotic agent status: Secondary | ICD-10-CM | POA: Diagnosis not present

## 2024-03-31 DIAGNOSIS — Z992 Dependence on renal dialysis: Secondary | ICD-10-CM | POA: Diagnosis not present

## 2024-03-31 DIAGNOSIS — T82898A Other specified complication of vascular prosthetic devices, implants and grafts, initial encounter: Secondary | ICD-10-CM | POA: Diagnosis not present

## 2024-03-31 DIAGNOSIS — E8770 Fluid overload, unspecified: Secondary | ICD-10-CM | POA: Diagnosis not present

## 2024-03-31 DIAGNOSIS — N186 End stage renal disease: Secondary | ICD-10-CM | POA: Diagnosis not present

## 2024-03-31 NOTE — H&P (Signed)
-------------------------------------------------------------------------------   Attestation signed by Debarah Lang Manis, MD at 03/31/24 1706 I saw and evaluated the patient, participating in the key portions of the service.  I reviewed this note.  I agree with the findings and plan.   Benjamin C Wood, MD Assistant Professor Division of Vascular Surgery  -------------------------------------------------------------------------------  Brief Pre-operative History & Physical  Patient name: Benjamin Stewart CSN: [id number] MRN: [id number] Admit Date: 03/31/2024 Date of Surgery: 03/31/24    Assessment/Plan:   Garl is a 48 y.o. male with ESRD and LUE AVF which is pulling clots. Presents for fistulagram today.   Due to the diagnosis of arteriovenous access dysfunction, left fistulagram was offered to the patient. I discussed the risks, benefits, and alternatives to this procedure with the patient. The material risks, as discussed, include but are not limited to MI, bleeding, infection, need for further interventions, access failure, hand ischemia, over-sedation with cardiopulmonary compromise. Alternative procedures including abandoning the access for a new site or catheter were discussed. The patient's questions were answered to their satisfaction and they demonstrated a general understanding of the above. The patient participated in shared decision-making. Informed, written consent was obtained and documented in the patient's chart.   History of Present Illness:  Benjamin Stewart is a 48 y.o. male with ESRD and recent BB AVF creation and superficialization . After attempted use he was pulling clots and they were not able to use it for HD. Otherwise no issues.    Allergies Glipizide and Percocet [oxycodone-acetaminophen ]  Medications   Current Medications[1]  Vital Signs Ht 177.8 cm (5' 10)   Wt (!) 140.6 kg (310 lb)   BMI 44.48 kg/m  Facility age limit for growth  %iles is 20 years. Facility age limit for growth %iles is 20 years..   Physical Exam General: Well developed, appears stated age, in no acute distress Mental status: Alert and oriented x3 Cardiovascular: Normal Pulmonary: Symmetric chest rise, unlabored breathing LUE: AVF with small pulsatile proximal aneurysm and palpable thrill distally  Labs and Studies: Lab Results  Component Value Date   WBC 11.5 (H) 01/26/2023   HGB 16.0 01/26/2023   HCT 47.8 01/26/2023   PLT 289 01/26/2023    No results found for: PT, INR, APTT        [1] No current facility-administered medications for this encounter.

## 2024-03-31 NOTE — Progress Notes (Signed)
 The following criteria has been met per discharge order:  1. Vital signs completed and stable. 2. No respiratory difficulty.   3. No bleeding or hematoma at procedure site.  4. When patient is ambulatory, if bed rest is applicable. 5. When Aldrete score criteria has been met.  6. Significant pain is absent.  7. An escort is available.  8. Postsurgical care have been reviewed with the patient and escort. Understanding was verbalized. 9. Written postoperative instructions have been provided.  10. Follow-up appointment has been scheduled, if applicable.

## 2024-04-03 DIAGNOSIS — Z992 Dependence on renal dialysis: Secondary | ICD-10-CM | POA: Diagnosis not present

## 2024-04-03 DIAGNOSIS — N186 End stage renal disease: Secondary | ICD-10-CM | POA: Diagnosis not present

## 2024-04-03 DIAGNOSIS — E8770 Fluid overload, unspecified: Secondary | ICD-10-CM | POA: Diagnosis not present

## 2024-04-05 DIAGNOSIS — N186 End stage renal disease: Secondary | ICD-10-CM | POA: Diagnosis not present

## 2024-04-05 DIAGNOSIS — Z992 Dependence on renal dialysis: Secondary | ICD-10-CM | POA: Diagnosis not present

## 2024-04-07 DIAGNOSIS — Z992 Dependence on renal dialysis: Secondary | ICD-10-CM | POA: Diagnosis not present

## 2024-04-07 DIAGNOSIS — E8770 Fluid overload, unspecified: Secondary | ICD-10-CM | POA: Diagnosis not present

## 2024-04-07 DIAGNOSIS — N186 End stage renal disease: Secondary | ICD-10-CM | POA: Diagnosis not present

## 2024-04-09 DIAGNOSIS — N186 End stage renal disease: Secondary | ICD-10-CM | POA: Diagnosis not present

## 2024-04-09 DIAGNOSIS — Z992 Dependence on renal dialysis: Secondary | ICD-10-CM | POA: Diagnosis not present

## 2024-04-10 DIAGNOSIS — N186 End stage renal disease: Secondary | ICD-10-CM | POA: Diagnosis not present

## 2024-04-10 DIAGNOSIS — Z992 Dependence on renal dialysis: Secondary | ICD-10-CM | POA: Diagnosis not present

## 2024-04-10 DIAGNOSIS — E8779 Other fluid overload: Secondary | ICD-10-CM | POA: Diagnosis not present

## 2024-04-10 DIAGNOSIS — E8770 Fluid overload, unspecified: Secondary | ICD-10-CM | POA: Diagnosis not present

## 2024-04-12 ENCOUNTER — Other Ambulatory Visit: Payer: Self-pay | Admitting: Nephrology

## 2024-04-12 DIAGNOSIS — D582 Other hemoglobinopathies: Secondary | ICD-10-CM

## 2024-04-12 DIAGNOSIS — N186 End stage renal disease: Secondary | ICD-10-CM | POA: Diagnosis not present

## 2024-04-12 DIAGNOSIS — Z992 Dependence on renal dialysis: Secondary | ICD-10-CM | POA: Diagnosis not present

## 2024-04-12 DIAGNOSIS — E8779 Other fluid overload: Secondary | ICD-10-CM | POA: Diagnosis not present

## 2024-04-12 DIAGNOSIS — E8770 Fluid overload, unspecified: Secondary | ICD-10-CM | POA: Diagnosis not present

## 2024-04-13 DIAGNOSIS — N186 End stage renal disease: Secondary | ICD-10-CM | POA: Diagnosis not present

## 2024-04-13 DIAGNOSIS — E8770 Fluid overload, unspecified: Secondary | ICD-10-CM | POA: Diagnosis not present

## 2024-04-13 DIAGNOSIS — E8779 Other fluid overload: Secondary | ICD-10-CM | POA: Diagnosis not present

## 2024-04-13 DIAGNOSIS — Z992 Dependence on renal dialysis: Secondary | ICD-10-CM | POA: Diagnosis not present

## 2024-04-14 DIAGNOSIS — E8770 Fluid overload, unspecified: Secondary | ICD-10-CM | POA: Diagnosis not present

## 2024-04-14 DIAGNOSIS — E8779 Other fluid overload: Secondary | ICD-10-CM | POA: Diagnosis not present

## 2024-04-14 DIAGNOSIS — Z992 Dependence on renal dialysis: Secondary | ICD-10-CM | POA: Diagnosis not present

## 2024-04-14 DIAGNOSIS — N186 End stage renal disease: Secondary | ICD-10-CM | POA: Diagnosis not present

## 2024-04-17 DIAGNOSIS — E8770 Fluid overload, unspecified: Secondary | ICD-10-CM | POA: Diagnosis not present

## 2024-04-17 DIAGNOSIS — Z992 Dependence on renal dialysis: Secondary | ICD-10-CM | POA: Diagnosis not present

## 2024-04-17 DIAGNOSIS — E8779 Other fluid overload: Secondary | ICD-10-CM | POA: Diagnosis not present

## 2024-04-17 DIAGNOSIS — N186 End stage renal disease: Secondary | ICD-10-CM | POA: Diagnosis not present

## 2024-04-19 DIAGNOSIS — N186 End stage renal disease: Secondary | ICD-10-CM | POA: Diagnosis not present

## 2024-04-19 DIAGNOSIS — E8770 Fluid overload, unspecified: Secondary | ICD-10-CM | POA: Diagnosis not present

## 2024-04-19 DIAGNOSIS — E8779 Other fluid overload: Secondary | ICD-10-CM | POA: Diagnosis not present

## 2024-04-19 DIAGNOSIS — Z992 Dependence on renal dialysis: Secondary | ICD-10-CM | POA: Diagnosis not present

## 2024-04-20 ENCOUNTER — Ambulatory Visit
Admission: RE | Admit: 2024-04-20 | Discharge: 2024-04-20 | Disposition: A | Discharge status: Home / Self Care | Source: Ambulatory Visit | Attending: Nephrology | Admitting: Nephrology

## 2024-04-20 DIAGNOSIS — R93422 Abnormal radiologic findings on diagnostic imaging of left kidney: Secondary | ICD-10-CM | POA: Insufficient documentation

## 2024-04-20 DIAGNOSIS — Z992 Dependence on renal dialysis: Secondary | ICD-10-CM | POA: Diagnosis not present

## 2024-04-20 DIAGNOSIS — R93421 Abnormal radiologic findings on diagnostic imaging of right kidney: Secondary | ICD-10-CM | POA: Diagnosis not present

## 2024-04-20 DIAGNOSIS — E8779 Other fluid overload: Secondary | ICD-10-CM | POA: Diagnosis not present

## 2024-04-20 DIAGNOSIS — D582 Other hemoglobinopathies: Secondary | ICD-10-CM | POA: Diagnosis not present

## 2024-04-20 DIAGNOSIS — N186 End stage renal disease: Secondary | ICD-10-CM | POA: Diagnosis not present

## 2024-04-20 DIAGNOSIS — E8770 Fluid overload, unspecified: Secondary | ICD-10-CM | POA: Diagnosis not present

## 2024-04-21 DIAGNOSIS — N186 End stage renal disease: Secondary | ICD-10-CM | POA: Diagnosis not present

## 2024-04-21 DIAGNOSIS — E8779 Other fluid overload: Secondary | ICD-10-CM | POA: Diagnosis not present

## 2024-04-21 DIAGNOSIS — E8770 Fluid overload, unspecified: Secondary | ICD-10-CM | POA: Diagnosis not present

## 2024-04-21 DIAGNOSIS — Z992 Dependence on renal dialysis: Secondary | ICD-10-CM | POA: Diagnosis not present

## 2024-04-24 DIAGNOSIS — E8779 Other fluid overload: Secondary | ICD-10-CM | POA: Diagnosis not present

## 2024-04-24 DIAGNOSIS — Z992 Dependence on renal dialysis: Secondary | ICD-10-CM | POA: Diagnosis not present

## 2024-04-24 DIAGNOSIS — E8770 Fluid overload, unspecified: Secondary | ICD-10-CM | POA: Diagnosis not present

## 2024-04-24 DIAGNOSIS — N186 End stage renal disease: Secondary | ICD-10-CM | POA: Diagnosis not present

## 2024-04-26 DIAGNOSIS — N186 End stage renal disease: Secondary | ICD-10-CM | POA: Diagnosis not present

## 2024-04-26 DIAGNOSIS — E8779 Other fluid overload: Secondary | ICD-10-CM | POA: Diagnosis not present

## 2024-04-26 DIAGNOSIS — Z992 Dependence on renal dialysis: Secondary | ICD-10-CM | POA: Diagnosis not present

## 2024-04-26 DIAGNOSIS — E8770 Fluid overload, unspecified: Secondary | ICD-10-CM | POA: Diagnosis not present

## 2024-04-27 DIAGNOSIS — Z992 Dependence on renal dialysis: Secondary | ICD-10-CM | POA: Diagnosis not present

## 2024-04-27 DIAGNOSIS — E8770 Fluid overload, unspecified: Secondary | ICD-10-CM | POA: Diagnosis not present

## 2024-04-27 DIAGNOSIS — E8779 Other fluid overload: Secondary | ICD-10-CM | POA: Diagnosis not present

## 2024-04-27 DIAGNOSIS — N186 End stage renal disease: Secondary | ICD-10-CM | POA: Diagnosis not present

## 2024-04-28 DIAGNOSIS — N186 End stage renal disease: Secondary | ICD-10-CM | POA: Diagnosis not present

## 2024-04-28 DIAGNOSIS — Z992 Dependence on renal dialysis: Secondary | ICD-10-CM | POA: Diagnosis not present

## 2024-05-01 DIAGNOSIS — Z992 Dependence on renal dialysis: Secondary | ICD-10-CM | POA: Diagnosis not present

## 2024-05-01 DIAGNOSIS — N186 End stage renal disease: Secondary | ICD-10-CM | POA: Diagnosis not present

## 2024-05-03 DIAGNOSIS — Z992 Dependence on renal dialysis: Secondary | ICD-10-CM | POA: Diagnosis not present

## 2024-05-03 DIAGNOSIS — N186 End stage renal disease: Secondary | ICD-10-CM | POA: Diagnosis not present

## 2024-05-05 DIAGNOSIS — Z992 Dependence on renal dialysis: Secondary | ICD-10-CM | POA: Diagnosis not present

## 2024-05-05 DIAGNOSIS — N186 End stage renal disease: Secondary | ICD-10-CM | POA: Diagnosis not present

## 2024-05-08 DIAGNOSIS — Z992 Dependence on renal dialysis: Secondary | ICD-10-CM | POA: Diagnosis not present

## 2024-05-08 DIAGNOSIS — N186 End stage renal disease: Secondary | ICD-10-CM | POA: Diagnosis not present

## 2024-05-09 DIAGNOSIS — Z992 Dependence on renal dialysis: Secondary | ICD-10-CM | POA: Diagnosis not present

## 2024-05-09 DIAGNOSIS — N186 End stage renal disease: Secondary | ICD-10-CM | POA: Diagnosis not present

## 2024-05-10 DIAGNOSIS — Z992 Dependence on renal dialysis: Secondary | ICD-10-CM | POA: Diagnosis not present

## 2024-05-10 DIAGNOSIS — N186 End stage renal disease: Secondary | ICD-10-CM | POA: Diagnosis not present

## 2024-05-10 DIAGNOSIS — E8779 Other fluid overload: Secondary | ICD-10-CM | POA: Diagnosis not present

## 2024-05-11 ENCOUNTER — Ambulatory Visit (INDEPENDENT_AMBULATORY_CARE_PROVIDER_SITE_OTHER): Admitting: Internal Medicine

## 2024-05-11 ENCOUNTER — Encounter: Payer: Self-pay | Admitting: Internal Medicine

## 2024-05-11 VITALS — BP 138/76 | HR 101 | Ht 70.5 in | Wt 315.0 lb

## 2024-05-11 DIAGNOSIS — E782 Mixed hyperlipidemia: Secondary | ICD-10-CM | POA: Diagnosis not present

## 2024-05-11 DIAGNOSIS — R739 Hyperglycemia, unspecified: Secondary | ICD-10-CM | POA: Insufficient documentation

## 2024-05-11 DIAGNOSIS — N186 End stage renal disease: Secondary | ICD-10-CM | POA: Diagnosis not present

## 2024-05-11 DIAGNOSIS — Z23 Encounter for immunization: Secondary | ICD-10-CM | POA: Diagnosis not present

## 2024-05-11 DIAGNOSIS — I10 Essential (primary) hypertension: Secondary | ICD-10-CM

## 2024-05-11 LAB — POCT GLYCOSYLATED HEMOGLOBIN (HGB A1C): Hemoglobin A1C: 5.9 % — AB (ref 4.0–5.6)

## 2024-05-11 NOTE — Assessment & Plan Note (Signed)
 Doing well on HD. Labs monitored by Nephrology

## 2024-05-11 NOTE — Assessment & Plan Note (Addendum)
 Blood sugars improved with diet.  Previous A1c was in diabetic range so he cut out sugary soda. A1C today = 5.9

## 2024-05-11 NOTE — Progress Notes (Signed)
 Date:  05/11/2024   Name:  Benjamin Stewart   DOB:  30-Jul-1976   MRN:  [id number]   Chief Complaint: Hypertension and Diabetes  Hypertension This is a chronic problem. The problem is controlled. Pertinent negatives include no chest pain, headaches, palpitations or shortness of breath.  Diabetes He presents for his follow-up diabetic visit. His disease course has been stable. Pertinent negatives for hypoglycemia include no dizziness or headaches. Pertinent negatives for diabetes include no chest pain, no fatigue and no weakness.  ESRD on HD - now at [facility] in [place]; [facility] had too much patient drama.  They have been using in left arm AVG for the past month with good access.  Review of Systems  Constitutional:  Negative for fatigue and unexpected weight change.  HENT:  Negative for nosebleeds.   Eyes:  Negative for visual disturbance.  Respiratory:  Negative for cough, chest tightness, shortness of breath and wheezing.   Cardiovascular:  Negative for chest pain, palpitations and leg swelling.  Gastrointestinal:  Negative for abdominal pain, constipation and diarrhea.  Neurological:  Negative for dizziness, weakness, light-headedness and headaches.     Lab Results  Component Value Date   NA 138 11/30/2022   K 5.7 (H) 11/30/2022   CO2 18 (L) 11/30/2022   GLUCOSE 98 11/30/2022   BUN 66 (H) 11/30/2022   CREATININE 12.17 (H) 11/30/2022   CALCIUM  9.1 11/30/2022   EGFR 5 (L) 11/30/2022   GFRNONAA 5 (L) 05/07/2021   Lab Results  Component Value Date   CHOL 131 12/01/2023   HDL 35 (L) 12/01/2023   LDLCALC 71 12/01/2023   TRIG 142 12/01/2023   CHOLHDL 3.7 12/01/2023   Lab Results  Component Value Date   TSH 1.590 12/01/2023   Lab Results  Component Value Date   HGBA1C 5.9 (A) 05/11/2024   Lab Results  Component Value Date   WBC 9.4 07/19/2023   HGB 12.9 (A) 07/19/2023   HCT 42 07/19/2023   MCV 91 11/30/2022   PLT 345 07/19/2023   Lab Results  Component  Value Date   ALT 14 07/19/2023   AST 14 07/19/2023   ALKPHOS 62 07/19/2023   BILITOT 0.6 11/30/2022   No results found for: MARIEN BOLLS, VD25OH   Patient Active Problem List   Diagnosis Date Noted   Hyperglycemia 05/11/2024   Acquired thrombophilia 12/01/2023   Arteriovenous fistula, acquired 02/03/2023   Calculus of gallbladder and bile duct without cholecystitis or obstruction 02/03/2023   Obesity, morbid (HCC) 11/20/2020   End stage renal disease (HCC) 04/11/2020   Secondary hyperparathyroidism of renal origin 04/24/2019   Localized edema 01/19/2019   Chronic gout of multiple sites 08/16/2018   Prostate cancer (HCC) 08/23/2015   Hyperlipidemia, mixed 07/16/2015   Essential (primary) hypertension 12/04/2014    Allergies  Allergen Reactions   Glimepiride  Other (See Comments)    Severe hypoglycemia   Other    Oxycodone Hcl Nausea And Vomiting    Past Surgical History:  Procedure Laterality Date   A/V FISTULAGRAM Left 05/30/2020   Procedure: A/V FISTULAGRAM;  Surgeon: Marea Selinda RAMAN, MD;  Location: [facility];  Service: Cardiovascular;  Laterality: Left;   AV FISTULA PLACEMENT Left 03/07/2020   Procedure: ARTERIOVENOUS (AV) FISTULA CREATION BRACHIAL CEPHALIC;  Surgeon: Marea Selinda RAMAN, MD;  Location: [facility];  Service: Vascular;  Laterality: Left;   DIALYSIS/PERMA CATHETER INSERTION N/A 01/17/2020   Procedure: DIALYSIS/PERMA CATHETER INSERTION;  Surgeon: Marea Selinda RAMAN, MD;  Location: [facility];  Service: Cardiovascular;  Laterality: N/A;   DIALYSIS/PERMA CATHETER REMOVAL N/A 10/07/2020   Procedure: DIALYSIS/PERMA CATHETER REMOVAL;  Surgeon: Marea Selinda RAMAN, MD;  Location: [facility];  Service: Cardiovascular;  Laterality: N/A;   PARTIAL COLECTOMY  08/11/2003   RADIOACTIVE SEED IMPLANT N/A 12/06/2017   Procedure: RADIOACTIVE SEED IMPLANT/BRACHYTHERAPY IMPLANT;  Surgeon: Penne Knee, MD;  Location: [facility];  Service: Urology;   Laterality: N/A;   SMALL INTESTINE SURGERY  05/2004   UPPER EXTREMITY ANGIOGRAPHY Left 04/18/2020   Procedure: UPPER EXTREMITY ANGIOGRAPHY;  Surgeon: Marea Selinda RAMAN, MD;  Location: [facility];  Service: Cardiovascular;  Laterality: Left;    Social History   Tobacco Use   Smoking status: Never   Smokeless tobacco: Never  Vaping Use   Vaping status: Never Used  Substance Use Topics   Alcohol use: No   Drug use: No     Medication list has been reviewed and updated.  Current Meds  Medication Sig   amLODipine  (NORVASC ) 10 MG tablet 10 mg daily.   ASPIRIN  LOW DOSE 81 MG tablet TAKE 1 TABLET BY MOUTH DAILY   atorvastatin  (LIPITOR) 10 MG tablet Take 1 tablet (10 mg total) by mouth daily.   calcitRIOL  (ROCALTROL ) 0.25 MCG capsule Take 0.25 mcg by mouth 3 (three) times a week.   calcium  acetate (PHOSLO) 667 MG capsule Take 2,001 mg by mouth 3 (three) times daily. (Patient taking differently: Take 2,001 mg by mouth 3 (three) times daily. Taking 3 tablet twice a day)   carvedilol  (COREG ) 6.25 MG tablet Take 6.25 mg by mouth 2 (two) times daily with a meal.    cinacalcet  (SENSIPAR ) 30 MG tablet Take 30 mg by mouth 3 (three) times a week.   clopidogrel  (PLAVIX ) 75 MG tablet Take 75 mg by mouth daily.   furosemide (LASIX) 80 MG tablet Take 80 mg by mouth daily.   midodrine (PROAMATINE) 5 MG tablet Take 5 mg by mouth as needed.   XPHOZAH 30 MG TABS Take by mouth. (Patient taking differently: Take by mouth. Takes 1 tablet twice a day with Phoslo)       05/11/2024   10:59 AM 12/01/2023    8:36 AM 02/03/2023    1:41 PM 11/30/2022    9:12 AM  GAD 7 : Generalized Anxiety Score  Nervous, Anxious, on Edge 0 0 0 0  Control/stop worrying 0 0 0 0  Worry too much - different things 0 0 0 0  Trouble relaxing 0 0 0 0  Restless 0 0 0 0  Easily annoyed or irritable 0 0 0 0  Afraid - awful might happen 0 0 0 0  Total GAD 7 Score 0 0 0 0  Anxiety Difficulty Not difficult at all Not difficult at  all Not difficult at all Not difficult at all       05/11/2024   10:56 AM 03/02/2024    4:00 PM 12/01/2023    8:36 AM  Depression screen PHQ 2/9  Decreased Interest 0 0 0  Down, Depressed, Hopeless 0 0 0  PHQ - 2 Score 0 0 0  Altered sleeping 0 0 0  Tired, decreased energy 0 0 0  Change in appetite 0 0 0  Feeling bad or failure about yourself  0 0 0  Trouble concentrating 0 0 0  Moving slowly or fidgety/restless 0 0 0  Suicidal thoughts 0 0 0  PHQ-9 Score 0 0 0  Difficult doing  work/chores Not difficult at all Not difficult at all Not difficult at all    BP Readings from Last 3 Encounters:  05/11/24 138/76  12/01/23 114/72  06/02/23 118/70    Physical Exam Vitals and nursing note reviewed.  Constitutional:      General: He is not in acute distress.    Appearance: Normal appearance. He is well-developed.  HENT:     Head: Normocephalic and atraumatic.  Neck:     Vascular: No carotid bruit.  Cardiovascular:     Rate and Rhythm: Normal rate and regular rhythm.     Heart sounds: No murmur heard.    Arteriovenous access: Left arteriovenous access is present.  Pulmonary:     Effort: Pulmonary effort is normal. No respiratory distress.     Breath sounds: No wheezing or rhonchi.  Chest:       Comments: HD port Musculoskeletal:     Cervical back: Normal range of motion.  Lymphadenopathy:     Cervical: No cervical adenopathy.  Skin:    General: Skin is warm and dry.     Findings: No rash.  Neurological:     Mental Status: He is alert and oriented to person, place, and time.  Psychiatric:        Mood and Affect: Mood normal.        Behavior: Behavior normal.     Wt Readings from Last 3 Encounters:  05/11/24 (!) 315 lb (142.9 kg)  03/02/24 (!) 302 lb (137 kg)  12/01/23 298 lb 4 oz (135.3 kg)    BP 138/76   Pulse (!) 101   Ht 5' 10.5 (1.791 m)   Wt (!) 315 lb (142.9 kg)   SpO2 96%   BMI 44.56 kg/m   Assessment and Plan:  Problem List Items Addressed  This Visit       Unprioritized   End stage renal disease (HCC) (Chronic)   Doing well on HD. Labs monitored by Nephrology      Essential (primary) hypertension - Primary (Chronic)   Well controlled blood pressure today. Current regimen is Coreg  and amlodipine . No medication side effects noted. He continues on HD for ESRD        Hyperglycemia   Blood sugars improved with diet.  Previous A1c was in diabetic range so he cut out sugary soda. A1C today = 5.9      Relevant Orders   POCT glycosylated hemoglobin (Hb A1C) (Completed)   Hyperlipidemia, mixed (Chronic)   LDL is  Lab Results  Component Value Date   LDLCALC 71 12/01/2023    Current medication regimen is atorvastatin . Goal LDL is < 70.       Other Visit Diagnoses       Encounter for immunization       Relevant Orders   Flu vaccine trivalent PF, 6mos and older(Flulaval,Afluria,Fluarix,Fluzone) (Completed)       Return in about 6 months (around 11/09/2024) for [facility] CPX  Dr. Gwenevere.    Leita HILARIO Adie, MD [facility] [place]

## 2024-05-11 NOTE — Assessment & Plan Note (Signed)
 Well controlled blood pressure today. Current regimen is Coreg  and amlodipine . No medication side effects noted. He continues on HD for ESRD

## 2024-05-11 NOTE — Assessment & Plan Note (Signed)
 LDL is  Lab Results  Component Value Date   LDLCALC 71 12/01/2023    Current medication regimen is atorvastatin . Goal LDL is < 70.

## 2024-05-12 DIAGNOSIS — E8779 Other fluid overload: Secondary | ICD-10-CM | POA: Diagnosis not present

## 2024-05-12 DIAGNOSIS — Z992 Dependence on renal dialysis: Secondary | ICD-10-CM | POA: Diagnosis not present

## 2024-05-15 DIAGNOSIS — N186 End stage renal disease: Secondary | ICD-10-CM | POA: Diagnosis not present

## 2024-05-17 ENCOUNTER — Ambulatory Visit: Admitting: Internal Medicine

## 2024-05-22 DIAGNOSIS — R972 Elevated prostate specific antigen [PSA]: Secondary | ICD-10-CM | POA: Diagnosis not present

## 2024-05-22 DIAGNOSIS — C61 Malignant neoplasm of prostate: Secondary | ICD-10-CM | POA: Diagnosis not present

## 2024-05-22 DIAGNOSIS — Z992 Dependence on renal dialysis: Secondary | ICD-10-CM | POA: Diagnosis not present

## 2024-05-22 DIAGNOSIS — E1122 Type 2 diabetes mellitus with diabetic chronic kidney disease: Secondary | ICD-10-CM | POA: Diagnosis not present

## 2024-05-22 DIAGNOSIS — N186 End stage renal disease: Secondary | ICD-10-CM | POA: Diagnosis not present

## 2024-05-24 DIAGNOSIS — E8779 Other fluid overload: Secondary | ICD-10-CM | POA: Diagnosis not present

## 2024-05-24 DIAGNOSIS — Z992 Dependence on renal dialysis: Secondary | ICD-10-CM | POA: Diagnosis not present

## 2024-05-24 DIAGNOSIS — N186 End stage renal disease: Secondary | ICD-10-CM | POA: Diagnosis not present

## 2024-05-25 DIAGNOSIS — D631 Anemia in chronic kidney disease: Secondary | ICD-10-CM | POA: Diagnosis not present

## 2024-05-25 DIAGNOSIS — E785 Hyperlipidemia, unspecified: Secondary | ICD-10-CM | POA: Diagnosis not present

## 2024-05-25 DIAGNOSIS — M1030 Gout due to renal impairment, unspecified site: Secondary | ICD-10-CM | POA: Diagnosis not present

## 2024-05-25 DIAGNOSIS — Z452 Encounter for adjustment and management of vascular access device: Secondary | ICD-10-CM | POA: Diagnosis not present

## 2024-05-25 DIAGNOSIS — I120 Hypertensive chronic kidney disease with stage 5 chronic kidney disease or end stage renal disease: Secondary | ICD-10-CM | POA: Diagnosis not present

## 2024-05-25 DIAGNOSIS — E1122 Type 2 diabetes mellitus with diabetic chronic kidney disease: Secondary | ICD-10-CM | POA: Diagnosis not present

## 2024-05-25 DIAGNOSIS — N186 End stage renal disease: Secondary | ICD-10-CM | POA: Diagnosis not present

## 2024-05-25 DIAGNOSIS — Z7984 Long term (current) use of oral hypoglycemic drugs: Secondary | ICD-10-CM | POA: Diagnosis not present

## 2024-05-25 DIAGNOSIS — Z992 Dependence on renal dialysis: Secondary | ICD-10-CM | POA: Diagnosis not present

## 2024-05-26 DIAGNOSIS — E8779 Other fluid overload: Secondary | ICD-10-CM | POA: Diagnosis not present

## 2024-05-26 DIAGNOSIS — Z992 Dependence on renal dialysis: Secondary | ICD-10-CM | POA: Diagnosis not present

## 2024-05-26 DIAGNOSIS — N186 End stage renal disease: Secondary | ICD-10-CM | POA: Diagnosis not present

## 2024-05-27 DIAGNOSIS — E8779 Other fluid overload: Secondary | ICD-10-CM | POA: Diagnosis not present

## 2024-06-05 DIAGNOSIS — N186 End stage renal disease: Secondary | ICD-10-CM | POA: Diagnosis not present

## 2024-06-05 DIAGNOSIS — Z992 Dependence on renal dialysis: Secondary | ICD-10-CM | POA: Diagnosis not present

## 2024-06-05 DIAGNOSIS — E8779 Other fluid overload: Secondary | ICD-10-CM | POA: Diagnosis not present

## 2024-06-09 DIAGNOSIS — N186 End stage renal disease: Secondary | ICD-10-CM | POA: Diagnosis not present

## 2024-06-09 DIAGNOSIS — Z992 Dependence on renal dialysis: Secondary | ICD-10-CM | POA: Diagnosis not present

## 2024-06-09 DIAGNOSIS — E8779 Other fluid overload: Secondary | ICD-10-CM | POA: Diagnosis not present

## 2024-06-10 DIAGNOSIS — N186 End stage renal disease: Secondary | ICD-10-CM | POA: Diagnosis not present

## 2024-06-10 DIAGNOSIS — Z992 Dependence on renal dialysis: Secondary | ICD-10-CM | POA: Diagnosis not present

## 2024-06-12 DIAGNOSIS — N186 End stage renal disease: Secondary | ICD-10-CM | POA: Diagnosis not present

## 2024-06-14 DIAGNOSIS — Z992 Dependence on renal dialysis: Secondary | ICD-10-CM | POA: Diagnosis not present

## 2024-06-16 DIAGNOSIS — N186 End stage renal disease: Secondary | ICD-10-CM | POA: Diagnosis not present

## 2024-06-19 DIAGNOSIS — Z992 Dependence on renal dialysis: Secondary | ICD-10-CM | POA: Diagnosis not present

## 2024-06-22 DIAGNOSIS — Z992 Dependence on renal dialysis: Secondary | ICD-10-CM | POA: Diagnosis not present

## 2024-06-30 DIAGNOSIS — N186 End stage renal disease: Secondary | ICD-10-CM | POA: Diagnosis not present

## 2024-07-03 DIAGNOSIS — Z992 Dependence on renal dialysis: Secondary | ICD-10-CM | POA: Diagnosis not present

## 2024-07-04 ENCOUNTER — Encounter: Payer: Self-pay | Admitting: Internal Medicine

## 2024-07-04 ENCOUNTER — Ambulatory Visit (INDEPENDENT_AMBULATORY_CARE_PROVIDER_SITE_OTHER): Admitting: Internal Medicine

## 2024-07-04 VITALS — BP 104/86 | HR 95 | Ht 70.5 in | Wt 315.0 lb

## 2024-07-04 DIAGNOSIS — M25551 Pain in right hip: Secondary | ICD-10-CM | POA: Diagnosis not present

## 2024-07-04 DIAGNOSIS — I120 Hypertensive chronic kidney disease with stage 5 chronic kidney disease or end stage renal disease: Secondary | ICD-10-CM | POA: Diagnosis not present

## 2024-07-04 DIAGNOSIS — N186 End stage renal disease: Secondary | ICD-10-CM | POA: Insufficient documentation

## 2024-07-04 NOTE — Patient Instructions (Signed)
 Tylenol  650 mg every 8 hours  Apply heat or ice which ever helps the most

## 2024-07-04 NOTE — Assessment & Plan Note (Signed)
 BP controlled on amlodipine   On HD and tolerating it well. Followed closely by Nephrology He has LD disability papers to be updated but no prior in chart  He will check to see if Nephrology completed these initially

## 2024-07-04 NOTE — Progress Notes (Signed)
 Date:  07/04/2024   Name:  Benjamin Stewart   DOB:  11/08/1975   MRN:  [id number]   Chief Complaint: Abdominal Pain (X1-2 days, Lower abdominal, shoots to groin area, 5 on pain scale, no medication tried, dull pain sharp when moving a certain way)  Hip Pain  The incident occurred 3 to 5 days ago. There was no injury mechanism. The pain is present in the right hip. The quality of the pain is described as aching and shooting. The pain is mild. The pain has been Fluctuating since onset. Pertinent negatives include no inability to bear weight, muscle weakness, numbness or tingling. The symptoms are aggravated by movement. He has tried nothing for the symptoms.    Review of Systems  Constitutional:  Negative for chills and fatigue.  Respiratory:  Negative for chest tightness and shortness of breath.   Cardiovascular:  Negative for chest pain.  Gastrointestinal:  Negative for abdominal pain, constipation, diarrhea and nausea.  Genitourinary:  Negative for hematuria.  Musculoskeletal:  Positive for arthralgias. Negative for gait problem.  Neurological:  Negative for tingling and numbness.  Psychiatric/Behavioral:  Negative for sleep disturbance.      Lab Results  Component Value Date   NA 138 11/30/2022   K 5.7 (H) 11/30/2022   CO2 18 (L) 11/30/2022   GLUCOSE 98 11/30/2022   BUN 66 (H) 11/30/2022   CREATININE 12.17 (H) 11/30/2022   CALCIUM  9.1 11/30/2022   EGFR 5 (L) 11/30/2022   GFRNONAA 5 (L) 05/07/2021   Lab Results  Component Value Date   CHOL 131 12/01/2023   HDL 35 (L) 12/01/2023   LDLCALC 71 12/01/2023   TRIG 142 12/01/2023   CHOLHDL 3.7 12/01/2023   Lab Results  Component Value Date   TSH 1.590 12/01/2023   Lab Results  Component Value Date   HGBA1C 5.9 (A) 05/11/2024   Lab Results  Component Value Date   WBC 9.4 07/19/2023   HGB 12.9 (A) 07/19/2023   HCT 42 07/19/2023   MCV 91 11/30/2022   PLT 345 07/19/2023   Lab Results  Component Value Date   ALT 14  07/19/2023   AST 14 07/19/2023   ALKPHOS 62 07/19/2023   BILITOT 0.6 11/30/2022   No results found for: MARIEN BOLLS, VD25OH   Patient Active Problem List   Diagnosis Date Noted   End-stage renal disease (HCC) 07/04/2024   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) 07/04/2024   Hyperglycemia 05/11/2024   Acquired thrombophilia 12/01/2023   Arteriovenous fistula, acquired 02/03/2023   Calculus of gallbladder and bile duct without cholecystitis or obstruction 02/03/2023   Obesity, morbid (HCC) 11/20/2020   Secondary hyperparathyroidism of renal origin 04/24/2019   Localized edema 01/19/2019   Chronic gout of multiple sites 08/16/2018   Prostate cancer (HCC) 08/23/2015   Hyperlipidemia, mixed 07/16/2015   Essential (primary) hypertension 12/04/2014    Allergies  Allergen Reactions   Glimepiride  Other (See Comments)    Severe hypoglycemia   Other    Oxycodone Hcl Nausea And Vomiting    Past Surgical History:  Procedure Laterality Date   A/V FISTULAGRAM Left 05/30/2020   Procedure: A/V FISTULAGRAM;  Surgeon: Marea Selinda RAMAN, MD;  Location: [facility];  Service: Cardiovascular;  Laterality: Left;   AV FISTULA PLACEMENT Left 03/07/2020   Procedure: ARTERIOVENOUS (AV) FISTULA CREATION BRACHIAL CEPHALIC;  Surgeon: Marea Selinda RAMAN, MD;  Location: [facility];  Service: Vascular;  Laterality: Left;   DIALYSIS/PERMA CATHETER INSERTION N/A 01/17/2020   Procedure: DIALYSIS/PERMA CATHETER INSERTION;  Surgeon: Marea Selinda RAMAN, MD;  Location: [facility];  Service: Cardiovascular;  Laterality: N/A;   DIALYSIS/PERMA CATHETER REMOVAL N/A 10/07/2020   Procedure: DIALYSIS/PERMA CATHETER REMOVAL;  Surgeon: Marea Selinda RAMAN, MD;  Location: [facility];  Service: Cardiovascular;  Laterality: N/A;   PARTIAL COLECTOMY  08/11/2003   RADIOACTIVE SEED IMPLANT N/A 12/06/2017   Procedure: RADIOACTIVE SEED  IMPLANT/BRACHYTHERAPY IMPLANT;  Surgeon: Penne Knee, MD;  Location: [facility];  Service: Urology;  Laterality: N/A;   SMALL INTESTINE SURGERY  05/2004   UPPER EXTREMITY ANGIOGRAPHY Left 04/18/2020   Procedure: UPPER EXTREMITY ANGIOGRAPHY;  Surgeon: Marea Selinda RAMAN, MD;  Location: [facility];  Service: Cardiovascular;  Laterality: Left;    Social History   Tobacco Use   Smoking status: Never   Smokeless tobacco: Never  Vaping Use   Vaping status: Never Used  Substance Use Topics   Alcohol use: No   Drug use: No     Medication list has been reviewed and updated.  Current Meds  Medication Sig   amLODipine  (NORVASC ) 10 MG tablet 10 mg daily.   ASPIRIN  LOW DOSE 81 MG tablet TAKE 1 TABLET BY MOUTH DAILY   atorvastatin  (LIPITOR) 10 MG tablet Take 1 tablet (10 mg total) by mouth daily.   calcitRIOL  (ROCALTROL ) 0.25 MCG capsule Take 0.25 mcg by mouth 3 (three) times a week.   calcium  acetate (PHOSLO) 667 MG capsule Take 2,001 mg by mouth 3 (three) times daily.   carvedilol  (COREG ) 6.25 MG tablet Take 6.25 mg by mouth 2 (two) times daily with a meal.    cinacalcet  (SENSIPAR ) 30 MG tablet Take 30 mg by mouth 3 (three) times a week.   clopidogrel  (PLAVIX ) 75 MG tablet Take 75 mg by mouth daily.   furosemide (LASIX) 80 MG tablet Take 80 mg by mouth daily.   midodrine (PROAMATINE) 5 MG tablet Take 5 mg by mouth as needed.   XPHOZAH 30 MG TABS Take by mouth. (Patient taking differently: Take by mouth. Takes 1 tablet twice a day with Phoslo)       07/04/2024    8:39 AM 05/11/2024   10:59 AM 12/01/2023    8:36 AM 02/03/2023    1:41 PM  GAD 7 : Generalized Anxiety Score  Nervous, Anxious, on Edge 0 0 0 0  Control/stop worrying 0 0 0 0  Worry too much - different things 0 0 0 0  Trouble relaxing 0 0 0 0  Restless 0 0 0 0  Easily annoyed or irritable 0 0 0 0  Afraid - awful might happen 0 0 0 0  Total GAD 7 Score 0 0 0 0  Anxiety Difficulty Not difficult at all Not difficult at  all Not difficult at all Not difficult at all       07/04/2024    8:39 AM 05/11/2024   10:56 AM 03/02/2024    4:00 PM  Depression screen PHQ 2/9  Decreased Interest 0 0 0  Down, Depressed, Hopeless 0 0 0  PHQ - 2 Score 0 0 0  Altered sleeping  0 0  Tired, decreased energy  0 0  Change in appetite  0 0  Feeling bad or failure about yourself   0 0  Trouble concentrating  0 0  Moving slowly or fidgety/restless  0 0  Suicidal thoughts  0 0  PHQ-9 Score  0  0   Difficult doing work/chores  Not difficult at all Not difficult at all     Data saved with a previous flowsheet row definition    BP Readings from Last 3 Encounters:  07/04/24 104/86  05/11/24 138/76  12/01/23 114/72    Physical Exam Vitals and nursing note reviewed.  Constitutional:      General: He is not in acute distress.    Appearance: He is well-developed.  HENT:     Head: Normocephalic and atraumatic.  Cardiovascular:     Rate and Rhythm: Normal rate and regular rhythm.  Pulmonary:     Effort: Pulmonary effort is normal. No respiratory distress.     Breath sounds: No wheezing or rhonchi.  Musculoskeletal:     Right hip: Tenderness (mild discomfort laterally to internal rotation) and bony tenderness present. Normal range of motion.     Left hip: Normal.  Skin:    General: Skin is warm and dry.     Findings: No rash.  Neurological:     Mental Status: He is alert and oriented to person, place, and time.  Psychiatric:        Mood and Affect: Mood normal.        Behavior: Behavior normal.     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 Followed closely by Nephrology He has LD disability papers to be updated but no prior in chart  He will check to see if Nephrology completed these initially      Other Visit Diagnoses       Acute hip pain, right    -  Primary   suspect soft tissue strain Tylenol  650 mg tid; heat or ice       No follow-ups on file.    Leita HILARIO Adie, MD [facility] [place]

## 2024-07-05 DIAGNOSIS — Z992 Dependence on renal dialysis: Secondary | ICD-10-CM | POA: Diagnosis not present

## 2024-07-05 DIAGNOSIS — N186 End stage renal disease: Secondary | ICD-10-CM | POA: Diagnosis not present

## 2024-07-09 DIAGNOSIS — Z992 Dependence on renal dialysis: Secondary | ICD-10-CM | POA: Diagnosis not present

## 2024-07-09 DIAGNOSIS — N186 End stage renal disease: Secondary | ICD-10-CM | POA: Diagnosis not present

## 2024-08-22 ENCOUNTER — Other Ambulatory Visit (INDEPENDENT_AMBULATORY_CARE_PROVIDER_SITE_OTHER): Payer: Self-pay | Admitting: Nurse Practitioner

## 2024-08-25 NOTE — Telephone Encounter (Signed)
 He follows with [facility] now, should defer to them

## 2024-11-09 ENCOUNTER — Encounter: Admitting: Family Medicine

## 2025-03-15 ENCOUNTER — Ambulatory Visit
